# Patient Record
Sex: Female | Born: 1945 | Race: Black or African American | Hispanic: No | Marital: Married | Smoking: Former smoker
Health system: Southern US, Community
[De-identification: ages and names within clinical notes are randomized; demographics above are authoritative.]

## PROBLEM LIST (undated history)

## (undated) DIAGNOSIS — M5136 Other intervertebral disc degeneration, lumbar region: Secondary | ICD-10-CM

## (undated) DIAGNOSIS — M19042 Primary osteoarthritis, left hand: Secondary | ICD-10-CM

## (undated) DIAGNOSIS — T4145XA Adverse effect of unspecified anesthetic, initial encounter: Secondary | ICD-10-CM

## (undated) DIAGNOSIS — H05049 Tenonitis of unspecified orbit: Secondary | ICD-10-CM

## (undated) DIAGNOSIS — M7989 Other specified soft tissue disorders: Secondary | ICD-10-CM

## (undated) DIAGNOSIS — M19041 Primary osteoarthritis, right hand: Secondary | ICD-10-CM

## (undated) DIAGNOSIS — M797 Fibromyalgia: Secondary | ICD-10-CM

## (undated) DIAGNOSIS — R238 Other skin changes: Secondary | ICD-10-CM

## (undated) DIAGNOSIS — M7918 Myalgia, other site: Secondary | ICD-10-CM

## (undated) DIAGNOSIS — M1990 Unspecified osteoarthritis, unspecified site: Secondary | ICD-10-CM

## (undated) DIAGNOSIS — K219 Gastro-esophageal reflux disease without esophagitis: Secondary | ICD-10-CM

## (undated) DIAGNOSIS — F419 Anxiety disorder, unspecified: Secondary | ICD-10-CM

## (undated) DIAGNOSIS — J45909 Unspecified asthma, uncomplicated: Secondary | ICD-10-CM

## (undated) DIAGNOSIS — R7303 Prediabetes: Secondary | ICD-10-CM

## (undated) DIAGNOSIS — I10 Essential (primary) hypertension: Secondary | ICD-10-CM

## (undated) DIAGNOSIS — J302 Other seasonal allergic rhinitis: Secondary | ICD-10-CM

## (undated) DIAGNOSIS — M3500 Sicca syndrome, unspecified: Secondary | ICD-10-CM

## (undated) DIAGNOSIS — T8859XA Other complications of anesthesia, initial encounter: Secondary | ICD-10-CM

## (undated) DIAGNOSIS — R233 Spontaneous ecchymoses: Secondary | ICD-10-CM

## (undated) DIAGNOSIS — D649 Anemia, unspecified: Secondary | ICD-10-CM

## (undated) DIAGNOSIS — R0989 Other specified symptoms and signs involving the circulatory and respiratory systems: Secondary | ICD-10-CM

## (undated) DIAGNOSIS — R32 Unspecified urinary incontinence: Secondary | ICD-10-CM

## (undated) DIAGNOSIS — M170 Bilateral primary osteoarthritis of knee: Secondary | ICD-10-CM

## (undated) HISTORY — DX: Other skin changes: R23.8

## (undated) HISTORY — DX: Other seasonal allergic rhinitis: J30.2

## (undated) HISTORY — DX: Other specified soft tissue disorders: M79.89

## (undated) HISTORY — DX: Unspecified asthma, uncomplicated: J45.909

## (undated) HISTORY — DX: Other intervertebral disc degeneration, lumbar region: M51.36

## (undated) HISTORY — DX: Myalgia, other site: M79.18

## (undated) HISTORY — DX: Anxiety disorder, unspecified: F41.9

## (undated) HISTORY — DX: Primary osteoarthritis, right hand: M19.041

## (undated) HISTORY — DX: Tenonitis of unspecified orbit: H05.049

## (undated) HISTORY — PX: BACK SURGERY: SHX140

## (undated) HISTORY — PX: BREAST SURGERY: SHX581

## (undated) HISTORY — DX: Bilateral primary osteoarthritis of knee: M17.0

## (undated) HISTORY — DX: Other specified symptoms and signs involving the circulatory and respiratory systems: R09.89

## (undated) HISTORY — PX: SHOULDER SURGERY: SHX246

## (undated) HISTORY — DX: Spontaneous ecchymoses: R23.3

## (undated) HISTORY — DX: Primary osteoarthritis, left hand: M19.042

## (undated) HISTORY — DX: Essential (primary) hypertension: I10

## (undated) HISTORY — PX: BREAST CYST ASPIRATION: SHX578

## (undated) HISTORY — PX: ABDOMINAL HYSTERECTOMY: SHX81

---
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
## 1983-07-24 HISTORY — PX: BACK SURGERY: SHX140

## 1984-07-23 HISTORY — PX: ABDOMINAL HYSTERECTOMY: SHX81

## 1989-07-23 HISTORY — PX: KNEE SURGERY: SHX244

## 1999-07-24 HISTORY — PX: CARPAL TUNNEL RELEASE: SHX101

## 2000-09-19 ENCOUNTER — Encounter: Payer: Self-pay | Admitting: Family Medicine

## 2000-09-19 ENCOUNTER — Encounter: Admission: RE | Admit: 2000-09-19 | Discharge: 2000-09-19 | Payer: Self-pay | Admitting: Family Medicine

## 2001-11-19 ENCOUNTER — Encounter: Payer: Self-pay | Admitting: Obstetrics and Gynecology

## 2001-11-19 ENCOUNTER — Encounter: Admission: RE | Admit: 2001-11-19 | Discharge: 2001-11-19 | Payer: Self-pay | Admitting: Obstetrics and Gynecology

## 2002-01-06 ENCOUNTER — Other Ambulatory Visit: Admission: RE | Admit: 2002-01-06 | Discharge: 2002-01-06 | Payer: Self-pay | Admitting: Obstetrics and Gynecology

## 2002-01-29 ENCOUNTER — Encounter: Payer: Self-pay | Admitting: Family Medicine

## 2002-01-29 ENCOUNTER — Encounter: Admission: RE | Admit: 2002-01-29 | Discharge: 2002-01-29 | Payer: Self-pay | Admitting: Family Medicine

## 2003-01-20 ENCOUNTER — Encounter: Payer: Self-pay | Admitting: Family Medicine

## 2003-01-20 ENCOUNTER — Encounter: Admission: RE | Admit: 2003-01-20 | Discharge: 2003-01-20 | Payer: Self-pay | Admitting: Family Medicine

## 2003-02-09 ENCOUNTER — Other Ambulatory Visit: Admission: RE | Admit: 2003-02-09 | Discharge: 2003-02-09 | Payer: Self-pay | Admitting: Obstetrics and Gynecology

## 2004-02-08 ENCOUNTER — Encounter: Admission: RE | Admit: 2004-02-08 | Discharge: 2004-02-08 | Payer: Self-pay | Admitting: Family Medicine

## 2004-07-23 HISTORY — PX: TOTAL HIP ARTHROPLASTY: SHX124

## 2005-02-08 ENCOUNTER — Encounter: Admission: RE | Admit: 2005-02-08 | Discharge: 2005-02-08 | Payer: Self-pay | Admitting: Obstetrics and Gynecology

## 2005-06-08 ENCOUNTER — Ambulatory Visit (HOSPITAL_COMMUNITY): Admission: RE | Admit: 2005-06-08 | Discharge: 2005-06-08 | Payer: Self-pay | Admitting: Internal Medicine

## 2005-09-03 ENCOUNTER — Emergency Department (HOSPITAL_COMMUNITY): Admission: EM | Admit: 2005-09-03 | Discharge: 2005-09-04 | Payer: Self-pay | Admitting: Emergency Medicine

## 2005-09-03 ENCOUNTER — Encounter: Admission: RE | Admit: 2005-09-03 | Discharge: 2005-09-03 | Payer: Self-pay | Admitting: Orthopaedic Surgery

## 2006-01-30 ENCOUNTER — Ambulatory Visit (HOSPITAL_COMMUNITY): Admission: RE | Admit: 2006-01-30 | Discharge: 2006-01-30 | Payer: Self-pay | Admitting: Internal Medicine

## 2006-04-02 ENCOUNTER — Encounter: Admission: RE | Admit: 2006-04-02 | Discharge: 2006-04-02 | Payer: Self-pay | Admitting: Internal Medicine

## 2006-04-16 ENCOUNTER — Inpatient Hospital Stay (HOSPITAL_COMMUNITY): Admission: RE | Admit: 2006-04-16 | Discharge: 2006-04-19 | Payer: Self-pay | Admitting: Orthopaedic Surgery

## 2007-04-15 ENCOUNTER — Encounter: Admission: RE | Admit: 2007-04-15 | Discharge: 2007-04-15 | Payer: Self-pay | Admitting: Emergency Medicine

## 2007-05-01 ENCOUNTER — Encounter: Admission: RE | Admit: 2007-05-01 | Discharge: 2007-05-01 | Payer: Self-pay | Admitting: Emergency Medicine

## 2008-05-14 ENCOUNTER — Encounter: Admission: RE | Admit: 2008-05-14 | Discharge: 2008-05-14 | Payer: Self-pay | Admitting: Emergency Medicine

## 2009-05-16 ENCOUNTER — Encounter: Admission: RE | Admit: 2009-05-16 | Discharge: 2009-05-16 | Payer: Self-pay | Admitting: Internal Medicine

## 2009-06-20 ENCOUNTER — Other Ambulatory Visit: Admission: RE | Admit: 2009-06-20 | Discharge: 2009-06-20 | Payer: Self-pay | Admitting: Internal Medicine

## 2010-04-11 ENCOUNTER — Encounter: Admission: RE | Admit: 2010-04-11 | Discharge: 2010-04-11 | Payer: Self-pay | Admitting: Internal Medicine

## 2010-06-07 ENCOUNTER — Encounter: Admission: RE | Admit: 2010-06-07 | Discharge: 2010-06-07 | Payer: Self-pay | Admitting: Internal Medicine

## 2010-08-13 ENCOUNTER — Encounter: Payer: Self-pay | Admitting: Emergency Medicine

## 2010-08-14 ENCOUNTER — Encounter: Payer: Self-pay | Admitting: Emergency Medicine

## 2010-12-08 NOTE — Op Note (Signed)
Kathy Duran, Kathy Duran                ACCOUNT NO.:  [id number]   MEDICAL RECORD NO.:  [id number]          PATIENT TYPE:  INP   LOCATION:  5011                         FACILITY:  [facility]   PHYSICIAN:  Claude Manges. Whitfield, M.D.DATE OF BIRTH:  08/07/1945   DATE OF PROCEDURE:  04/16/2006  DATE OF DISCHARGE:                                 OPERATIVE REPORT   PREOPERATIVE DIAGNOSIS:  Osteoarthritis, right hip.   POSTOPERATIVE DIAGNOSIS:  Osteoarthritis, right hip.   PROCEDURE:  Right total hip replacement.   SURGEON:  Claude Manges. Cleophas Dunker, M.D.   ASSISTANT:  Arnoldo Morale, [facility]   ANESTHESIA:  General orotracheal.   COMPLICATIONS:  None.   COMPONENTS:  DePuy AML 52 mm outer diameter, 100 series, metallic acetabular  cup with Apex hole eliminator, the Marathon polyethylene liner, 32 mm outer  diameter hip ball with a +1 mm neck length and a small stature 12 mm femoral  component.  All were press fit.   PROCEDURE:  With the patient comfortable on the operating table and under  general orotracheal anesthesia, nursing staff inserted a Foley catheter.  The patient was then placed in the lateral decubitus position with the right  side up and secured to the operating table with the Innomed hip system.   The right hip was then prepped with Betadine scrub and DuraPrep from the  iliac crest to the midcalf.  Sterile draping was performed.   A routine Southern incision was utilized and via sharp dissection, carried  down subcutaneous tissue. There was abundant adipose tissue which made the  procedure technically difficult.  There was probably least 4 inches of  adipose before we encountered the iliotibial band.  Self-retaining  retractors were inserted.  Gross bleeders were Bovie coagulated.  Iliotibial  band was then incised along the length of the skin incision.  Retractors  were placed more deeply.   The short external rotators were identified.  They were carefully incised  from their posterior  attachment of the greater trochanter.  Tendinous  structures were tagged with 0 Ethibond suture.  They were retracted from the  capsule which was then easily visualized.  The capsule was incised along the  femoral neck and head with probably 4-5 mL of clear yellow joint effusion.   The head was then dislocated posteriorly.  There were large areas of  articular cartilage loss.  On x-ray there was some subchondral cysts.   Using the AML guide the femoral neck was then osteotomized with oscillating  saw.  The central hole was then made in the piriformis fossa.  Reaming was  performed to 11.5 mm to accept a 12 mm prosthesis.  Rasping was performed  using a 10.5 then a 12 mm rasp. A calcar reamer was used to obtain the  appropriate angle on the calcar.  The level of the osteotomy was  approximately a fingerbreadth above the lesser trochanter.  We had a very  nice fit with the broach fitting flush on the calcar.   Retractor was then placed about the acetabulum.  The labrum was sharply  excised.  There were  thin osteophytes circumferentially.  They were loose  and these were removed.  Reaming was performed sequentially to 51 mm to  accept a 52 mm prosthesis.  We had very nice bleeding circumferentially  within the acetabulum.  I trialed a 50 mm prosthesis.  I had excellent rim  fit with seat.   We accordingly, utilized a 52 mm outer diameter 100 series AML acetabular  component.  We impacted it flush into the acetabulum.  We then inserted the  trial polyethylene liner.  The femoral broach was then inserted followed by  the 32 mm hip ball with a +1 neck.  The entire construct was reduced with  excellent motion, there was no subluxation in flexion extension, internal,  external rotation.  I felt the leg lengths were symmetrical.   Trial components were removed.  The joint was then copiously irrigated with  saline solution.  The apex hole eliminator was inserted followed by the  final Marathon  polyethylene liner.  The femoral canal was then irrigated.  The 12 mm femoral component was then impacted onto the calcar.  The Morse  taper neck was cleaned and the +1 neck length 32-mm hip ball was then  applied.  The acetabulum was inspected, it was clear of any loose material.  The head was then reduced and again through a full range of motion there was  no instability and leg lengths appeared to be symmetrical.   Wound was again irrigated with saline solution.  The capsule was closed with  0 Ethibond.  The short external rotators closed with a similar material.  The iliotibial band was closed with running 0 Vicryl.  We closed the subcu  with numerous layers of 0 and 2-0 Vicryl, skin closed with skin clips.  Sterile bulky dressing was applied.  The patient was then placed supine on  the operating room stretcher, knee immobilizer was placed on the right lower  extremity.  There was excellent pulses.   The patient tolerated without complications.      Claude Manges. Cleophas Dunker, M.D.  Electronically Signed     PWW/MEDQ  D:  04/16/2006  T:  04/18/2006  Job:  [id number]

## 2010-12-08 NOTE — H&P (Signed)
Kathy Duran, Kathy Duran                ACCOUNT NO.:  [id number]   MEDICAL RECORD NO.:  [id number]          PATIENT TYPE:  INP   LOCATION:  NA                           FACILITY:  [facility]   PHYSICIAN:  Claude Manges. Whitfield, M.D.DATE OF BIRTH:  [birth year]   DATE OF ADMISSION:  DATE OF DISCHARGE:                                HISTORY & PHYSICAL   CHIEF COMPLAINT:  Right hip pain.   HISTORY OF THE PRESENT ILLNESS:  Kathy Duran is a 65 year old African-  American female with right hip pain for two years with no known injury.  The  pain is now described as intermittent, moderate-to-severe pain in the right  lateral aspect of the hip and in the groin.  There is no radiation up or  down the leg.  The patient has failed conservative treatment, which included  intra-articular injection to the right hip.  She uses pain medication on an  as needed basis.  Mechanical symptoms __________  for give away due pain  with prolonged walking.  X-rays of the right hip shows decreased joint space  in the femoral and acetabular aspects of the right hip.   ALLERGIES:  The patient has DRUG ALLERGY TO SULFA, WHICH CAUSES A RASH, E-  MYCIN INTOLERANCE CAUSES UPSET STOMACH, CIPRO CAUSES A RASH. MOBIC AND  ARTHROTEC BOTH CAUSE A RASH, AND VIOXX CAUSES GASTROINTESTINAL UPSET.  Otherwise there are no metal or food allergies noted.   MEDICATIONS:  1. Zocor 40 mg 1 daily.  2. Cozaar 50 mg 1 daily.  3. Aspirin 81 mg 1 daily; stopped on April 10, 2006.  4. Advair 100/50 twice a day.  5. Nasonex two puffs once daily.  6. Albuterol inhaler 90 mEq as needed.  7. Villadot 0.1 mg twice weekly.  8. Singulair 10 mg once daily.  9. Pepcid 40 mg 1 daily up to twice daily p.r.n.  10.Claritin 1 daily up to twice daily.  11.The patient was placed on Toprol, she is unsure of the dosage, in the      perioperative period.   PAST SURGICAL HISTORY:  1. Hysterectomy in 1986.  2. In 1987 hemilaminectomy by Dr. Cleophas Dunker of the  lumbar spine.  3. Removal of a cyst from the right ovary in 1994.  4. In 1996 right knee arthroscopy by Dr. Cleophas Dunker.  5. In 2001 the patient underwent a right shoulder arthroscopy with      subacromial decompression, distal clavicle resection and repair of      anterior glenoid labral tear.   The patient denies any complications with the above procedures and has had  blood transfusions.   SOCIAL HISTORY:  The patient has a remote history of smoking; she stopped  smoking 38 years ago.  No alcohol use.  She lives in a [place] resident  with two steps to the regular entrance. The patient lives with one other  person.   PRIMARY CARE PHYSICIAN:  Margaretmary Bayley, M.D.   PRIMARY CARDIOLOGIST:  Cristy Hilts. Jacinto Halim, M.D.   PULMONOLOGIST:  Rennis Chris. Maple Hudson, M.D., [facility], FACP   FAMILY HISTORY:  The patient's mother,  age 51, has a history of an  myocardial infarction, hypertension, diabetes, and history of a hip  replacement with postoperative myocardial infarction.  Father is age 54 and  has hypertension and diabetes.  She has one brother who is deceased at age  31 due to emphysema, lung disease,  and had a myocardial infarction and this  is felt to be the cause of his demise.  He was reportedly exposed to agent  orange and had multiple medical issues related to this exposure.  She has  one sister who is age 26 and has hypertension and diabetes.   REVIEW OF SYSTEMS:  The patient has hoarseness, which she relates to her  allergies.  Her shortness of breath is well controlled.  She has a history  of bronchitis, but this is well-controlled.  Her hypertension is well  controlled.  She suffers from gastric reflux, but this is controlled with  medications.  She has a history of spastic colon, which is remote.  The  patient has asthma that is well controlled.  She bruises easily and has skin  rashes periodically secondary to certain foods, and sun exposure, which  causes skin rashes secondary to her  medications.  Otherwise the review of  systems if negative or noncontributory.  The patient denies any recent flu  or fever.  No chest pain or shortness of breath.   PHYSICAL EXAMINATION:  GENERAL APPEARANCE: The patient is a well-developed,  well-nourished female in no acute distress.  She walks without any assistive  devices.  She has a slight antalgic gait and a limp on the right.  The  patient's mood and affect are appropriate.  She talks easily with the  examiner.  HEART:  Cardiac shows regular rate and rhythm.  No murmurs, rubs or gallops  noted.  CHEST AND LUNGS:  The chest is clear to auscultation bilaterally.  No  wheezing, rhonchi or rales  noted.  ABDOMEN:  The abdomen is soft and nontender with bowel sounds times four  quadrants.  No hepatomegaly and no splenomegaly.  HEENT:  The head is normocephalic. There is no frontal or maxillary  tenderness to palpation.  Conjunctivae are pink.  Sclerae is nonicteric.  Pupils equal, round and react to light and accommodation.  Extraocular  muscles are intact.  No visible external ear deformities.  Tympanic  membranes are pearly and gray bilaterally.  Nose; nasal septum is midline.  Nasal mucosa is pink and moist without polyps.  Buccal mucosa is pink and  moist.  The patient has good dentition.  Pharynx is without erythema or  exudate.  Tongue and uvula are midline.  NECK:  The neck has no lymphadenopathy.  Carotids are 2+ and without bruits.  Trachea is midline.  No tenderness with palpation along the cervical spine.  The patient has full range of motion of the cervical spine without pain or  radicular symptoms.  BACK:  The back has no tenderness to palpation over the thoracic or lumbar  spine.  BREASTS, GENITALIA AND RECTAL:  The breast, genital and rectal examinations  deferred at the present time.  NEUROLOGIC EXAMINATION:  Neurologically the patient is alert and oriented times three.  Cranial nerves II-XII are grossly intact.   Lower extremity  testing reveals 5/5 strength throughout.  MUSCULOSKELETAL:  The upper extremities are equal and symmetric in size and  shape.  The patient has from in her shoulders, elbows, wrists, and hands are  without pain.  Left radial pulses are 2+  and right radial pulses are 1+.  Now __________  capillary refill is less than two seconds bilaterally.  EXTREMITIES:  Lower extremities; the patient has full range of motion of the  left hip.  The right hip has limited range of motion with zero degrees of  internal rotation and external rotation is 20 degrees.  She has flexion of  both hips to 90 degrees, although it is somewhat painful for her to flex the  right hip to 90 degrees.  She has full range of motion of both knees.  No  effusion or edema is noted in either knee.  Calves are nontender  bilaterally.  Dorsalis pedis pulses are 2+ bilaterally and she has good  sensation to the toes bilaterally throughout.   ANCILLARY DATA:  X-rays of the right hip showed decreased joint space with  cystic formation in both the femoral and acetabular aspects of the hip.   IMPRESSION:  1. Right hip osteoarthritis.  2. Hypertension.  3. Hyperlipidemia.  4. Gastroesophageal reflux disease.  5. Minimal obstructive airway disease.  6. Seasonal allergies.   PLAN:  The patient is to undergo preoperative laboratories and testing prior  to surgery.  The patient did undergo a cardiac workup by Dr. Yates Decamp of  the [facility], and was cleared for surgery from  a cardiac standpoint.  The patient was also seen by Dr. Fannie Knee to  assess her pulmonary status prior to surgery and she was cleared for surgery  from a pulmonary standpoint.   The patient is to undergo a right total hip arthroplasty on April 15, 2006 by Dr. Norlene Campbell.      Richardean Canal, P.A.      Claude Manges. Cleophas Dunker, M.D.  Electronically Signed    GC/MEDQ  D:  04/10/2006  T:  04/12/2006   Job:  [id number]   cc:   Margaretmary Bayley, M.D.

## 2010-12-08 NOTE — Discharge Summary (Signed)
NAMEMARCY, Duran                ACCOUNT NO.:  [id number]   MEDICAL RECORD NO.:  [id number]          PATIENT TYPE:  INP   LOCATION:  5011                         FACILITY:  [facility]   PHYSICIAN:  Kathy Duran, P.A.-C.DATE OF BIRTH:  02-Jul-1946   DATE OF ADMISSION:  04/16/2006  DATE OF DISCHARGE:  04/19/2006                                 DISCHARGE SUMMARY   ADMISSION DIAGNOSIS:  Osteoarthritis of the right hip.   DISCHARGE DIAGNOSES:  1. Osteoarthritis of the right hip.  2. History of hypertension.  3. Hyperlipidemia.  4. Gastroesophageal reflux disease.  5. Minimal obstructive airway disease.  6. Allergic rhinitis.   PROCEDURE:  Right total hip arthroplasty.   HISTORY:  Kathy Duran is a 65 year old African American female with right hip  pain for 2 years without a history of injury or trauma.  The pain is now  described as an intermittent pain, which is moderate to severe in the right  lateral aspect of the hip and in the groin.  There is no radiation up or  down the leg.  The patient has failed conservative treatment, which included  intraarticular injections to the right hip and the use of pain medicine on a  p.r.n. basis.  She has mechanical symptoms with some giving way due to pain  with prolonged walking.  X-rays revealed decreased joint space in the  femoral acetabular aspect of the right hip.  She is indicated now for a  right total hip arthroplasty.   HOSPITAL COURSE:  A 65 year old female admitted on April 16, 2006 after  appropriate laboratory studies were obtained, as well as 1 gram Ancef IV on  call to the operating room.  She was taken to the operating room, where she  underwent a right total hip arthroplasty by Dr. Cleophas Dunker with Arnoldo Morale  P.A.-C assisting.  She tolerated the procedure well.  She was continued  postoperatively on Ancef 1 gram IV q.8 h. x3 doses.  A PCA pump was used  with Dilaudid in a full-dose protocol.  Heparin 3000 units subcu was  started  q.12 h. until the Coumadin became therapeutic.  A Foley was placed  intraoperatively.  Consultations with PT, OT, and care management were made.  She may be partial weightbearing 50% of body weight.  She was allowed out of  bed to a chair the following day.  She was transfused 1 unit of packed cells  during her initial postop course.  On April 17, 2006, she was weaned to  oral pain meds and the PCA was discontinued.  Incentive spirometry was began  at q.1 h.  Saline lock to the IV once the block was given on April 17, 2006.  On April 18, 2006, her Foley was discontinued.  Chest x-ray was  ordered because of temperature elevation.  Respiratory therapy for pulmonary  toilet.  Albuterol nebulizer q.6 h. x24.  Knee immobilizer is used only when  in bed.  She was allowed to use her own Advair.  A urine for UA was obtained  on April 18, 2006 also.  The remainder  of her hospital course was  uneventful.  Her INR became prophylactic and her heparin was discontinued on  April 19, 2006.  A chest x-ray was ordered on April 19, 2006 also.  She did well, became afebrile, and was discharged on April 19, 2006 to  return back to the office in 10-14 days for recheck evaluation.   LABORATORY DATA:  Radiographic studies of April 16, 2006 reveal a right  hip with total hip arthroplasty without complications.  Chest x-ray of  April 18, 2006 reveals no acute disease.  Chest x-ray of April 19, 2006, again, no active pulmonary disease.  Hemoglobin 13.5; hematocrit 39.7;  white count 5,200; platelets 276,000.  Discharge hemoglobin 9.3; hematocrit  26.5%; white count 7,000; platelets 479,000.  INR at the time of discharge  was 2.0.  Electrolytes revealed sodium 141, potassium 3.9, chloride 107, CO2  28, glucose 85, BUN 16, creatinine 0.9, calcium 9.3, total protein 6.4,  albumin 3.8, AST 22, ALT 23, ALP 57, total bilirubin 0.6.  This was  preoperatively.  Discharge  sodium 140, potassium 3.6, chloride 104, CO2 29,  glucose 100, BUN 3, creatinine 0.7, calcium 8.5.  Urinalysis on April 11, 2006 revealed benign and on April 18, 2006 was also benign.  Blood  type is 0 positive.  Antibody screen negative.  Given 1 unit of packed  cells.  Urine culture of April 11, 2006 showed insignificant growth.  Culture of April 18, 2006 revealed greater than 100,000 colonies of  proteus mirabilis and E. coli.   DISCHARGE INSTRUCTIONS:  There is no restriction in diet.  Activity as  tolerated.  No driving for 6 weeks or lifting for 6 weeks.  Ambulate with a  walker 50% weightbearing on her leg.  No shower.  May bath on Friday.  Keep  wound clean and dry.  Change dressing daily.  Percocet 5/325 mg 1-2 tablets  every 4 hours as needed for pain, Coumadin 5 mg instructed __________ the  patient to see pharmacist, Coreg 10 mg twice a day.  Follow up with Dr.  Cleophas Dunker 2 weeks postop.  Follow the instruction sheet and take her  temperature 4 times a day.  Call if greater than 100.5.  She was discharged  in improved condition.      Kathy Drone Duran, P.A.-C.     BDP/MEDQ  D:  05/08/2006  T:  05/09/2006  Job:  [id number]

## 2011-06-05 ENCOUNTER — Other Ambulatory Visit: Payer: Self-pay | Admitting: Internal Medicine

## 2011-06-05 DIAGNOSIS — Z1231 Encounter for screening mammogram for malignant neoplasm of breast: Secondary | ICD-10-CM

## 2011-06-13 ENCOUNTER — Ambulatory Visit
Admission: RE | Admit: 2011-06-13 | Discharge: 2011-06-13 | Disposition: A | Discharge status: Home / Self Care | Payer: BC Managed Care – PPO | Source: Ambulatory Visit | Attending: Internal Medicine | Admitting: Internal Medicine

## 2011-06-13 ENCOUNTER — Other Ambulatory Visit: Payer: Self-pay | Admitting: Internal Medicine

## 2011-06-13 DIAGNOSIS — R109 Unspecified abdominal pain: Secondary | ICD-10-CM

## 2011-07-03 ENCOUNTER — Ambulatory Visit: Payer: Self-pay

## 2011-07-03 ENCOUNTER — Ambulatory Visit
Admission: RE | Admit: 2011-07-03 | Discharge: 2011-07-03 | Disposition: A | Discharge status: Home / Self Care | Payer: BC Managed Care – PPO | Source: Ambulatory Visit | Attending: Internal Medicine | Admitting: Internal Medicine

## 2011-07-03 DIAGNOSIS — Z1231 Encounter for screening mammogram for malignant neoplasm of breast: Secondary | ICD-10-CM

## 2011-12-26 ENCOUNTER — Other Ambulatory Visit (HOSPITAL_COMMUNITY): Payer: Self-pay | Admitting: Internal Medicine

## 2011-12-26 DIAGNOSIS — J45909 Unspecified asthma, uncomplicated: Secondary | ICD-10-CM

## 2012-01-02 ENCOUNTER — Ambulatory Visit (HOSPITAL_COMMUNITY)
Admission: RE | Admit: 2012-01-02 | Discharge: 2012-01-02 | Disposition: A | Discharge status: Home / Self Care | Payer: BC Managed Care – PPO | Source: Ambulatory Visit | Attending: Internal Medicine | Admitting: Internal Medicine

## 2012-01-02 DIAGNOSIS — J45909 Unspecified asthma, uncomplicated: Secondary | ICD-10-CM | POA: Insufficient documentation

## 2012-01-02 MED ORDER — ALBUTEROL SULFATE (5 MG/ML) 0.5% IN NEBU
2.5000 mg | INHALATION_SOLUTION | Freq: Once | RESPIRATORY_TRACT | Status: AC
  Administered 2012-01-02: 2.5 mg via RESPIRATORY_TRACT

## 2012-06-10 ENCOUNTER — Other Ambulatory Visit: Payer: Self-pay | Admitting: Internal Medicine

## 2012-06-10 DIAGNOSIS — Z1231 Encounter for screening mammogram for malignant neoplasm of breast: Secondary | ICD-10-CM

## 2012-07-18 ENCOUNTER — Ambulatory Visit
Admission: RE | Admit: 2012-07-18 | Discharge: 2012-07-18 | Disposition: A | Discharge status: Home / Self Care | Payer: Self-pay | Source: Ambulatory Visit | Attending: Internal Medicine | Admitting: Internal Medicine

## 2012-07-18 ENCOUNTER — Ambulatory Visit: Payer: BC Managed Care – PPO

## 2012-07-18 DIAGNOSIS — Z1231 Encounter for screening mammogram for malignant neoplasm of breast: Secondary | ICD-10-CM

## 2012-07-28 ENCOUNTER — Other Ambulatory Visit: Payer: Self-pay | Admitting: Internal Medicine

## 2012-07-28 DIAGNOSIS — R928 Other abnormal and inconclusive findings on diagnostic imaging of breast: Secondary | ICD-10-CM

## 2012-08-01 ENCOUNTER — Ambulatory Visit
Admission: RE | Admit: 2012-08-01 | Discharge: 2012-08-01 | Disposition: A | Discharge status: Home / Self Care | Payer: BC Managed Care – PPO | Source: Ambulatory Visit | Attending: Internal Medicine | Admitting: Internal Medicine

## 2012-08-01 DIAGNOSIS — R928 Other abnormal and inconclusive findings on diagnostic imaging of breast: Secondary | ICD-10-CM

## 2013-05-06 ENCOUNTER — Ambulatory Visit (INDEPENDENT_AMBULATORY_CARE_PROVIDER_SITE_OTHER): Payer: Medicare Other

## 2013-05-06 ENCOUNTER — Encounter: Payer: Self-pay | Admitting: Podiatry

## 2013-05-06 ENCOUNTER — Ambulatory Visit (INDEPENDENT_AMBULATORY_CARE_PROVIDER_SITE_OTHER): Payer: Medicare Other | Admitting: Podiatry

## 2013-05-06 VITALS — BP 147/84 | HR 92 | Resp 17 | Wt 195.0 lb

## 2013-05-06 DIAGNOSIS — G8929 Other chronic pain: Secondary | ICD-10-CM

## 2013-05-06 DIAGNOSIS — M79609 Pain in unspecified limb: Secondary | ICD-10-CM

## 2013-05-06 DIAGNOSIS — M779 Enthesopathy, unspecified: Secondary | ICD-10-CM

## 2013-05-06 DIAGNOSIS — R238 Other skin changes: Secondary | ICD-10-CM | POA: Insufficient documentation

## 2013-05-06 DIAGNOSIS — M7989 Other specified soft tissue disorders: Secondary | ICD-10-CM | POA: Insufficient documentation

## 2013-05-06 DIAGNOSIS — M2040 Other hammer toe(s) (acquired), unspecified foot: Secondary | ICD-10-CM

## 2013-05-06 DIAGNOSIS — M2010 Hallux valgus (acquired), unspecified foot: Secondary | ICD-10-CM

## 2013-05-06 MED ORDER — TRIAMCINOLONE ACETONIDE 10 MG/ML IJ SUSP
5.0000 mg | Freq: Once | INTRAMUSCULAR | Status: AC
  Administered 2013-05-06: 5 mg via INTRA_ARTICULAR

## 2013-05-06 NOTE — Progress Notes (Signed)
  Subjective:    Patient ID: Kathy Duran, female    DOB: August 19, 1945, 67 y.o.   MRN: [id number]  HPIN L FOOT CRAMPING       L L 2ND MPJ AREAR       D 4 MONTHS       O GRADUAL       C SHARP INITIALLY, BUT EASES AS WALK       A  INITIAL WALKING       T PRESCRIPTION ORTHOTICS 2006, CHANGE SHOE STYLE, WATER MASSAGE    Review of Systems  Constitutional: Positive for activity change.  HENT: Negative.   Eyes: Negative.   Respiratory: Positive for chest tightness and wheezing.   Cardiovascular: Positive for leg swelling.  Gastrointestinal: Negative.   Endocrine: Negative.   Genitourinary: Negative.   Musculoskeletal: Negative.   Skin: Negative.   Allergic/Immunologic: Positive for environmental allergies.  Neurological: Negative.   Hematological: Negative.   Psychiatric/Behavioral: Negative.        Objective:   Physical Exam        Assessment & Plan:

## 2013-05-06 NOTE — Patient Instructions (Signed)
We will call when orthotics are ready 

## 2013-05-07 ENCOUNTER — Ambulatory Visit: Payer: Self-pay | Admitting: Podiatry

## 2013-05-07 NOTE — Progress Notes (Signed)
Subjective:     Patient ID: Kathy Duran, female   DOB: 08-Dec-1945, 67 y.o.   MRN: [id number]  Toe Pain    patient presents complaining of pain in her forefoot left of several months duration. States it's worse when she steps down on it or after walking for a period of time   Review of Systems  All other systems reviewed and are negative.       Objective:   Physical Exam  Nursing note and vitals reviewed. Constitutional: She appears well-developed and well-nourished.  Cardiovascular: Intact distal pulses.   Musculoskeletal: Normal range of motion.  Neurological: She is alert.  Skin: Skin is warm.   patient has normal muscle strength and is noted to have forefoot structural changes left with elevation of the second toe and structural bunion deformity noted. Acute inflammation and pain around the second MPJ left. 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 Explained structural malalignment and today focused on the second MPJ joint. Today proximal nerve block and then aspirated the joint getting out a small amount of fluid I then went ahead and injected with half cc of dexamethasone Kenalog combination and applied thick plantar padding. Scanned for custom orthotics to reduce forefoot stresses and provide better arch support

## 2013-06-03 ENCOUNTER — Ambulatory Visit (INDEPENDENT_AMBULATORY_CARE_PROVIDER_SITE_OTHER): Payer: Medicare Other | Admitting: Podiatry

## 2013-06-03 ENCOUNTER — Encounter: Payer: Self-pay | Admitting: Podiatry

## 2013-06-03 VITALS — BP 120/80 | HR 114 | Resp 16

## 2013-06-03 DIAGNOSIS — M779 Enthesopathy, unspecified: Secondary | ICD-10-CM

## 2013-06-03 DIAGNOSIS — M2040 Other hammer toe(s) (acquired), unspecified foot: Secondary | ICD-10-CM

## 2013-06-03 DIAGNOSIS — M2010 Hallux valgus (acquired), unspecified foot: Secondary | ICD-10-CM

## 2013-06-03 NOTE — Progress Notes (Signed)
Subjective:     Patient ID: Kathy Duran, female   DOB: 1946-02-27, 67 y.o.   MRN: [id number]  HPI patient presents stating my toe is feeling better I still have some pain. Presents with old orthotics and comes today to pick up a new orthotic devices.    Review of Systems     Objective:   Physical Exam  Nursing note and vitals reviewed. Constitutional: She is oriented to person, place, and time.  Cardiovascular: Intact distal pulses.   Musculoskeletal: Normal range of motion.  Neurological: She is oriented to person, place, and time.   patient does have structural imbalance left foot is continued prominence of the second metatarsal head and mild inflammation and discomfort when the second MPJ is palpated    Assessment:     Improving capsulitis left still present was structural HAV hammertoe deformity    Plan:     Orthotics dispensed with instructions and discussed possible re\re doing her second pair which she will bring with her in 4 weeks and we will make that decision

## 2013-06-03 NOTE — Patient Instructions (Signed)

## 2013-06-30 ENCOUNTER — Other Ambulatory Visit: Payer: Self-pay

## 2013-06-30 DIAGNOSIS — Z1231 Encounter for screening mammogram for malignant neoplasm of breast: Secondary | ICD-10-CM

## 2013-07-02 ENCOUNTER — Ambulatory Visit: Payer: Medicare Other | Admitting: Podiatry

## 2013-07-08 ENCOUNTER — Ambulatory Visit: Payer: Medicare Other | Admitting: Podiatry

## 2013-08-03 ENCOUNTER — Ambulatory Visit: Payer: BC Managed Care – PPO

## 2013-08-26 ENCOUNTER — Ambulatory Visit (INDEPENDENT_AMBULATORY_CARE_PROVIDER_SITE_OTHER): Payer: Medicare Other | Admitting: Podiatry

## 2013-08-26 ENCOUNTER — Encounter: Payer: Self-pay | Admitting: Podiatry

## 2013-08-26 VITALS — BP 131/76 | HR 99 | Resp 17 | Ht 65.0 in | Wt 198.0 lb

## 2013-08-26 DIAGNOSIS — M7750 Other enthesopathy of unspecified foot: Secondary | ICD-10-CM

## 2013-08-26 NOTE — Progress Notes (Signed)
Pt states it's like the orthotics are changing my gait and the weight is put on my left groin area.

## 2013-08-27 NOTE — Progress Notes (Signed)
Subjective:     Patient ID: Kathy Duran, female   DOB: 1945/12/25, 68 y.o.   MRN: [id number]  HPI patient states that the orthotics feel good but she feels like it may be putting some stress into her left groin area   Review of Systems     Objective:   Physical Exam Neurovascular status intact with no health history issues noted and well fitted orthotics that are actually less rigid than her previous pair    Assessment:     May be developing mild tendinitis symptoms or it may be coincidental is the pain in her left groin    Plan:     At this time we will gradually increase the orthotic usage and I explained how to do this. Patient will be seen back to recheck again in 4 weeks

## 2013-10-29 ENCOUNTER — Encounter: Payer: Self-pay | Admitting: Podiatry

## 2013-10-29 ENCOUNTER — Ambulatory Visit (INDEPENDENT_AMBULATORY_CARE_PROVIDER_SITE_OTHER): Payer: Medicare Other | Admitting: Podiatry

## 2013-10-29 VITALS — BP 123/71 | HR 92 | Resp 16

## 2013-10-29 DIAGNOSIS — M2010 Hallux valgus (acquired), unspecified foot: Secondary | ICD-10-CM

## 2013-10-29 DIAGNOSIS — M7750 Other enthesopathy of unspecified foot: Secondary | ICD-10-CM

## 2013-10-29 MED ORDER — TRIAMCINOLONE ACETONIDE 10 MG/ML IJ SUSP
10.0000 mg | Freq: Once | INTRAMUSCULAR | Status: AC
  Administered 2013-10-29: 10 mg

## 2013-10-30 NOTE — Progress Notes (Signed)
Subjective:     Patient ID: Kathy Duran, female   DOB: [birth year], 68 y.o.   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

## 2013-11-20 ENCOUNTER — Encounter: Payer: Self-pay | Admitting: Internal Medicine

## 2013-11-20 ENCOUNTER — Ambulatory Visit (INDEPENDENT_AMBULATORY_CARE_PROVIDER_SITE_OTHER): Payer: Medicare Other | Admitting: Internal Medicine

## 2013-11-20 VITALS — BP 106/74 | HR 89 | Ht 65.0 in | Wt 196.6 lb

## 2013-11-20 DIAGNOSIS — M7989 Other specified soft tissue disorders: Secondary | ICD-10-CM

## 2013-11-20 DIAGNOSIS — Z96649 Presence of unspecified artificial hip joint: Secondary | ICD-10-CM

## 2013-11-20 DIAGNOSIS — M5432 Sciatica, left side: Secondary | ICD-10-CM | POA: Insufficient documentation

## 2013-11-20 DIAGNOSIS — M545 Low back pain, unspecified: Secondary | ICD-10-CM

## 2013-11-20 DIAGNOSIS — I10 Essential (primary) hypertension: Secondary | ICD-10-CM | POA: Insufficient documentation

## 2013-11-20 DIAGNOSIS — R0602 Shortness of breath: Secondary | ICD-10-CM

## 2013-11-20 DIAGNOSIS — R768 Other specified abnormal immunological findings in serum: Secondary | ICD-10-CM | POA: Insufficient documentation

## 2013-11-20 DIAGNOSIS — Z79899 Other long term (current) drug therapy: Secondary | ICD-10-CM

## 2013-11-20 DIAGNOSIS — R609 Edema, unspecified: Secondary | ICD-10-CM

## 2013-11-20 DIAGNOSIS — R600 Localized edema: Secondary | ICD-10-CM

## 2013-11-20 DIAGNOSIS — N3289 Other specified disorders of bladder: Secondary | ICD-10-CM | POA: Insufficient documentation

## 2013-11-20 DIAGNOSIS — E785 Hyperlipidemia, unspecified: Secondary | ICD-10-CM

## 2013-11-20 DIAGNOSIS — Z96641 Presence of right artificial hip joint: Secondary | ICD-10-CM

## 2013-11-20 DIAGNOSIS — M069 Rheumatoid arthritis, unspecified: Secondary | ICD-10-CM

## 2013-11-20 DIAGNOSIS — K219 Gastro-esophageal reflux disease without esophagitis: Secondary | ICD-10-CM

## 2013-11-20 NOTE — Patient Instructions (Signed)
Your physician has requested that you have a lower extremity venous duplex. This test is an ultrasound of the veins in the legs. It looks at venous blood flow that carries blood from the heart to the legs. Allow one hour for a Lower Venous exam. There are no restrictions or special instructions. ** lower extremity venous complete - venous reflux  Your physician recommends that you return for lab work a few days to a week - you will need to be fasting.   Your physician recommends that you schedule a follow-up appointment in: 1 month with Dr. Rennis Golden

## 2013-11-20 NOTE — Progress Notes (Signed)
OFFICE NOTE  Chief Complaint:  Leg swelling  Primary Care Physician: Georgann Housekeeper, MD  HPI:  Kathy Duran is a pleasant 68 year old female with a history of premature arthritis, hypertension, dyslipidemia, gout bladder spasm, GERD and low back pain. She's also had right hip surgery.  She is a self-referral for evaluation of lower stringy swelling. She previously seen Dr. Newt Lukes in 2007 in May that a stress test at that time. She's managed to use some compression stockings for her swelling which worked. She reports the swelling is gotten worse during the day which is on her feet but improves after she elevates it at night. She's had a limited workup of her swelling before occluding venous Dopplers which showed no evidence of a DVT in 2011. She denies any varicose veins. She has been having some problems with sores on her feet. She is seeing Dr. Dellia Nims in the [facility] with center. She denies any shortness of breath or chest pain. She has no orthopnea or PND.  PMHx:  Past Medical History  Diagnosis Date  . Sinus complaint   . Bilateral swelling of feet   . Hypertension   . Asthma   . Bruises easily   . Anxiety     Past Surgical History  Procedure Laterality Date  . Total hip arthroplasty Right 2006  . Shoulder surgery Right   . Back surgery    . Abdominal hysterectomy      FAMHx:  Family History  Problem Relation Age of Onset  . Heart attack Father   . Diabetes Father   . Cancer Father   . Hypertension Father   . Arrhythmia Mother   . Stroke Mother   . Hyperlipidemia Mother   . Diabetes Mother   . Hypertension Mother   . Hyperlipidemia Maternal Grandmother   . Hypertension Maternal Grandmother   . Heart attack Paternal Grandfather   . Stroke Paternal Grandfather   . Asthma Brother   . Emphysema Brother   . Hypertension Brother   . Diabetes Sister   . Hypertension Sister   . Hyperlipidemia Sister     SOCHx:   reports that she quit smoking about 30 years ago.  She has never used smokeless tobacco. She reports that she does not drink alcohol. Her drug history is not on file.  ALLERGIES:  No Known Allergies  ROS: A comprehensive review of systems was negative except for: Cardiovascular: positive for lower extremity edema Musculoskeletal: positive for arthralgias  HOME MEDS: Current Outpatient Prescriptions  Medication Sig Dispense Refill  . albuterol (PROVENTIL) (2.5 MG/3ML) 0.083% nebulizer solution Take 2.5 mg by nebulization as needed for wheezing.      Marland Kitchen aspirin 81 MG tablet Take 81 mg by mouth daily.      Marland Kitchen atorvastatin (LIPITOR) 20 MG tablet Take 1 tablet by mouth daily.      . cetirizine (ZYRTEC) 10 MG chewable tablet Chew 10 mg by mouth daily.      . Cholecalciferol (VITAMIN D-3 PO) Take by mouth daily.      . Coenzyme Q10 (CO Q-10 PO) Take by mouth daily.      Marland Kitchen estradiol (VIVELLE-DOT) 0.1 MG/24HR patch Place 1 patch onto the skin 2 (two) times a week.      . Fluticasone-Salmeterol (ADVAIR) 100-50 MCG/DOSE AEPB Inhale 1 puff into the lungs once.      Marland Kitchen KRILL OIL PO Take by mouth daily.      Marland Kitchen lidocaine (LIDODERM) 5 % Place 1 patch onto  the skin as needed. Remove & Discard patch within 12 hours or as directed by MD      . losartan (COZAAR) 50 MG tablet Take 50 mg by mouth daily.      . methocarbamol (ROBAXIN) 500 MG tablet Take 1 tablet by mouth 2 (two) times daily as needed.      . mometasone (NASONEX) 50 MCG/ACT nasal spray Place 2 sprays into the nose daily.      . montelukast (SINGULAIR) 10 MG tablet Take 10 mg by mouth at bedtime.      . Multiple Vitamin (MULTIVITAMIN) capsule Take 1 capsule by mouth daily.      Marland Kitchen MYRBETRIQ 50 MG TB24 tablet Take 1 tablet by mouth daily.      . pantoprazole (PROTONIX) 40 MG tablet Take 40 mg by mouth daily as needed.       . traMADol (ULTRAM) 50 MG tablet Take 50 mg by mouth every 6 (six) hours as needed for pain.      Marland Kitchen zolpidem (AMBIEN) 10 MG tablet Take 1 tablet by mouth at bedtime as needed.        No current facility-administered medications for this visit.    LABS/IMAGING: No results found for this or any previous visit (from the past 48 hour(s)). No results found.  VITALS: BP 106/74  Pulse 89  Ht 5\' 5"  (1.651 m)  Wt 196 lb 9.6 oz (89.177 kg)  BMI 32.72 kg/m2  EXAM: General appearance: alert and no distress Neck: no carotid bruit and no JVD Lungs: clear to auscultation bilaterally Heart: regular rate and rhythm, S1, S2 normal, no murmur, click, rub or gallop Abdomen: soft, non-tender; bowel sounds normal; no masses,  no organomegaly Extremities: edema 1+ ankle edema bilaterally Pulses: 2+ and symmetric Skin: Skin color, texture, turgor normal. No rashes or lesions Neurologic: Grossly normal Psych: Mood, affect normal  EKG: Normal sinus rhythm at 89  ASSESSMENT: 1. Leg edema 2. Hypertension 3. Dyslipidemia 4. Rheumatoid arthritis  PLAN: 1.   Ms. Windholz has leg edema for which there could be multiple etiologies. One of which could be varicose veins. She has no significant varicosities have her could have some deep vein reflux. There are no venous stasis changes. I would recommend venous Doppler to rule out venous insufficiency. Her swelling could also be caused by lymphedema, or perhaps vascular leak secondary to neuropathy. She has low back pain and some element of peripheral neuropathy. I do not feel that her swelling is due to her rheumatoid arthritis. There did not seem to be a cardiac cause of this. She does however have an increased risk for cardiovascular events due to her rheumatoid arthritis. She will need at least annual followup and good risk factor modification. All clinical and check laboratory work including a lipid profile, BMP and BNP. We'll see her back in a few weeks to review the results of these studies and most likely we'll place her on low-dose diuretic. I recommended that she get knee-high compression stockings as she has difficulty with the  thigh-high stockings and continue to use this on a daily basis.  Thurmon Fair, MD, [facility] Attending Cardiologist [facility]  [facility] 11/20/2013, 1:21 PM

## 2013-11-26 ENCOUNTER — Ambulatory Visit (INDEPENDENT_AMBULATORY_CARE_PROVIDER_SITE_OTHER): Payer: Medicare Other | Admitting: Podiatry

## 2013-11-26 ENCOUNTER — Encounter: Payer: Self-pay | Admitting: Podiatry

## 2013-11-26 VITALS — BP 141/79 | HR 88 | Resp 12

## 2013-11-26 DIAGNOSIS — M7750 Other enthesopathy of unspecified foot: Secondary | ICD-10-CM

## 2013-11-26 DIAGNOSIS — M8448XA Pathological fracture, other site, initial encounter for fracture: Secondary | ICD-10-CM

## 2013-11-26 NOTE — Progress Notes (Signed)
Subjective:     Patient ID: Kathy Duran, female   DOB: 1945-12-21, 68 y.o.   MRN: [id number]  HPI patient states that she's getting a lot of pain on top of her left foot and that the injection helped for a little while but the pain has returned quite significantly   Review of Systems     Objective:   Physical Exam Neurovascular status intact with patient found to be well oriented and is found to have pain in the dorsum of the left foot around the second metatarsal proximal shaft with inflammation noted    Assessment:     Possible tendinitis versus possible stress fracture in the proximal portion of the metatarsal    Plan:     Discussed ice therapy and today applied air fracture walker to immobilize with 2 week wearing of the boot and one week trying to reduce and reappoint 3 weeks for further evaluation

## 2013-12-01 ENCOUNTER — Ambulatory Visit (HOSPITAL_COMMUNITY)
Admission: RE | Admit: 2013-12-01 | Discharge: 2013-12-01 | Disposition: A | Discharge status: Home / Self Care | Payer: Medicare Other | Source: Ambulatory Visit | Attending: Cardiovascular Disease | Admitting: Cardiovascular Disease

## 2013-12-01 DIAGNOSIS — R609 Edema, unspecified: Secondary | ICD-10-CM

## 2013-12-01 DIAGNOSIS — M7989 Other specified soft tissue disorders: Secondary | ICD-10-CM | POA: Insufficient documentation

## 2013-12-01 DIAGNOSIS — R600 Localized edema: Secondary | ICD-10-CM

## 2013-12-01 LAB — BASIC METABOLIC PANEL
BUN: 9 mg/dL (ref 6–23)
CO2: 24 mEq/L (ref 19–32)
Calcium: 9.6 mg/dL (ref 8.4–10.5)
Chloride: 104 mEq/L (ref 96–112)
Creat: 0.66 mg/dL (ref 0.50–1.10)
Glucose, Bld: 97 mg/dL (ref 70–99)
Potassium: 4.4 mEq/L (ref 3.5–5.3)
Sodium: 139 mEq/L (ref 135–145)

## 2013-12-01 NOTE — Progress Notes (Signed)
Lower Extremity Venous Duplex Completed. °Brianna L Mazza,RVT °

## 2013-12-02 LAB — NMR LIPOPROFILE WITH LIPIDS
Cholesterol, Total: 212 mg/dL — ABNORMAL HIGH (ref <200)
HDL Particle Number: 45.2 umol/L (ref >=30.5)
HDL Size: 9.7 nm (ref >=9.2)
HDL-C: 81 mg/dL (ref >=40)
LDL (calc): 114 mg/dL — ABNORMAL HIGH (ref <100)
LDL Particle Number: 1262 nmol/L — ABNORMAL HIGH (ref <1000)
LDL Size: 21.4 nm (ref >20.5)
LP-IR Score: <25 (ref <=45)
Large HDL-P: 12.6 umol/L (ref >=4.8)
Large VLDL-P: 1.2 nmol/L (ref <=2.7)
Small LDL Particle Number: 325 nmol/L (ref <=527)
Triglycerides: 83 mg/dL (ref <150)
VLDL Size: 42.1 nm (ref <=46.6)

## 2013-12-02 LAB — BRAIN NATRIURETIC PEPTIDE: Brain Natriuretic Peptide: 35.5 pg/mL (ref 0.0–100.0)

## 2013-12-03 ENCOUNTER — Telehealth: Payer: Self-pay | Admitting: *Deleted

## 2013-12-03 ENCOUNTER — Encounter: Payer: Self-pay | Admitting: *Deleted

## 2013-12-03 DIAGNOSIS — E785 Hyperlipidemia, unspecified: Secondary | ICD-10-CM

## 2013-12-03 MED ORDER — ATORVASTATIN CALCIUM 40 MG PO TABS: 40.0000 mg | ORAL_TABLET | Freq: Every day | ORAL | Status: DC

## 2013-12-03 NOTE — Telephone Encounter (Signed)
Message copied by Lindell Spar on Thu Dec 03, 2013  8:35 AM ------      Message from: Chrystie Nose      Created: Wed Dec 02, 2013  4:50 PM       Her cholesterol is still to high - I would recommend she increase her atorvastatin to 40 mg daily.            Dr. Rexene Edison ------

## 2013-12-03 NOTE — Telephone Encounter (Signed)
Patient notified of lab work and medication changes. Atorvastatin 40mg  daily sent to pharmacy and repeat labs in 3 months ordered (slips and copy of lab results mailed to patient). Reminded of OV 6/1

## 2013-12-17 ENCOUNTER — Ambulatory Visit (INDEPENDENT_AMBULATORY_CARE_PROVIDER_SITE_OTHER): Payer: Medicare Other | Admitting: Podiatry

## 2013-12-17 ENCOUNTER — Encounter: Payer: Self-pay | Admitting: Podiatry

## 2013-12-17 VITALS — BP 117/68 | HR 89 | Resp 16

## 2013-12-17 DIAGNOSIS — M7750 Other enthesopathy of unspecified foot: Secondary | ICD-10-CM

## 2013-12-17 DIAGNOSIS — M8448XA Pathological fracture, other site, initial encounter for fracture: Secondary | ICD-10-CM

## 2013-12-17 NOTE — Progress Notes (Signed)
Subjective:     Patient ID: Kathy Duran, female   DOB: 07/16/1946, 68 y.o.   MRN: [id number]  HPI patient states that my foot is feeling much better with swelling still present but I am not wearing the boot at this time   Review of Systems     Objective:   Physical Exam Neurovascular status intact with diminishment of discomfort dorsal left foot at the extensor complex and base of second metatarsal    Assessment:     Improve tendinitis or possible stress fracture left    Plan:     Advised patient on gradual reduction of boot and ice therapy and reappoint if symptoms indicate

## 2013-12-21 ENCOUNTER — Ambulatory Visit (INDEPENDENT_AMBULATORY_CARE_PROVIDER_SITE_OTHER): Payer: Medicare Other | Admitting: Internal Medicine

## 2013-12-21 ENCOUNTER — Encounter: Payer: Self-pay | Admitting: Internal Medicine

## 2013-12-21 VITALS — BP 113/70 | HR 91 | Ht 65.0 in | Wt 186.5 lb

## 2013-12-21 DIAGNOSIS — E785 Hyperlipidemia, unspecified: Secondary | ICD-10-CM

## 2013-12-21 DIAGNOSIS — I10 Essential (primary) hypertension: Secondary | ICD-10-CM

## 2013-12-21 DIAGNOSIS — M7989 Other specified soft tissue disorders: Secondary | ICD-10-CM

## 2013-12-21 NOTE — Patient Instructions (Signed)
Your physician recommends that you schedule a follow-up appointment as needed  

## 2013-12-21 NOTE — Progress Notes (Signed)
OFFICE NOTE  Chief Complaint:  Leg swelling  Primary Care Physician: Georgann Housekeeper, MD  HPI:  Kathy Duran is a pleasant 68 year old female with a history of premature arthritis, hypertension, dyslipidemia, gout bladder spasm, GERD and low back pain. She's also had right hip surgery.  She is a self-referral for evaluation of lower stringy swelling. She previously seen Dr. Newt Lukes in 2007 in May that a stress test at that time. She's managed to use some compression stockings for her swelling which worked. She reports the swelling is gotten worse during the day which is on her feet but improves after she elevates it at night. She's had a limited workup of her swelling before occluding venous Dopplers which showed no evidence of a DVT in 2011. She denies any varicose veins. She has been having some problems with sores on her feet. She is seeing Dr. Leary Roca in the [facility] with center. She denies any shortness of breath or chest pain. She has no orthopnea or PND.  Kathy Duran returns today for followup of her venous Dopplers. This did not show any evidence for venous insufficiency. There was no evidence for deep vein thrombosis. She continues to have some swelling mostly in her left foot. She is now back in a walking boot. Her renal function is normal and BNP was low indicating this is not likely heart failure. 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 In fact in her right hand it is so significant particularly toward the wrist that she is undergoing carpal tunnel surgery later this week.  PMHx:  Past Medical History  Diagnosis Date  . Sinus complaint   . Bilateral swelling of feet   . Hypertension   . Asthma   . Bruises easily   . Anxiety     Past Surgical History  Procedure Laterality Date  . Total hip arthroplasty Right 2006  . Shoulder surgery Right   . Back surgery    . Abdominal hysterectomy       FAMHx:  Family History  Problem Relation Age of Onset  . Heart attack Father   . Diabetes Father   . Cancer Father   . Hypertension Father   . Arrhythmia Mother   . Stroke Mother   . Hyperlipidemia Mother   . Diabetes Mother   . Hypertension Mother   . Hyperlipidemia Maternal Grandmother   . Hypertension Maternal Grandmother   . Heart attack Paternal Grandfather   . Stroke Paternal Grandfather   . Asthma Brother   . Emphysema Brother   . Hypertension Brother   . Diabetes Sister   . Hypertension Sister   . Hyperlipidemia Sister     SOCHx:   reports that she quit smoking about 30 years ago. She has never used smokeless tobacco. She reports that she does not drink alcohol. Her drug history is not on file.  ALLERGIES:  No Known Allergies  ROS: A comprehensive review of systems was negative except for: Cardiovascular: positive for lower extremity edema Musculoskeletal: positive for arthralgias  HOME MEDS: Current Outpatient Prescriptions  Medication Sig Dispense Refill  . albuterol (PROVENTIL) (2.5 MG/3ML) 0.083% nebulizer solution Take 2.5 mg by nebulization as needed for wheezing.      Marland Kitchen aspirin 81 MG tablet Take 81 mg by mouth daily.      Marland Kitchen atorvastatin (LIPITOR) 40 MG tablet Take 1 tablet (40 mg total) by mouth daily.  30 tablet  6  . cetirizine (ZYRTEC) 10 MG chewable tablet Chew 10 mg by mouth daily.      . Cholecalciferol (VITAMIN D-3 PO) Take by mouth daily.      . Coenzyme Q10 (CO Q-10 PO) Take by mouth daily.      Marland Kitchen estradiol (VIVELLE-DOT) 0.1 MG/24HR patch Place 1 patch onto the skin 2 (two) times a week.      . Fluticasone-Salmeterol (ADVAIR) 100-50 MCG/DOSE AEPB Inhale 1 puff into the lungs once.      Marland Kitchen KRILL OIL PO Take by mouth daily.      Marland Kitchen lidocaine (LIDODERM) 5 % Place 1 patch onto the skin as needed. Remove & Discard patch within 12 hours or as directed by MD      . losartan (COZAAR) 50 MG tablet Take 50 mg by mouth daily.      . methocarbamol  (ROBAXIN) 500 MG tablet Take 1 tablet by mouth 2 (two) times daily as needed.      . mometasone (NASONEX) 50 MCG/ACT nasal spray Place 2 sprays into the nose daily.      . montelukast (SINGULAIR) 10 MG tablet Take 10 mg by mouth at bedtime.      . Multiple Vitamin (MULTIVITAMIN) capsule Take 1 capsule by mouth daily.      Marland Kitchen MYRBETRIQ 50 MG TB24 tablet Take 1 tablet by mouth daily.      . pantoprazole (PROTONIX) 40 MG tablet Take 40 mg by mouth daily as needed.       . traMADol (ULTRAM) 50 MG tablet Take 50 mg by mouth every 6 (six) hours as needed for pain.      Marland Kitchen zolpidem (AMBIEN) 10 MG tablet Take 1 tablet by mouth at bedtime as needed.       No current facility-administered medications for this visit.    LABS/IMAGING: No results found for this or any previous visit (from the past 48 hour(s)). No results found.  VITALS: BP 113/70  Pulse 91  Ht 5\' 5"  (1.651 m)  Wt 186 lb 8 oz (84.596 kg)  BMI 31.04 kg/m2  EXAM: deferred  EKG: deferred  ASSESSMENT: 1. Leg edema 2. Hypertension 3. Dyslipidemia 4. Rheumatoid arthritis  PLAN: 1.   Ms. Gellerman has leg edema which I suspect is due to neuropathy. She seems to have both upper and lower extremity neuropathic pain and sensitivity especially during the ultrasound. There is no evidence for heart failure. Based on her swelling, she could benefit from a low-dose diuretic and option would be to combine HCTZ with losartan. She wishes to wait to have this started until after her carpal tunnel surgery this week. I think the addition of the diuretic could be deferred to her primary care provider or I would be happy to write it for her but she would need a followup metabolic profile in one week.  Thanks again for the kind referral. Followup with me can be as needed.  Thurmon Fair, MD, [facility] Attending Cardiologist [facility]  [facility] 12/21/2013, 12:27 PM

## 2013-12-30 ENCOUNTER — Other Ambulatory Visit: Payer: Self-pay | Admitting: Internal Medicine

## 2013-12-30 DIAGNOSIS — Z1231 Encounter for screening mammogram for malignant neoplasm of breast: Secondary | ICD-10-CM

## 2014-01-06 ENCOUNTER — Encounter (INDEPENDENT_AMBULATORY_CARE_PROVIDER_SITE_OTHER): Payer: Self-pay

## 2014-01-06 ENCOUNTER — Ambulatory Visit
Admission: RE | Admit: 2014-01-06 | Discharge: 2014-01-06 | Disposition: A | Discharge status: Home / Self Care | Payer: Medicare Other | Source: Ambulatory Visit | Attending: Internal Medicine | Admitting: Internal Medicine

## 2014-01-06 DIAGNOSIS — Z1231 Encounter for screening mammogram for malignant neoplasm of breast: Secondary | ICD-10-CM

## 2014-01-08 ENCOUNTER — Other Ambulatory Visit: Payer: Self-pay | Admitting: Internal Medicine

## 2014-01-08 DIAGNOSIS — R928 Other abnormal and inconclusive findings on diagnostic imaging of breast: Secondary | ICD-10-CM

## 2014-01-18 ENCOUNTER — Ambulatory Visit
Admission: RE | Admit: 2014-01-18 | Discharge: 2014-01-18 | Disposition: A | Discharge status: Home / Self Care | Payer: Medicare Other | Source: Ambulatory Visit | Attending: Internal Medicine | Admitting: Internal Medicine

## 2014-01-18 DIAGNOSIS — R928 Other abnormal and inconclusive findings on diagnostic imaging of breast: Secondary | ICD-10-CM

## 2014-08-14 ENCOUNTER — Other Ambulatory Visit: Payer: Self-pay | Admitting: Internal Medicine

## 2014-08-15 NOTE — Telephone Encounter (Signed)
Rx(s) sent to pharmacy electronically.  

## 2014-09-28 ENCOUNTER — Other Ambulatory Visit: Payer: Self-pay

## 2014-09-28 DIAGNOSIS — Z1231 Encounter for screening mammogram for malignant neoplasm of breast: Secondary | ICD-10-CM

## 2014-10-22 ENCOUNTER — Encounter (HOSPITAL_COMMUNITY): Payer: Self-pay | Admitting: Family Medicine

## 2014-10-22 ENCOUNTER — Emergency Department (HOSPITAL_COMMUNITY)
Admission: EM | Admit: 2014-10-22 | Discharge: 2014-10-22 | Disposition: A | Discharge status: Home / Self Care | Payer: Medicare PPO | Source: Home / Self Care | Attending: Family Medicine | Admitting: Family Medicine

## 2014-10-22 DIAGNOSIS — J0100 Acute maxillary sinusitis, unspecified: Secondary | ICD-10-CM

## 2014-10-22 DIAGNOSIS — R05 Cough: Secondary | ICD-10-CM

## 2014-10-22 DIAGNOSIS — R0982 Postnasal drip: Secondary | ICD-10-CM | POA: Diagnosis not present

## 2014-10-22 DIAGNOSIS — R059 Cough, unspecified: Secondary | ICD-10-CM

## 2014-10-22 LAB — POCT RAPID STREP A: Streptococcus, Group A Screen (Direct): NEGATIVE

## 2014-10-22 MED ORDER — BENZONATATE 100 MG PO CAPS: 100.0000 mg | ORAL_CAPSULE | Freq: Three times a day (TID) | ORAL | Status: DC | PRN

## 2014-10-22 MED ORDER — PREDNISONE 20 MG PO TABS
ORAL_TABLET | ORAL | Status: AC
  Filled 2014-10-22: qty 3

## 2014-10-22 MED ORDER — FLUCONAZOLE 150 MG PO TABS: 150.0000 mg | ORAL_TABLET | Freq: Every day | ORAL | Status: DC

## 2014-10-22 MED ORDER — IPRATROPIUM BROMIDE 0.06 % NA SOLN: 2.0000 | Freq: Four times a day (QID) | NASAL | Status: DC

## 2014-10-22 MED ORDER — AMOXICILLIN-POT CLAVULANATE 875-125 MG PO TABS: 1.0000 | ORAL_TABLET | Freq: Two times a day (BID) | ORAL | Status: DC

## 2014-10-22 MED ORDER — GUAIFENESIN-CODEINE 100-10 MG/5ML PO SOLN: 5.0000 mL | Freq: Four times a day (QID) | ORAL | Status: DC | PRN

## 2014-10-22 MED ORDER — PREDNISONE 20 MG PO TABS
60.0000 mg | ORAL_TABLET | Freq: Once | ORAL | Status: AC
  Administered 2014-10-22: 60 mg via ORAL

## 2014-10-22 NOTE — Discharge Instructions (Signed)
°  Suffering from a viral upper respiratory tract infection and allergies causing postnasal drip and throat irritation. You seem to also be starting the beginnings of a sinus infection. Please start the antibiotics in 24-48 hours if you're not better or if he gets significantly worse. Please start using the nasal Atrovent and continue using full dose daily Zyrtec, ibuprofen 400-600 mg every 6 hours, and nasal saline as tolerated. Please use the Diflucan if you develop a yeast infection. Please use the Tessalon Perles for daytime cough and Robitussin-AC for nighttime cough.

## 2014-10-22 NOTE — ED Notes (Signed)
C/o cough, runny nose, ST onset Sunday Denies fevers Alert, no signs of acute distress.

## 2014-10-22 NOTE — ED Provider Notes (Signed)
CSN: [id number]     Arrival date & time 10/22/14  1310 History   First MD Initiated Contact with Patient 10/22/14 1520     Chief Complaint  Patient presents with  . Sore Throat   (Consider location/radiation/quality/duration/timing/severity/associated sxs/prior Treatment) HPI 6 days ago developed coughing and sneezing. otc tylenol and theraflu and mucinex w/ minimal relief. Today developed sore throat and sinus fullness and tenderness. Sweats but no reported fevers. Flonase nightly until a day or 2 ago when it started causing severe nasal irritation. That he pot with minimal relief. Denies nausea, vomiting, diarrhea, dysuria, frequency, back pain, headache, chest pain, shortness of breath, palpitations. Developed rib soreness w/ coughing   Past Medical History  Diagnosis Date  . Sinus complaint   . Bilateral swelling of feet   . Hypertension   . Asthma   . Bruises easily   . Anxiety    Past Surgical History  Procedure Laterality Date  . Total hip arthroplasty Right 2006  . Shoulder surgery Right   . Back surgery    . Abdominal hysterectomy     Family History  Problem Relation Age of Onset  . Heart attack Father   . Diabetes Father   . Cancer Father   . Hypertension Father   . Arrhythmia Mother   . Stroke Mother   . Hyperlipidemia Mother   . Diabetes Mother   . Hypertension Mother   . Hyperlipidemia Maternal Grandmother   . Hypertension Maternal Grandmother   . Heart attack Paternal Grandfather   . Stroke Paternal Grandfather   . Asthma Brother   . Emphysema Brother   . Hypertension Brother   . Diabetes Sister   . Hypertension Sister   . Hyperlipidemia Sister    History  Substance Use Topics  . Smoking status: Former Smoker    Quit date: 11/21/1983  . Smokeless tobacco: Never Used  . Alcohol Use: No   OB History    No data available     Review of Systems Per HPI with all other pertinent systems negative.   Allergies  Review of patient's allergies  indicates no known allergies.  Home Medications   Prior to Admission medications   Medication Sig Start Date End Date Taking? Authorizing Provider  albuterol (PROVENTIL) (2.5 MG/3ML) 0.083% nebulizer solution Take 2.5 mg by nebulization as needed for wheezing.    Historical Provider, MD  amoxicillin-clavulanate (AUGMENTIN) 875-125 MG per tablet Take 1 tablet by mouth 2 (two) times daily. 10/22/14   Ozella Rocks, MD  aspirin 81 MG tablet Take 81 mg by mouth daily.    Historical Provider, MD  atorvastatin (LIPITOR) 40 MG tablet TAKE 1 TABLET (40 MG TOTAL) BY MOUTH DAILY. 08/15/14   Chrystie Nose, MD  benzonatate (TESSALON PERLES) 100 MG capsule Take 1-2 capsules (100-200 mg total) by mouth 3 (three) times daily as needed for cough. 10/22/14   Ozella Rocks, MD  cetirizine (ZYRTEC) 10 MG chewable tablet Chew 10 mg by mouth daily.    Historical Provider, MD  Cholecalciferol (VITAMIN D-3 PO) Take by mouth daily.    Historical Provider, MD  Coenzyme Q10 (CO Q-10 PO) Take by mouth daily.    Historical Provider, MD  estradiol (VIVELLE-DOT) 0.1 MG/24HR patch Place 1 patch onto the skin 2 (two) times a week.    Historical Provider, MD  fluconazole (DIFLUCAN) 150 MG tablet Take 1 tablet (150 mg total) by mouth daily. Repeat dose in 3 days 10/22/14   Ozella Rocks,  MD  Fluticasone-Salmeterol (ADVAIR) 100-50 MCG/DOSE AEPB Inhale 1 puff into the lungs once.    Historical Provider, MD  guaiFENesin-codeine 100-10 MG/5ML syrup Take 5-10 mLs by mouth every 6 (six) hours as needed for cough. 10/22/14   Ozella Rocks, MD  ipratropium (ATROVENT) 0.06 % nasal spray Place 2 sprays into both nostrils 4 (four) times daily. 10/22/14   Ozella Rocks, MD  KRILL OIL PO Take by mouth daily.    Historical Provider, MD  lidocaine (LIDODERM) 5 % Place 1 patch onto the skin as needed. Remove & Discard patch within 12 hours or as directed by MD    Historical Provider, MD  losartan (COZAAR) 50 MG tablet Take 50 mg by mouth  daily.    Historical Provider, MD  methocarbamol (ROBAXIN) 500 MG tablet Take 1 tablet by mouth 2 (two) times daily as needed. 10/30/13   Historical Provider, MD  mometasone (NASONEX) 50 MCG/ACT nasal spray Place 2 sprays into the nose daily.    Historical Provider, MD  montelukast (SINGULAIR) 10 MG tablet Take 10 mg by mouth at bedtime.    Historical Provider, MD  Multiple Vitamin (MULTIVITAMIN) capsule Take 1 capsule by mouth daily.    Historical Provider, MD  MYRBETRIQ 50 MG TB24 tablet Take 1 tablet by mouth daily. 11/11/13   Historical Provider, MD  pantoprazole (PROTONIX) 40 MG tablet Take 40 mg by mouth daily as needed.     Historical Provider, MD  traMADol (ULTRAM) 50 MG tablet Take 50 mg by mouth every 6 (six) hours as needed for pain.    Historical Provider, MD  zolpidem (AMBIEN) 10 MG tablet Take 1 tablet by mouth at bedtime as needed. 11/16/13   Historical Provider, MD   BP 91/56 mmHg  Pulse 96  Temp(Src) 97.9 F (36.6 C) (Oral)  Resp 16  SpO2 96% Physical Exam Physical Exam  Constitutional: oriented to person, place, and time. appears well-developed and well-nourished. No distress.  HENT:  Axillary sinuses tender to palpation, boggy nasal turbinates right greater than left, pharyngeal injection, Head: Normocephalic and atraumatic.  Eyes: EOMI. PERRL.  Neck: Normal range of motion.  Cardiovascular: RRR, no m/r/g, 2+ distal pulses,  Pulmonary/Chest: Effort normal and breath sounds normal. No respiratory distress.  Abdominal: Soft. Bowel sounds are normal. NonTTP, no distension.  Musculoskeletal: Normal range of motion. Non ttp, no effusion.  Neurological: alert and oriented to person, place, and time.  Skin: Skin is warm. No rash noted. non diaphoretic.  Psychiatric: normal mood and affect. behavior is normal. Judgment and thought content normal.   ED Course  Procedures (including critical care time) Labs Review Labs Reviewed  POCT RAPID STREP A ([facility] CARE ONLY)     Imaging Review No results found.   MDM   1. Acute maxillary sinusitis, recurrence not specified   2. Post-nasal drip   3. Cough    (Prednisone 60 mg by mouth 1 in the office. 4 sinus inflammation and laryngitis Continue Flonase as tolerated, start Zyrtec, nasal Atrovent, ibuprofen every 6 hours for symptomatically relief. Start Augmentin for maxillary sinusitis if continues to worsen after above therapies or develops fevers Diflucan if develops east infection  Precautions given and all questions answered  Shelly Flatten, MD Family Medicine 10/22/2014, 3:44 PM       Ozella Rocks, MD 10/22/14 [phone]

## 2014-10-25 LAB — CULTURE, GROUP A STREP: Strep A Culture: NEGATIVE

## 2014-12-27 DIAGNOSIS — B373 Candidiasis of vulva and vagina: Secondary | ICD-10-CM | POA: Diagnosis not present

## 2014-12-27 DIAGNOSIS — R350 Frequency of micturition: Secondary | ICD-10-CM | POA: Diagnosis not present

## 2015-01-05 DIAGNOSIS — M3500 Sicca syndrome, unspecified: Secondary | ICD-10-CM | POA: Diagnosis not present

## 2015-01-05 DIAGNOSIS — J45909 Unspecified asthma, uncomplicated: Secondary | ICD-10-CM | POA: Diagnosis not present

## 2015-01-05 DIAGNOSIS — E782 Mixed hyperlipidemia: Secondary | ICD-10-CM | POA: Diagnosis not present

## 2015-01-05 DIAGNOSIS — I10 Essential (primary) hypertension: Secondary | ICD-10-CM | POA: Diagnosis not present

## 2015-01-19 ENCOUNTER — Ambulatory Visit
Admission: RE | Admit: 2015-01-19 | Discharge: 2015-01-19 | Disposition: A | Discharge status: Home / Self Care | Payer: Medicare PPO | Source: Ambulatory Visit

## 2015-01-19 DIAGNOSIS — Z1231 Encounter for screening mammogram for malignant neoplasm of breast: Secondary | ICD-10-CM

## 2015-01-21 DIAGNOSIS — N390 Urinary tract infection, site not specified: Secondary | ICD-10-CM | POA: Diagnosis not present

## 2015-01-28 DIAGNOSIS — N959 Unspecified menopausal and perimenopausal disorder: Secondary | ICD-10-CM | POA: Diagnosis not present

## 2015-01-28 DIAGNOSIS — N3941 Urge incontinence: Secondary | ICD-10-CM | POA: Diagnosis not present

## 2015-01-28 DIAGNOSIS — N760 Acute vaginitis: Secondary | ICD-10-CM | POA: Diagnosis not present

## 2015-01-31 DIAGNOSIS — J45909 Unspecified asthma, uncomplicated: Secondary | ICD-10-CM | POA: Diagnosis not present

## 2015-02-15 DIAGNOSIS — G4700 Insomnia, unspecified: Secondary | ICD-10-CM | POA: Diagnosis not present

## 2015-02-15 DIAGNOSIS — M797 Fibromyalgia: Secondary | ICD-10-CM | POA: Diagnosis not present

## 2015-02-15 DIAGNOSIS — E785 Hyperlipidemia, unspecified: Secondary | ICD-10-CM | POA: Diagnosis not present

## 2015-02-15 DIAGNOSIS — N952 Postmenopausal atrophic vaginitis: Secondary | ICD-10-CM | POA: Diagnosis not present

## 2015-02-15 DIAGNOSIS — M170 Bilateral primary osteoarthritis of knee: Secondary | ICD-10-CM | POA: Diagnosis not present

## 2015-02-15 DIAGNOSIS — Z6831 Body mass index (BMI) 31.0-31.9, adult: Secondary | ICD-10-CM | POA: Diagnosis not present

## 2015-02-15 DIAGNOSIS — I10 Essential (primary) hypertension: Secondary | ICD-10-CM | POA: Diagnosis not present

## 2015-02-15 DIAGNOSIS — J449 Chronic obstructive pulmonary disease, unspecified: Secondary | ICD-10-CM | POA: Diagnosis not present

## 2015-02-15 DIAGNOSIS — K219 Gastro-esophageal reflux disease without esophagitis: Secondary | ICD-10-CM | POA: Diagnosis not present

## 2015-02-16 ENCOUNTER — Encounter: Payer: Self-pay | Admitting: Physical Therapy

## 2015-02-16 ENCOUNTER — Ambulatory Visit: Payer: Medicare PPO | Attending: Obstetrics & Gynecology | Admitting: Physical Therapy

## 2015-02-16 DIAGNOSIS — N8189 Other female genital prolapse: Secondary | ICD-10-CM | POA: Diagnosis not present

## 2015-02-16 NOTE — Patient Instructions (Signed)
Quick Contraction: Gravity Eliminated (Hook-Lying)   Lie with hips and knees bent. Quickly squeeze then fully relax pelvic floor. Perform __5_ sets of _1__. Rest for _1__ seconds between sets. Do _3__ times a day.   Copyright  VHI. All rights reserved.  Slow Contraction: Gravity Eliminated (Hook-Lying)   Lie with hips and knees bent. Slowly squeeze pelvic floor for 5___ seconds. Rest for _5__ seconds. Repeat _10__ times. Do 3times a day.   Copyright  VHI. All rights reserved.   Certain foods and liquids will decrease the pH making the urine more acidic.  Urinary urgency increases when the urine has a low pH.  Most common irritants: alcohol, carbonated beverages and caffinated beverages.  Foods to avoid: apple juice, apples, ascorbic acid, canteloupes, chili, citrus fruits, coffee, cranberries, grapes, guava, peaches, pepper, pineapple, plums, strawberries, tea, tomatoes, and vinegar.  Drinking plenty of water may help to increase the pH and dilute out any of the effects of specific irritants.  Foods that are NOT irritating to the bladder include: Pears, papayas, sun-brewed teas, watermelons, non-citrus herbal teas, apricots, kava and low-acid instant drinks (Postum)  [facility] [address], [address] [place], [place] [address] Phone # [phone] Fax [phone]

## 2015-02-16 NOTE — Therapy (Signed)
[facility] [address] [place], [place], [address] Phone: [phone]   Fax:  [phone]  Physical Therapy Evaluation  Patient Details  Name: Kathy Duran MRN: [id number] Date of Birth: 1946/04/14 Referring Provider:  Myna Hidalgo, DO  Encounter Date: 02/16/2015      PT End of Session - 02/16/15 1622    Visit Number 1   Date for PT Re-Evaluation 04/13/15   PT Start Time 1535   PT Stop Time 1622   PT Time Calculation (min) 47 min   Activity Tolerance Patient tolerated treatment well   Behavior During Therapy [facility] for tasks assessed/performed      Past Medical History  Diagnosis Date  . Sinus complaint   . Bilateral swelling of feet   . Hypertension   . Asthma   . Bruises easily   . Anxiety     Past Surgical History  Procedure Laterality Date  . Total hip arthroplasty Right 2006  . Shoulder surgery Right   . Back surgery    . Abdominal hysterectomy      There were no vitals filed for this visit.  Visit Diagnosis:  Pelvic floor weakness in female - Plan: PT plan of care cert/re-cert      Subjective Assessment - 02/16/15 1546    Subjective Patient has urinary leakage during the day and at night. Patient reports urgency with needing to go to the bathroom. Began 08/23/2014.   Patient Stated Goals reduce urinary incontinence   Currently in Pain? No/denies            [facility] PT Assessment - 02/16/15 0001    Assessment   Medical Diagnosis NN39.41 urinary Incontinence Urgency   Onset Date/Surgical Date 05/24/15   Prior Therapy None   Precautions   Precautions Posterior Hip   Precaution Comments Right hip replacement   Balance Screen   Has the patient fallen in the past 6 months No   Has the patient had a decrease in activity level because of a fear of falling?  No   Is the patient reluctant to leave their home because of a fear of falling?  No   Prior Function   Level of Independence  Independent   Observation/Other Assessments   Focus on Therapeutic Outcomes (FOTO)  50% limitation   ROM / Strength   AROM / PROM / Strength AROM;Strength   AROM   Overall AROM Comments Lumbar ROM is full   Strength   Overall Strength Comments bil. hip abduction 3/5                 Pelvic Floor Special Questions - 02/16/15 0001    Diagnostic Test/Procedure Performed Yes   Type Diagnostic/Procedure when have the urgency bladder is not fully full   Prior Pregnancies Yes   Number of Vaginal Deliveries 1   Any difficulty with labor and deliveries Yes  24 hours labor, infection afterwards   Episiotomy Performed Yes   Currently Sexually Active No   Urinary Leakage Yes   How often 2  when in the pool   Pad use 1   Urinary urgency Yes  goes every hour   Urinary frequency --  night time 3 times   Pelvic Floor Internal Exam Patient confirms identification and approve physical therapist to assess muscle strength and integrity   Exam Type Vaginal   Palpation tenderness located in left obturator internist   Strength fair squeeze, definite lift   Strength # of reps  5   Strength # of seconds 5   Tone increased tone on left                  PT Education - 02/16/15 1618    Education provided Yes   Education Details bladder irritants, pelvic floor exercises   Person(s) Educated Patient   Methods Explanation;Demonstration;Tactile cues;Verbal cues;Handout   Comprehension Verbalized understanding;Returned demonstration          PT Short Term Goals - 02/16/15 1632    PT SHORT TERM GOAL #1   Title understand what bladder irritants are and how they affect the bladder   Time 4   Period Weeks   Status New   PT SHORT TERM GOAL #2   Title understand how to contract pelvic floor muscles correctly without substitution and holding her breath   Time 4   Period Weeks   Status New   PT SHORT TERM GOAL #3   Title able to urinate every 1 1/2 hours due to using the urge to  void technique   Time 4   Period Weeks   Status New           PT Long Term Goals - 02/16/15 1634    PT LONG TERM GOAL #1   Title independent with HEP and understand how to advance herself   Time 8   Period Weeks   Status New   PT LONG TERM GOAL #2   Title urinate every 2-3 hours    Time 8   Period Weeks   Status New   PT LONG TERM GOAL #3   Title pelvic floor strength is 4/5 due to no urinary leakage for 3 weeks   Time 8   Period Weeks   Status New   PT LONG TERM GOAL #4   Title urgency decreased >/= 75%   Time 8   Period Weeks   Status New               Plan - 02/16/15 1622    Clinical Impression Statement Patient is a 69 year old female with diagnosis of Urinary urgency incontinence that began in 05/2015.  Patient reported urinary leakage and urgency began when her husband mother was in the hospital and they had to take care of her. Patient reports she is going to the bathroom every hour.  Patient reports she had tests done on her bladder and when she had the urge to urinate her bladder was not filled fully.  Palpable tenderness located in bil. left levator ani.Patient reports she leaks urine  when she is getting into the pool.  Bilateral hip strength is 3/5.  Pelvic floor strength is 3/5 holding for 5 seconds with verbal cues to not use the gluteals and no bearing down. FOTO score is 50% limitaiton. Patient would benefit from physical therapy to increase pelvic strength and reduce urgency to urinate.    Pt will benefit from skilled therapeutic intervention in order to improve on the following deficits Increased muscle spasms;Decreased endurance;Decreased activity tolerance;Pain;Decreased strength   Rehab Potential Excellent   Clinical Impairments Affecting Rehab Potential None   PT Frequency 1x / week   PT Duration 8 weeks   PT Treatment/Interventions Biofeedback;Therapeutic activities;Therapeutic exercise;Neuromuscular re-education;Manual techniques;Patient/family  education   PT Next Visit Plan progress pelvic floor exercises, urge to void, diaphragmatic breathing, abdominal exercises   PT Home Exercise Plan abdominal exercises, urge to void   Recommended Other Services None   Consulted and Agree with  Plan of Care Patient          G-Codes - 03-11-2015 1544    Functional Assessment Tool Used FOTO score for PFDI urinary is 50% limitation   Functional Limitation Other PT primary   Other PT Primary Current Status (E3154) At least 40 percent but less than 60 percent impaired, limited or restricted   Other PT Primary Goal Status (M0867) At least 20 percent but less than 40 percent impaired, limited or restricted       Problem List Patient Active Problem List   Diagnosis Date Noted  . Rheumatoid arthritis 11/20/2013  . Low back pain 11/20/2013  . HTN (hypertension) 11/20/2013  . GERD (gastroesophageal reflux disease) 11/20/2013  . Bladder spasm 11/20/2013  . Hyperlipidemia 11/20/2013  . Status post right hip replacement 11/20/2013  . Bilateral swelling of feet   . Bruises easily     GRAY,CHERYL,PT Mar 11, 2015, 4:38 PM  [facility] [address] [place], [place], [address] Phone: [phone]   Fax:  [phone]

## 2015-02-21 DIAGNOSIS — M170 Bilateral primary osteoarthritis of knee: Secondary | ICD-10-CM | POA: Diagnosis not present

## 2015-02-21 DIAGNOSIS — M3500 Sicca syndrome, unspecified: Secondary | ICD-10-CM | POA: Diagnosis not present

## 2015-02-21 DIAGNOSIS — M7020 Olecranon bursitis, unspecified elbow: Secondary | ICD-10-CM | POA: Diagnosis not present

## 2015-02-21 DIAGNOSIS — M5137 Other intervertebral disc degeneration, lumbosacral region: Secondary | ICD-10-CM | POA: Diagnosis not present

## 2015-02-22 DIAGNOSIS — Z036 Encounter for observation for suspected toxic effect from ingested substance ruled out: Secondary | ICD-10-CM | POA: Diagnosis not present

## 2015-02-22 DIAGNOSIS — Z79899 Other long term (current) drug therapy: Secondary | ICD-10-CM | POA: Diagnosis not present

## 2015-02-22 DIAGNOSIS — R300 Dysuria: Secondary | ICD-10-CM | POA: Diagnosis not present

## 2015-03-02 ENCOUNTER — Ambulatory Visit: Payer: Medicare PPO | Attending: Obstetrics & Gynecology | Admitting: Physical Therapy

## 2015-03-02 DIAGNOSIS — N8189 Other female genital prolapse: Secondary | ICD-10-CM | POA: Insufficient documentation

## 2015-03-02 NOTE — Patient Instructions (Addendum)
Relaxation Exercises with the Urge to Void   When you experience an urge to void:  FIRST  Stop and stand very still    Sit down if you can    Don't move    You need to stay very still to maintain control  SECOND Squeeze your pelvic floor muscles 5 times, like a quick flick, to keep from leaking  THIRD Relax  Take a deep breath and then let it out  Try to make the urge go away by using relaxation and visualization techniques  FINALLY When you feel the urge go away somewhat, walk normally to the bathroom.   If the urge gets suddenly stronger on the way, you may stop again and relax to regain control.  I want you to wait 1 1/2 hour for going to the bathroom.  Diaphragmatic Breathing - Supine   Hands on top of navel, lips closed, breathe in through nose filling stomach with air, navel moves out toward hands. Exhale through puckered lips, hands follow exhalation in. Repeat _5__ times. Rest _1__ seconds between repeats. Do _2__ times per day. And when you have the urge.   Copyright  VHI. All rights reserved.    Lower abdominal/core stability exercises  1. Practice your breathing technique: Inhale through your nose expanding your belly and rib cage. Try not to breathe into your chest. Exhale slowly and gradually out your mouth feeling a sense of softness to your body. Practice multiple times. This can be performed unlimited.  2. Finding the lower abdominals. Laying on your back with the knees bent, place your fingers just below your belly button. Using your breathing technique from above, on your exhale gently pull the belly button away from your fingertips without tensing any other muscles. Practice this 5x. Next, as you exhale, draw belly button inwards and hold onto it...then feel as if you are pulling that muscle across your pelvis like you are tightening a belt. This can be hard to do at first so be patient and practice. Do 5-10 reps 1-3 x day. Always recognize quality over  quantity; if your abdominal muscles become tired you will notice you may tighten/contract other muscles. This is the time to take a break.   Practice this first laying on your back, then in sitting, progressing to standing and finally adding it to all your daily movements.   3. Finding your pelvic floor. Using the breathing technique above, when your exhale, this time draw your pelvic floor muscles up as if you were attempting to stop the flow of urination. Be careful NOT to tense any other muscles. This can be hard, BE PATIENT. Try to hold up to 10 seconds repeating 10x. Try 2x a day. Once you feel you are doing this well, add this contraction to exercise #2. First contracting your pelvic floor followed by lower abdominals.   4. Adding leg movements. Add the following leg movements to challenge your ability to keep your core stable:  1. Single leg drop outs: Laying on your back with knees bent feet flat. Inhale,  dropping one knee outward KEEPING YOUR PELVIS STILL. Exhale as you bring the leg back, simultaneously performing your lower abdominal contraction. Do 5-10 on each leg.   2. Marching: While keeping your pelvis still, lift the right foot a few inches, put it down then lift left foot. This will mimic a march. Start slow to establish control. Once you have control you may speed it up. Do 10-20x. You MUST keep your  lower abdominlas contracted while you march. Breathe naturally    3. Single leg slides: Inhale while you slowly slide one leg out keeping your pelvis still. Only slide your leg as far as you can keep your pelvis still. Exhale as you bring the leg back to the start, contracting the lower abdominals as you do that. Keep your upper body relaxed. Do 5-10 on each side.    [facility] [address] [place], [place] [address] Phone # [phone] Fax [phone]

## 2015-03-02 NOTE — Therapy (Signed)
[facility] [address], [place], [place], [address] Phone: [phone]   Fax:  [phone]  Physical Therapy Treatment  Patient Details  Name: Kathy Duran MRN: [id number] Date of Birth: [birth year] Referring Provider:  Janyth Pupa, DO  Encounter Date: 03/02/2015      PT End of Session - 03/02/15 1539    Visit Number 2   Number of Visits 10  Medicare   Date for PT Re-Evaluation 04/13/15   Authorization Type HUMANA   Authorization Time Period 02/16/2015-04/02/2015   Authorization - Visit Number 2   Authorization - Number of Visits 6   PT Start Time 6599   PT Stop Time 3570   PT Time Calculation (min) 38 min   Activity Tolerance Patient tolerated treatment well   Behavior During Therapy [facility] for tasks assessed/performed      Past Medical History  Diagnosis Date  . Sinus complaint   . Bilateral swelling of feet   . Hypertension   . Asthma   . Bruises easily   . Anxiety     Past Surgical History  Procedure Laterality Date  . Total hip arthroplasty Right 2006  . Shoulder surgery Right   . Back surgery    . Abdominal hysterectomy      There were no vitals filed for this visit.  Visit Diagnosis:  Pelvic floor weakness in female      Subjective Assessment - 03/02/15 1551    Subjective I have been working on my exercises and focusing on contracting the pelvic floor. Coffee and Tea irritate my bladder.  I have had more accidents this week due to incrased stress.    Patient Stated Goals reduce urinary incontinence   Currently in Pain? No/denies                         [facility] Adult PT Treatment/Exercise - 03/02/15 0001    Exercises   Exercises Lumbar   Lumbar Exercises: Supine   Ab Set 10 reps;5 seconds   AB Set Limitations pelvic contraction   Clam 10 reps  right, left, pelvic floor contraction   Clam Limitations pelvic floor contraction   Heel Slides 10 reps  right, left,  pelvic floor contraction   Bent Knee Raise 10 reps  right, left, pelvic floor contraction                PT Education - 03/02/15 1606    Education provided Yes   Education Details urge to void, transverse abdominus series; diaphragmatic breathing   Person(s) Educated Patient   Methods Explanation;Demonstration;Tactile cues;Verbal cues;Handout   Comprehension Returned demonstration;Verbalized understanding          PT Short Term Goals - 03/02/15 1540    PT SHORT TERM GOAL #1   Title understand what bladder irritants are and how they affect the bladder   Time 4   Period Weeks   Status Achieved   PT SHORT TERM GOAL #2   Title understand how to contract pelvic floor muscles correctly without substitution and holding her breath   Time 4   Period Weeks   Status On-going  just started therapy   PT SHORT TERM GOAL #3   Title able to urinate every 1 1/2 hours due to using the urge to void technique   Time 4   Period Weeks   Status On-going  just starting therapy  PT Long Term Goals - 02/16/15 1634    PT LONG TERM GOAL #1   Title independent with HEP and understand how to advance herself   Time 8   Period Weeks   Status New   PT LONG TERM GOAL #2   Title urinate every 2-3 hours    Time 8   Period Weeks   Status New   PT LONG TERM GOAL #3   Title pelvic floor strength is 4/5 due to no urinary leakage for 3 weeks   Time 8   Period Weeks   Status New   PT LONG TERM GOAL #4   Title urgency decreased >/= 75%   Time 8   Period Weeks   Status New               Plan - 03/02/15 1607    Clinical Impression Statement Patient is a 69 year old female with diagnosis of urinary urgency incontinence.  Patient has met STG#1.  Patient reports bladder irritant including coffee and tea increase her urgency and leakage. Patient is able to contract her abdominals correctly  while contracting her pelvic floor.  Patient continues to have tenderness in levator  ani. Patient would benenfit from physical therapy to increase pelvic strenth and reduce urgency to urinate.    Pt will benefit from skilled therapeutic intervention in order to improve on the following deficits Increased muscle spasms;Decreased endurance;Decreased activity tolerance;Pain;Decreased strength   Rehab Potential Excellent   Clinical Impairments Affecting Rehab Potential None   PT Frequency 1x / week   PT Duration 8 weeks   PT Treatment/Interventions Biofeedback;Therapeutic activities;Therapeutic exercise;Neuromuscular re-education;Manual techniques;Patient/family education   PT Next Visit Plan soft tissue work   PT Home Exercise Plan progress as needed   Consulted and Agree with Plan of Care Patient        Problem List Patient Active Problem List   Diagnosis Date Noted  . Rheumatoid arthritis 11/20/2013  . Low back pain 11/20/2013  . HTN (hypertension) 11/20/2013  . GERD (gastroesophageal reflux disease) 11/20/2013  . Bladder spasm 11/20/2013  . Hyperlipidemia 11/20/2013  . Status post right hip replacement 11/20/2013  . Bilateral swelling of feet   . Bruises easily     Neyra Pettie,PT 03/02/2015, 4:13 PM  [facility] [address], [place], [place], [address] Phone: [phone]   Fax:  [phone]

## 2015-03-09 ENCOUNTER — Ambulatory Visit: Payer: Medicare PPO | Admitting: Physical Therapy

## 2015-03-09 ENCOUNTER — Encounter: Payer: Self-pay | Admitting: Physical Therapy

## 2015-03-09 DIAGNOSIS — N8189 Other female genital prolapse: Secondary | ICD-10-CM

## 2015-03-09 NOTE — Therapy (Signed)
[facility] [address] [place], [place], [address] Phone: [phone]   Fax:  [phone]  Physical Therapy Treatment  Patient Details  Name: Kathy Duran MRN: [id number] Date of Birth: 1946-07-04 Referring Provider:  Janyth Pupa, DO  Encounter Date: 03/09/2015      PT End of Session - 03/09/15 1536    Visit Number 3   Number of Visits 10  medicare   Date for PT Re-Evaluation 04/13/15   Authorization Type HUMANA   Authorization Time Period 02/16/2015-04/02/2015   Authorization - Visit Number 3   Authorization - Number of Visits 6   PT Start Time 6010   PT Stop Time 1610   PT Time Calculation (min) 40 min   Activity Tolerance Patient tolerated treatment well   Behavior During Therapy [facility] for tasks assessed/performed      Past Medical History  Diagnosis Date  . Sinus complaint   . Bilateral swelling of feet   . Hypertension   . Asthma   . Bruises easily   . Anxiety     Past Surgical History  Procedure Laterality Date  . Total hip arthroplasty Right 2006  . Shoulder surgery Right   . Back surgery    . Abdominal hysterectomy      There were no vitals filed for this visit.  Visit Diagnosis:  Pelvic floor weakness in female      Subjective Assessment - 03/09/15 1537    Subjective I had a spasm in my back and had to change exercise. I feel the exercise with hip rotation flared me up. This has been a good week and have been able to wait every 1 1/2 hours. Leaked urine after coffee.    Patient Stated Goals reduce urinary incontinence   Currently in Pain? Yes   Pain Score 2    Pain Location Back   Pain Orientation Left   Pain Descriptors / Indicators Sharp   Pain Type Acute pain   Pain Onset In the past 7 days   Pain Frequency Intermittent   Aggravating Factors  hip rotating outward   Pain Relieving Factors elevate feet, and stretches, husband massaging   Multiple Pain Sites No                       Pelvic Floor Special Questions - 03/09/15 0001    Pelvic Floor Internal Exam Patient confirms identification and approve physical therapist to assess muscle strength and integrity   Exam Type Vaginal   Palpation tenderness located in left obturator internist   Strength good squeeze, good lift, able to hold agaisnt strong resistance           [facility] Adult PT Treatment/Exercise - 03/09/15 0001    Self-Care   Self-Care Other Self-Care Comments   Other Self-Care Comments  bladder irritants, urge to void, delay voiding   Manual Therapy   Manual Therapy Soft tissue mobilization;Internal Pelvic Floor   Soft tissue mobilization left obturator internist with hip movements                PT Education - 03/09/15 1602    Education provided Yes   Education Details wait to urinate for 1 hour 45 minutes   Person(s) Educated Patient   Methods Explanation   Comprehension Verbalized understanding          PT Short Term Goals - 03/09/15 1539    PT SHORT TERM GOAL #1   Title understand  what bladder irritants are and how they affect the bladder   Time 4   Period Weeks   Status Achieved   PT SHORT TERM GOAL #2   Title understand how to contract pelvic floor muscles correctly without substitution and holding her breath   Time 4   Period Weeks   Status Achieved   PT SHORT TERM GOAL #3   Title able to urinate every 1 1/2 hours due to using the urge to void technique   Time 4   Period Weeks   Status Achieved           PT Long Term Goals - 02/16/15 1634    PT LONG TERM GOAL #1   Title independent with HEP and understand how to advance herself   Time 8   Period Weeks   Status New   PT LONG TERM GOAL #2   Title urinate every 2-3 hours    Time 8   Period Weeks   Status New   PT LONG TERM GOAL #3   Title pelvic floor strength is 4/5 due to no urinary leakage for 3 weeks   Time 8   Period Weeks   Status New   PT LONG TERM GOAL #4    Title urgency decreased >/= 75%   Time 8   Period Weeks   Status New               Plan - 03/09/15 1603    Clinical Impression Statement Patient is a 69 year old female with diagnosis of urinary urgency incontinence.  Patient has met all of her STG's.  Patient pelvic floor strength is 4/5 with circular contraction and lift. Palpable tenderness located in left obturator internist.  Patient is now able to wait 1 hour and 30 minutes before she needs to urinate.  Patient will benefit from physical therapy to imporve pelvic floor strength, reduce urge to urinate, and decrease muscle spasm in left obturator internist.    Pt will benefit from skilled therapeutic intervention in order to improve on the following deficits Increased muscle spasms;Decreased endurance;Decreased activity tolerance;Pain;Decreased strength   Rehab Potential Excellent   Clinical Impairments Affecting Rehab Potential None   PT Frequency 1x / week   PT Duration 8 weeks   PT Treatment/Interventions Biofeedback;Therapeutic activities;Therapeutic exercise;Neuromuscular re-education;Manual techniques;Patient/family education   PT Next Visit Plan soft tissue work; pelvic contraction in sitting   PT Home Exercise Plan progress as needed   Consulted and Agree with Plan of Care Patient        Problem List Patient Active Problem List   Diagnosis Date Noted  . Rheumatoid arthritis 11/20/2013  . Low back pain 11/20/2013  . HTN (hypertension) 11/20/2013  . GERD (gastroesophageal reflux disease) 11/20/2013  . Bladder spasm 11/20/2013  . Hyperlipidemia 11/20/2013  . Status post right hip replacement 11/20/2013  . Bilateral swelling of feet   . Bruises easily     GRAY,CHERYL,PT 03/09/2015, 4:07 PM  [facility] [address], [place], [place], [address] Phone: [phone]   Fax:  [phone]

## 2015-03-18 ENCOUNTER — Encounter: Payer: Medicare PPO | Admitting: Physical Therapy

## 2015-03-22 ENCOUNTER — Ambulatory Visit: Payer: Medicare PPO | Admitting: Physical Therapy

## 2015-03-22 ENCOUNTER — Encounter: Payer: Self-pay | Admitting: Physical Therapy

## 2015-03-22 DIAGNOSIS — N8189 Other female genital prolapse: Secondary | ICD-10-CM | POA: Diagnosis not present

## 2015-03-22 NOTE — Patient Instructions (Signed)
Quick Contraction: Gravity Resisted (Sitting)   Sitting, quickly squeeze then fully relax pelvic floor. Perform _1__ sets of _10__. Rest for _1__ seconds between sets. Do __3_ times a day.  Copyright  VHI. All rights reserved.  Slow Contraction: Gravity Resisted (Sitting)   Sitting, slowly squeeze pelvic floor for _19__ seconds. Rest for _5__ seconds. Repeat _10__ times. Do _3__ times a day.  Copyright  VHI. All rights reserved.  [facility] [address], [address] [place], [place] [address] Phone # [phone] Fax [phone]

## 2015-03-22 NOTE — Therapy (Signed)
[facility] [address] [place], [place], [address] Phone: [phone]   Fax:  [phone]  Physical Therapy Treatment  Patient Details  Name: Kathy Duran MRN: [id number] Date of Birth: July 20, 1946 Referring Provider:  Janyth Pupa, DO  Encounter Date: 03/22/2015      PT End of Session - 03/22/15 0938    Visit Number 4   Number of Visits 10  Medicare   Date for PT Re-Evaluation 04/13/15   Authorization Type HU[employer]   Authorization Time Period 02/16/2015-04/02/2015   Authorization - Visit Number 4   Authorization - Number of Visits 6   PT Start Time 0935   PT Stop Time 1006   PT Time Calculation (min) 31 min   Activity Tolerance Patient tolerated treatment well   Behavior During Therapy [facility] for tasks assessed/performed      Past Medical History  Diagnosis Date  . Sinus complaint   . Bilateral swelling of feet   . Hypertension   . Asthma   . Bruises easily   . Anxiety     Past Surgical History  Procedure Laterality Date  . Total hip arthroplasty Right 2006  . Shoulder surgery Right   . Back surgery    . Abdominal hysterectomy      There were no vitals filed for this visit.  Visit Diagnosis:  Pelvic floor weakness in female      Subjective Assessment - 03/22/15 0940    Subjective I am doing well. I have not had left hip pain. I was able to wait for 6 hours to not urinate. I feel like my left hip has been released.     Patient Stated Goals reduce urinary incontinence   Currently in Pain? No/denies            [facility] PT Assessment - 03/22/15 0001    Assessment   Medical Diagnosis NN39.41 urinary Incontinence Urgency   Onset Date/Surgical Date 05/24/15   Prior Therapy None   Strength   Overall Strength Comments bil. hip abduction 3/5                  Pelvic Floor Special Questions - 03/22/15 0001    Pelvic Floor Internal Exam Patient confirms identification and approve  physical therapist to assess muscle strength and integrity   Exam Type Vaginal   Palpation tenderness located in left levator ani and left obturator internist   Strength good squeeze, good lift, able to hold agaisnt strong resistance           [facility] Adult PT Treatment/Exercise - 03/22/15 0001    Manual Therapy   Manual Therapy Soft tissue mobilization;Internal Pelvic Floor   Soft tissue mobilization left obturator internist with hip movements; left iliococcygeus   Internal Pelvic Floor left levator ani and obturator internist                PT Education - 03/22/15 1008    Education provided Yes   Education Details pelvic floor contraction in sitting   Person(s) Educated Patient   Methods Explanation;Demonstration;Verbal cues;Handout   Comprehension Returned demonstration;Verbalized understanding          PT Short Term Goals - 03/09/15 1539    PT SHORT TERM GOAL #1   Title understand what bladder irritants are and how they affect the bladder   Time 4   Period Weeks   Status Achieved   PT SHORT TERM GOAL #2   Title understand how to contract  pelvic floor muscles correctly without substitution and holding her breath   Time 4   Period Weeks   Status Achieved   PT SHORT TERM GOAL #3   Title able to urinate every 1 1/2 hours due to using the urge to void technique   Time 4   Period Weeks   Status Achieved           PT Long Term Goals - 03/22/15 0944    PT LONG TERM GOAL #1   Title independent with HEP and understand how to advance herself   Time 8   Period Weeks   Status On-going  still learning exercises   PT LONG TERM GOAL #2   Title urinate every 2-3 hours    Time 8   Period Weeks   Status Achieved   PT LONG TERM GOAL #3   Title pelvic floor strength is 4/5 due to no urinary leakage for 3 weeks   Time 8   Period Weeks   Status On-going  strength is 4/5, 2x urinary leakage   PT LONG TERM GOAL #4   Title urgency decreased >/= 75%   Time 8    Period Weeks   Status On-going  1 time had trouble               Plan - 03/22/15 1008    Clinical Impression Statement Patient is a 69 year old female with diagnosis of urinary urgency incontinence.  Patient has met LTG # 2.  Patient reports no pain in left hip and after internal soft tissue work her left hip felt free.  Patient reports on 2 times of urinary leakage at night.  Patient has been able to hold her urine for 6 hours when she is distracted. Pelvic floor strength is 4/5 . Bil. hip abduction strength is 3/5. Patient  would benefit from physical therapy to improve pelvic floor strength and decrease trigger points in left pelvic floor muscles.    Pt will benefit from skilled therapeutic intervention in order to improve on the following deficits Increased muscle spasms;Decreased endurance;Decreased activity tolerance;Pain;Decreased strength   Rehab Potential Excellent   Clinical Impairments Affecting Rehab Potential None   PT Frequency 1x / week   PT Duration 8 weeks   PT Treatment/Interventions Biofeedback;Therapeutic activities;Therapeutic exercise;Neuromuscular re-education;Manual techniques;Patient/family education   PT Next Visit Plan hip strength for abduction, assess if patient is to continue or be discharged   PT Home Exercise Plan progress as needed   Consulted and Agree with Plan of Care Patient        Problem List Patient Active Problem List   Diagnosis Date Noted  . Rheumatoid arthritis 11/20/2013  . Low back pain 11/20/2013  . HTN (hypertension) 11/20/2013  . GERD (gastroesophageal reflux disease) 11/20/2013  . Bladder spasm 11/20/2013  . Hyperlipidemia 11/20/2013  . Status post right hip replacement 11/20/2013  . Bilateral swelling of feet   . Bruises easily     Shalia Bartko,PT 03/22/2015, 10:13 AM  [facility] [address], [place], [place], [address] Phone: [phone]   Fax:   [phone]

## 2015-03-29 ENCOUNTER — Ambulatory Visit: Payer: Medicare PPO | Attending: Obstetrics & Gynecology | Admitting: Physical Therapy

## 2015-03-29 ENCOUNTER — Encounter: Payer: Self-pay | Admitting: Physical Therapy

## 2015-03-29 DIAGNOSIS — N8189 Other female genital prolapse: Secondary | ICD-10-CM | POA: Diagnosis not present

## 2015-03-29 NOTE — Therapy (Signed)
[facility] [address] [place], [place], [address] Phone: [phone]   Fax:  [phone]  Physical Therapy Treatment  Patient Details  Name: Kathy Duran MRN: [id number] Date of Birth: 04-27-1946 Referring Provider:  Janyth Pupa, DO  Encounter Date: 03/29/2015      PT End of Session - 03/29/15 1237    Visit Number 5   Number of Visits 10   Date for PT Re-Evaluation 04/13/15   Authorization Type HUMANA   Authorization Time Period 02/16/2015-04/02/2015   Authorization - Visit Number 5   Authorization - Number of Visits 6   PT Start Time 1230   PT Stop Time 1258   PT Time Calculation (min) 28 min   Activity Tolerance Patient tolerated treatment well   Behavior During Therapy [facility] for tasks assessed/performed      Past Medical History  Diagnosis Date  . Sinus complaint   . Bilateral swelling of feet   . Hypertension   . Asthma   . Bruises easily   . Anxiety     Past Surgical History  Procedure Laterality Date  . Total hip arthroplasty Right 2006  . Shoulder surgery Right   . Back surgery    . Abdominal hysterectomy      There were no vitals filed for this visit.  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 I only get up 1 time per night instead of 3 times.  Tea is my trigger.  I only have urgency when I drink tea. No pads or urinary leakage.  No panic mode if I have to go to the bathroom.    Patient Stated Goals reduce urinary incontinence   Currently in Pain? No/denies            [facility] PT Assessment - 03/29/15 0001    Assessment   Medical Diagnosis NN39.41 urinary Incontinence Urgency   Onset Date/Surgical Date 05/24/15   Prior Therapy None   Precautions   Precautions Posterior Hip   Precaution Comments Right hip replacement   Balance Screen   Has the patient  fallen in the past 6 months No   Has the patient had a decrease in activity level because of a fear of falling?  No   Is the patient reluctant to leave their home because of a fear of falling?  No   Prior Function   Level of Independence Independent   Observation/Other Assessments   Focus on Therapeutic Outcomes (FOTO)  8% limitation   Strength   Overall Strength Comments bil. hip abduction is 5/5                     [facility] Adult PT Treatment/Exercise - 03/29/15 0001    Lumbar Exercises: Supine   Ab Set 10 reps  10 second pelvic floor contraction sitting   AB Set Limitations 10 quick flicks 2 times with pelvic floor contraction   Heel Slides 10 reps  right, left, pelvic floor contraction   Bent Knee Raise 10 reps  right, left, pelvic floor contraction                PT Education - 03/29/15 1259    Education provided Yes   Education Details reviewed past HEP and patient is independent   Forensic psychologist) Educated Patient   Methods  Explanation;Demonstration   Comprehension Verbalized understanding;Returned demonstration          PT Short Term Goals - 03/09/15 1539    PT SHORT TERM GOAL #1   Title understand what bladder irritants are and how they affect the bladder   Time 4   Period Weeks   Status Achieved   PT SHORT TERM GOAL #2   Title understand how to contract pelvic floor muscles correctly without substitution and holding her breath   Time 4   Period Weeks   Status Achieved   PT SHORT TERM GOAL #3   Title able to urinate every 1 1/2 hours due to using the urge to void technique   Time 4   Period Weeks   Status Achieved           PT Long Term Goals - 30-Mar-2015 1232    PT LONG TERM GOAL #1   Title independent with HEP and understand how to advance herself   Time 8   Period Weeks   Status Achieved   PT LONG TERM GOAL #2   Title urinate every 2-3 hours    Time 8   Period Weeks   Status Achieved   PT LONG TERM GOAL #3   Title pelvic floor  strength is 4/5 due to no urinary leakage for 3 weeks   Time 8   Period Weeks   Status Achieved   PT LONG TERM GOAL #4   Title urgency decreased >/= 75%   Time 8   Period Weeks   Status Achieved               Plan - 03-30-2015 1238    Clinical Impression Statement Patient is a 69 year old female with diagnosis of urinary urgency incontinence.  Patient reports she only has the urgency when she drinks tea. She does not have to think where the bathroom is when she goes somewhere.  Patient  wakes up 1 time per night compared to 3 times. FOTO score is 8% limitaiton.  Patient has met her goals and is ready for discharge.    Pt will benefit from skilled therapeutic intervention in order to improve on the following deficits Increased muscle spasms;Decreased endurance;Decreased activity tolerance;Pain;Decreased strength   Rehab Potential Excellent   Clinical Impairments Affecting Rehab Potential None   PT Treatment/Interventions Biofeedback;Therapeutic activities;Therapeutic exercise;Neuromuscular re-education;Manual techniques;Patient/family education   PT Next Visit Plan Discharge to HEP   PT Home Exercise Plan Current HEP   Consulted and Agree with Plan of Care Patient          G-Codes - 03/30/15 1244    Functional Assessment Tool Used FOTO score for PFDI urinary is 8% limitation   Functional Limitation Other PT primary   Other PT Primary Goal Status (O2774) At least 20 percent but less than 40 percent impaired, limited or restricted   Other PT Primary Discharge Status (J2878) At least 1 percent but less than 20 percent impaired, limited or restricted      Problem List Patient Active Problem List   Diagnosis Date Noted  . Rheumatoid arthritis 11/20/2013  . Low back pain 11/20/2013  . HTN (hypertension) 11/20/2013  . GERD (gastroesophageal reflux disease) 11/20/2013  . Bladder spasm 11/20/2013  . Hyperlipidemia 11/20/2013  . Status post right hip replacement 11/20/2013  .  Bilateral swelling of feet   . Bruises easily     Kodi Steil,PT 30-Mar-2015, 1:00 PM  [facility] [address]  [place], [place], [address] Phone: [phone]   Fax:  [phone]   PHYSICAL THERAPY DISCHARGE SUMMARY  Visits from Start of Care: 5  Current functional level related to goals / functional outcomes: See above   Remaining deficits: See above   Education / Equipment: HEP  Plan: Patient agrees to discharge.  Patient goals were met. Patient is being discharged due to meeting the stated rehab goals.  Thank you for the referral. Earlie Counts, PT 03/29/2015 12:42 PM  ?????

## 2015-03-30 DIAGNOSIS — M3500 Sicca syndrome, unspecified: Secondary | ICD-10-CM | POA: Diagnosis not present

## 2015-03-30 DIAGNOSIS — M542 Cervicalgia: Secondary | ICD-10-CM | POA: Diagnosis not present

## 2015-03-30 DIAGNOSIS — R200 Anesthesia of skin: Secondary | ICD-10-CM | POA: Diagnosis not present

## 2015-03-30 DIAGNOSIS — M25521 Pain in right elbow: Secondary | ICD-10-CM | POA: Diagnosis not present

## 2015-03-30 DIAGNOSIS — Z09 Encounter for follow-up examination after completed treatment for conditions other than malignant neoplasm: Secondary | ICD-10-CM | POA: Diagnosis not present

## 2015-04-05 ENCOUNTER — Other Ambulatory Visit (HOSPITAL_COMMUNITY): Payer: Self-pay | Admitting: Rheumatology

## 2015-04-05 DIAGNOSIS — M542 Cervicalgia: Secondary | ICD-10-CM

## 2015-04-06 ENCOUNTER — Other Ambulatory Visit: Payer: Self-pay | Admitting: Internal Medicine

## 2015-04-11 ENCOUNTER — Ambulatory Visit (HOSPITAL_COMMUNITY)
Admission: RE | Admit: 2015-04-11 | Discharge: 2015-04-11 | Disposition: A | Discharge status: Home / Self Care | Payer: Medicare PPO | Source: Ambulatory Visit | Attending: Rheumatology | Admitting: Rheumatology

## 2015-04-11 DIAGNOSIS — M4802 Spinal stenosis, cervical region: Secondary | ICD-10-CM | POA: Insufficient documentation

## 2015-04-11 DIAGNOSIS — M5412 Radiculopathy, cervical region: Secondary | ICD-10-CM | POA: Insufficient documentation

## 2015-04-11 DIAGNOSIS — M542 Cervicalgia: Secondary | ICD-10-CM | POA: Diagnosis not present

## 2015-04-19 ENCOUNTER — Telehealth: Payer: Self-pay | Admitting: Internal Medicine

## 2015-04-19 NOTE — Telephone Encounter (Signed)
Closed encounter °

## 2015-04-20 ENCOUNTER — Encounter: Payer: Self-pay | Admitting: Internal Medicine

## 2015-04-20 ENCOUNTER — Ambulatory Visit (INDEPENDENT_AMBULATORY_CARE_PROVIDER_SITE_OTHER): Payer: Medicare PPO | Admitting: Internal Medicine

## 2015-04-20 VITALS — BP 138/92 | HR 90 | Ht 65.0 in | Wt 197.1 lb

## 2015-04-20 DIAGNOSIS — E785 Hyperlipidemia, unspecified: Secondary | ICD-10-CM | POA: Diagnosis not present

## 2015-04-20 DIAGNOSIS — I8390 Asymptomatic varicose veins of unspecified lower extremity: Secondary | ICD-10-CM

## 2015-04-20 DIAGNOSIS — M069 Rheumatoid arthritis, unspecified: Secondary | ICD-10-CM

## 2015-04-20 DIAGNOSIS — I868 Varicose veins of other specified sites: Secondary | ICD-10-CM

## 2015-04-20 DIAGNOSIS — I8393 Asymptomatic varicose veins of bilateral lower extremities: Secondary | ICD-10-CM | POA: Diagnosis not present

## 2015-04-20 DIAGNOSIS — R0602 Shortness of breath: Secondary | ICD-10-CM | POA: Diagnosis not present

## 2015-04-20 DIAGNOSIS — I10 Essential (primary) hypertension: Secondary | ICD-10-CM

## 2015-04-20 DIAGNOSIS — M7989 Other specified soft tissue disorders: Secondary | ICD-10-CM

## 2015-04-20 DIAGNOSIS — R600 Localized edema: Secondary | ICD-10-CM | POA: Diagnosis not present

## 2015-04-20 NOTE — Progress Notes (Signed)
OFFICE NOTE  Chief Complaint:  Leg swelling  Primary Care Physician: Georgann Housekeeper, MD  HPI:  Kathy Duran is a pleasant 69 year old female with a history of premature arthritis, hypertension, dyslipidemia, gout bladder spasm, GERD and low back pain. She's also had right hip surgery.  She is a self-referral for evaluation of lower stringy swelling. She previously seen Dr. Newt Lukes in 2007 in May that a stress test at that time. She's managed to use some compression stockings for her swelling which worked. She reports the swelling is gotten worse during the day which is on her feet but improves after she elevates it at night. She's had a limited workup of her swelling before occluding venous Dopplers which showed no evidence of a DVT in 2011. She denies any varicose veins. She has been having some problems with sores on her feet. She is seeing Dr. Leary Roca in [facility]. She denies any shortness of breath or chest pain. She has no orthopnea or PND.  Kathy Duran returns today for followup of her venous Dopplers. This did not show any evidence for venous insufficiency. There was no evidence for deep vein thrombosis. She continues to have some swelling mostly in her left foot. She is now back in a walking boot. Her renal function is normal and BNP was low indicating this is not likely heart failure. 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 In fact in her right hand it is so significant particularly toward the wrist that she is undergoing carpal tunnel surgery later this week.  Kathy Duran he was seen back in the office today in follow-up. Overall she seems to be doing fairly well although continues to have concerns about leg swelling. At times she reports some swelling in her legs including today. She's had venous Dopplers last year which failed to show any venous insufficiency. She's concerned that there may  be a cardiac etiology of this. She denies any significant shortness of breath, orthopnea or PND.  PMHx:  Past Medical History  Diagnosis Date  . Sinus complaint   . Bilateral swelling of feet   . Hypertension   . Asthma   . Bruises easily   . Anxiety     Past Surgical History  Procedure Laterality Date  . Total hip arthroplasty Right 2006  . Shoulder surgery Right   . Back surgery    . Abdominal hysterectomy      FAMHx:  Family History  Problem Relation Age of Onset  . Heart attack Father   . Diabetes Father   . Cancer Father   . Hypertension Father   . Arrhythmia Mother   . Stroke Mother   . Hyperlipidemia Mother   . Diabetes Mother   . Hypertension Mother   . Hyperlipidemia Maternal Grandmother   . Hypertension Maternal Grandmother   . Heart attack Paternal Grandfather   . Stroke Paternal Grandfather   . Asthma Brother   . Emphysema Brother   . Hypertension Brother   . Diabetes Sister   . Hypertension Sister   . Hyperlipidemia Sister     SOCHx:   reports that she quit smoking about 31 years ago. She has never used smokeless tobacco. She reports that she does not drink alcohol. Her drug history is not on file.  ALLERGIES:  No Known Allergies  ROS: A comprehensive review of systems was negative except for: Cardiovascular: positive for lower extremity  edema Musculoskeletal: positive for arthralgias  HOME MEDS: Current Outpatient Prescriptions  Medication Sig Dispense Refill  . albuterol (PROVENTIL) (2.5 MG/3ML) 0.083% nebulizer solution Take 2.5 mg by nebulization as needed for wheezing.    Marland Kitchen aspirin 81 MG tablet Take 81 mg by mouth daily.    Marland Kitchen atorvastatin (LIPITOR) 40 MG tablet TAKE 1 TABLET (40 MG TOTAL) BY MOUTH DAILY. 30 tablet 0  . benzonatate (TESSALON PERLES) 100 MG capsule Take 1-2 capsules (100-200 mg total) by mouth 3 (three) times daily as needed for cough. 30 capsule 0  . cetirizine (ZYRTEC) 10 MG chewable tablet Chew 10 mg by mouth daily.      . Cholecalciferol (VITAMIN D-3 PO) Take by mouth daily.    . Coenzyme Q10 (CO Q-10 PO) Take by mouth daily.    Marland Kitchen estradiol (VIVELLE-DOT) 0.1 MG/24HR patch Place 1 patch onto the skin 2 (two) times a week.    . Fluticasone-Salmeterol (ADVAIR) 100-50 MCG/DOSE AEPB Inhale 1 puff into the lungs once.    Marland Kitchen guaiFENesin-codeine 100-10 MG/5ML syrup Take 5-10 mLs by mouth every 6 (six) hours as needed for cough. 120 mL 0  . ipratropium (ATROVENT) 0.06 % nasal spray Place 2 sprays into both nostrils 4 (four) times daily. 15 mL 12  . KRILL OIL PO Take by mouth daily.    Marland Kitchen lidocaine (LIDODERM) 5 % Place 1 patch onto the skin as needed. Remove & Discard patch within 12 hours or as directed by MD    . losartan (COZAAR) 50 MG tablet Take 50 mg by mouth daily.    . methocarbamol (ROBAXIN) 500 MG tablet Take 1 tablet by mouth 2 (two) times daily as needed.    . mometasone (NASONEX) 50 MCG/ACT nasal spray Place 2 sprays into the nose daily.    . montelukast (SINGULAIR) 10 MG tablet Take 10 mg by mouth at bedtime as needed.     . Multiple Vitamin (MULTIVITAMIN) capsule Take 1 capsule by mouth daily.    Marland Kitchen MYRBETRIQ 50 MG TB24 tablet Take 1 tablet by mouth daily.    . pantoprazole (PROTONIX) 40 MG tablet Take 40 mg by mouth daily as needed.     . traMADol (ULTRAM) 50 MG tablet Take 50 mg by mouth every 6 (six) hours as needed for pain.    Marland Kitchen zolpidem (AMBIEN) 10 MG tablet Take 1 tablet by mouth at bedtime as needed.    . furosemide (LASIX) 20 MG tablet Take 1 tablet by mouth once a week.    Marland Kitchen KLOR-CON M10 10 MEQ tablet Takes with Lasix.     No current facility-administered medications for this visit.    LABS/IMAGING: No results found for this or any previous visit (from the past 48 hour(s)). No results found.  VITALS: BP 138/92 mmHg  Pulse 90  Ht 5\' 5"  (1.651 m)  Wt 197 lb 1.6 oz (89.404 kg)  BMI 32.80 kg/m2  EXAM: General appearance: alert and no distress Neck: no carotid bruit and no JVD Lungs:  clear to auscultation bilaterally Heart: regular rate and rhythm, S1, S2 normal, no murmur, click, rub or gallop Abdomen: soft, non-tender; bowel sounds normal; no masses,  no organomegaly Extremities: extremities normal, atraumatic, no cyanosis or edema and There is lipidema Pulses: 2+ and symmetric Skin: Skin color, texture, turgor normal. No rashes or lesions Neurologic: Grossly normal Psych: Pleasant  EKG: Normal sinus rhythm at 90  ASSESSMENT: 1. Lipidemia - no pitting edema on exam, negative dopplers for venous reflux last  year 2. Hypertension 3. Dyslipidemia 4. Rheumatoid arthritis  PLAN: 1.   Ms. Eustaquio has very little leg edema which may be due to some neuropathy. She had venous Dopplers last year demonstrating no venous insufficiency. She also has some lipedema - which is fatty tissue deposition around the ankles and the dorsum of the foot. She may wish to consider compression stockings for the times that she is on her feet a lot or sitting for long periods of time. She says she really has some from elastic therapy in [place]. Blood pressure appears to be well-controlled. She is due for repeat lipid profile and will order a lipid NMR today.  Follow-up annually or sooner as necessary.  Chrystie Nose, MD, [facility] Attending Cardiologist [facility]  Lisette Abu Hilty 04/20/2015, 1:09 PM

## 2015-04-20 NOTE — Patient Instructions (Signed)
Your physician has requested that you have an echocardiogram. Echocardiography is a painless test that uses sound waves to create images of your heart. It provides your doctor with information about the size and shape of your heart and how well your heart's chambers and valves are working. This procedure takes approximately one hour. There are no restrictions for this procedure.  Your physician recommends that you return for lab work FASTING  Your physician has requested that you have a lower extremity venous duplex. This test is an ultrasound of the veins in the legs. It looks at venous blood flow that carries blood from the heart to the legs. Allow one hour for a Lower Venous exam. Allow thirty minutes for an Upper Venous exam. There are no restrictions or special instructions.  Your physician recommends that you schedule a follow-up appointment in: 2 months with Dr. Rennis Golden.

## 2015-04-21 ENCOUNTER — Encounter (HOSPITAL_COMMUNITY): Payer: Medicare PPO

## 2015-04-25 DIAGNOSIS — E785 Hyperlipidemia, unspecified: Secondary | ICD-10-CM | POA: Diagnosis not present

## 2015-04-28 LAB — CARDIO IQ(R) ADVANCED LIPID PANEL
Apolipoprotein B: 94 mg/dL (ref 49–103)
Cholesterol, Total: 245 mg/dL — ABNORMAL HIGH (ref 125–200)
Cholesterol/HDL Ratio: 2.3 calc (ref <=5.0)
HDL Cholesterol: 107 mg/dL (ref >=46)
LDL Large: 8928 nmol/L (ref 5038–17886)
LDL Medium: 229 nmol/L (ref 121–397)
LDL Particle Number: 1249 nmol/L (ref 1016–2185)
LDL Peak Size: 233.1 Angstrom (ref >=218.2)
LDL Small: 177 nmol/L (ref 115–386)
LDL, Calculated: 129 mg/dL
Lipoprotein (a): 333 nmol/L — ABNORMAL HIGH (ref <75)
Non-HDL Cholesterol: 138 mg/dL
Triglycerides: 47 mg/dL

## 2015-05-02 ENCOUNTER — Telehealth: Payer: Self-pay | Admitting: Internal Medicine

## 2015-05-02 NOTE — Telephone Encounter (Signed)
Pt is returning a nurses call about some blood test results. Please f/u with her  Thanks

## 2015-05-02 NOTE — Telephone Encounter (Signed)
Returned call to patient.Lab results given. 

## 2015-05-09 ENCOUNTER — Other Ambulatory Visit: Payer: Self-pay

## 2015-05-09 ENCOUNTER — Ambulatory Visit (HOSPITAL_COMMUNITY): Payer: Medicare PPO | Attending: Cardiovascular Disease

## 2015-05-09 DIAGNOSIS — I253 Aneurysm of heart: Secondary | ICD-10-CM | POA: Diagnosis not present

## 2015-05-09 DIAGNOSIS — I517 Cardiomegaly: Secondary | ICD-10-CM | POA: Diagnosis not present

## 2015-05-09 DIAGNOSIS — I351 Nonrheumatic aortic (valve) insufficiency: Secondary | ICD-10-CM | POA: Insufficient documentation

## 2015-05-09 DIAGNOSIS — R0600 Dyspnea, unspecified: Secondary | ICD-10-CM | POA: Diagnosis not present

## 2015-05-09 DIAGNOSIS — I071 Rheumatic tricuspid insufficiency: Secondary | ICD-10-CM | POA: Diagnosis not present

## 2015-05-09 DIAGNOSIS — R0602 Shortness of breath: Secondary | ICD-10-CM

## 2015-05-09 DIAGNOSIS — R600 Localized edema: Secondary | ICD-10-CM

## 2015-05-10 ENCOUNTER — Other Ambulatory Visit: Payer: Self-pay | Admitting: Internal Medicine

## 2015-05-12 DIAGNOSIS — Z6833 Body mass index (BMI) 33.0-33.9, adult: Secondary | ICD-10-CM | POA: Diagnosis not present

## 2015-05-12 DIAGNOSIS — M5023 Other cervical disc displacement, cervicothoracic region: Secondary | ICD-10-CM | POA: Diagnosis not present

## 2015-05-12 DIAGNOSIS — M542 Cervicalgia: Secondary | ICD-10-CM | POA: Diagnosis not present

## 2015-05-12 DIAGNOSIS — M5412 Radiculopathy, cervical region: Secondary | ICD-10-CM | POA: Diagnosis not present

## 2015-05-12 DIAGNOSIS — M5413 Radiculopathy, cervicothoracic region: Secondary | ICD-10-CM | POA: Diagnosis not present

## 2015-05-23 DIAGNOSIS — M2560 Stiffness of unspecified joint, not elsewhere classified: Secondary | ICD-10-CM | POA: Diagnosis not present

## 2015-05-23 DIAGNOSIS — R293 Abnormal posture: Secondary | ICD-10-CM | POA: Diagnosis not present

## 2015-05-23 DIAGNOSIS — M6281 Muscle weakness (generalized): Secondary | ICD-10-CM | POA: Diagnosis not present

## 2015-05-23 DIAGNOSIS — M542 Cervicalgia: Secondary | ICD-10-CM | POA: Diagnosis not present

## 2015-05-25 ENCOUNTER — Encounter: Payer: Self-pay | Admitting: Internal Medicine

## 2015-05-30 DIAGNOSIS — M542 Cervicalgia: Secondary | ICD-10-CM | POA: Diagnosis not present

## 2015-05-30 DIAGNOSIS — M2560 Stiffness of unspecified joint, not elsewhere classified: Secondary | ICD-10-CM | POA: Diagnosis not present

## 2015-05-30 DIAGNOSIS — R293 Abnormal posture: Secondary | ICD-10-CM | POA: Diagnosis not present

## 2015-05-30 DIAGNOSIS — M6281 Muscle weakness (generalized): Secondary | ICD-10-CM | POA: Diagnosis not present

## 2015-06-01 DIAGNOSIS — M6281 Muscle weakness (generalized): Secondary | ICD-10-CM | POA: Diagnosis not present

## 2015-06-01 DIAGNOSIS — M542 Cervicalgia: Secondary | ICD-10-CM | POA: Diagnosis not present

## 2015-06-01 DIAGNOSIS — R293 Abnormal posture: Secondary | ICD-10-CM | POA: Diagnosis not present

## 2015-06-01 DIAGNOSIS — M2560 Stiffness of unspecified joint, not elsewhere classified: Secondary | ICD-10-CM | POA: Diagnosis not present

## 2015-06-06 DIAGNOSIS — R293 Abnormal posture: Secondary | ICD-10-CM | POA: Diagnosis not present

## 2015-06-06 DIAGNOSIS — M2560 Stiffness of unspecified joint, not elsewhere classified: Secondary | ICD-10-CM | POA: Diagnosis not present

## 2015-06-06 DIAGNOSIS — M6281 Muscle weakness (generalized): Secondary | ICD-10-CM | POA: Diagnosis not present

## 2015-06-06 DIAGNOSIS — M542 Cervicalgia: Secondary | ICD-10-CM | POA: Diagnosis not present

## 2015-06-08 DIAGNOSIS — M542 Cervicalgia: Secondary | ICD-10-CM | POA: Diagnosis not present

## 2015-06-08 DIAGNOSIS — M2560 Stiffness of unspecified joint, not elsewhere classified: Secondary | ICD-10-CM | POA: Diagnosis not present

## 2015-06-08 DIAGNOSIS — R293 Abnormal posture: Secondary | ICD-10-CM | POA: Diagnosis not present

## 2015-06-08 DIAGNOSIS — M6281 Muscle weakness (generalized): Secondary | ICD-10-CM | POA: Diagnosis not present

## 2015-06-13 DIAGNOSIS — M2560 Stiffness of unspecified joint, not elsewhere classified: Secondary | ICD-10-CM | POA: Diagnosis not present

## 2015-06-13 DIAGNOSIS — M6281 Muscle weakness (generalized): Secondary | ICD-10-CM | POA: Diagnosis not present

## 2015-06-13 DIAGNOSIS — R293 Abnormal posture: Secondary | ICD-10-CM | POA: Diagnosis not present

## 2015-06-13 DIAGNOSIS — M542 Cervicalgia: Secondary | ICD-10-CM | POA: Diagnosis not present

## 2015-06-15 DIAGNOSIS — R293 Abnormal posture: Secondary | ICD-10-CM | POA: Diagnosis not present

## 2015-06-15 DIAGNOSIS — M6281 Muscle weakness (generalized): Secondary | ICD-10-CM | POA: Diagnosis not present

## 2015-06-15 DIAGNOSIS — M542 Cervicalgia: Secondary | ICD-10-CM | POA: Diagnosis not present

## 2015-06-15 DIAGNOSIS — M2560 Stiffness of unspecified joint, not elsewhere classified: Secondary | ICD-10-CM | POA: Diagnosis not present

## 2015-06-20 DIAGNOSIS — M6281 Muscle weakness (generalized): Secondary | ICD-10-CM | POA: Diagnosis not present

## 2015-06-20 DIAGNOSIS — M2560 Stiffness of unspecified joint, not elsewhere classified: Secondary | ICD-10-CM | POA: Diagnosis not present

## 2015-06-20 DIAGNOSIS — R293 Abnormal posture: Secondary | ICD-10-CM | POA: Diagnosis not present

## 2015-06-20 DIAGNOSIS — M542 Cervicalgia: Secondary | ICD-10-CM | POA: Diagnosis not present

## 2015-06-22 ENCOUNTER — Ambulatory Visit (INDEPENDENT_AMBULATORY_CARE_PROVIDER_SITE_OTHER): Payer: Medicare PPO | Admitting: Internal Medicine

## 2015-06-22 ENCOUNTER — Encounter: Payer: Self-pay | Admitting: Internal Medicine

## 2015-06-22 VITALS — BP 134/90 | HR 88 | Ht 65.0 in | Wt 204.7 lb

## 2015-06-22 DIAGNOSIS — I868 Varicose veins of other specified sites: Secondary | ICD-10-CM | POA: Diagnosis not present

## 2015-06-22 DIAGNOSIS — E785 Hyperlipidemia, unspecified: Secondary | ICD-10-CM

## 2015-06-22 DIAGNOSIS — M7989 Other specified soft tissue disorders: Secondary | ICD-10-CM | POA: Diagnosis not present

## 2015-06-22 DIAGNOSIS — I8390 Asymptomatic varicose veins of unspecified lower extremity: Secondary | ICD-10-CM

## 2015-06-22 DIAGNOSIS — Z6834 Body mass index (BMI) 34.0-34.9, adult: Secondary | ICD-10-CM | POA: Diagnosis not present

## 2015-06-22 DIAGNOSIS — M542 Cervicalgia: Secondary | ICD-10-CM | POA: Diagnosis not present

## 2015-06-22 DIAGNOSIS — M5413 Radiculopathy, cervicothoracic region: Secondary | ICD-10-CM | POA: Diagnosis not present

## 2015-06-22 DIAGNOSIS — M5412 Radiculopathy, cervical region: Secondary | ICD-10-CM | POA: Diagnosis not present

## 2015-06-22 DIAGNOSIS — I10 Essential (primary) hypertension: Secondary | ICD-10-CM | POA: Diagnosis not present

## 2015-06-22 NOTE — Patient Instructions (Signed)
Your physician wants you to follow-up in: 1 year or sooner if needed. You will receive a reminder letter in the mail two months in advance. If you don't receive a letter, please call our office to schedule the follow-up appointment.   If you need a refill on your cardiac medications before your next appointment, please call your pharmacy.   

## 2015-06-22 NOTE — Progress Notes (Signed)
OFFICE NOTE  Chief Complaint:  Follow-up echocardiogram  Primary Care Physician: Georgann Housekeeper, MD  HPI:  Kathy Duran is a pleasant 69 year old female with a history of premature arthritis, hypertension, dyslipidemia, gout bladder spasm, GERD and low back pain. She's also had right hip surgery.  She is a self-referral for evaluation of lower stringy swelling. She previously seen Dr. Newt Lukes in 2007 in May that a stress test at that time. She's managed to use some compression stockings for her swelling which worked. She reports the swelling is gotten worse during the day which is on her feet but improves after she elevates it at night. She's had a limited workup of her swelling before occluding venous Dopplers which showed no evidence of a DVT in 2011. She denies any varicose veins. She has been having some problems with sores on her feet. She is seeing Dr. Leary Roca in [facility]. She denies any shortness of breath or chest pain. She has no orthopnea or PND.  Kathy Duran returns today for followup of her venous Dopplers. This did not show any evidence for venous insufficiency. There was no evidence for deep vein thrombosis. She continues to have some swelling mostly in her left foot. She is now back in a walking boot. Her renal function is normal and BNP was low indicating this is not likely heart failure. 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 In fact in her right hand it is so significant particularly toward the wrist that she is undergoing carpal tunnel surgery later this week.  Kathy Duran he was seen back in the office today in follow-up. Overall she seems to be doing fairly well although continues to have concerns about leg swelling. At times she reports some swelling in her legs including today. She's had venous Dopplers last year which failed to show any venous insufficiency. She's concerned  that there may be a cardiac etiology of this. She denies any significant shortness of breath, orthopnea or PND.  I had the pleasure seeing Kathy Duran back in the office today for follow-up. She underwent an echocardiogram which showed normal systolic function mild diastolic dysfunction, but no clear etiology for her lower extremity swelling. She says she's been using compression stockings with a significant improvement in her symptoms.  PMHx:  Past Medical History  Diagnosis Date  . Sinus complaint   . Bilateral swelling of feet   . Hypertension   . Asthma   . Bruises easily   . Anxiety     Past Surgical History  Procedure Laterality Date  . Total hip arthroplasty Right 2006  . Shoulder surgery Right   . Back surgery    . Abdominal hysterectomy      FAMHx:  Family History  Problem Relation Age of Onset  . Heart attack Father   . Diabetes Father   . Cancer Father   . Hypertension Father   . Arrhythmia Mother   . Stroke Mother   . Hyperlipidemia Mother   . Diabetes Mother   . Hypertension Mother   . Hyperlipidemia Maternal Grandmother   . Hypertension Maternal Grandmother   . Heart attack Paternal Grandfather   . Stroke Paternal Grandfather   . Asthma Brother   . Emphysema Brother   . Hypertension Brother   . Diabetes Sister   . Hypertension Sister   . Hyperlipidemia Sister     SOCHx:   reports that  she quit smoking about 31 years ago. She has never used smokeless tobacco. She reports that she does not drink alcohol. Her drug history is not on file.  ALLERGIES:  No Known Allergies  ROS: A comprehensive review of systems was negative except for: Cardiovascular: positive for lower extremity edema Musculoskeletal: positive for arthralgias  HOME MEDS: Current Outpatient Prescriptions  Medication Sig Dispense Refill  . albuterol (PROVENTIL) (2.5 MG/3ML) 0.083% nebulizer solution Take 2.5 mg by nebulization as needed for wheezing.    Marland Kitchen aspirin 81 MG tablet Take  81 mg by mouth daily.    Marland Kitchen atorvastatin (LIPITOR) 40 MG tablet TAKE 1 TABLET BY MOUTH EVERY DAY 30 tablet 11  . benzonatate (TESSALON PERLES) 100 MG capsule Take 1-2 capsules (100-200 mg total) by mouth 3 (three) times daily as needed for cough. 30 capsule 0  . cetirizine (ZYRTEC) 10 MG chewable tablet Chew 10 mg by mouth daily.    . Cholecalciferol (VITAMIN D-3 PO) Take by mouth daily.    . Coenzyme Q10 (CO Q-10 PO) Take by mouth daily.    Marland Kitchen estradiol (VIVELLE-DOT) 0.1 MG/24HR patch Place 1 patch onto the skin 2 (two) times a week.    . Fluticasone-Salmeterol (ADVAIR) 100-50 MCG/DOSE AEPB Inhale 1 puff into the lungs once.    Marland Kitchen guaiFENesin-codeine 100-10 MG/5ML syrup Take 5-10 mLs by mouth every 6 (six) hours as needed for cough. 120 mL 0  . HYDROcodone-acetaminophen (NORCO/VICODIN) 5-325 MG tablet Take 1 tablet by mouth every 6 (six) hours as needed. (PAIN)    . ipratropium (ATROVENT) 0.06 % nasal spray Place 2 sprays into both nostrils 4 (four) times daily. 15 mL 12  . KRILL OIL PO Take by mouth daily.    Marland Kitchen lidocaine (LIDODERM) 5 % Place 1 patch onto the skin as needed. Remove & Discard patch within 12 hours or as directed by MD    . losartan (COZAAR) 50 MG tablet Take 50 mg by mouth daily.    . methocarbamol (ROBAXIN) 500 MG tablet Take 1 tablet by mouth 2 (two) times daily as needed.    . mometasone (NASONEX) 50 MCG/ACT nasal spray Place 2 sprays into the nose daily.    . montelukast (SINGULAIR) 10 MG tablet Take 10 mg by mouth at bedtime as needed.     . Multiple Vitamin (MULTIVITAMIN) capsule Take 1 capsule by mouth daily.    . pantoprazole (PROTONIX) 40 MG tablet Take 40 mg by mouth daily as needed.     . RESTASIS 0.05 % ophthalmic emulsion Place 1 drop into both eyes 2 (two) times daily.    . traMADol (ULTRAM) 50 MG tablet Take 50 mg by mouth every 6 (six) hours as needed for pain.    Marland Kitchen zolpidem (AMBIEN) 10 MG tablet Take 1 tablet by mouth at bedtime as needed.     No current  facility-administered medications for this visit.    LABS/IMAGING: No results found for this or any previous visit (from the past 48 hour(s)). No results found.  VITALS: BP 134/90 mmHg  Pulse 88  Ht 5\' 5"  (1.651 m)  Wt 204 lb 11.2 oz (92.851 kg)  BMI 34.06 kg/m2  EXAM: Deferred  EKG: Deferred  ASSESSMENT: 1. Lipidemia - no pitting edema on exam, negative dopplers for venous reflux last year 2. Hypertension 3. Dyslipidemia 4. Rheumatoid arthritis  PLAN: 1.   Kathy Duran has normal LV function and mild diastolic dysfunction on echo. No clear explanation for her lower extremity edema. She's  benefited from compression stockings. I recommend she continue to use those and watch salt in her diet. She should elevate her feet when she is not standing or sitting. Plan to see her back annually or sooner as necessary.  Chrystie Nose, MD, [facility] Attending Cardiologist [facility]  Lisette Abu Ceola Para 06/22/2015, 1:08 PM

## 2015-06-24 DIAGNOSIS — M2560 Stiffness of unspecified joint, not elsewhere classified: Secondary | ICD-10-CM | POA: Diagnosis not present

## 2015-06-24 DIAGNOSIS — M6281 Muscle weakness (generalized): Secondary | ICD-10-CM | POA: Diagnosis not present

## 2015-06-24 DIAGNOSIS — R293 Abnormal posture: Secondary | ICD-10-CM | POA: Diagnosis not present

## 2015-06-24 DIAGNOSIS — M542 Cervicalgia: Secondary | ICD-10-CM | POA: Diagnosis not present

## 2015-06-27 DIAGNOSIS — M542 Cervicalgia: Secondary | ICD-10-CM | POA: Diagnosis not present

## 2015-06-27 DIAGNOSIS — M6281 Muscle weakness (generalized): Secondary | ICD-10-CM | POA: Diagnosis not present

## 2015-06-27 DIAGNOSIS — R293 Abnormal posture: Secondary | ICD-10-CM | POA: Diagnosis not present

## 2015-06-27 DIAGNOSIS — M2560 Stiffness of unspecified joint, not elsewhere classified: Secondary | ICD-10-CM | POA: Diagnosis not present

## 2015-06-30 DIAGNOSIS — M2560 Stiffness of unspecified joint, not elsewhere classified: Secondary | ICD-10-CM | POA: Diagnosis not present

## 2015-06-30 DIAGNOSIS — M6281 Muscle weakness (generalized): Secondary | ICD-10-CM | POA: Diagnosis not present

## 2015-06-30 DIAGNOSIS — M542 Cervicalgia: Secondary | ICD-10-CM | POA: Diagnosis not present

## 2015-06-30 DIAGNOSIS — R293 Abnormal posture: Secondary | ICD-10-CM | POA: Diagnosis not present

## 2015-07-11 DIAGNOSIS — M2560 Stiffness of unspecified joint, not elsewhere classified: Secondary | ICD-10-CM | POA: Diagnosis not present

## 2015-07-11 DIAGNOSIS — M6281 Muscle weakness (generalized): Secondary | ICD-10-CM | POA: Diagnosis not present

## 2015-07-11 DIAGNOSIS — M542 Cervicalgia: Secondary | ICD-10-CM | POA: Diagnosis not present

## 2015-07-11 DIAGNOSIS — R293 Abnormal posture: Secondary | ICD-10-CM | POA: Diagnosis not present

## 2015-07-12 DIAGNOSIS — M6281 Muscle weakness (generalized): Secondary | ICD-10-CM | POA: Diagnosis not present

## 2015-07-12 DIAGNOSIS — M542 Cervicalgia: Secondary | ICD-10-CM | POA: Diagnosis not present

## 2015-07-12 DIAGNOSIS — R293 Abnormal posture: Secondary | ICD-10-CM | POA: Diagnosis not present

## 2015-07-12 DIAGNOSIS — M2560 Stiffness of unspecified joint, not elsewhere classified: Secondary | ICD-10-CM | POA: Diagnosis not present

## 2015-07-14 ENCOUNTER — Other Ambulatory Visit: Payer: Self-pay | Admitting: Internal Medicine

## 2015-07-14 MED ORDER — ATORVASTATIN CALCIUM 40 MG PO TABS: 40.0000 mg | ORAL_TABLET | Freq: Every day | ORAL | Status: DC

## 2015-07-19 DIAGNOSIS — R293 Abnormal posture: Secondary | ICD-10-CM | POA: Diagnosis not present

## 2015-07-19 DIAGNOSIS — M542 Cervicalgia: Secondary | ICD-10-CM | POA: Diagnosis not present

## 2015-07-19 DIAGNOSIS — M2560 Stiffness of unspecified joint, not elsewhere classified: Secondary | ICD-10-CM | POA: Diagnosis not present

## 2015-07-19 DIAGNOSIS — M6281 Muscle weakness (generalized): Secondary | ICD-10-CM | POA: Diagnosis not present

## 2015-08-09 DIAGNOSIS — K219 Gastro-esophageal reflux disease without esophagitis: Secondary | ICD-10-CM | POA: Diagnosis not present

## 2015-08-09 DIAGNOSIS — Z0000 Encounter for general adult medical examination without abnormal findings: Secondary | ICD-10-CM | POA: Diagnosis not present

## 2015-08-09 DIAGNOSIS — E782 Mixed hyperlipidemia: Secondary | ICD-10-CM | POA: Diagnosis not present

## 2015-08-09 DIAGNOSIS — J309 Allergic rhinitis, unspecified: Secondary | ICD-10-CM | POA: Diagnosis not present

## 2015-08-09 DIAGNOSIS — Z780 Asymptomatic menopausal state: Secondary | ICD-10-CM | POA: Diagnosis not present

## 2015-08-09 DIAGNOSIS — I10 Essential (primary) hypertension: Secondary | ICD-10-CM | POA: Diagnosis not present

## 2015-08-09 DIAGNOSIS — M3500 Sicca syndrome, unspecified: Secondary | ICD-10-CM | POA: Diagnosis not present

## 2015-08-09 DIAGNOSIS — J45909 Unspecified asthma, uncomplicated: Secondary | ICD-10-CM | POA: Diagnosis not present

## 2015-08-09 DIAGNOSIS — M5090 Cervical disc disorder, unspecified, unspecified cervical region: Secondary | ICD-10-CM | POA: Diagnosis not present

## 2015-08-09 DIAGNOSIS — Z23 Encounter for immunization: Secondary | ICD-10-CM | POA: Diagnosis not present

## 2015-09-15 DIAGNOSIS — Z780 Asymptomatic menopausal state: Secondary | ICD-10-CM | POA: Diagnosis not present

## 2015-10-10 DIAGNOSIS — K219 Gastro-esophageal reflux disease without esophagitis: Secondary | ICD-10-CM | POA: Diagnosis not present

## 2015-10-10 DIAGNOSIS — E785 Hyperlipidemia, unspecified: Secondary | ICD-10-CM | POA: Diagnosis not present

## 2015-10-10 DIAGNOSIS — J45909 Unspecified asthma, uncomplicated: Secondary | ICD-10-CM | POA: Diagnosis not present

## 2015-10-10 DIAGNOSIS — Z6834 Body mass index (BMI) 34.0-34.9, adult: Secondary | ICD-10-CM | POA: Diagnosis not present

## 2015-10-10 DIAGNOSIS — M47812 Spondylosis without myelopathy or radiculopathy, cervical region: Secondary | ICD-10-CM | POA: Diagnosis not present

## 2015-10-10 DIAGNOSIS — M158 Other polyosteoarthritis: Secondary | ICD-10-CM | POA: Diagnosis not present

## 2015-10-10 DIAGNOSIS — I10 Essential (primary) hypertension: Secondary | ICD-10-CM | POA: Diagnosis not present

## 2015-10-10 DIAGNOSIS — G4700 Insomnia, unspecified: Secondary | ICD-10-CM | POA: Diagnosis not present

## 2015-10-18 DIAGNOSIS — M1612 Unilateral primary osteoarthritis, left hip: Secondary | ICD-10-CM | POA: Diagnosis not present

## 2015-10-18 DIAGNOSIS — M25552 Pain in left hip: Secondary | ICD-10-CM | POA: Diagnosis not present

## 2015-10-25 DIAGNOSIS — M1612 Unilateral primary osteoarthritis, left hip: Secondary | ICD-10-CM | POA: Diagnosis not present

## 2015-11-07 DIAGNOSIS — J45909 Unspecified asthma, uncomplicated: Secondary | ICD-10-CM | POA: Diagnosis not present

## 2015-11-07 DIAGNOSIS — J309 Allergic rhinitis, unspecified: Secondary | ICD-10-CM | POA: Diagnosis not present

## 2015-11-18 DIAGNOSIS — M1610 Unilateral primary osteoarthritis, unspecified hip: Secondary | ICD-10-CM | POA: Diagnosis not present

## 2015-11-18 DIAGNOSIS — I10 Essential (primary) hypertension: Secondary | ICD-10-CM | POA: Diagnosis not present

## 2015-11-30 DIAGNOSIS — M1612 Unilateral primary osteoarthritis, left hip: Secondary | ICD-10-CM | POA: Diagnosis not present

## 2015-11-30 DIAGNOSIS — M25552 Pain in left hip: Secondary | ICD-10-CM | POA: Diagnosis not present

## 2015-12-07 DIAGNOSIS — Z6834 Body mass index (BMI) 34.0-34.9, adult: Secondary | ICD-10-CM | POA: Diagnosis not present

## 2015-12-07 DIAGNOSIS — E669 Obesity, unspecified: Secondary | ICD-10-CM | POA: Diagnosis not present

## 2015-12-07 DIAGNOSIS — I10 Essential (primary) hypertension: Secondary | ICD-10-CM | POA: Diagnosis not present

## 2015-12-27 DIAGNOSIS — M25552 Pain in left hip: Secondary | ICD-10-CM | POA: Diagnosis not present

## 2015-12-27 DIAGNOSIS — G4709 Other insomnia: Secondary | ICD-10-CM | POA: Diagnosis not present

## 2015-12-27 DIAGNOSIS — M797 Fibromyalgia: Secondary | ICD-10-CM | POA: Diagnosis not present

## 2015-12-27 DIAGNOSIS — M3509 Sicca syndrome with other organ involvement: Secondary | ICD-10-CM | POA: Diagnosis not present

## 2016-01-02 DIAGNOSIS — M25562 Pain in left knee: Secondary | ICD-10-CM | POA: Diagnosis not present

## 2016-01-11 ENCOUNTER — Other Ambulatory Visit: Payer: Self-pay | Admitting: Internal Medicine

## 2016-01-11 DIAGNOSIS — Z1231 Encounter for screening mammogram for malignant neoplasm of breast: Secondary | ICD-10-CM

## 2016-01-25 DIAGNOSIS — M7072 Other bursitis of hip, left hip: Secondary | ICD-10-CM | POA: Diagnosis not present

## 2016-01-31 ENCOUNTER — Ambulatory Visit: Payer: Medicare PPO

## 2016-02-07 DIAGNOSIS — I10 Essential (primary) hypertension: Secondary | ICD-10-CM | POA: Diagnosis not present

## 2016-02-07 DIAGNOSIS — E782 Mixed hyperlipidemia: Secondary | ICD-10-CM | POA: Diagnosis not present

## 2016-02-07 DIAGNOSIS — Z01818 Encounter for other preprocedural examination: Secondary | ICD-10-CM | POA: Diagnosis not present

## 2016-02-07 DIAGNOSIS — E559 Vitamin D deficiency, unspecified: Secondary | ICD-10-CM | POA: Diagnosis not present

## 2016-02-07 DIAGNOSIS — J45909 Unspecified asthma, uncomplicated: Secondary | ICD-10-CM | POA: Diagnosis not present

## 2016-02-07 DIAGNOSIS — M169 Osteoarthritis of hip, unspecified: Secondary | ICD-10-CM | POA: Diagnosis not present

## 2016-02-09 ENCOUNTER — Other Ambulatory Visit: Payer: Self-pay | Admitting: Physician Assistant

## 2016-02-15 ENCOUNTER — Ambulatory Visit
Admission: RE | Admit: 2016-02-15 | Discharge: 2016-02-15 | Disposition: A | Discharge status: Home / Self Care | Payer: Medicare PPO | Source: Ambulatory Visit | Attending: Internal Medicine | Admitting: Internal Medicine

## 2016-02-15 DIAGNOSIS — Z1231 Encounter for screening mammogram for malignant neoplasm of breast: Secondary | ICD-10-CM

## 2016-02-17 ENCOUNTER — Encounter (HOSPITAL_COMMUNITY)
Admission: RE | Admit: 2016-02-17 | Discharge: 2016-02-17 | Disposition: A | Discharge status: Home / Self Care | Payer: Medicare PPO | Source: Ambulatory Visit | Attending: Orthopaedic Surgery | Admitting: Orthopaedic Surgery

## 2016-02-17 ENCOUNTER — Encounter (HOSPITAL_COMMUNITY): Payer: Self-pay

## 2016-02-17 DIAGNOSIS — M1612 Unilateral primary osteoarthritis, left hip: Secondary | ICD-10-CM | POA: Diagnosis not present

## 2016-02-17 DIAGNOSIS — Z0183 Encounter for blood typing: Secondary | ICD-10-CM | POA: Diagnosis not present

## 2016-02-17 DIAGNOSIS — Z01812 Encounter for preprocedural laboratory examination: Secondary | ICD-10-CM | POA: Diagnosis not present

## 2016-02-17 HISTORY — DX: Sjogren syndrome, unspecified: M35.00

## 2016-02-17 HISTORY — DX: Unspecified osteoarthritis, unspecified site: M19.90

## 2016-02-17 HISTORY — DX: Unspecified urinary incontinence: R32

## 2016-02-17 LAB — BASIC METABOLIC PANEL
Anion gap: 8 (ref 5–15)
BUN: 12 mg/dL (ref 6–20)
CO2: 25 mmol/L (ref 22–32)
Calcium: 9.2 mg/dL (ref 8.9–10.3)
Chloride: 103 mmol/L (ref 101–111)
Creatinine, Ser: 0.68 mg/dL (ref 0.44–1.00)
GFR calc Af Amer: >60 mL/min (ref >60)
GFR calc non Af Amer: >60 mL/min (ref >60)
Glucose, Bld: 80 mg/dL (ref 65–99)
Potassium: 4.1 mmol/L (ref 3.5–5.1)
Sodium: 136 mmol/L (ref 135–145)

## 2016-02-17 LAB — CBC
HCT: 40.9 % (ref 36.0–46.0)
Hemoglobin: 13.6 g/dL (ref 12.0–15.0)
MCH: 29.5 pg (ref 26.0–34.0)
MCHC: 33.3 g/dL (ref 30.0–36.0)
MCV: 88.7 fL (ref 78.0–100.0)
Platelets: 303 10*3/uL (ref 150–400)
RBC: 4.61 MIL/uL (ref 3.87–5.11)
RDW: 13.6 % (ref 11.5–15.5)
WBC: 4.1 10*3/uL (ref 4.0–10.5)

## 2016-02-17 LAB — ABO/RH: ABO/RH(D): O POS

## 2016-02-17 LAB — SURGICAL PCR SCREEN
MRSA, PCR: NEGATIVE
Staphylococcus aureus: NEGATIVE

## 2016-02-17 NOTE — Patient Instructions (Addendum)
Kathy Duran  02/17/2016    Your procedure is scheduled on: 02-24-16   Report to [facility] Main  Entrance take [facility]  elevators to 3rd floor to  Short Stay Center at   0700 AM.  Call this number if you have problems the morning of surgery [phone]   Remember: ONLY 1 PERSON MAY GO WITH YOU TO SHORT STAY TO GET  READY MORNING OF YOUR SURGERY.  Do not eat food or drink liquids :After Midnight.     Take these medicines the morning of surgery with A SIP OF WATER: Tramadol/Cetrizine-if need. Use Inhalers-usual. Eye drops usual. No Nasal Spray use if using antibiotic nasal ointment. DO NOT TAKE ANY DIABETIC MEDICATIONS DAY OF YOUR SURGERY                               You may not have any metal on your body including hair pins and              piercings  Do not wear jewelry, make-up, lotions, powders or perfumes, deodorant             Do not wear nail polish.  Do not shave  48 hours prior to surgery.              Men may shave face and neck.   Do not bring valuables to the hospital. [facility] IS NOT             RESPONSIBLE   FOR VALUABLES.  Contacts, dentures or bridgework may not be worn into surgery.  Leave suitcase in the car. After surgery it may be brought to your room.     Patients discharged the day of surgery will not be allowed to drive home.  Name and phone number of your driver: Sheliah Plane [phone] cell  Special Instructions: N/A              Please read over the following fact sheets you were given: _____________________________________________________________________             [facility] - Preparing for Surgery Before surgery, you can play an important role.  Because skin is not sterile, your skin needs to be as free of germs as possible.  You can reduce the number of germs on your skin by washing with CHG (chlorahexidine gluconate) soap before surgery.  CHG is an antiseptic cleaner which kills germs and bonds with the skin to  continue killing germs even after washing. Please DO NOT use if you have an allergy to CHG or antibacterial soaps.  If your skin becomes reddened/irritated stop using the CHG and inform your nurse when you arrive at Short Stay. Do not shave (including legs and underarms) for at least 48 hours prior to the first CHG shower.  You may shave your face/neck. Please follow these instructions carefully:  1.  Shower with CHG Soap the night before surgery and the  morning of Surgery.  2.  If you choose to wash your hair, wash your hair first as usual with your  normal  shampoo.  3.  After you shampoo, rinse your hair and body thoroughly to remove the  shampoo.                           4.  Use  CHG as you would any other liquid soap.  You can apply chg directly  to the skin and wash                       Gently with a scrungie or clean washcloth.  5.  Apply the CHG Soap to your body ONLY FROM THE NECK DOWN.   Do not use on face/ open                           Wound or open sores. Avoid contact with eyes, ears mouth and genitals (private parts).                       Wash face,  Genitals (private parts) with your normal soap.             6.  Wash thoroughly, paying special attention to the area where your surgery  will be performed.  7.  Thoroughly rinse your body with warm water from the neck down.  8.  DO NOT shower/wash with your normal soap after using and rinsing off  the CHG Soap.                9.  Pat yourself dry with a clean towel.            10.  Wear clean pajamas.            11.  Place clean sheets on your bed the night of your first shower and do not  sleep with pets. Day of Surgery : Do not apply any lotions/deodorants the morning of surgery.  Please wear clean clothes to the hospital/surgery center.  FAILURE TO FOLLOW THESE INSTRUCTIONS MAY RESULT IN THE CANCELLATION OF YOUR SURGERY PATIENT SIGNATURE_________________________________  NURSE  SIGNATURE__________________________________  ________________________________________________________________________   Kathy Duran  An incentive spirometer is a tool that can help keep your lungs clear and active. This tool measures how well you are filling your lungs with each breath. Taking long deep breaths may help reverse or decrease the chance of developing breathing (pulmonary) problems (especially infection) following:  A long period of time when you are unable to move or be active. BEFORE THE PROCEDURE   If the spirometer includes an indicator to show your best effort, your nurse or respiratory therapist will set it to a desired goal.  If possible, sit up straight or lean slightly forward. Try not to slouch.  Hold the incentive spirometer in an upright position. INSTRUCTIONS FOR USE  1. Sit on the edge of your bed if possible, or sit up as far as you can in bed or on a chair. 2. Hold the incentive spirometer in an upright position. 3. Breathe out normally. 4. Place the mouthpiece in your mouth and seal your lips tightly around it. 5. Breathe in slowly and as deeply as possible, raising the piston or the ball toward the top of the column. 6. Hold your breath for 3-5 seconds or for as long as possible. Allow the piston or ball to fall to the bottom of the column. 7. Remove the mouthpiece from your mouth and breathe out normally. 8. Rest for a few seconds and repeat Steps 1 through 7 at least 10 times every 1-2 hours when you are awake. Take your time and take a few normal breaths between deep breaths. 9. The spirometer may include an indicator to show your best effort.  Use the indicator as a goal to work toward during each repetition. 10. After each set of 10 deep breaths, practice coughing to be sure your lungs are clear. If you have an incision (the cut made at the time of surgery), support your incision when coughing by placing a pillow or rolled up towels firmly  against it. Once you are able to get out of bed, walk around indoors and cough well. You may stop using the incentive spirometer when instructed by your caregiver.  RISKS AND COMPLICATIONS  Take your time so you do not get dizzy or light-headed.  If you are in pain, you may need to take or ask for pain medication before doing incentive spirometry. It is harder to take a deep breath if you are having pain. AFTER USE  Rest and breathe slowly and easily.  It can be helpful to keep track of a log of your progress. Your caregiver can provide you with a simple table to help with this. If you are using the spirometer at home, follow these instructions: [place] IF:   You are having difficultly using the spirometer.  You have trouble using the spirometer as often as instructed.  Your pain medication is not giving enough relief while using the spirometer.  You develop fever of 100.5 F (38.1 C) or higher. SEEK IMMEDIATE MEDICAL CARE IF:   You cough up bloody sputum that had not been present before.  You develop fever of 102 F (38.9 C) or greater.  You develop worsening pain at or near the incision site. MAKE SURE YOU:   Understand these instructions.  Will watch your condition.  Will get help right away if you are not doing well or get worse. Document Released: 11/19/2006 Document Revised: 10/01/2011 Document Reviewed: 01/20/2007 [facility] Patient Information 2014 [place], [place].   ________________________________________________________________________

## 2016-02-17 NOTE — Pre-Procedure Instructions (Signed)
EKG 11'16, Echo 10'16 Epic.

## 2016-02-24 ENCOUNTER — Inpatient Hospital Stay (HOSPITAL_COMMUNITY): Payer: Medicare PPO

## 2016-02-24 ENCOUNTER — Inpatient Hospital Stay (HOSPITAL_COMMUNITY)
Admission: RE | Admit: 2016-02-24 | Discharge: 2016-02-27 | DRG: 470 | Disposition: A | Discharge status: Home Health | Payer: Medicare PPO | Source: Ambulatory Visit | Attending: Orthopaedic Surgery | Admitting: Orthopaedic Surgery

## 2016-02-24 ENCOUNTER — Inpatient Hospital Stay (HOSPITAL_COMMUNITY): Payer: Medicare PPO | Admitting: Certified Registered Nurse Anesthetist

## 2016-02-24 ENCOUNTER — Encounter (HOSPITAL_COMMUNITY): Payer: Self-pay

## 2016-02-24 ENCOUNTER — Encounter (HOSPITAL_COMMUNITY)
Admission: RE | Disposition: A | Discharge status: Home Health | Payer: Self-pay | Source: Ambulatory Visit | Attending: Orthopaedic Surgery

## 2016-02-24 DIAGNOSIS — Z419 Encounter for procedure for purposes other than remedying health state, unspecified: Secondary | ICD-10-CM

## 2016-02-24 DIAGNOSIS — F419 Anxiety disorder, unspecified: Secondary | ICD-10-CM | POA: Diagnosis present

## 2016-02-24 DIAGNOSIS — Z7951 Long term (current) use of inhaled steroids: Secondary | ICD-10-CM

## 2016-02-24 DIAGNOSIS — M25552 Pain in left hip: Secondary | ICD-10-CM | POA: Diagnosis present

## 2016-02-24 DIAGNOSIS — M3500 Sicca syndrome, unspecified: Secondary | ICD-10-CM | POA: Diagnosis not present

## 2016-02-24 DIAGNOSIS — K219 Gastro-esophageal reflux disease without esophagitis: Secondary | ICD-10-CM | POA: Diagnosis present

## 2016-02-24 DIAGNOSIS — Z7982 Long term (current) use of aspirin: Secondary | ICD-10-CM | POA: Diagnosis not present

## 2016-02-24 DIAGNOSIS — Z96641 Presence of right artificial hip joint: Secondary | ICD-10-CM | POA: Diagnosis present

## 2016-02-24 DIAGNOSIS — Z471 Aftercare following joint replacement surgery: Secondary | ICD-10-CM | POA: Diagnosis not present

## 2016-02-24 DIAGNOSIS — E785 Hyperlipidemia, unspecified: Secondary | ICD-10-CM | POA: Diagnosis not present

## 2016-02-24 DIAGNOSIS — J45909 Unspecified asthma, uncomplicated: Secondary | ICD-10-CM | POA: Diagnosis present

## 2016-02-24 DIAGNOSIS — I10 Essential (primary) hypertension: Secondary | ICD-10-CM | POA: Diagnosis not present

## 2016-02-24 DIAGNOSIS — M1612 Unilateral primary osteoarthritis, left hip: Secondary | ICD-10-CM

## 2016-02-24 DIAGNOSIS — R269 Unspecified abnormalities of gait and mobility: Secondary | ICD-10-CM | POA: Diagnosis not present

## 2016-02-24 DIAGNOSIS — Z96642 Presence of left artificial hip joint: Secondary | ICD-10-CM

## 2016-02-24 DIAGNOSIS — M169 Osteoarthritis of hip, unspecified: Secondary | ICD-10-CM | POA: Diagnosis not present

## 2016-02-24 DIAGNOSIS — J4520 Mild intermittent asthma, uncomplicated: Secondary | ICD-10-CM | POA: Diagnosis not present

## 2016-02-24 DIAGNOSIS — Z87891 Personal history of nicotine dependence: Secondary | ICD-10-CM | POA: Diagnosis not present

## 2016-02-24 DIAGNOSIS — Z96643 Presence of artificial hip joint, bilateral: Secondary | ICD-10-CM

## 2016-02-24 HISTORY — PX: TOTAL HIP ARTHROPLASTY: SHX124

## 2016-02-24 LAB — TYPE AND SCREEN
ABO/RH(D): O POS
Antibody Screen: NEGATIVE

## 2016-02-24 SURGERY — ARTHROPLASTY, HIP, TOTAL, ANTERIOR APPROACH
Anesthesia: General | Site: Hip | Laterality: Left

## 2016-02-24 MED ORDER — SODIUM CHLORIDE 0.9 % IR SOLN
Status: DC | PRN
  Administered 2016-02-24: 1000 mL

## 2016-02-24 MED ORDER — CYCLOSPORINE 0.05 % OP EMUL
1.0000 [drp] | Freq: Two times a day (BID) | OPHTHALMIC | Status: DC
  Administered 2016-02-24 – 2016-02-27 (×6): 1 [drp] via OPHTHALMIC
  Filled 2016-02-24 (×8): qty 1

## 2016-02-24 MED ORDER — DEXTROSE 5 % IV SOLN
500.0000 mg | Freq: Four times a day (QID) | INTRAVENOUS | Status: DC | PRN
  Administered 2016-02-24: 500 mg via INTRAVENOUS
  Filled 2016-02-24: qty 550
  Filled 2016-02-24: qty 5

## 2016-02-24 MED ORDER — HYDROMORPHONE HCL 1 MG/ML IJ SOLN
1.0000 mg | INTRAMUSCULAR | Status: DC | PRN
Start: 2016-02-24 — End: 2016-02-27
  Administered 2016-02-24 (×3): 1 mg via INTRAVENOUS
  Filled 2016-02-24 (×4): qty 1

## 2016-02-24 MED ORDER — MIDAZOLAM HCL 2 MG/2ML IJ SOLN
INTRAMUSCULAR | Status: AC
  Filled 2016-02-24: qty 2

## 2016-02-24 MED ORDER — ACETAMINOPHEN 650 MG RE SUPP: 650.0000 mg | Freq: Four times a day (QID) | RECTAL | Status: DC | PRN

## 2016-02-24 MED ORDER — TRANEXAMIC ACID 1000 MG/10ML IV SOLN
1000.0000 mg | INTRAVENOUS | Status: AC
  Administered 2016-02-24: 1000 mg via INTRAVENOUS
  Filled 2016-02-24: qty 10

## 2016-02-24 MED ORDER — SUGAMMADEX SODIUM 200 MG/2ML IV SOLN
INTRAVENOUS | Status: AC
  Filled 2016-02-24: qty 2

## 2016-02-24 MED ORDER — CHLORHEXIDINE GLUCONATE 4 % EX LIQD: 60.0000 mL | Freq: Once | CUTANEOUS | Status: DC

## 2016-02-24 MED ORDER — MIDAZOLAM HCL 5 MG/5ML IJ SOLN
INTRAMUSCULAR | Status: DC | PRN
  Administered 2016-02-24 (×2): 1 mg via INTRAVENOUS

## 2016-02-24 MED ORDER — DEXAMETHASONE SODIUM PHOSPHATE 10 MG/ML IJ SOLN
INTRAMUSCULAR | Status: DC | PRN
  Administered 2016-02-24: 10 mg via INTRAVENOUS

## 2016-02-24 MED ORDER — ALBUTEROL SULFATE (2.5 MG/3ML) 0.083% IN NEBU: 2.5000 mg | INHALATION_SOLUTION | RESPIRATORY_TRACT | Status: DC | PRN

## 2016-02-24 MED ORDER — ONDANSETRON HCL 4 MG/2ML IJ SOLN
INTRAMUSCULAR | Status: AC
  Filled 2016-02-24: qty 2

## 2016-02-24 MED ORDER — MULTIVITAMINS PO CAPS: 1.0000 | ORAL_CAPSULE | Freq: Every day | ORAL | Status: DC

## 2016-02-24 MED ORDER — PROPOFOL 10 MG/ML IV BOLUS
INTRAVENOUS | Status: DC | PRN
  Administered 2016-02-24: 150 mg via INTRAVENOUS

## 2016-02-24 MED ORDER — FENTANYL CITRATE (PF) 100 MCG/2ML IJ SOLN
INTRAMUSCULAR | Status: DC | PRN
  Administered 2016-02-24: 100 ug via INTRAVENOUS
  Administered 2016-02-24 (×3): 50 ug via INTRAVENOUS

## 2016-02-24 MED ORDER — LACTATED RINGERS IV SOLN
INTRAVENOUS | Status: DC | PRN
  Administered 2016-02-24 (×2): via INTRAVENOUS

## 2016-02-24 MED ORDER — PROPOFOL 10 MG/ML IV BOLUS
INTRAVENOUS | Status: AC
  Filled 2016-02-24: qty 40

## 2016-02-24 MED ORDER — PANTOPRAZOLE SODIUM 40 MG PO TBEC: 40.0000 mg | DELAYED_RELEASE_TABLET | Freq: Every day | ORAL | Status: DC | PRN

## 2016-02-24 MED ORDER — MOMETASONE FURO-FORMOTEROL FUM 100-5 MCG/ACT IN AERO
2.0000 | INHALATION_SPRAY | Freq: Two times a day (BID) | RESPIRATORY_TRACT | Status: DC
  Administered 2016-02-24 – 2016-02-27 (×6): 2 via RESPIRATORY_TRACT
  Filled 2016-02-24: qty 8.8

## 2016-02-24 MED ORDER — CEFAZOLIN IN D5W 1 GM/50ML IV SOLN
1.0000 g | Freq: Four times a day (QID) | INTRAVENOUS | Status: AC
  Administered 2016-02-24 (×2): 1 g via INTRAVENOUS
  Filled 2016-02-24 (×2): qty 50

## 2016-02-24 MED ORDER — ADULT MULTIVITAMIN W/MINERALS CH
1.0000 | ORAL_TABLET | Freq: Every day | ORAL | Status: DC
  Administered 2016-02-25 – 2016-02-27 (×3): 1 via ORAL
  Filled 2016-02-24 (×3): qty 1

## 2016-02-24 MED ORDER — METOCLOPRAMIDE HCL 5 MG/ML IJ SOLN: 10.0000 mg | Freq: Once | INTRAMUSCULAR | Status: DC | PRN

## 2016-02-24 MED ORDER — ONDANSETRON HCL 4 MG/2ML IJ SOLN
INTRAMUSCULAR | Status: DC | PRN
  Administered 2016-02-24: 4 mg via INTRAVENOUS

## 2016-02-24 MED ORDER — MEPERIDINE HCL 50 MG/ML IJ SOLN: 6.2500 mg | INTRAMUSCULAR | Status: DC | PRN

## 2016-02-24 MED ORDER — SUGAMMADEX SODIUM 200 MG/2ML IV SOLN
INTRAVENOUS | Status: DC | PRN
  Administered 2016-02-24: 200 mg via INTRAVENOUS

## 2016-02-24 MED ORDER — FENTANYL CITRATE (PF) 100 MCG/2ML IJ SOLN
50.0000 ug | INTRAMUSCULAR | Status: AC | PRN
  Administered 2016-02-24 (×2): 50 ug via INTRAVENOUS

## 2016-02-24 MED ORDER — MONTELUKAST SODIUM 10 MG PO TABS: 10.0000 mg | ORAL_TABLET | Freq: Every evening | ORAL | Status: DC | PRN

## 2016-02-24 MED ORDER — EPHEDRINE SULFATE 50 MG/ML IJ SOLN
INTRAMUSCULAR | Status: DC | PRN
Start: 2016-02-24 — End: 2016-02-24
  Administered 2016-02-24 (×5): 10 mg via INTRAVENOUS

## 2016-02-24 MED ORDER — HYDROMORPHONE HCL 1 MG/ML IJ SOLN
INTRAMUSCULAR | Status: DC | PRN
  Administered 2016-02-24: 0.5 mg via INTRAVENOUS
  Administered 2016-02-24: 1 mg via INTRAVENOUS
  Administered 2016-02-24: 0.5 mg via INTRAVENOUS

## 2016-02-24 MED ORDER — EPHEDRINE SULFATE 50 MG/ML IJ SOLN
INTRAMUSCULAR | Status: AC
  Filled 2016-02-24: qty 1

## 2016-02-24 MED ORDER — LOSARTAN POTASSIUM 50 MG PO TABS
50.0000 mg | ORAL_TABLET | Freq: Every day | ORAL | Status: DC
  Administered 2016-02-25 – 2016-02-27 (×3): 50 mg via ORAL
  Filled 2016-02-24 (×3): qty 1

## 2016-02-24 MED ORDER — ROCURONIUM BROMIDE 100 MG/10ML IV SOLN
INTRAVENOUS | Status: AC
  Filled 2016-02-24: qty 1

## 2016-02-24 MED ORDER — FENTANYL CITRATE (PF) 100 MCG/2ML IJ SOLN
INTRAMUSCULAR | Status: AC
  Administered 2016-02-24: 50 ug via INTRAVENOUS
  Filled 2016-02-24: qty 2

## 2016-02-24 MED ORDER — METOCLOPRAMIDE HCL 5 MG PO TABS: 5.0000 mg | ORAL_TABLET | Freq: Three times a day (TID) | ORAL | Status: DC | PRN

## 2016-02-24 MED ORDER — FENTANYL CITRATE (PF) 100 MCG/2ML IJ SOLN
25.0000 ug | INTRAMUSCULAR | Status: DC | PRN
  Administered 2016-02-24 (×2): 50 ug via INTRAVENOUS

## 2016-02-24 MED ORDER — SODIUM CHLORIDE 0.9 % IV SOLN
INTRAVENOUS | Status: DC
  Administered 2016-02-24 – 2016-02-25 (×2): via INTRAVENOUS

## 2016-02-24 MED ORDER — DIPHENHYDRAMINE HCL 12.5 MG/5ML PO ELIX: 12.5000 mg | ORAL_SOLUTION | ORAL | Status: DC | PRN

## 2016-02-24 MED ORDER — LIDOCAINE HCL (CARDIAC) 20 MG/ML IV SOLN
INTRAVENOUS | Status: DC | PRN
  Administered 2016-02-24: 100 mg via INTRAVENOUS

## 2016-02-24 MED ORDER — DOCUSATE SODIUM 100 MG PO CAPS
100.0000 mg | ORAL_CAPSULE | Freq: Two times a day (BID) | ORAL | Status: DC
  Administered 2016-02-24 – 2016-02-27 (×6): 100 mg via ORAL
  Filled 2016-02-24 (×6): qty 1

## 2016-02-24 MED ORDER — MENTHOL 3 MG MT LOZG: 1.0000 | LOZENGE | OROMUCOSAL | Status: DC | PRN

## 2016-02-24 MED ORDER — STERILE WATER FOR IRRIGATION IR SOLN
Status: DC | PRN
  Administered 2016-02-24: 3000 mL

## 2016-02-24 MED ORDER — ONDANSETRON HCL 4 MG PO TABS: 4.0000 mg | ORAL_TABLET | Freq: Four times a day (QID) | ORAL | Status: DC | PRN

## 2016-02-24 MED ORDER — FENTANYL CITRATE (PF) 250 MCG/5ML IJ SOLN
INTRAMUSCULAR | Status: AC
  Filled 2016-02-24: qty 5

## 2016-02-24 MED ORDER — ZOLPIDEM TARTRATE 5 MG PO TABS
5.0000 mg | ORAL_TABLET | Freq: Every evening | ORAL | Status: DC | PRN
  Administered 2016-02-24 – 2016-02-25 (×2): 5 mg via ORAL
  Filled 2016-02-24 (×2): qty 1

## 2016-02-24 MED ORDER — ALUM & MAG HYDROXIDE-SIMETH 200-200-20 MG/5ML PO SUSP
30.0000 mL | ORAL | Status: DC | PRN
Start: 2016-02-24 — End: 2016-02-27
  Administered 2016-02-25: 30 mL via ORAL
  Filled 2016-02-24: qty 30

## 2016-02-24 MED ORDER — METOCLOPRAMIDE HCL 5 MG/ML IJ SOLN: 5.0000 mg | Freq: Three times a day (TID) | INTRAMUSCULAR | Status: DC | PRN

## 2016-02-24 MED ORDER — SUCCINYLCHOLINE CHLORIDE 20 MG/ML IJ SOLN
INTRAMUSCULAR | Status: DC | PRN
  Administered 2016-02-24: 100 mg via INTRAVENOUS

## 2016-02-24 MED ORDER — SODIUM CHLORIDE 0.9 % IJ SOLN
INTRAMUSCULAR | Status: AC
  Filled 2016-02-24: qty 10

## 2016-02-24 MED ORDER — CEFAZOLIN SODIUM-DEXTROSE 2-4 GM/100ML-% IV SOLN
2.0000 g | INTRAVENOUS | Status: AC
  Administered 2016-02-24: 2 g via INTRAVENOUS

## 2016-02-24 MED ORDER — DEXAMETHASONE SODIUM PHOSPHATE 10 MG/ML IJ SOLN
INTRAMUSCULAR | Status: AC
  Filled 2016-02-24: qty 1

## 2016-02-24 MED ORDER — ACETAMINOPHEN 325 MG PO TABS
650.0000 mg | ORAL_TABLET | Freq: Four times a day (QID) | ORAL | Status: DC | PRN
  Administered 2016-02-25 – 2016-02-26 (×3): 650 mg via ORAL
  Filled 2016-02-24 (×3): qty 2

## 2016-02-24 MED ORDER — HYDROMORPHONE HCL 2 MG/ML IJ SOLN
INTRAMUSCULAR | Status: AC
  Filled 2016-02-24: qty 1

## 2016-02-24 MED ORDER — PHENOL 1.4 % MT LIQD
1.0000 | OROMUCOSAL | Status: DC | PRN
Start: 2016-02-24 — End: 2016-02-27

## 2016-02-24 MED ORDER — CEFAZOLIN SODIUM-DEXTROSE 2-4 GM/100ML-% IV SOLN
INTRAVENOUS | Status: AC
  Filled 2016-02-24: qty 100

## 2016-02-24 MED ORDER — FENTANYL CITRATE (PF) 100 MCG/2ML IJ SOLN
INTRAMUSCULAR | Status: AC
  Filled 2016-02-24: qty 2

## 2016-02-24 MED ORDER — LACTATED RINGERS IV SOLN
INTRAVENOUS | Status: DC
  Administered 2016-02-24: 11:00:00 via INTRAVENOUS

## 2016-02-24 MED ORDER — IPRATROPIUM BROMIDE 0.06 % NA SOLN: 2.0000 | Freq: Four times a day (QID) | NASAL | Status: DC | PRN

## 2016-02-24 MED ORDER — METHOCARBAMOL 500 MG PO TABS
500.0000 mg | ORAL_TABLET | Freq: Four times a day (QID) | ORAL | Status: DC | PRN
  Administered 2016-02-24 – 2016-02-27 (×5): 500 mg via ORAL
  Filled 2016-02-24 (×5): qty 1

## 2016-02-24 MED ORDER — ROCURONIUM BROMIDE 100 MG/10ML IV SOLN
INTRAVENOUS | Status: DC | PRN
  Administered 2016-02-24: 40 mg via INTRAVENOUS

## 2016-02-24 MED ORDER — OXYCODONE HCL 5 MG PO TABS
5.0000 mg | ORAL_TABLET | ORAL | Status: DC | PRN
  Administered 2016-02-24 – 2016-02-27 (×14): 10 mg via ORAL
  Filled 2016-02-24 (×15): qty 2

## 2016-02-24 MED ORDER — 0.9 % SODIUM CHLORIDE (POUR BTL) OPTIME
TOPICAL | Status: DC | PRN
  Administered 2016-02-24: 1000 mL

## 2016-02-24 MED ORDER — ASPIRIN EC 325 MG PO TBEC
325.0000 mg | DELAYED_RELEASE_TABLET | Freq: Two times a day (BID) | ORAL | Status: DC
  Administered 2016-02-25 – 2016-02-27 (×5): 325 mg via ORAL
  Filled 2016-02-24 (×5): qty 1

## 2016-02-24 MED ORDER — ONDANSETRON HCL 4 MG/2ML IJ SOLN: 4.0000 mg | Freq: Four times a day (QID) | INTRAMUSCULAR | Status: DC | PRN

## 2016-02-24 SURGICAL SUPPLY — 42 items
APL SKNCLS STERI-STRIP NONHPOA (GAUZE/BANDAGES/DRESSINGS)
BAG SPEC THK2 15X12 ZIP CLS (MISCELLANEOUS)
BAG ZIPLOCK 12X15 (MISCELLANEOUS) IMPLANT
BENZOIN TINCTURE PRP APPL 2/3 (GAUZE/BANDAGES/DRESSINGS) IMPLANT
BLADE SAW SGTL 18X1.27X75 (BLADE) ×2 IMPLANT
BLADE SAW SGTL 18X1.27X75MM (BLADE) ×1
CAPT HIP TOTAL 2 ×3 IMPLANT
CELLS DAT CNTRL 66122 CELL SVR (MISCELLANEOUS) ×1 IMPLANT
CLOSURE WOUND 1/2 X4 (GAUZE/BANDAGES/DRESSINGS)
CLOTH BEACON ORANGE TIMEOUT ST (SAFETY) ×3 IMPLANT
DRAPE STERI IOBAN 125X83 (DRAPES) ×3 IMPLANT
DRAPE U-SHAPE 47X51 STRL (DRAPES) ×6 IMPLANT
DRSG AQUACEL AG ADV 3.5X10 (GAUZE/BANDAGES/DRESSINGS) ×3 IMPLANT
DURAPREP 26ML APPLICATOR (WOUND CARE) ×3 IMPLANT
ELECT REM PT RETURN 9FT ADLT (ELECTROSURGICAL) ×3
ELECTRODE REM PT RTRN 9FT ADLT (ELECTROSURGICAL) ×1 IMPLANT
GAUZE XEROFORM 1X8 LF (GAUZE/BANDAGES/DRESSINGS) ×3 IMPLANT
GLOVE BIO SURGEON STRL SZ7.5 (GLOVE) IMPLANT
GLOVE BIOGEL PI IND STRL 7.0 (GLOVE) ×8 IMPLANT
GLOVE BIOGEL PI IND STRL 8 (GLOVE) ×2 IMPLANT
GLOVE BIOGEL PI INDICATOR 7.0 (GLOVE) ×16
GLOVE BIOGEL PI INDICATOR 8 (GLOVE) ×4
GLOVE ECLIPSE 8.0 STRL XLNG CF (GLOVE) IMPLANT
GOWN BRE IMP PREV XXLGXLNG (GOWN DISPOSABLE) ×3 IMPLANT
GOWN STRL REUS W/TWL XL LVL3 (GOWN DISPOSABLE) ×9 IMPLANT
HANDPIECE INTERPULSE COAX TIP (DISPOSABLE) ×3
HOLDER FOLEY CATH W/STRAP (MISCELLANEOUS) ×3 IMPLANT
PACK ANTERIOR HIP CUSTOM (KITS) ×3 IMPLANT
RTRCTR WOUND ALEXIS 18CM MED (MISCELLANEOUS) ×3
SET HNDPC FAN SPRY TIP SCT (DISPOSABLE) ×1 IMPLANT
STAPLER VISISTAT 35W (STAPLE) ×3 IMPLANT
STRIP CLOSURE SKIN 1/2X4 (GAUZE/BANDAGES/DRESSINGS) IMPLANT
SUT ETHIBOND NAB CT1 #1 30IN (SUTURE) ×3 IMPLANT
SUT MNCRL AB 4-0 PS2 18 (SUTURE) IMPLANT
SUT VIC AB 0 CT1 36 (SUTURE) ×3 IMPLANT
SUT VIC AB 1 CT1 36 (SUTURE) ×3 IMPLANT
SUT VIC AB 2-0 CT1 27 (SUTURE) ×6
SUT VIC AB 2-0 CT1 TAPERPNT 27 (SUTURE) ×2 IMPLANT
TRAY FOLEY BAG SILVER LF 16FR (CATHETERS) ×3 IMPLANT
TRAY FOLEY W/METER SILVER 14FR (SET/KITS/TRAYS/PACK) IMPLANT
TRAY FOLEY W/METER SILVER 16FR (SET/KITS/TRAYS/PACK) IMPLANT
YANKAUER SUCT BULB TIP NO VENT (SUCTIONS) ×3 IMPLANT

## 2016-02-24 NOTE — Transfer of Care (Signed)
Immediate Anesthesia Transfer of Care Note  Patient: Kathy Duran  Procedure(s) Performed: Procedure(s): LEFT TOTAL HIP ARTHROPLASTY ANTERIOR APPROACH (Left)  Patient Location: PACU  Anesthesia Type:General  Level of Consciousness:  sedated, patient cooperative and responds to stimulation  Airway & Oxygen Therapy:Patient Spontanous Breathing and Patient connected to face mask oxgen  Post-op Assessment:  Report given to PACU RN and Post -op Vital signs reviewed and stable  Post vital signs:  Reviewed and stable  Last Vitals:  Vitals:   02/24/16 0659  BP: 138/75  Pulse: 93  Resp: 18  Temp: 36.5 C    Complications: No apparent anesthesia complications

## 2016-02-24 NOTE — Anesthesia Preprocedure Evaluation (Signed)
Anesthesia Evaluation  Patient identified by MRN, date of birth, ID band Patient awake    Reviewed: Allergy & Precautions, NPO status , Patient's Chart, lab work & pertinent test results  Airway Mallampati: II  TM Distance: >3 FB Neck ROM: Full    Dental no notable dental hx. (+) Caps   Pulmonary asthma , former smoker,    Pulmonary exam normal breath sounds clear to auscultation       Cardiovascular hypertension, Pt. on medications Normal cardiovascular exam Rhythm:Regular Rate:Normal     Neuro/Psych negative neurological ROS  negative psych ROS   GI/Hepatic negative GI ROS, Neg liver ROS,   Endo/Other  negative endocrine ROS  Renal/GU negative Renal ROS  negative genitourinary   Musculoskeletal negative musculoskeletal ROS (+)   Abdominal   Peds negative pediatric ROS (+)  Hematology negative hematology ROS (+)   Anesthesia Other Findings   Reproductive/Obstetrics negative OB ROS                             Anesthesia Physical Anesthesia Plan  ASA: II  Anesthesia Plan: General   Post-op Pain Management:    Induction: Intravenous  Airway Management Planned: Oral ETT  Additional Equipment:   Intra-op Plan:   Post-operative Plan: Extubation in OR  Informed Consent: I have reviewed the patients History and Physical, chart, labs and discussed the procedure including the risks, benefits and alternatives for the proposed anesthesia with the patient or authorized representative who has indicated his/her understanding and acceptance.   Dental advisory given  Plan Discussed with: CRNA  Anesthesia Plan Comments:         Anesthesia Quick Evaluation

## 2016-02-24 NOTE — H&P (Signed)
TOTAL HIP ADMISSION H&P  Patient is admitted for left total hip arthroplasty.  Subjective:  Chief Complaint: left hip pain  HPI: Kathy Duran, 70 y.o. female, has a history of pain and functional disability in the left hip(s) due to arthritis and patient has failed non-surgical conservative treatments for greater than 12 weeks to include NSAID's and/or analgesics, corticosteriod injections, flexibility and strengthening excercises, supervised PT with diminished ADL's post treatment, use of assistive devices, weight reduction as appropriate and activity modification.  Onset of symptoms was gradual starting 4 years ago with slowly worsening course since that time.The patient noted no past surgery on the left hip(s).  Patient currently rates pain in the left hip at 10 out of 10 with activity. Patient has night pain, worsening of pain with activity and weight bearing, trendelenberg gait, pain that interfers with activities of daily living and pain with passive range of motion. Patient has evidence of subchondral sclerosis, periarticular osteophytes and joint space narrowing by imaging studies. This condition presents safety issues increasing the risk of falls.  There is no current active infection.  Patient Active Problem List   Diagnosis Date Noted  . Osteoarthritis of left hip 02/24/2016  . Varicose veins 04/20/2015  . Lipidemia 04/20/2015  . Rheumatoid arthritis (HCC) 11/20/2013  . Low back pain 11/20/2013  . HTN (hypertension) 11/20/2013  . GERD (gastroesophageal reflux disease) 11/20/2013  . Bladder spasm 11/20/2013  . Hyperlipidemia 11/20/2013  . Status post right hip replacement 11/20/2013  . Bilateral swelling of feet   . Bruises easily    Past Medical History:  Diagnosis Date  . Anxiety   . Arthritis    left hip  . Asthma    Inhalers for tx  . Bilateral swelling of feet    bilateral ankle and feet edema  . Bruises easily   . Hypertension   . Sinus complaint   . Sjogren's  syndrome (HCC)    dry eyes  . Urinary incontinence    occ. an issue wears pads    Past Surgical History:  Procedure Laterality Date  . ABDOMINAL HYSTERECTOMY    . BACK SURGERY    . BREAST SURGERY Right    cyst drainage  . SHOULDER SURGERY Right    right shoulder tendon and rotator cuff repair  . TOTAL HIP ARTHROPLASTY Right 2006    Prescriptions Prior to Admission  Medication Sig Dispense Refill Last Dose  . albuterol (PROVENTIL) (2.5 MG/3ML) 0.083% nebulizer solution Take 2.5 mg by nebulization as needed for wheezing.   Taking  . aspirin 81 MG tablet Take 81 mg by mouth daily.   Taking  . atorvastatin (LIPITOR) 40 MG tablet Take 1 tablet (40 mg total) by mouth daily. 90 tablet 2   . cetirizine (ZYRTEC) 10 MG chewable tablet Chew 10 mg by mouth daily.   Taking  . Cholecalciferol (VITAMIN D-3 PO) Take 1 tablet by mouth daily.    Taking  . estradiol (VIVELLE-DOT) 0.1 MG/24HR patch Place 1 patch onto the skin 2 (two) times a week.   Taking  . Fluticasone-Salmeterol (ADVAIR) 100-50 MCG/DOSE AEPB Inhale 1 puff into the lungs daily.    Taking  . Homeopathic Products (ARNICARE EX) Apply 1 application topically daily as needed (pain).   02/16/2016 at Unknown time  . HYDROcodone-acetaminophen (NORCO/VICODIN) 5-325 MG tablet Take 1 tablet by mouth every 6 (six) hours as needed for moderate pain.    Taking  . ipratropium (ATROVENT) 0.06 % nasal spray Place 2 sprays into  both nostrils 4 (four) times daily. 15 mL 12 Taking  . KRILL OIL PO Take 1 tablet by mouth daily.    Taking  . lidocaine (LIDODERM) 5 % Place 1 patch onto the skin as needed. Remove & Discard patch within 12 hours or as directed by MD   Taking  . losartan (COZAAR) 50 MG tablet Take 50 mg by mouth daily.   Taking  . methocarbamol (ROBAXIN) 500 MG tablet Take 1 tablet by mouth 2 (two) times daily as needed for muscle spasms.    Taking  . mometasone (NASONEX) 50 MCG/ACT nasal spray Place 2 sprays into the nose daily.   Taking  .  montelukast (SINGULAIR) 10 MG tablet Take 10 mg by mouth at bedtime as needed (allergies).    Taking  . Multiple Vitamin (MULTIVITAMIN) capsule Take 1 capsule by mouth daily.   Taking  . pantoprazole (PROTONIX) 40 MG tablet Take 40 mg by mouth daily as needed (reflux).    Taking  . RESTASIS 0.05 % ophthalmic emulsion Place 1 drop into both eyes 2 (two) times daily.   Taking  . traMADol (ULTRAM) 50 MG tablet Take 50 mg by mouth every 6 (six) hours as needed for pain.   Taking  . zolpidem (AMBIEN) 10 MG tablet Take 1 tablet by mouth at bedtime as needed for sleep.    Taking   No Known Allergies  Social History  Substance Use Topics  . Smoking status: Former Smoker    Quit date: 11/21/1983  . Smokeless tobacco: Never Used  . Alcohol use No    Family History  Problem Relation Age of Onset  . Arrhythmia Mother   . Stroke Mother   . Hyperlipidemia Mother   . Diabetes Mother   . Hypertension Mother   . Heart attack Father   . Diabetes Father   . Cancer Father   . Hypertension Father   . Hyperlipidemia Maternal Grandmother   . Hypertension Maternal Grandmother   . Heart attack Paternal Grandfather   . Stroke Paternal Grandfather   . Asthma Brother   . Emphysema Brother   . Hypertension Brother   . Diabetes Sister   . Hypertension Sister   . Hyperlipidemia Sister      Review of Systems  Musculoskeletal: Positive for joint pain.  All other systems reviewed and are negative.   Objective:  Physical Exam  Constitutional: She is oriented to person, place, and time. She appears well-developed and well-nourished.  HENT:  Head: Normocephalic and atraumatic.  Eyes: EOM are normal. Pupils are equal, round, and reactive to light.  Neck: Normal range of motion. Neck supple.  Cardiovascular: Normal rate and regular rhythm.   Respiratory: Effort normal and breath sounds normal.  GI: Soft. Bowel sounds are normal.  Musculoskeletal:       Left hip: She exhibits decreased range of motion,  decreased strength, tenderness and bony tenderness.  Neurological: She is alert and oriented to person, place, and time.  Skin: Skin is warm and dry.  Psychiatric: She has a normal mood and affect.    Vital signs in last 24 hours: Temp:  [97.7 F (36.5 C)] 97.7 F (36.5 C) (08/04 0659) Pulse Rate:  [93] 93 (08/04 0659) Resp:  [18] 18 (08/04 0659) BP: (138)/(75) 138/75 (08/04 0659) SpO2:  [98 %] 98 % (08/04 0659) Weight:  [87.7 kg (193 lb 6 oz)] 87.7 kg (193 lb 6 oz) (08/04 0705)  Labs:   Estimated body mass index is 32.18 kg/m  as calculated from the following:   Height as of this encounter: 5\' 5"  (1.651 m).   Weight as of this encounter: 87.7 kg (193 lb 6 oz).   Imaging Review Plain radiographs demonstrate severe degenerative joint disease of the left hip(s). The bone quality appears to be good for age and reported activity level.  Assessment/Plan:  End stage arthritis, left hip(s)  The patient history, physical examination, clinical judgement of the provider and imaging studies are consistent with end stage degenerative joint disease of the left hip(s) and total hip arthroplasty is deemed medically necessary. The treatment options including medical management, injection therapy, arthroscopy and arthroplasty were discussed at length. The risks and benefits of total hip arthroplasty were presented and reviewed. The risks due to aseptic loosening, infection, stiffness, dislocation/subluxation,  thromboembolic complications and other imponderables were discussed.  The patient acknowledged the explanation, agreed to proceed with the plan and consent was signed. Patient is being admitted for inpatient treatment for surgery, pain control, PT, OT, prophylactic antibiotics, VTE prophylaxis, progressive ambulation and ADL's and discharge planning.The patient is planning to be discharged to skilled nursing facility

## 2016-02-24 NOTE — Anesthesia Procedure Notes (Signed)
Procedure Name: Intubation Date/Time: 02/24/2016 9:00 AM Performed by: Orest Dikes Pre-anesthesia Checklist: Patient identified, Emergency Drugs available, Suction available and Patient being monitored Patient Re-evaluated:Patient Re-evaluated prior to inductionOxygen Delivery Method: Circle system utilized Preoxygenation: Pre-oxygenation with 100% oxygen Intubation Type: IV induction Ventilation: Mask ventilation without difficulty Laryngoscope Size: Mac and 4 Grade View: Grade II Tube type: Oral Tube size: 7.5 mm Number of attempts: 1 Airway Equipment and Method: Stylet Placement Confirmation: ETT inserted through vocal cords under direct vision,  positive ETCO2 and breath sounds checked- equal and bilateral Secured at: 21 cm Tube secured with: Tape Dental Injury: Teeth and Oropharynx as per pre-operative assessment

## 2016-02-24 NOTE — Progress Notes (Signed)
Surgery this admission; weakness

## 2016-02-24 NOTE — Op Note (Signed)
NAMELINNA, Kathy Duran                ACCOUNT NO.:  [id number]  MEDICAL RECORD NO.:  [id number]  LOCATION:  [facility]                         FACILITY:  [facility]  PHYSICIAN:  Vanita Panda. Magnus Ivan, M.D.DATE OF BIRTH:  1945/10/15  DATE OF PROCEDURE:  02/24/2016 DATE OF DISCHARGE:                              OPERATIVE REPORT   PREOPERATIVE DIAGNOSIS:  Severe osteoarthritis and degenerative joint disease of left hip.  POSTOPERATIVE DIAGNOSIS:  Severe osteoarthritis and degenerative joint disease of left hip.  PROCEDURE:  Left total hip arthroplasty through direct anterior approach.  IMPLANTS:  DePuy Sector Gription acetabular component size 50, size 32+ 0 neutral polyethylene liner, size 9 Corail femoral component with standard offset, size 32+ 1 ceramic hip ball.  SURGEON:  Vanita Panda. Magnus Ivan, M.D.  ASSISTANT:  Richardean Canal, PA-C.  ANESTHESIA:  general.  ANTIBIOTICS:  2 g of IV Ancef.  BLOOD LOSS:  250 mL.  COMPLICATIONS:  None.  INDICATIONS:  Kathy Duran is a 70 year old female, well known to our practice.  She has debilitating left hip pain and known osteoarthritis of that left hip.  She actually had a right total hip arthroplasty done back in 2006.  She is actually a little longer on the right side than left.  Her pain in the left side though has become daily, it is detrimentally affected her activities of daily living, her quality of life and her mobility.  At this point, she does wish to proceed with the total hip arthroplasty.  She understands the risk of acute blood loss anemia, nerve and vessel injury, fracture, infection, dislocation, DVT. She understands our goals are decreased pain, improved mobility and overall improved quality of life.  PROCEDURE DESCRIPTION:  After informed consent was obtained, appropriate left hip was marked.  She was brought to the operating room and general anesthesia was obtained while she was on her stretcher.  A Foley catheter was  placed and then traction boots were placed on both of her feet.  Next, she was placed supine on the Hana fracture table with the perineal post in place and both legs in inline skeletal traction devices, no traction applied.  Of note, her left leg is shorter than the right.  Her left operative hip was then prepped and draped with DuraPrep and sterile drapes.  A time-out was called and she was identified as correct patient and correct left hip.  I then made an incision inferior and posterior to the anterior superior iliac spine and carried this obliquely down the leg.  We dissected down the tensor fascia lata muscle.  The tensor fascia was then divided longitudinally, so we could proceed with direct anterior approach to the hip.  We identified and cauterized the circumflex vessels and then identified the hip capsule. I opened up the hip capsule in L-type format, finding a very large joint effusion.  We then placed our Cobra retractors within the hip capsule and made our femoral neck cut with an oscillating saw proximal to the lesser trochanter and completed this with an osteotome.  We placed a corkscrew guide in the femoral head and removed the femoral head in its entirety and found it to be completely devoid of  cartilage.  We then cleaned the acetabulum and remnants of the acetabular labrum.  I placed a bent Hohmann over the medial acetabular rim.  We cleaned the remnants of the acetabular labrum and then began reaming under direct visualization from a size 42 reamer up to a size 50.  With the size 52, there was a tight fit.  We placed this reamer under direct visualization as well as direct fluoroscopy, so we could obtain our depth of reaming, our inclination and anteversion.  Once we were pleased with this, we placed the real DePuy Sector Gription acetabular component, size 50 and a 32+ 0 neutral polyethylene liner for that size acetabular component. Attention was then turned to the  femur.  With the leg externally rotated to 120 degrees, extended and adducted, we were able to place a Mueller retractor medially and a Hohmann retractor behind the greater trochanter.  We released the lateral joint capsule and used a box cutting osteotome to enter the femoral canal and a rongeur to lateralize.  We then began broaching from a size 8 broach using the Corail broaching system up to a size only 9.  She had really tight canal.  With the 9 in place, we trialed a standard offset femoral neck and the 32+ 1 hip ball.  We brought the leg back over and up with traction and internal rotation reducing the pelvis.  We were pleased with leg length, offset and stability.  We then dislocated the hip and removed the trial components.  We were able to place the real size 9 Corail femoral component with standard offset.  We placed the real 32+ 1 ceramic hip ball, we reduced this in the acetabulum.  Once again, we felt we were pleased with stability, leg length and offset.  We then irrigated the hip with normal saline solution using pulsatile lavage. We closed the joint capsule with interrupted #1 Ethibond suture followed by running #1 Vicryl in the tensor fascia, 0 Vicryl in the deep tissue, 2-0 Vicryl in the subcutaneous tissue, interrupted staples on the skin, and Xeroform and Aquacel dressing were applied.  She was taken off the Hana table, awakened, extubated and taken to the recovery room in stable condition.  All final counts were correct.  There were no complications noted.  Of note, Richardean Canal, PA-C, assisted in the entire case.  His assistance was crucial for facilitating all aspects of this case.     Vanita Panda. Magnus Ivan, M.D.     CYB/MEDQ  D:  02/24/2016  T:  02/24/2016  Job:  [id number]

## 2016-02-24 NOTE — Anesthesia Postprocedure Evaluation (Signed)
Anesthesia Post Note  Patient: Kathy Duran  Procedure(s) Performed: Procedure(s) (LRB): LEFT TOTAL HIP ARTHROPLASTY ANTERIOR APPROACH (Left)  Patient location during evaluation: PACU Anesthesia Type: General Level of consciousness: awake and alert Pain management: pain level controlled Vital Signs Assessment: post-procedure vital signs reviewed and stable Respiratory status: spontaneous breathing, nonlabored ventilation, respiratory function stable and patient connected to nasal cannula oxygen Cardiovascular status: blood pressure returned to baseline and stable Postop Assessment: no signs of nausea or vomiting Anesthetic complications: no    Last Vitals:  Vitals:   02/24/16 1040 02/24/16 1100  BP: (!) 144/81 138/78  Pulse: (!) 103 96  Resp: 15 16  Temp: 36.4 C     Last Pain:  Vitals:   02/24/16 1100  TempSrc:   PainSc: 8     LLE Motor Response: Purposeful movement (02/24/16 1100) LLE Sensation: Full sensation (02/24/16 1100)          Phillips Grout

## 2016-02-24 NOTE — Care Management Note (Signed)
Case Management Note  Patient Details  Name: Kathy Duran MRN: [id number] Date of Birth: 1946/04/01  Subjective/Objective:   70 y/o f admitted w/OA hip. S/p THA. From home. Has cane. [facility] dme rep has already brought rw to rm.[facility] rep Jorja Loa already following PTA-patient agrees to [place]. Await PT cons.                 Action/Plan:d/c plan home w/HHC/DME.   Expected Discharge Date:                  Expected Discharge Plan:  Home w Home Health Services  In-House Referral:     Discharge planning Services  CM Consult  Post Acute Care Choice:  Home Health [facility] already following PTA.) Choice offered to:  Patient  DME Arranged:  Walker rolling DME Agency:  [facility]  HH Arranged:    HH Agency:  [facility] (now [facility])  Status of Service:  In process, will continue to follow  If discussed at Long Length of Stay Meetings, dates discussed:    Additional Comments:  Lanier Clam, RN 02/24/2016, 3:14 PM

## 2016-02-24 NOTE — Brief Op Note (Signed)
02/24/2016  10:24 AM  PATIENT:  Kathy Duran  70 y.o. female  PRE-OPERATIVE DIAGNOSIS:  primary osteoarthritis left hip  POST-OPERATIVE DIAGNOSIS:  same  PROCEDURE:  Procedure(s): LEFT TOTAL HIP ARTHROPLASTY ANTERIOR APPROACH (Left)  SURGEON:  Surgeon(s) and Role:    * Kathryne Hitch, MD - Primary  PHYSICIAN ASSISTANT: Rexene Edison, PA-C  ANESTHESIA:   general  EBL:  Total I/O In: 1000 [I.V.:1000] Out: 650 [Urine:400; Blood:250]  COUNTS:  YES   PLAN OF CARE: Admit to inpatient   PATIENT DISPOSITION:  PACU - hemodynamically stable.   Delay start of Pharmacological VTE agent (>24hrs) due to surgical blood loss or risk of bleeding: no

## 2016-02-24 NOTE — Evaluation (Signed)
Physical Therapy Evaluation Patient Details Name: Kathy Duran MRN: [id number] DOB: 06-27-1946 Today's Date: 02/24/2016   History of Present Illness  Pt s/p L THR and with hx of R THR (posterior 2016), back surgery and R RCR  Clinical Impression  Pt s/p L THR presents with decreased L LE strength/ROM and post op pain limiting functional mobility.  Pt should progress to dc home with family assist and HHPT follow up.    Follow Up Recommendations Home health PT    Equipment Recommendations  Rolling walker with 5" wheels    Recommendations for Other Services OT consult     Precautions / Restrictions Precautions Precautions: Fall Restrictions Weight Bearing Restrictions: No Other Position/Activity Restrictions: WBAT      Mobility  Bed Mobility Overal bed mobility: Needs Assistance Bed Mobility: Supine to Sit     Supine to sit: Mod assist     General bed mobility comments: Increased time, cues for sequence and use of L LE to self assist.  Physical assist to manage R LE and    Transfers Overall transfer level: Needs assistance Equipment used: Rolling walker (2 wheeled) Transfers: Sit to/from Stand Sit to Stand: Min assist;Mod assist;From elevated surface         General transfer comment: cues for LE management and use of UEs to self assist  Ambulation/Gait Ambulation/Gait assistance: Min assist Ambulation Distance (Feet): 19 Feet Assistive device: Rolling walker (2 wheeled) Gait Pattern/deviations: Step-to pattern;Decreased step length - right;Decreased step length - left;Shuffle;Trunk flexed Gait velocity: decr Gait velocity interpretation: Below normal speed for age/gender General Gait Details: cues for sequence, posture and position from [employer]            Wheelchair Mobility    Modified Rankin (Stroke Patients Only)       Balance                                             Pertinent Vitals/Pain Pain Assessment:  0-10 Pain Score: 6  Pain Location: R hip Pain Descriptors / Indicators: Aching;Sore Pain Intervention(s): Limited activity within patient's tolerance;Monitored during session;Premedicated before session;Ice applied    Home Living Family/patient expects to be discharged to:: Private residence Living Arrangements: Spouse/significant other Available Help at Discharge: Family Type of Home: House Home Access: Stairs to enter Entrance Stairs-Rails: Right;Left;Can reach both Secretary/administrator of Steps: 2 Home Layout: One level Home Equipment: Cane - single point      Prior Function Level of Independence: Independent               Hand Dominance        Extremity/Trunk Assessment   Upper Extremity Assessment: Overall WFL for tasks assessed           Lower Extremity Assessment: LLE deficits/detail      Cervical / Trunk Assessment: Normal  Communication   Communication: No difficulties  Cognition Arousal/Alertness: Awake/alert Behavior During Therapy: WFL for tasks assessed/performed Overall Cognitive Status: Within Functional Limits for tasks assessed                      General Comments      Exercises Total Joint Exercises Ankle Circles/Pumps: AROM;Both;15 reps;Supine      Assessment/Plan    PT Assessment Patient needs continued PT services  PT Diagnosis Difficulty walking   PT Problem List Decreased strength;Decreased range  of motion;Decreased activity tolerance;Decreased mobility;Decreased knowledge of use of DME;Pain  PT Treatment Interventions DME instruction;Gait training;Stair training;Functional mobility training;Therapeutic activities;Therapeutic exercise;Patient/family education   PT Goals (Current goals can be found in the Care Plan section) Acute Rehab PT Goals Patient Stated Goal: Regain IND and walk without pain PT Goal Formulation: With patient Time For Goal Achievement: 03/06/16 Potential to Achieve Goals: Good     Frequency 7X/week   Barriers to discharge        Co-evaluation               End of Session Equipment Utilized During Treatment: Gait belt Activity Tolerance: Patient limited by fatigue;Patient tolerated treatment well Patient left: in chair;with call bell/phone within reach;with family/visitor present Nurse Communication: Mobility status         Time: 2633-3545 PT Time Calculation (min) (ACUTE ONLY): 29 min   Charges:   PT Evaluation $PT Eval Low Complexity: 1 Procedure PT Treatments $Gait Training: 8-22 mins   PT G Codes:        Jereline Ticer 25-Mar-2016, 5:45 PM

## 2016-02-25 LAB — BASIC METABOLIC PANEL
Anion gap: 7 (ref 5–15)
BUN: 7 mg/dL (ref 6–20)
CO2: 25 mmol/L (ref 22–32)
Calcium: 8.7 mg/dL — ABNORMAL LOW (ref 8.9–10.3)
Chloride: 105 mmol/L (ref 101–111)
Creatinine, Ser: 0.59 mg/dL (ref 0.44–1.00)
GFR calc Af Amer: >60 mL/min (ref >60)
GFR calc non Af Amer: >60 mL/min (ref >60)
Glucose, Bld: 120 mg/dL — ABNORMAL HIGH (ref 65–99)
Potassium: 3.7 mmol/L (ref 3.5–5.1)
Sodium: 137 mmol/L (ref 135–145)

## 2016-02-25 LAB — CBC
HCT: 31.8 % — ABNORMAL LOW (ref 36.0–46.0)
Hemoglobin: 10.7 g/dL — ABNORMAL LOW (ref 12.0–15.0)
MCH: 29.9 pg (ref 26.0–34.0)
MCHC: 33.6 g/dL (ref 30.0–36.0)
MCV: 88.8 fL (ref 78.0–100.0)
Platelets: 257 10*3/uL (ref 150–400)
RBC: 3.58 MIL/uL — ABNORMAL LOW (ref 3.87–5.11)
RDW: 13.8 % (ref 11.5–15.5)
WBC: 6.9 10*3/uL (ref 4.0–10.5)

## 2016-02-25 NOTE — Evaluation (Signed)
Occupational Therapy Evaluation Patient Details Name: Kathy Duran MRN: [id number] DOB: Nov 27, 1945 Today's Date: 02/25/2016    History of Present Illness Pt s/p L THR and with hx of R THR (posterior 2006, per pt), back surgery and R RCR   Clinical Impression   This 70 year old female was admitted for the above sx.  She will benefit from one more session of OT in acute setting to increase safety and independence with toilet transfers.  She will have 24/7 for a week, then intermittent assistance.    Follow Up Recommendations  Supervision/Assistance - 24 hour    Equipment Recommendations  3 in 1 bedside comode    Recommendations for Other Services       Precautions / Restrictions Precautions Precautions: Fall Restrictions Weight Bearing Restrictions: No      Mobility Bed Mobility               General bed mobility comments: oob  Transfers Overall transfer level: Needs assistance Equipment used: Rolling walker (2 wheeled)   Sit to Stand: Min assist         General transfer comment: assist to rise and steady. Cues for UE/LE placement    Balance                                            ADL Overall ADL's : Needs assistance/impaired     Grooming: Set up;Sitting   Upper Body Bathing: Min guard;Sitting   Lower Body Bathing: Minimal assistance;Sit to/from stand   Upper Body Dressing : Set up;Sitting   Lower Body Dressing: Moderate assistance;Sit to/from stand                 General ADL Comments: performed ADL from recliner, sit to stand. Educated on AE and DME.  Pt had tub bench last sx; told that this is an out of pocket expense, and she plans to sponge bathe.  Pt verbalizes understanding of AE and working within pain tolerance     Vision     Perception     Praxis      Pertinent Vitals/Pain Pain Assessment: 0-10 Pain Score: 4  Pain Location: L hip Pain Descriptors / Indicators: Aching Pain Intervention(s): Limited  activity within patient's tolerance;Monitored during session;Premedicated before session;Repositioned;Ice applied     Hand Dominance     Extremity/Trunk Assessment Upper Extremity Assessment Upper Extremity Assessment: Overall WFL for tasks assessed           Communication Communication Communication: No difficulties   Cognition Arousal/Alertness: Awake/alert Behavior During Therapy: WFL for tasks assessed/performed Overall Cognitive Status: Within Functional Limits for tasks assessed                     General Comments       Exercises       Shoulder Instructions      Home Living Family/patient expects to be discharged to:: Private residence Living Arrangements: Spouse/significant other Available Help at Discharge: Family               Bathroom Shower/Tub: Tub/shower unit Shower/tub characteristics: Curtain Bathroom Toilet: Standard     Home Equipment: [employer] Mor - single point   Additional Comments: last hip sx was '06. Does not still have DME/AE      Prior Functioning/Environment Level of Independence: Independent  OT Diagnosis: Acute pain   OT Problem List: Decreased strength;Pain;Decreased knowledge of use of DME or AE   OT Treatment/Interventions: Self-care/ADL training;DME and/or AE instruction;Patient/family education    OT Goals(Current goals can be found in the care plan section) Acute Rehab OT Goals Patient Stated Goal: Regain IND and walk without pain OT Goal Formulation: With patient Time For Goal Achievement: 03/03/16 Potential to Achieve Goals: Good ADL Goals Pt Will Perform Grooming: with supervision;standing Pt Will Transfer to Toilet: with min guard assist;bedside commode;ambulating Pt Will Perform Toileting - Clothing Manipulation and hygiene: with min guard assist;sit to/from stand  OT Frequency: Min 2X/week   Barriers to D/C:            Co-evaluation              End of Session    Activity  Tolerance: Patient tolerated treatment well Patient left: in chair;with call bell/phone within reach;with chair alarm set   Time: 8341-9622 OT Time Calculation (min): 26 min Charges:  OT General Charges $OT Visit: 1 Procedure OT Evaluation $OT Eval Low Complexity: 1 Procedure OT Treatments $Self Care/Home Management : 8-22 mins G-Codes:    Bao Coreas 2016/03/23, 11:18 AM  Marica Otter, OTR/L [phone] 23-Mar-2016

## 2016-02-25 NOTE — Progress Notes (Signed)
OT Cancellation Note  Patient Details Name: Kathy Duran MRN: [id number] DOB: August 24, 1945   Cancelled Treatment:    Reason Eval/Treat Not Completed: Other (comment).  Pt got up and used bathroom with NT's assistance. She feels comfortable with this. Will sign off.   Gaven Eugene 02/25/2016, 2:23 PM  Marica Otter, OTR/L [phone] 02/25/2016

## 2016-02-25 NOTE — Progress Notes (Signed)
Physical Therapy Treatment Patient Details Name: Kathy Duran MRN: [id number] DOB: 11-23-1945 Today's Date: 03-04-2016    History of Present Illness Pt s/p L THR and with hx of R THR (posterior 2016), back surgery and R RCR    PT Comments    Steady progress with mobility but pt struggling with in/out of bed.  Follow Up Recommendations  Home health PT     Equipment Recommendations  Rolling walker with 5" wheels    Recommendations for Other Services OT consult     Precautions / Restrictions Precautions Precautions: Fall Restrictions Weight Bearing Restrictions: No Other Position/Activity Restrictions: WBAT    Mobility  Bed Mobility Overal bed mobility: Needs Assistance Bed Mobility: Sit to Supine       Sit to supine: Mod assist   General bed mobility comments: cues for sequence.  Physical assist for bil LEs  Transfers Overall transfer level: Needs assistance Equipment used: Rolling walker (2 wheeled) Transfers: Sit to/from Stand Sit to Stand: Min assist         General transfer comment: assist to rise and steady. Cues for UE/LE placement  Ambulation/Gait Ambulation/Gait assistance: Min assist;Min guard Ambulation Distance (Feet): 75 Feet (twice) Assistive device: Rolling walker (2 wheeled) Gait Pattern/deviations: Step-to pattern;Decreased step length - right;Decreased step length - left;Shuffle;Trunk flexed Gait velocity: decr Gait velocity interpretation: Below normal speed for age/gender General Gait Details: cues for sequence, posture and position from [employer]            Wheelchair Mobility    Modified Rankin (Stroke Patients Only)       Balance                                    Cognition Arousal/Alertness: Awake/alert Behavior During Therapy: WFL for tasks assessed/performed Overall Cognitive Status: Within Functional Limits for tasks assessed                      Exercises      General Comments         Pertinent Vitals/Pain Pain Assessment: 0-10 Pain Score: 6  Pain Location: L hip Pain Descriptors / Indicators: Aching;Sore Pain Intervention(s): Limited activity within patient's tolerance;Monitored during session;Premedicated before session;Ice applied    Home Living                      Prior Function            PT Goals (current goals can now be found in the care plan section) Acute Rehab PT Goals Patient Stated Goal: Regain IND and walk without pain PT Goal Formulation: With patient Time For Goal Achievement: 03/06/16 Potential to Achieve Goals: Good Progress towards PT goals: Progressing toward goals    Frequency  7X/week    PT Plan Current plan remains appropriate    Co-evaluation             End of Session Equipment Utilized During Treatment: Gait belt Activity Tolerance: Patient tolerated treatment well Patient left: in bed;with call bell/phone within reach     Time: 7026-3785 PT Time Calculation (min) (ACUTE ONLY): 32 min  Charges:  $Gait Training: 23-37 mins                    G Codes:      Kathy Duran March 04, 2016, 4:07 PM

## 2016-02-25 NOTE — Progress Notes (Signed)
Contacted [facility] for 3n1 bedside commode for home. Atara Paterson RN CCM Case Mgmt phone [phone] 

## 2016-02-25 NOTE — Progress Notes (Signed)
Physical Therapy Treatment Patient Details Name: Kathy Duran MRN: [id number] DOB: 1946-02-10 Today's Date: 02/25/2016    History of Present Illness Pt s/p L THR and with hx of R THR (posterior 2016), back surgery and R RCR    PT Comments    Pt moving slowly but progressing steadily with mobility.  Follow Up Recommendations  Home health PT     Equipment Recommendations  Rolling walker with 5" wheels    Recommendations for Other Services OT consult     Precautions / Restrictions Precautions Precautions: Fall Restrictions Weight Bearing Restrictions: No    Mobility  Bed Mobility Overal bed mobility: Needs Assistance Bed Mobility: Supine to Sit     Supine to sit: Mod assist     General bed mobility comments: cues for sequence and use of R LE to self assist.  Physical assist to manage L LE and to bring trunk to upright  Transfers Overall transfer level: Needs assistance Equipment used: Rolling walker (2 wheeled) Transfers: Sit to/from Stand Sit to Stand: Min assist         General transfer comment: assist to rise and steady. Cues for UE/LE placement  Ambulation/Gait Ambulation/Gait assistance: Min assist Ambulation Distance (Feet): 39 Feet Assistive device: Rolling walker (2 wheeled) Gait Pattern/deviations: Step-to pattern;Decreased step length - right;Decreased step length - left;Shuffle;Trunk flexed Gait velocity: decr Gait velocity interpretation: Below normal speed for age/gender General Gait Details: cues for sequence, posture and position from [employer]            Wheelchair Mobility    Modified Rankin (Stroke Patients Only)       Balance                                    Cognition Arousal/Alertness: Awake/alert Behavior During Therapy: WFL for tasks assessed/performed Overall Cognitive Status: Within Functional Limits for tasks assessed                      Exercises Total Joint Exercises Ankle  Circles/Pumps: AROM;Both;15 reps;Supine Quad Sets: AROM;Both;10 reps;Supine Heel Slides: AAROM;Left;20 reps;Supine Hip ABduction/ADduction: AAROM;Left;15 reps;Supine    General Comments        Pertinent Vitals/Pain Pain Assessment: 0-10 Pain Score: 6  Pain Location: L hip Pain Descriptors / Indicators: Aching Pain Intervention(s): Limited activity within patient's tolerance;Monitored during session;Premedicated before session;Ice applied    Home Living Family/patient expects to be discharged to:: Private residence Living Arrangements: Spouse/significant other Available Help at Discharge: Family         Home Equipment: [employer] - single point Additional Comments: last hip sx was '06. Does not still have DME/AE    Prior Function Level of Independence: Independent          PT Goals (current goals can now be found in the care plan section) Acute Rehab PT Goals Patient Stated Goal: Regain IND and walk without pain PT Goal Formulation: With patient Time For Goal Achievement: 03/06/16 Potential to Achieve Goals: Good Progress towards PT goals: Progressing toward goals    Frequency  7X/week    PT Plan Current plan remains appropriate    Co-evaluation             End of Session Equipment Utilized During Treatment: Gait belt Activity Tolerance: Patient tolerated treatment well Patient left: in chair;with call bell/phone within reach     Time: 0821-0900 PT Time Calculation (min) (ACUTE  ONLY): 39 min  Charges:  $Gait Training: 23-37 mins $Therapeutic Exercise: 8-22 mins                    G Codes:      Katerin Negrete February 26, 2016, 12:03 PM

## 2016-02-25 NOTE — Discharge Instructions (Signed)

## 2016-02-25 NOTE — Progress Notes (Signed)
Subjective: 1 Day Post-Op Procedure(s) (LRB): LEFT TOTAL HIP ARTHROPLASTY ANTERIOR APPROACH (Left) Patient reports pain as moderate.  Has been up with therapy.  Asymptomatic acute blood loss anemia.  Objective: Vital signs in last 24 hours: Temp:  [97.6 F (36.4 C)-99.7 F (37.6 C)] 98 F (36.7 C) (08/05 0927) Pulse Rate:  [77-102] 83 (08/05 0927) Resp:  [13-18] 16 (08/05 0927) BP: (102-135)/(61-85) 114/61 (08/05 0927) SpO2:  [97 %-100 %] 98 % (08/05 0945)  Intake/Output from previous day: 08/04 0701 - 08/05 0700 In: 4563.8 [P.O.:240; I.V.:3713.8; IV Piggyback:610] Out: 4250 [Urine:4000; Blood:250] Intake/Output this shift: Total I/O In: 240 [P.O.:240] Out: 350 [Urine:350]   Recent Labs  02/25/16 0436  HGB 10.7*    Recent Labs  02/25/16 0436  WBC 6.9  RBC 3.58*  HCT 31.8*  PLT 257    Recent Labs  02/25/16 0436  NA 137  K 3.7  CL 105  CO2 25  BUN 7  CREATININE 0.59  GLUCOSE 120*  CALCIUM 8.7*   No results for input(s): LABPT, INR in the last 72 hours.  Intact pulses distally Dorsiflexion/Plantar flexion intact Incision: dressing C/D/I  Assessment/Plan: 1 Day Post-Op Procedure(s) (LRB): LEFT TOTAL HIP ARTHROPLASTY ANTERIOR APPROACH (Left) Up with therapy Discharge home with home health Monday.  Adelle Zachar Y 02/25/2016, 11:00 AM

## 2016-02-26 MED ORDER — HYDROCODONE-ACETAMINOPHEN 5-325 MG PO TABS: 1.0000 | ORAL_TABLET | ORAL | 0 refills | Status: DC | PRN

## 2016-02-26 MED ORDER — METHOCARBAMOL 500 MG PO TABS: 500.0000 mg | ORAL_TABLET | Freq: Four times a day (QID) | ORAL | 0 refills | Status: DC | PRN

## 2016-02-26 MED ORDER — ASPIRIN 325 MG PO TBEC: 325.0000 mg | DELAYED_RELEASE_TABLET | Freq: Two times a day (BID) | ORAL | 0 refills | Status: DC

## 2016-02-26 NOTE — Progress Notes (Addendum)
Subjective: 2 Days Post-Op Procedure(s) (LRB): LEFT TOTAL HIP ARTHROPLASTY ANTERIOR APPROACH (Left) Patient reports pain as mild and moderate.    Objective: Vital signs in last 24 hours: Temp:  [98 F (36.7 C)-100.9 F (38.3 C)] 99.1 F (37.3 C) (08/06 0806) Pulse Rate:  [83-103] 103 (08/06 0550) Resp:  [16-18] 16 (08/06 0550) BP: (114-139)/(60-68) 124/60 (08/06 0550) SpO2:  [97 %-100 %] 98 % (08/06 0550)  Intake/Output from previous day: 08/05 0701 - 08/06 0700 In: 1332.5 [P.O.:660; I.V.:672.5] Out: 2175 [Urine:2175] Intake/Output this shift: Total I/O In: -  Out: 300 [Urine:300]   Recent Labs  02/25/16 0436  HGB 10.7*    Recent Labs  02/25/16 0436  WBC 6.9  RBC 3.58*  HCT 31.8*  PLT 257    Recent Labs  02/25/16 0436  NA 137  K 3.7  CL 105  CO2 25  BUN 7  CREATININE 0.59  GLUCOSE 120*  CALCIUM 8.7*   No results for input(s): LABPT, INR in the last 72 hours.  Neurologically intact  Assessment/Plan: 2 Days Post-Op Procedure(s) (LRB): LEFT TOTAL HIP ARTHROPLASTY ANTERIOR APPROACH (Left) Up with therapy likely home Monday  YATES,MARK C 02/26/2016, 8:18 AM

## 2016-02-26 NOTE — Progress Notes (Signed)
Referred to CSW  for ?SNF. Chart reviewed and noted plans for dc home with DME and home health  CSW to sign off- please contact us if SW needs arise. Reece Levy, MSW, LCSWA [phone] Weekend Coverage

## 2016-02-26 NOTE — Progress Notes (Signed)
Physical Therapy Treatment Patient Details Name: Kathy Duran MRN: [id number] DOB: Aug 15, 1945 Today's Date: 02/26/2016    History of Present Illness Pt s/p L THR and with hx of R THR (posterior 2016), back surgery and R RCR    PT Comments    Pt progressing well with mobility but continues to struggle with bed mobility.  Follow Up Recommendations  Home health PT     Equipment Recommendations  Rolling walker with 5" wheels    Recommendations for Other Services OT consult     Precautions / Restrictions Precautions Precautions: Fall Restrictions Weight Bearing Restrictions: No Other Position/Activity Restrictions: WBAT    Mobility  Bed Mobility Overal bed mobility: Needs Assistance Bed Mobility: Supine to Sit     Supine to sit: Mod assist     General bed mobility comments: cues for sequence.  Physical assist to manage L LE and to bring trunk to upright  Transfers Overall transfer level: Needs assistance Equipment used: Rolling walker (2 wheeled) Transfers: Sit to/from Stand Sit to Stand: Min assist;Min guard         General transfer comment: assist to rise and steady. Cues for UE/LE placement  Ambulation/Gait Ambulation/Gait assistance: Min guard Ambulation Distance (Feet): 140 Feet Assistive device: Rolling walker (2 wheeled) Gait Pattern/deviations: Step-to pattern;Step-through pattern;Decreased step length - right;Decreased step length - left;Shuffle;Trunk flexed Gait velocity: decr   General Gait Details: cues for sequence, posture and position from [employer]            Wheelchair Mobility    Modified Rankin (Stroke Patients Only)       Balance                                    Cognition Arousal/Alertness: Awake/alert Behavior During Therapy: WFL for tasks assessed/performed Overall Cognitive Status: Within Functional Limits for tasks assessed                      Exercises Total Joint Exercises Ankle  Circles/Pumps: AROM;Both;15 reps;Supine Quad Sets: AROM;Both;10 reps;Supine Heel Slides: AAROM;Left;20 reps;Supine Hip ABduction/ADduction: AAROM;Left;15 reps;Supine    General Comments        Pertinent Vitals/Pain Pain Assessment: 0-10 Pain Score: 4  Pain Location: L hip Pain Descriptors / Indicators: Aching;Sore Pain Intervention(s): Limited activity within patient's tolerance;Monitored during session;Premedicated before session;Ice applied    Home Living                      Prior Function            PT Goals (current goals can now be found in the care plan section) Acute Rehab PT Goals Patient Stated Goal: Regain IND and walk without pain PT Goal Formulation: With patient Time For Goal Achievement: 03/06/16 Potential to Achieve Goals: Good Progress towards PT goals: Progressing toward goals    Frequency  7X/week    PT Plan Current plan remains appropriate    Co-evaluation             End of Session Equipment Utilized During Treatment: Gait belt Activity Tolerance: Patient tolerated treatment well Patient left: with call bell/phone within reach;in chair;with family/visitor present     Time: 3014-9969 PT Time Calculation (min) (ACUTE ONLY): 33 min  Charges:  $Gait Training: 8-22 mins $Therapeutic Exercise: 8-22 mins  G Codes:      Gwendy Boeder 04-Mar-2016, 1:06 PM

## 2016-02-26 NOTE — Progress Notes (Signed)
Physical Therapy Treatment Patient Details Name: Kathy Duran MRN: [id number] DOB: [birth year] Today's Date: 02/26/2016    History of Present Illness Pt s/p L THR and with hx of R THR (posterior 2016), back surgery and R RCR    PT Comments    Pt progressing well with mobility and hopeful for dc home tomorrow.  Reviewed stairs with pt this pm.  Follow Up Recommendations  Home health PT     Equipment Recommendations  Rolling walker with 5" wheels    Recommendations for Other Services OT consult     Precautions / Restrictions Precautions Precautions: Fall Restrictions Weight Bearing Restrictions: No Other Position/Activity Restrictions: WBAT    Mobility  Bed Mobility Overal bed mobility: Needs Assistance Bed Mobility: Sit to Supine       Sit to supine: Min assist   General bed mobility comments: cues for sequence.  Physical assist to manage L LE   Transfers Overall transfer level: Needs assistance Equipment used: Rolling walker (2 wheeled) Transfers: Sit to/from Stand Sit to Stand: Min guard         General transfer comment: cues for LE management and use of UEs to self assist  Ambulation/Gait Ambulation/Gait assistance: Min guard;Supervision Ambulation Distance (Feet): 200 Feet Assistive device: Rolling walker (2 wheeled) Gait Pattern/deviations: Step-to pattern;Decreased step length - right;Decreased step length - left;Shuffle;Trunk flexed Gait velocity: decr Gait velocity interpretation: Below normal speed for age/gender General Gait Details: min cues for posture, position from RW, and initial sequence   Stairs            Wheelchair Mobility    Modified Rankin (Stroke Patients Only)       Balance                                    Cognition Arousal/Alertness: Awake/alert Behavior During Therapy: WFL for tasks assessed/performed Overall Cognitive Status: Within Functional Limits for tasks assessed                      Exercises      General Comments        Pertinent Vitals/Pain Pain Assessment: 0-10 Pain Score: 4  Pain Location: L hip Pain Descriptors / Indicators: Aching;Sore Pain Intervention(s): Limited activity within patient's tolerance;Monitored during session;Premedicated before session;Ice applied    Home Living                      Prior Function            PT Goals (current goals can now be found in the care plan section) Acute Rehab PT Goals Patient Stated Goal: Regain IND and walk without pain PT Goal Formulation: With patient Time For Goal Achievement: 03/06/16 Potential to Achieve Goals: Good Progress towards PT goals: Progressing toward goals    Frequency  7X/week    PT Plan Current plan remains appropriate    Co-evaluation             End of Session Equipment Utilized During Treatment: Gait belt Activity Tolerance: Patient tolerated treatment well Patient left: with call bell/phone within reach;in chair;with family/visitor present     Time: 1445-1510 PT Time Calculation (min) (ACUTE ONLY): 25 min  Charges:  $Gait Training: 8-22 mins $Therapeutic Activity: 8-22 mins                    G Codes:  Judy Goodenow 02/26/2016, 4:36 PM

## 2016-02-27 NOTE — Discharge Summary (Signed)
Patient ID: Kathy Duran MRN: [id number] DOB/AGE: [birth year] 70 y.o.  Admit date: 02/24/2016 Discharge date: 02/27/2016  Admission Diagnoses:  Principal Problem:   Osteoarthritis of left hip Active Problems:   Status post left hip replacement   Discharge Diagnoses:  Same  Past Medical History:  Diagnosis Date  . Anxiety   . Arthritis    left hip  . Asthma    Inhalers for tx  . Bilateral swelling of feet    bilateral ankle and feet edema  . Bruises easily   . Hypertension   . Sinus complaint   . Sjogren's syndrome (HCC)    dry eyes  . Urinary incontinence    occ. an issue wears pads    Surgeries: Procedure(s): LEFT TOTAL HIP ARTHROPLASTY ANTERIOR APPROACH on 02/24/2016   Consultants:   Discharged Condition: Improved  Hospital Course: Kathy Duran is an 70 y.o. female who was admitted 02/24/2016 for operative treatment ofOsteoarthritis of left hip. Patient has severe unremitting pain that affects sleep, daily activities, and work/hobbies. After pre-op clearance the patient was taken to the operating room on 02/24/2016 and underwent  Procedure(s): LEFT TOTAL HIP ARTHROPLASTY ANTERIOR APPROACH.    Patient was given perioperative antibiotics: Anti-infectives    Start     Dose/Rate Route Frequency Ordered Stop   02/24/16 1600  ceFAZolin (ANCEF) IVPB 1 g/50 mL premix     1 g 100 mL/hr over 30 Minutes Intravenous Every 6 hours 02/24/16 1247 02/24/16 2232   02/24/16 0652  ceFAZolin (ANCEF) IVPB 2g/100 mL premix     2 g 200 mL/hr over 30 Minutes Intravenous On call to O.R. 02/24/16 3790 02/24/16 0902       Patient was given sequential compression devices, early ambulation, and chemoprophylaxis to prevent DVT.  Patient benefited maximally from hospital stay and there were no complications.    Recent vital signs: Patient Vitals for the past 24 hrs:  BP Temp Temp src Pulse Resp SpO2  02/26/16 2137 (!) 129/55 100.2 F (37.9 C) Oral 94 15 99 %  02/26/16 2111 - - - - - 97 %   02/26/16 1424 (!) 130/57 98.6 F (37 C) Oral (!) 103 15 98 %  02/26/16 0936 - - - - - 92 %  02/26/16 0806 - 99.1 F (37.3 C) Oral - - -     Recent laboratory studies:  Recent Labs  02/25/16 0436  WBC 6.9  HGB 10.7*  HCT 31.8*  PLT 257  NA 137  K 3.7  CL 105  CO2 25  BUN 7  CREATININE 0.59  GLUCOSE 120*  CALCIUM 8.7*     Discharge Medications:     Medication List    STOP taking these medications   aspirin 81 MG tablet Replaced by:  aspirin 325 MG EC tablet     TAKE these medications   albuterol (2.5 MG/3ML) 0.083% nebulizer solution Commonly known as:  PROVENTIL Take 2.5 mg by nebulization as needed for wheezing.   ARNICARE EX Apply 1 application topically daily as needed (pain).   aspirin 325 MG EC tablet Take 1 tablet (325 mg total) by mouth 2 (two) times daily after a meal. Replaces:  aspirin 81 MG tablet   atorvastatin 40 MG tablet Commonly known as:  LIPITOR Take 1 tablet (40 mg total) by mouth daily.   cetirizine 10 MG chewable tablet Commonly known as:  ZYRTEC Chew 10 mg by mouth daily.   estradiol 0.1 MG/24HR patch Commonly known as:  VIVELLE-DOT Place  1 patch onto the skin 2 (two) times a week.   Fluticasone-Salmeterol 100-50 MCG/DOSE Aepb Commonly known as:  ADVAIR Inhale 1 puff into the lungs daily.   HYDROcodone-acetaminophen 5-325 MG tablet Commonly known as:  NORCO/VICODIN Take 1-2 tablets by mouth every 4 (four) hours as needed for moderate pain. What changed:  how much to take  when to take this   ipratropium 0.06 % nasal spray Commonly known as:  ATROVENT Place 2 sprays into both nostrils 4 (four) times daily.   KRILL OIL PO Take 1 tablet by mouth daily.   lidocaine 5 % Commonly known as:  LIDODERM Place 1 patch onto the skin as needed. Remove & Discard patch within 12 hours or as directed by MD   losartan 50 MG tablet Commonly known as:  COZAAR Take 50 mg by mouth daily.   methocarbamol 500 MG tablet Commonly  known as:  ROBAXIN Take 1 tablet (500 mg total) by mouth every 6 (six) hours as needed for muscle spasms. What changed:  when to take this   mometasone 50 MCG/ACT nasal spray Commonly known as:  NASONEX Place 2 sprays into the nose daily.   montelukast 10 MG tablet Commonly known as:  SINGULAIR Take 10 mg by mouth at bedtime as needed (allergies).   multivitamin capsule Take 1 capsule by mouth daily.   pantoprazole 40 MG tablet Commonly known as:  PROTONIX Take 40 mg by mouth daily as needed (reflux).   RESTASIS 0.05 % ophthalmic emulsion Generic drug:  cycloSPORINE Place 1 drop into both eyes 2 (two) times daily.   traMADol 50 MG tablet Commonly known as:  ULTRAM Take 50 mg by mouth every 6 (six) hours as needed for pain.   VITAMIN D-3 PO Take 1 tablet by mouth daily.   zolpidem 10 MG tablet Commonly known as:  AMBIEN Take 1 tablet by mouth at bedtime as needed for sleep.       Diagnostic Studies: Dg C-arm 1-60 Min-no Report  Result Date: 02/24/2016 CLINICAL DATA: SURGERY C-ARM 1-60 MINUTES Fluoroscopy was utilized by the requesting physician.  No radiographic interpretation.   Mm Screening Breast Tomo Bilateral  Result Date: 02/16/2016 CLINICAL DATA:  Screening. EXAM: 2D DIGITAL SCREENING BILATERAL MAMMOGRAM WITH CAD AND ADJUNCT TOMO COMPARISON:  Previous exam(s). ACR Breast Density Category c: The breast tissue is heterogeneously dense, which may obscure small masses. FINDINGS: There are no findings suspicious for malignancy. Images were processed with CAD. IMPRESSION: No mammographic evidence of malignancy. A result letter of this screening mammogram will be mailed directly to the patient. RECOMMENDATION: Screening mammogram in one year. (Code:SM-B-01Y) BI-RADS CATEGORY  1: Negative. Electronically Signed   By: Ted Mcalpine M.D.   On: 02/16/2016 10:39  Dg Hip Unilat With Pelvis 1v Left  Result Date: 02/24/2016 CLINICAL DATA:  Left total hip replacement. EXAM:  DG HIP (WITH OR WITHOUT PELVIS) 1V*L* ; DG C-ARM 1-60 MIN-NO REPORT COMPARISON:  None. FINDINGS: Changes of left hip replacement. Normal AP alignment. No hardware or bony complicating feature. IMPRESSION: Left hip replacement.  No complicating feature. Electronically Signed   By: Charlett Nose M.D.   On: 02/24/2016 10:33   Dg Hip Port Unilat With Pelvis 1v Left  Result Date: 02/24/2016 CLINICAL DATA:  Status post left hip replacement. EXAM: DG HIP (WITH OR WITHOUT PELVIS) 1V PORT LEFT COMPARISON:  Radiograph of October 18, 2015. FINDINGS: Status post left total hip arthroplasty. The femoral and acetabular components appear to be well situated. No fracture  or dislocation is noted. Expected postoperative findings are seen in the surrounding soft tissues. IMPRESSION: Status post left total hip arthroplasty. Electronically Signed   By: Lupita Raider, M.D.   On: 02/24/2016 11:15    Disposition: 01-Home or Self Care  Discharge Instructions    Call MD / Call 911    Complete by:  As directed   If you experience chest pain or shortness of breath, CALL 911 and be transported to the hospital emergency room.  If you develope a fever above 101 F, pus (white drainage) or increased drainage or redness at the wound, or calf pain, call your surgeon's office.   Constipation Prevention    Complete by:  As directed   Drink plenty of fluids.  Prune juice may be helpful.  You may use a stool softener, such as Colace (over the counter) 100 mg twice a day.  Use MiraLax (over the counter) for constipation as needed.   Diet - low sodium heart healthy    Complete by:  As directed   Discharge patient    Complete by:  As directed   Increase activity slowly as tolerated    Complete by:  As directed      Follow-up Information    [facility] .   Why:  HH physical therapy Contact information: [address] [place] [address] [phone]        Georgann Housekeeper, MD .   Specialty:  Internal  Medicine Contact information: [address] [place] [address] [phone]        Inc[facility] - [facility] .   Why:  rolling walker, 3n1 bedside commode Contact information: [address] [place] [address] [phone]        Kathryne Hitch, MD Follow up in 2 week(s).   Specialty:  Orthopedic Surgery Contact information: [address] [place] [address] [phone]            Signed: Kathryne Hitch 02/27/2016, 7:27 AM

## 2016-02-27 NOTE — Progress Notes (Signed)
Patient ID: Kathy Duran, female   DOB: 08-May-1946, 70 y.o.   MRN: [id number] Doing very well.  Can be discharged to home today.

## 2016-02-27 NOTE — Progress Notes (Signed)
Physical Therapy Treatment Patient Details Name: Kathy Duran MRN: [id number] DOB: [birth year] Today's Date: 01-Mar-2016    History of Present Illness Pt s/p L THR and with hx of R THR (posterior 2016), back surgery and R RCR    PT Comments    Pt ambulated in hallway and did not feel she needed to practice steps again today however able to verbally recall sequence correctly.  Pt feels ready for d/c home today.  Follow Up Recommendations  Home health PT     Equipment Recommendations  Rolling walker with 5" wheels    Recommendations for Other Services       Precautions / Restrictions Precautions Precautions: Fall Restrictions Other Position/Activity Restrictions: WBAT    Mobility  Bed Mobility Overal bed mobility: Needs Assistance Bed Mobility: Supine to Sit     Supine to sit: Min assist     General bed mobility comments: assist for L LE  Transfers Overall transfer level: Needs assistance Equipment used: Rolling walker (2 wheeled) Transfers: Sit to/from Stand Sit to Stand: Min guard         General transfer comment: verbal cues for UE positioning  Ambulation/Gait Ambulation/Gait assistance: Supervision Ambulation Distance (Feet): 100 Feet Assistive device: Rolling walker (2 wheeled) Gait Pattern/deviations: Step-to pattern;Decreased stance time - left;Antalgic Gait velocity: decr   General Gait Details: verbal cues for posture and hip/knee flexion (pt was keeping L LE stiff)   Stairs            Wheelchair Mobility    Modified Rankin (Stroke Patients Only)       Balance                                    Cognition Arousal/Alertness: Awake/alert Behavior During Therapy: WFL for tasks assessed/performed Overall Cognitive Status: Within Functional Limits for tasks assessed                      Exercises      General Comments        Pertinent Vitals/Pain Pain Assessment: 0-10 Pain Score: 3  Pain Location: L  hip Pain Descriptors / Indicators: Aching;Sore Pain Intervention(s): Repositioned;Premedicated before session;Monitored during session;Limited activity within patient's tolerance;Ice applied    Home Living                      Prior Function            PT Goals (current goals can now be found in the care plan section) Progress towards PT goals: Progressing toward goals    Frequency  7X/week    PT Plan Current plan remains appropriate    Co-evaluation             End of Session   Activity Tolerance: Patient tolerated treatment well Patient left: with call bell/phone within reach;in chair;with family/visitor present     Time: 3338-3291 PT Time Calculation (min) (ACUTE ONLY): 16 min  Charges:  $Gait Training: 8-22 mins                    G Codes:      Dearl Rudden,KATHrine E 01-Mar-2016, 12:44 PM Zenovia Jarred, PT, DPT 03/01/16 Pager: [phone]

## 2016-02-28 DIAGNOSIS — I10 Essential (primary) hypertension: Secondary | ICD-10-CM | POA: Diagnosis not present

## 2016-02-28 DIAGNOSIS — F419 Anxiety disorder, unspecified: Secondary | ICD-10-CM | POA: Diagnosis not present

## 2016-02-28 DIAGNOSIS — J45909 Unspecified asthma, uncomplicated: Secondary | ICD-10-CM | POA: Diagnosis not present

## 2016-02-28 DIAGNOSIS — Z96643 Presence of artificial hip joint, bilateral: Secondary | ICD-10-CM | POA: Diagnosis not present

## 2016-02-28 DIAGNOSIS — Z471 Aftercare following joint replacement surgery: Secondary | ICD-10-CM | POA: Diagnosis not present

## 2016-02-28 DIAGNOSIS — M069 Rheumatoid arthritis, unspecified: Secondary | ICD-10-CM | POA: Diagnosis not present

## 2016-02-29 DIAGNOSIS — Z471 Aftercare following joint replacement surgery: Secondary | ICD-10-CM | POA: Diagnosis not present

## 2016-02-29 DIAGNOSIS — M069 Rheumatoid arthritis, unspecified: Secondary | ICD-10-CM | POA: Diagnosis not present

## 2016-02-29 DIAGNOSIS — I10 Essential (primary) hypertension: Secondary | ICD-10-CM | POA: Diagnosis not present

## 2016-02-29 DIAGNOSIS — Z96643 Presence of artificial hip joint, bilateral: Secondary | ICD-10-CM | POA: Diagnosis not present

## 2016-02-29 DIAGNOSIS — F419 Anxiety disorder, unspecified: Secondary | ICD-10-CM | POA: Diagnosis not present

## 2016-02-29 DIAGNOSIS — J45909 Unspecified asthma, uncomplicated: Secondary | ICD-10-CM | POA: Diagnosis not present

## 2016-03-02 DIAGNOSIS — J45909 Unspecified asthma, uncomplicated: Secondary | ICD-10-CM | POA: Diagnosis not present

## 2016-03-02 DIAGNOSIS — Z96643 Presence of artificial hip joint, bilateral: Secondary | ICD-10-CM | POA: Diagnosis not present

## 2016-03-02 DIAGNOSIS — F419 Anxiety disorder, unspecified: Secondary | ICD-10-CM | POA: Diagnosis not present

## 2016-03-02 DIAGNOSIS — I10 Essential (primary) hypertension: Secondary | ICD-10-CM | POA: Diagnosis not present

## 2016-03-02 DIAGNOSIS — M069 Rheumatoid arthritis, unspecified: Secondary | ICD-10-CM | POA: Diagnosis not present

## 2016-03-02 DIAGNOSIS — Z471 Aftercare following joint replacement surgery: Secondary | ICD-10-CM | POA: Diagnosis not present

## 2016-03-05 DIAGNOSIS — Z471 Aftercare following joint replacement surgery: Secondary | ICD-10-CM | POA: Diagnosis not present

## 2016-03-05 DIAGNOSIS — I10 Essential (primary) hypertension: Secondary | ICD-10-CM | POA: Diagnosis not present

## 2016-03-05 DIAGNOSIS — J45909 Unspecified asthma, uncomplicated: Secondary | ICD-10-CM | POA: Diagnosis not present

## 2016-03-05 DIAGNOSIS — F419 Anxiety disorder, unspecified: Secondary | ICD-10-CM | POA: Diagnosis not present

## 2016-03-05 DIAGNOSIS — Z96643 Presence of artificial hip joint, bilateral: Secondary | ICD-10-CM | POA: Diagnosis not present

## 2016-03-05 DIAGNOSIS — M069 Rheumatoid arthritis, unspecified: Secondary | ICD-10-CM | POA: Diagnosis not present

## 2016-03-07 DIAGNOSIS — Z471 Aftercare following joint replacement surgery: Secondary | ICD-10-CM | POA: Diagnosis not present

## 2016-03-07 DIAGNOSIS — F419 Anxiety disorder, unspecified: Secondary | ICD-10-CM | POA: Diagnosis not present

## 2016-03-07 DIAGNOSIS — Z96643 Presence of artificial hip joint, bilateral: Secondary | ICD-10-CM | POA: Diagnosis not present

## 2016-03-07 DIAGNOSIS — M069 Rheumatoid arthritis, unspecified: Secondary | ICD-10-CM | POA: Diagnosis not present

## 2016-03-07 DIAGNOSIS — J45909 Unspecified asthma, uncomplicated: Secondary | ICD-10-CM | POA: Diagnosis not present

## 2016-03-07 DIAGNOSIS — I10 Essential (primary) hypertension: Secondary | ICD-10-CM | POA: Diagnosis not present

## 2016-03-08 DIAGNOSIS — J45909 Unspecified asthma, uncomplicated: Secondary | ICD-10-CM | POA: Diagnosis not present

## 2016-03-08 DIAGNOSIS — I10 Essential (primary) hypertension: Secondary | ICD-10-CM | POA: Diagnosis not present

## 2016-03-08 DIAGNOSIS — F419 Anxiety disorder, unspecified: Secondary | ICD-10-CM | POA: Diagnosis not present

## 2016-03-08 DIAGNOSIS — M069 Rheumatoid arthritis, unspecified: Secondary | ICD-10-CM | POA: Diagnosis not present

## 2016-03-08 DIAGNOSIS — Z471 Aftercare following joint replacement surgery: Secondary | ICD-10-CM | POA: Diagnosis not present

## 2016-03-08 DIAGNOSIS — Z96643 Presence of artificial hip joint, bilateral: Secondary | ICD-10-CM | POA: Diagnosis not present

## 2016-03-12 DIAGNOSIS — M1612 Unilateral primary osteoarthritis, left hip: Secondary | ICD-10-CM | POA: Diagnosis not present

## 2016-04-23 ENCOUNTER — Ambulatory Visit (INDEPENDENT_AMBULATORY_CARE_PROVIDER_SITE_OTHER): Payer: Medicare PPO | Admitting: Physician Assistant

## 2016-04-23 DIAGNOSIS — M76821 Posterior tibial tendinitis, right leg: Secondary | ICD-10-CM | POA: Diagnosis not present

## 2016-04-24 ENCOUNTER — Ambulatory Visit: Payer: Medicare PPO | Attending: Orthopaedic Surgery | Admitting: Physical Therapy

## 2016-04-24 DIAGNOSIS — N8189 Other female genital prolapse: Secondary | ICD-10-CM | POA: Insufficient documentation

## 2016-04-24 DIAGNOSIS — R600 Localized edema: Secondary | ICD-10-CM | POA: Insufficient documentation

## 2016-04-24 DIAGNOSIS — M25571 Pain in right ankle and joints of right foot: Secondary | ICD-10-CM | POA: Diagnosis not present

## 2016-04-24 DIAGNOSIS — M25671 Stiffness of right ankle, not elsewhere classified: Secondary | ICD-10-CM | POA: Insufficient documentation

## 2016-04-24 DIAGNOSIS — R262 Difficulty in walking, not elsewhere classified: Secondary | ICD-10-CM | POA: Insufficient documentation

## 2016-04-24 NOTE — Therapy (Signed)
[facility] [address] [place], [place], [address] Phone: [phone]   Fax:  [phone]  Physical Therapy Evaluation  Patient Details  Name: Kathy Duran MRN: [id number] Date of Birth: [birth year] Referring Provider: Dr. Doneen Poisson   Encounter Date: 04/24/2016      PT End of Session - 04/24/16 1627    Visit Number 1   Number of Visits 16   Date for PT Re-Evaluation 06/19/16   PT Start Time 1546   PT Stop Time 1635   PT Time Calculation (min) 49 min   Activity Tolerance Patient tolerated treatment well   Behavior During Therapy [facility] for tasks assessed/performed      Past Medical History:  Diagnosis Date  . Anxiety   . Arthritis    left hip  . Asthma    Inhalers for tx  . Bilateral swelling of feet    bilateral ankle and feet edema  . Bruises easily   . Hypertension   . Sinus complaint   . Sjogren's syndrome (HCC)    dry eyes  . Urinary incontinence    occ. an issue wears pads    Past Surgical History:  Procedure Laterality Date  . ABDOMINAL HYSTERECTOMY    . BACK SURGERY    . BREAST SURGERY Right    cyst drainage  . SHOULDER SURGERY Right    right shoulder tendon and rotator cuff repair  . TOTAL HIP ARTHROPLASTY Right 2006  . TOTAL HIP ARTHROPLASTY Left 02/24/2016   Procedure: LEFT TOTAL HIP ARTHROPLASTY ANTERIOR APPROACH;  Surgeon: Kathryne Hitch, MD;  Location: [facility];  Service: Orthopedics;  Laterality: Left;    There were no vitals filed for this visit.       Subjective Assessment - 04/24/16 1554    Subjective Pt presents following recovery for L THR (anterior approach) done 02/2016.  She noticed increased pain in Rt. foot and difficulty walking approx. 10 day ago.  She has notable pain, swelling and difficulty walking.  She has likely overorked her Rt. LE as she recovred from hip surgery.  She had HHPT for L hip. Recently started wearing a boot yesterday and also now walks with a  cane.     Patient is accompained by: Family member   Pertinent History Rt THR, LE edema, RA, HTN   Limitations Walking;Standing;Lifting;House hold activities   How long can you sit comfortably? when foot propped, not limited   How long can you stand comfortably? 30 min maximum, not comfortable    How long can you walk comfortably? 30 min    Diagnostic tests XR in MD office, report not available for PT review    Patient Stated Goals to be able to walk , has a trip to [place] in 2 weeks. She began working with a Psychologist, educational and hopes to get back to that one day soon.    Currently in Pain? Yes   Pain Score 4    Pain Location Ankle   Pain Orientation Right;Medial   Pain Type Acute pain   Pain Onset 1 to 4 weeks ago   Pain Frequency Constant   Aggravating Factors  weightbearing   Pain Relieving Factors ice, just got a CAM boot yesterday   Multiple Pain Sites Yes   Pain Score 3   Pain Location Hip   Pain Orientation Left   Pain Descriptors / Indicators Discomfort   Pain Type Chronic pain   Pain Onset More than a month ago  Pain Frequency Intermittent   Aggravating Factors  putting weight on LLE , walking too much, standing    Pain Relieving Factors rest    Effect of Pain on Daily Activities walking is difficult            [facility] PT Assessment - 04/24/16 1600      Assessment   Medical Diagnosis Rt. post tibilais tendonitis   Referring Provider Dr. Doneen Poisson    Onset Date/Surgical Date --  10 days    Prior Therapy HHPT for L hip     Precautions   Precautions None     Restrictions   Weight Bearing Restrictions No     Balance Screen   Has the patient fallen in the past 6 months No     Home Environment   Living Environment Private residence   Living Arrangements Spouse/significant other   Type of Home House   Home Access Stairs to enter   Entrance Stairs-Number of Steps 2   Entrance Stairs-Rails Right   Additional Comments using the cane now because of the boot  , had not used after hip surgery     Prior Function   Level of Independence Independent   Vocation Retired   Leisure travel     IT consultant   Overall Cognitive Status Within Functional Limits for tasks assessed     Observation/Other Assessments   Focus on Therapeutic Outcomes (FOTO)  70% (knee)     Observation/Other Assessments-Edema    Edema --  chronic      Figure 8 Edema   Figure 8 - Right  21 inch    Figure 8 - Left  21. 50 inch   pt with edema in ankles which has been going on 75yrs     Sensation   Light Touch Appears Intact     Coordination   Gross Motor Movements are Fluid and Coordinated Not tested     Posture/Postural Control   Posture/Postural Control Postural limitations   Postural Limitations Forward head;Increased thoracic kyphosis;Right pelvic obliquity;Flexed trunk;Weight shift left     AROM   Right Ankle Dorsiflexion -5   Right Ankle Plantar Flexion 48   Right Ankle Inversion 24   Right Ankle Eversion 5   Left Ankle Dorsiflexion 0   Left Ankle Plantar Flexion 50   Left Ankle Inversion 35   Left Ankle Eversion 10     Strength   Right Ankle Dorsiflexion 4/5   Right Ankle Plantar Flexion 4/5   Right Ankle Inversion 3/5   Right Ankle Eversion 3+/5     Palpation   Palpation comment medial ankle puffy, painful distal to medial malleolus     Ambulation/Gait   Ambulation Distance (Feet) 150 Feet   Assistive device Straight cane   Gait Pattern Antalgic;Trunk flexed;Abducted- right               PT Education - 04/24/16 2155    Education provided Yes   Education Details PT/POC, HEP for AROM, ice, IFC for pain control    Person(s) Educated Patient   Methods Explanation;Demonstration;Verbal cues;Handout   Comprehension Verbalized understanding;Need further instruction          PT Short Term Goals - 04/24/16 2201      PT SHORT TERM GOAL #1   Title Pt will be I with ankle AROM and strength    Time 4   Status New     PT SHORT TERM GOAL  #2   Title Pt will be I with  ice and self care for pain relief.    Time 4   Period Weeks   Status New     PT SHORT TERM GOAL #3   Title Pt will be able to stand/walk with LRAD and min to no limp, min pain increase in ankle, L hip.    Time 4   Period Weeks   Status New           PT Long Term Goals - 05/13/2016 Nov 25, 2201      PT LONG TERM GOAL #1   Title Pt will be able to walk without boot (as allowed by MD) and cane, no limp for short distances in the community.    Time 8   Status New     PT LONG TERM GOAL #2   Title Pt will demo 4+/5 strength in ankle all planes for maximal stability and control with gait.    Time 8   Period Weeks   Status New     PT LONG TERM GOAL #3   Title Pt will be able to stand on Rt. LE and perform 10 heel raises with UE support no increased pain.    Time 8   Period Weeks   Status New     PT LONG TERM GOAL #4   Title Pt will negotiate >/= 6 steps in the community reciprocally without increasing ankle pain and at least 1 UE support.    Time 8   Period Weeks   Status New     PT LONG TERM GOAL #5   Title Pt will be I with HEP for standing LE strength, ROM    Time 8   Period Weeks   Status New               Plan - 05/13/2016 11/26/2154    Clinical Impression Statement This patient presents with mod complexity eval of apparent Rt post tibialis tendinitis.  She has decreased ankle ROM, swelling and weakness which has significantly impacted her mobility and ability to maintain her independence.     Rehab Potential Good   PT Frequency 2x / week   PT Duration 8 weeks   PT Treatment/Interventions ADLs/Self Care Home Management;Ultrasound;Therapeutic exercise;Therapeutic activities;Balance training;Neuromuscular re-education;Gait training;Cryotherapy;Electrical Stimulation;Stair training;Functional mobility training;Passive range of motion;Patient/family education;Taping;Vasopneumatic Device;Manual techniques   PT Next Visit Plan check HEP, begin gentle  theraband ex for ankle.  PT please test hip and knee MMT.  Vaso?    PT Home Exercise Plan ankle AROM    Consulted and Agree with Plan of Care Patient      Patient will benefit from skilled therapeutic intervention in order to improve the following deficits and impairments:  Abnormal gait, Decreased range of motion, Difficulty walking, Increased fascial restricitons, Obesity, Decreased activity tolerance, Pain, Decreased balance, Decreased mobility, Decreased strength, Increased edema, Postural dysfunction, Impaired flexibility  Visit Diagnosis: Localized edema  Stiffness of right ankle, not elsewhere classified  Pain in right ankle and joints of right foot  Difficulty in walking, not elsewhere classified      G-Codes - 05-13-16 [id number]    Functional Assessment Tool Used FOTO   Functional Limitation Mobility: Walking and moving around   Mobility: Walking and Moving Around Current Status [phone]) At least 60 percent but less than 80 percent impaired, limited or restricted   Mobility: Walking and Moving Around Goal Status [phone]) At least 40 percent but less than 60 percent impaired, limited or restricted       Problem List Patient  Active Problem List   Diagnosis Date Noted  . Osteoarthritis of left hip 02/24/2016  . Status post left hip replacement 02/24/2016  . Varicose veins 04/20/2015  . Lipidemia 04/20/2015  . Rheumatoid arthritis (HCC) 11/20/2013  . Low back pain 11/20/2013  . HTN (hypertension) 11/20/2013  . GERD (gastroesophageal reflux disease) 11/20/2013  . Bladder spasm 11/20/2013  . Hyperlipidemia 11/20/2013  . Status post right hip replacement 11/20/2013  . Bilateral swelling of feet   . Bruises easily     Kathy Duran 04/24/2016, 10:14 PM  [facility] [address] [place], [place], [address] Phone: [phone]   Fax:  [phone]  Name: Kathy Duran MRN: [id number] Date of Birth: 1946-01-06   Karie Mainland, PT 04/24/16 10:15 PM Phone: [phone] Fax: [phone]

## 2016-04-24 NOTE — Patient Instructions (Signed)
Gastroc / Heel Cord Stretch - Seated With Towel   Sit on floor, towel around ball of foot. Gently pull foot in toward body, stretching heel cord and calf. Hold for __30_ seconds. Repeat on involved leg. Repeat __3_ times. Do _2-3__ times per day.  Copyright  VHI. All rights reserved.  Sitting or lying with feet on floor, keep knee still and rock foot onto outer edge. Return to resting position. Now rock foot onto inner edge. Repeat with other foot, then with both feet. Repeat _10___ times. Do __2__ sessions per day.  http://[email or web address].exer.us/405   Copyright  VHI. All rights reserved.  ROM: Inversion / Eversion   With left leg relaxed, gently turn ankle and foot in and out. Move through full range of motion. Avoid pain. Repeat __10-20_ times per set. Do __1_ sets per session. Do _2___ sessions per day.  http://orth.exer.us/36   Copyright  VHI. All rights reserved.  ROM: Plantar / Dorsiflexion   With left leg relaxed, gently flex and extend ankle. Move through full range of motion. Avoid pain. Repeat ___10-20_ times per set. Do __1__ sets per session. Do ___2_ sessions per day.  http://orth.exer.us/34   Copyright  VHI. All rights reserved.  Ankle Alphabet   Using left ankle and foot only, trace the letters of the alphabet. Perform A to Z. Repeat _1-2___ times per set. Do _1__ sets per session. Do __2__ sessions per day.  http://orth.exer.us/16   Copyright  VHI. All rights reserved.  Ankle Circles   Slowly rotate right foot and ankle clockwise then counterclockwise. Gradually increase range of motion. Avoid pain. Circle __10__ times each direction per set. Do ___1_ sets per session. Do __2__ sessions per day.  http://orth.exer.us/30   Copyright  VHI. All rights reserved.

## 2016-04-25 ENCOUNTER — Ambulatory Visit: Payer: Medicare PPO | Admitting: Physical Therapy

## 2016-04-25 DIAGNOSIS — M25671 Stiffness of right ankle, not elsewhere classified: Secondary | ICD-10-CM

## 2016-04-25 DIAGNOSIS — N8189 Other female genital prolapse: Secondary | ICD-10-CM

## 2016-04-25 DIAGNOSIS — R262 Difficulty in walking, not elsewhere classified: Secondary | ICD-10-CM

## 2016-04-25 DIAGNOSIS — R600 Localized edema: Secondary | ICD-10-CM | POA: Diagnosis not present

## 2016-04-25 DIAGNOSIS — M25571 Pain in right ankle and joints of right foot: Secondary | ICD-10-CM | POA: Diagnosis not present

## 2016-04-26 NOTE — Therapy (Signed)
[facility] Outpatient Rehabilitation [facility] [address] [place], [place], [address] Phone: [phone]   Fax:  [phone]  Physical Therapy Treatment  Patient Details  Name: Kathy Duran MRN: [id number] Date of Birth: 23-Jun-1946 Referring Provider: Dr. Doneen Poisson   Encounter Date: 04/25/2016      PT End of Session - 04/25/16 1019    Visit Number 2   Number of Visits 16   Date for PT Re-Evaluation 06/19/16   PT Start Time 1017   PT Stop Time 1115   PT Time Calculation (min) 58 min      Past Medical History:  Diagnosis Date  . Anxiety   . Arthritis    left hip  . Asthma    Inhalers for tx  . Bilateral swelling of feet    bilateral ankle and feet edema  . Bruises easily   . Hypertension   . Sinus complaint   . Sjogren's syndrome (HCC)    dry eyes  . Urinary incontinence    occ. an issue wears pads    Past Surgical History:  Procedure Laterality Date  . ABDOMINAL HYSTERECTOMY    . BACK SURGERY    . BREAST SURGERY Right    cyst drainage  . SHOULDER SURGERY Right    right shoulder tendon and rotator cuff repair  . TOTAL HIP ARTHROPLASTY Right 2006  . TOTAL HIP ARTHROPLASTY Left 02/24/2016   Procedure: LEFT TOTAL HIP ARTHROPLASTY ANTERIOR APPROACH;  Surgeon: Kathryne Hitch, MD;  Location: [facility];  Service: Orthopedics;  Laterality: Left;    There were no vitals filed for this visit.      Subjective Assessment - 04/25/16 1018    Subjective I just got the exercises yesterday. I have not really started them yet.    Currently in Pain? Yes   Pain Score 2    Aggravating Factors  weightbearing                          [facility] Adult PT Treatment/Exercise - 04/26/16 0001      Modalities   Modalities Ultrasound;Vasopneumatic     Ultrasound   Ultrasound Location Medial right ankle    Ultrasound Parameters 1 MHZ, pulsed, .5 w/cm2 x 8 minutes   Ultrasound Goals Pain     Vasopneumatic   Number Minutes  Vasopneumatic  15 minutes   Vasopnuematic Location  Ankle   Vasopneumatic Pressure Medium   Vasopneumatic Temperature  32     Manual Therapy   Manual Therapy Joint mobilization   Joint Mobilization gentle grade 2 AP mobs and calcaneal mobs to decrease pain follwed by gentle PROM all planes, good Passive DF      Ankle Exercises: Supine   T-Band yellow band x 10 each   Other Supine Ankle Exercises supine ankle AROM x10 each      Ankle Exercises: Stretches   Gastroc Stretch Limitations seated with towel     Ankle Exercises: Seated   Towel Inversion/Eversion 5 reps   Towel Inversion/Eversion Weights (lbs) painful                  PT Short Term Goals - 04/24/16 2201      PT SHORT TERM GOAL #1   Title Pt will be I with ankle AROM and strength    Time 4   Status New     PT SHORT TERM GOAL #2   Title Pt will be I with ice and  self care for pain relief.    Time 4   Period Weeks   Status New     PT SHORT TERM GOAL #3   Title Pt will be able to stand/walk with LRAD and min to no limp, min pain increase in ankle, L hip.    Time 4   Period Weeks   Status New           PT Long Term Goals - 04/24/16 2203      PT LONG TERM GOAL #1   Title Pt will be able to walk without boot (as allowed by MD) and cane, no limp for short distances in the community.    Time 8   Status New     PT LONG TERM GOAL #2   Title Pt will demo 4+/5 strength in ankle all planes for maximal stability and control with gait.    Time 8   Period Weeks   Status New     PT LONG TERM GOAL #3   Title Pt will be able to stand on Rt. LE and perform 10 heel raises with UE support no increased pain.    Time 8   Period Weeks   Status New     PT LONG TERM GOAL #4   Title Pt will negotiate >/= 6 steps in the community reciprocally without increasing ankle pain and at least 1 UE support.    Time 8   Period Weeks   Status New     PT LONG TERM GOAL #5   Title Pt will be I with HEP for standing LE  strength, ROM    Time 8   Period Weeks   Status New               Plan - 04/26/16 0917    Clinical Impression Statement Pt enters without CAM boot and c/o pain with ambulation medial ankle. We reviewed her HEP. Attempted gentle yellow band exercsies however did not add to HEP due to pain. Asked her to remain in CAM boot and bring her sneaker to PT. Discussed the need for painfree ambulation and therex to allow area to heal. Pt verbalized understanding. Trial of Ultrasound to medial ankle with pt reporting decreased tenderness after. Vaso for edema. Will assess response next visit.    PT Next Visit Plan  PT please test hip and knee MMT.  Assess benefit of [place] and Vaso; open chain, pain free ankle strength. isometrics?   PT Home Exercise Plan ankle AROM       Patient will benefit from skilled therapeutic intervention in order to improve the following deficits and impairments:  Abnormal gait, Decreased range of motion, Difficulty walking, Increased fascial restricitons, Obesity, Decreased activity tolerance, Pain, Decreased balance, Decreased mobility, Decreased strength, Increased edema, Postural dysfunction, Impaired flexibility  Visit Diagnosis: Localized edema  Stiffness of right ankle, not elsewhere classified  Pain in right ankle and joints of right foot  Difficulty in walking, not elsewhere classified  Pelvic floor weakness in female     Problem List Patient Active Problem List   Diagnosis Date Noted  . Osteoarthritis of left hip 02/24/2016  . Status post left hip replacement 02/24/2016  . Varicose veins 04/20/2015  . Lipidemia 04/20/2015  . Rheumatoid arthritis (HCC) 11/20/2013  . Low back pain 11/20/2013  . HTN (hypertension) 11/20/2013  . GERD (gastroesophageal reflux disease) 11/20/2013  . Bladder spasm 11/20/2013  . Hyperlipidemia 11/20/2013  . Status post right hip replacement 11/20/2013  .  Bilateral swelling of feet   . Bruises easily     Sherrie Mustache, PTA 04/26/2016, 9:31 AM  [facility] [address] [place], [place], [address] Phone: [phone]   Fax:  [phone]  Name: Kathy Duran MRN: [id number] Date of Birth: 1945-08-31

## 2016-04-30 DIAGNOSIS — M79671 Pain in right foot: Secondary | ICD-10-CM | POA: Diagnosis not present

## 2016-04-30 DIAGNOSIS — F341 Dysthymic disorder: Secondary | ICD-10-CM | POA: Diagnosis not present

## 2016-04-30 DIAGNOSIS — G4700 Insomnia, unspecified: Secondary | ICD-10-CM | POA: Diagnosis not present

## 2016-05-01 ENCOUNTER — Ambulatory Visit: Payer: Medicare PPO | Admitting: Physical Therapy

## 2016-05-01 DIAGNOSIS — M25571 Pain in right ankle and joints of right foot: Secondary | ICD-10-CM

## 2016-05-01 DIAGNOSIS — R262 Difficulty in walking, not elsewhere classified: Secondary | ICD-10-CM | POA: Diagnosis not present

## 2016-05-01 DIAGNOSIS — R600 Localized edema: Secondary | ICD-10-CM

## 2016-05-01 DIAGNOSIS — M25671 Stiffness of right ankle, not elsewhere classified: Secondary | ICD-10-CM | POA: Diagnosis not present

## 2016-05-01 DIAGNOSIS — N8189 Other female genital prolapse: Secondary | ICD-10-CM | POA: Diagnosis not present

## 2016-05-01 NOTE — Therapy (Signed)
[facility] Outpatient Rehabilitation [facility] [address] [place], [place], [address] Phone: [phone]   Fax:  [phone]  Physical Therapy Treatment  Patient Details  Name: Kathy Duran MRN: [id number] Date of Birth: [birth year] Referring Provider: Dr. Doneen Poisson   Encounter Date: 05/01/2016      PT End of Session - 05/01/16 1548    Visit Number 3   Number of Visits 16   Date for PT Re-Evaluation 06/19/16   PT Start Time 0300   PT Stop Time 0400   PT Time Calculation (min) 60 min      Past Medical History:  Diagnosis Date  . Anxiety   . Arthritis    left hip  . Asthma    Inhalers for tx  . Bilateral swelling of feet    bilateral ankle and feet edema  . Bruises easily   . Hypertension   . Sinus complaint   . Sjogren's syndrome (HCC)    dry eyes  . Urinary incontinence    occ. an issue wears pads    Past Surgical History:  Procedure Laterality Date  . ABDOMINAL HYSTERECTOMY    . BACK SURGERY    . BREAST SURGERY Right    cyst drainage  . SHOULDER SURGERY Right    right shoulder tendon and rotator cuff repair  . TOTAL HIP ARTHROPLASTY Right 2006  . TOTAL HIP ARTHROPLASTY Left 02/24/2016   Procedure: LEFT TOTAL HIP ARTHROPLASTY ANTERIOR APPROACH;  Surgeon: Kathryne Hitch, MD;  Location: [facility];  Service: Orthopedics;  Laterality: Left;    There were no vitals filed for this visit.      Subjective Assessment - 05/01/16 1505    Subjective I have been icing it twice a day and doing my exercises. My MD gave me a low dose an arthritis medication to help with medication, maybe Meloxicam?   Currently in Pain? Yes   Pain Score 4    Pain Location Ankle   Pain Orientation Right;Medial   Pain Score 0   Pain Location Hip                         [facility] Adult PT Treatment/Exercise - 05/01/16 0001      Ultrasound   Ultrasound Location medial right ankle   Ultrasound Parameters 1 MHZ pulsed .5w/cm/2 x 8  minutes   Ultrasound Goals Pain     Vasopneumatic   Number Minutes Vasopneumatic  15 minutes   Vasopnuematic Location  Ankle   Vasopneumatic Pressure Medium   Vasopneumatic Temperature  32     Ankle Exercises: Stretches   Gastroc Stretch Limitations seated with towel     Ankle Exercises: Seated   Towel Crunch 5 reps   Towel Inversion/Eversion 5 reps   Heel Raises 10 reps   Toe Raise 10 reps   BAPS Level 2     Ankle Exercises: Supine   T-Band yellow band x 10 each, performed seated with instructions for HEP                PT Education - 05/01/16 1549    Education provided Yes   Education Details Ankle 4 way, inversion isometrics   Person(s) Educated Patient   Methods Explanation;Handout   Comprehension Verbalized understanding          PT Short Term Goals - 04/24/16 2201      PT SHORT TERM GOAL #1   Title Pt will be I with ankle AROM  and strength    Time 4   Status New     PT SHORT TERM GOAL #2   Title Pt will be I with ice and self care for pain relief.    Time 4   Period Weeks   Status New     PT SHORT TERM GOAL #3   Title Pt will be able to stand/walk with LRAD and min to no limp, min pain increase in ankle, L hip.    Time 4   Period Weeks   Status New           PT Long Term Goals - 04/24/16 2203      PT LONG TERM GOAL #1   Title Pt will be able to walk without boot (as allowed by MD) and cane, no limp for short distances in the community.    Time 8   Status New     PT LONG TERM GOAL #2   Title Pt will demo 4+/5 strength in ankle all planes for maximal stability and control with gait.    Time 8   Period Weeks   Status New     PT LONG TERM GOAL #3   Title Pt will be able to stand on Rt. LE and perform 10 heel raises with UE support no increased pain.    Time 8   Period Weeks   Status New     PT LONG TERM GOAL #4   Title Pt will negotiate >/= 6 steps in the community reciprocally without increasing ankle pain and at least 1 UE  support.    Time 8   Period Weeks   Status New     PT LONG TERM GOAL #5   Title Pt will be I with HEP for standing LE strength, ROM    Time 8   Period Weeks   Status New               Plan - 05/01/16 1606    Clinical Impression Statement Pt reports ultrasound and vaso very helpful last visit. Her swelling the next morning was completely gone. She reports decreased intensity of pain at night during bathroom trips without donning her cam boot. She reports the cam boot reduces her pain intensity however she still rates her pain as 4/10. Inversion continues to be most painful ankle AROM however improved. No pain with inversion isometrics and mild pain with yellow band today.    PT Next Visit Plan  PT please test hip and knee MMT.  Continue [place] and Vaso if helpful; review 4 way ankle and inversion isometrics: ask for ionto order?       Patient will benefit from skilled therapeutic intervention in order to improve the following deficits and impairments:  Abnormal gait, Decreased range of motion, Difficulty walking, Increased fascial restricitons, Obesity, Decreased activity tolerance, Pain, Decreased balance, Decreased mobility, Decreased strength, Increased edema, Postural dysfunction, Impaired flexibility  Visit Diagnosis: Localized edema  Stiffness of right ankle, not elsewhere classified  Pain in right ankle and joints of right foot  Difficulty in walking, not elsewhere classified     Problem List Patient Active Problem List   Diagnosis Date Noted  . Osteoarthritis of left hip 02/24/2016  . Status post left hip replacement 02/24/2016  . Varicose veins 04/20/2015  . Lipidemia 04/20/2015  . Rheumatoid arthritis (HCC) 11/20/2013  . Low back pain 11/20/2013  . HTN (hypertension) 11/20/2013  . GERD (gastroesophageal reflux disease) 11/20/2013  . Bladder spasm 11/20/2013  .  Hyperlipidemia 11/20/2013  . Status post right hip replacement 11/20/2013  . Bilateral swelling of  feet   . Bruises easily     Sherrie Mustache, PTA 05/01/2016, 4:13 PM  [facility] [address] [place], [place], [address] Phone: [phone]   Fax:  [phone]  Name: Kathy Duran MRN: [id number] Date of Birth: July 10, 1946

## 2016-05-03 ENCOUNTER — Ambulatory Visit (INDEPENDENT_AMBULATORY_CARE_PROVIDER_SITE_OTHER): Payer: Medicare PPO | Admitting: Orthopaedic Surgery

## 2016-05-03 DIAGNOSIS — M7072 Other bursitis of hip, left hip: Secondary | ICD-10-CM

## 2016-05-03 DIAGNOSIS — M1612 Unilateral primary osteoarthritis, left hip: Secondary | ICD-10-CM

## 2016-05-04 ENCOUNTER — Ambulatory Visit: Payer: Medicare PPO | Admitting: Physical Therapy

## 2016-05-04 DIAGNOSIS — N8189 Other female genital prolapse: Secondary | ICD-10-CM | POA: Diagnosis not present

## 2016-05-04 DIAGNOSIS — R262 Difficulty in walking, not elsewhere classified: Secondary | ICD-10-CM | POA: Diagnosis not present

## 2016-05-04 DIAGNOSIS — M25671 Stiffness of right ankle, not elsewhere classified: Secondary | ICD-10-CM | POA: Diagnosis not present

## 2016-05-04 DIAGNOSIS — M25571 Pain in right ankle and joints of right foot: Secondary | ICD-10-CM

## 2016-05-04 DIAGNOSIS — R600 Localized edema: Secondary | ICD-10-CM

## 2016-05-04 NOTE — Therapy (Signed)
[place], [place], [address] Phone: [phone]   Fax:  [phone]  Physical Therapy Treatment  Patient Details  Name: Kathy Duran MRN: [id number] Date of Birth: April 21, 1946 Referring Provider: Dr. Jean Rosenthal   Encounter Date: 05/04/2016      PT End of Session - 05/04/16 1158    Visit Number 4   Number of Visits 16   Date for PT Re-Evaluation 06/19/16   PT Start Time 1102   PT Stop Time 1200   PT Time Calculation (min) 58 min      Past Medical History:  Diagnosis Date  . Anxiety   . Arthritis    left hip  . Asthma    Inhalers for tx  . Bilateral swelling of feet    bilateral ankle and feet edema  . Bruises easily   . Hypertension   . Sinus complaint   . Sjogren's syndrome ([place])    dry eyes  . Urinary incontinence    occ. an issue wears pads    Past Surgical History:  Procedure Laterality Date  . ABDOMINAL HYSTERECTOMY    . BACK SURGERY    . BREAST SURGERY Right    cyst drainage  . SHOULDER SURGERY Right    right shoulder tendon and rotator cuff repair  . TOTAL HIP ARTHROPLASTY Right 2006  . TOTAL HIP ARTHROPLASTY Left 02/24/2016   Procedure: LEFT TOTAL HIP ARTHROPLASTY ANTERIOR APPROACH;  Surgeon: Mcarthur Rossetti, MD;  Location: [facility];  Service: Orthopedics;  Laterality: Left;    There were no vitals filed for this visit.      Subjective Assessment - 05/04/16 1104    Subjective I saw Dr Ninfa Linden yesterday. He said not to get excited about coming out of the boot yet.    Currently in Pain? Yes   Pain Score 2    Pain Location Ankle   Pain Orientation Right;Medial   Pain Descriptors / Indicators Aching;Dull   Aggravating Factors  weightbearing,    Pain Relieving Factors ice, cam boot   Pain Score 2   Pain Location Hip   Pain Orientation Left   Pain Descriptors / Indicators Discomfort   Aggravating Factors  weight, too much walking    Pain Relieving  Factors rest, hip exercises            [facility] PT Assessment - 05/04/16 0001      AROM   Right Ankle Dorsiflexion 5   Right Ankle Plantar Flexion 48   Right Ankle Inversion 15  measured supine   Right Ankle Eversion 30  measured supine                     [facility] Adult PT Treatment/Exercise - 05/04/16 0001      Ultrasound   Ultrasound Location medial right ankle   Ultrasound Parameters 1Mhz 20% .5w/cm2    Ultrasound Goals Pain     Vasopneumatic   Number Minutes Vasopneumatic  15 minutes   Vasopnuematic Location  Ankle   Vasopneumatic Pressure Medium   Vasopneumatic Temperature  32     Ankle Exercises: Supine   T-Band yellow band 2 x 10 each, performed seated with instructions for HEP     Ankle Exercises: Seated   Towel Inversion/Eversion --  x 20   Heel Raises 20 reps   Toe Raise 20 reps   BAPS Level 2  Df/Pf, Inv/ev, circles     Ankle Exercises: Stretches  Gastroc Stretch Limitations seated with towel  3 x 30 sec                   PT Short Term Goals - 05/04/16 1158      PT SHORT TERM GOAL #1   Title Pt will be I with ankle AROM and strength    Time 4   Period Weeks   Status Achieved     PT SHORT TERM GOAL #2   Title Pt will be I with ice and self care for pain relief.    Time 4   Period Weeks   Status Achieved           PT Long Term Goals - 04/24/16 2203      PT LONG TERM GOAL #1   Title Pt will be able to walk without boot (as allowed by MD) and cane, no limp for short distances in the community.    Time 8   Status New     PT LONG TERM GOAL #2   Title Pt will demo 4+/5 strength in ankle all planes for maximal stability and control with gait.    Time 8   Period Weeks   Status New     PT LONG TERM GOAL #3   Title Pt will be able to stand on Rt. LE and perform 10 heel raises with UE support no increased pain.    Time 8   Period Weeks   Status New     PT LONG TERM GOAL #4   Title Pt will negotiate >/= 6 steps in  the community reciprocally without increasing ankle pain and at least 1 UE support.    Time 8   Period Weeks   Status New     PT LONG TERM GOAL #5   Title Pt will be I with HEP for standing LE strength, ROM    Time 8   Period Weeks   Status New               Plan - 05/04/16 1156    Clinical Impression Statement Painful DF and Ivnversion motions. Pain much better overall. Taking Cam boot on trip to [place] soon. AROM improved. STG# 1, #2 Met.    PT Next Visit Plan  PT please test hip and knee MMT.  Continue [place] and Vaso if helpful; review 4 way ankle and inversion isometrics: ask for ionto order?       Patient will benefit from skilled therapeutic intervention in order to improve the following deficits and impairments:  Abnormal gait, Decreased range of motion, Difficulty walking, Increased fascial restricitons, Obesity, Decreased activity tolerance, Pain, Decreased balance, Decreased mobility, Decreased strength, Increased edema, Postural dysfunction, Impaired flexibility  Visit Diagnosis: Localized edema  Stiffness of right ankle, not elsewhere classified  Pain in right ankle and joints of right foot  Difficulty in walking, not elsewhere classified     Problem List Patient Active Problem List   Diagnosis Date Noted  . Osteoarthritis of left hip 02/24/2016  . Status post left hip replacement 02/24/2016  . Varicose veins 04/20/2015  . Lipidemia 04/20/2015  . Rheumatoid arthritis ([place]) 11/20/2013  . Low back pain 11/20/2013  . HTN (hypertension) 11/20/2013  . GERD (gastroesophageal reflux disease) 11/20/2013  . Bladder spasm 11/20/2013  . Hyperlipidemia 11/20/2013  . Status post right hip replacement 11/20/2013  . Bilateral swelling of feet   . Bruises easily     Dorene Ar, PTA 05/04/2016, 11:59 AM  [place], [place], [address] Phone: [phone]   Fax:   [phone]  Name: Kathy Duran MRN: [id number] Date of Birth: [birth year]

## 2016-05-15 ENCOUNTER — Ambulatory Visit: Payer: Medicare PPO | Admitting: Physical Therapy

## 2016-05-15 DIAGNOSIS — R262 Difficulty in walking, not elsewhere classified: Secondary | ICD-10-CM | POA: Diagnosis not present

## 2016-05-15 DIAGNOSIS — M25671 Stiffness of right ankle, not elsewhere classified: Secondary | ICD-10-CM

## 2016-05-15 DIAGNOSIS — N8189 Other female genital prolapse: Secondary | ICD-10-CM | POA: Diagnosis not present

## 2016-05-15 DIAGNOSIS — R600 Localized edema: Secondary | ICD-10-CM

## 2016-05-15 DIAGNOSIS — M25571 Pain in right ankle and joints of right foot: Secondary | ICD-10-CM

## 2016-05-15 NOTE — Therapy (Signed)
[facility] Outpatient Rehabilitation [facility] [address] [place], [place], [address] Phone: [phone]   Fax:  [phone]  Physical Therapy Treatment  Patient Details  Name: Kathy Duran MRN: [id number] Date of Birth: Nov 09, 1945 Referring Provider: Dr. Doneen Poisson   Encounter Date: 05/15/2016      PT End of Session - 05/15/16 1635    Visit Number 5   Number of Visits 16   Date for PT Re-Evaluation 06/19/16   PT Start Time 1545   PT Stop Time 1640   PT Time Calculation (min) 55 min   Activity Tolerance Patient tolerated treatment well   Behavior During Therapy [facility] for tasks assessed/performed      Past Medical History:  Diagnosis Date  . Anxiety   . Arthritis    left hip  . Asthma    Inhalers for tx  . Bilateral swelling of feet    bilateral ankle and feet edema  . Bruises easily   . Hypertension   . Sinus complaint   . Sjogren's syndrome (HCC)    dry eyes  . Urinary incontinence    occ. an issue wears pads    Past Surgical History:  Procedure Laterality Date  . ABDOMINAL HYSTERECTOMY    . BACK SURGERY    . BREAST SURGERY Right    cyst drainage  . SHOULDER SURGERY Right    right shoulder tendon and rotator cuff repair  . TOTAL HIP ARTHROPLASTY Right 2006  . TOTAL HIP ARTHROPLASTY Left 02/24/2016   Procedure: LEFT TOTAL HIP ARTHROPLASTY ANTERIOR APPROACH;  Surgeon: Kathryne Hitch, MD;  Location: [facility];  Service: Orthopedics;  Laterality: Left;    There were no vitals filed for this visit.      Subjective Assessment - 05/15/16 1554    Subjective Did well in [place], was able to do my exercises on non travel days. My daughter made me do my exercises.  Used a wheelchair to shop but walked in each store in the mall    Currently in Pain? Yes   Pain Score 2    Pain Location Ankle   Pain Orientation Right   Pain Descriptors / Indicators Aching   Pain Type Acute pain   Pain Onset More than a month ago   Pain Frequency  Intermittent   Aggravating Factors  weightbearing, although not as painful as it was    Pain Relieving Factors ice , BOOT , first thing in the AM>    Multiple Pain Sites No                         [facility] Adult PT Treatment/Exercise - 05/15/16 1605      Modalities   Modalities Iontophoresis     Cryotherapy   Number Minutes Cryotherapy 6 Minutes   Cryotherapy Location Ankle   Type of Cryotherapy Ice pack     Ultrasound   Ultrasound Location medial Rt ankle    Ultrasound Parameters 1 MHz, 50% and 1.2 W/cm2   Ultrasound Goals Pain     Iontophoresis   Type of Iontophoresis Dexamethasone   Location post tib tendon R   Dose 1cc    Time 6 hr      Manual Therapy   Manual Therapy Edema management;Taping   Manual therapy comments 2 fans for LE edema    Edema Management retro massage with elevation      Ankle Exercises: Seated   Towel Crunch 5 reps   Towel  Inversion/Eversion 5 reps   Heel Raises 20 reps   Toe Raise 20 reps     Ankle Exercises: Stretches   Gastroc Stretch Limitations seated with towel  3 x 30 sec      Ankle Exercises: Aerobic   Stationary Bike NuStep L 4 , 7 min UE and LE                 PT Education - 05/15/16 2144    Education provided Yes   Education Details tape, ionto, swelling   Person(s) Educated Patient   Methods Explanation;Demonstration   Comprehension Verbalized understanding;Returned demonstration          PT Short Term Goals - 05/15/16 2146      PT SHORT TERM GOAL #1   Title Pt will be I with ankle AROM and strength    Status Achieved     PT SHORT TERM GOAL #2   Title Pt will be I with ice and self care for pain relief.    Status Achieved     PT SHORT TERM GOAL #3   Title Pt will be able to stand/walk with LRAD and min to no limp, min pain increase in ankle, L hip.    Status On-going           PT Long Term Goals - 05/15/16 2146      PT LONG TERM GOAL #1   Title Pt will be able to walk without boot  (as allowed by MD) and cane, no limp for short distances in the community.    Status Unable to assess     PT LONG TERM GOAL #2   Title Pt will demo 4+/5 strength in ankle all planes for maximal stability and control with gait.    Status On-going     PT LONG TERM GOAL #3   Title Pt will be able to stand on Rt. LE and perform 10 heel raises with UE support no increased pain.    Status On-going     PT LONG TERM GOAL #4   Title Pt will negotiate >/= 6 steps in the community reciprocally without increasing ankle pain and at least 1 UE support.    Status On-going     PT LONG TERM GOAL #5   Title Pt will be I with HEP for standing LE strength, ROM    Status On-going               Plan - 05/15/16 2144    Clinical Impression Statement Worked on swelling today, trial of ionto and tape for edema.  Cont to have pain as the day goes on but she is consistently using boot and HEP.    PT Next Visit Plan  PT please test hip and knee MMT.  Continue [place] and Vaso if helpful; review 4 way ankle and inversion isometrics: assess tape and ionto    PT Home Exercise Plan ankle AROM , red T band 4 way   Consulted and Agree with Plan of Care Patient      Patient will benefit from skilled therapeutic intervention in order to improve the following deficits and impairments:  Abnormal gait, Decreased range of motion, Difficulty walking, Increased fascial restricitons, Obesity, Decreased activity tolerance, Pain, Decreased balance, Decreased mobility, Decreased strength, Increased edema, Postural dysfunction, Impaired flexibility  Visit Diagnosis: Localized edema  Stiffness of right ankle, not elsewhere classified  Pain in right ankle and joints of right foot  Difficulty in walking, not elsewhere classified  Problem List Patient Active Problem List   Diagnosis Date Noted  . Osteoarthritis of left hip 02/24/2016  . Status post left hip replacement 02/24/2016  . Varicose veins 04/20/2015  .  Lipidemia 04/20/2015  . Rheumatoid arthritis (HCC) 11/20/2013  . Low back pain 11/20/2013  . HTN (hypertension) 11/20/2013  . GERD (gastroesophageal reflux disease) 11/20/2013  . Bladder spasm 11/20/2013  . Hyperlipidemia 11/20/2013  . Status post right hip replacement 11/20/2013  . Bilateral swelling of feet   . Bruises easily     Kathy Duran 05/15/2016, 9:50 PM  [facility] [address] [place], [place], [address] Phone: [phone]   Fax:  [phone]  Name: Kathy Duran MRN: [id number] Date of Birth: Feb 02, 1946  Karie Mainland, PT 05/15/16 9:51 PM Phone: [phone] Fax: [phone]

## 2016-05-17 ENCOUNTER — Ambulatory Visit: Payer: Medicare PPO | Admitting: Physical Therapy

## 2016-05-17 DIAGNOSIS — R262 Difficulty in walking, not elsewhere classified: Secondary | ICD-10-CM

## 2016-05-17 DIAGNOSIS — R600 Localized edema: Secondary | ICD-10-CM | POA: Diagnosis not present

## 2016-05-17 DIAGNOSIS — M25571 Pain in right ankle and joints of right foot: Secondary | ICD-10-CM | POA: Diagnosis not present

## 2016-05-17 DIAGNOSIS — M25671 Stiffness of right ankle, not elsewhere classified: Secondary | ICD-10-CM | POA: Diagnosis not present

## 2016-05-17 DIAGNOSIS — N8189 Other female genital prolapse: Secondary | ICD-10-CM | POA: Diagnosis not present

## 2016-05-17 NOTE — Therapy (Signed)
[facility] Outpatient Rehabilitation [facility] [address] [place], [place], [address] Phone: [phone]   Fax:  [phone]  Physical Therapy Treatment  Patient Details  Name: Kathy Duran MRN: [id number] Date of Birth: July 16, 1946 Referring Provider: Dr. Doneen Poisson   Encounter Date: 05/17/2016      PT End of Session - 05/17/16 1549    Visit Number 6   Number of Visits 16   Date for PT Re-Evaluation 06/19/16   PT Start Time 0345   PT Stop Time 0428   PT Time Calculation (min) 43 min      Past Medical History:  Diagnosis Date  . Anxiety   . Arthritis    left hip  . Asthma    Inhalers for tx  . Bilateral swelling of feet    bilateral ankle and feet edema  . Bruises easily   . Hypertension   . Sinus complaint   . Sjogren's syndrome (HCC)    dry eyes  . Urinary incontinence    occ. an issue wears pads    Past Surgical History:  Procedure Laterality Date  . ABDOMINAL HYSTERECTOMY    . BACK SURGERY    . BREAST SURGERY Right    cyst drainage  . SHOULDER SURGERY Right    right shoulder tendon and rotator cuff repair  . TOTAL HIP ARTHROPLASTY Right 2006  . TOTAL HIP ARTHROPLASTY Left 02/24/2016   Procedure: LEFT TOTAL HIP ARTHROPLASTY ANTERIOR APPROACH;  Surgeon: Kathryne Hitch, MD;  Location: [facility];  Service: Orthopedics;  Laterality: Left;    There were no vitals filed for this visit.      Subjective Assessment - 05/17/16 1547    Subjective Increased pain over last couple of days. Maybe the weather change.    Currently in Pain? No/denies   Aggravating Factors  cold weather                         [facility] Adult PT Treatment/Exercise - 05/17/16 0001      Ultrasound   Ultrasound Location medial rt ankle pulsed, 1 mhz .8 w/cm2   Ultrasound Goals Pain     Manual Therapy   Manual therapy comments IASTM calf, medial lower leg, medial ankle- very tender      Ankle Exercises: Seated   Other Seated Ankle  Exercises AROM all planes     Ankle Exercises: Stretches   Slant Board Stretch 2 reps;20 seconds                  PT Short Term Goals - 05/15/16 2146      PT SHORT TERM GOAL #1   Title Pt will be I with ankle AROM and strength    Status Achieved     PT SHORT TERM GOAL #2   Title Pt will be I with ice and self care for pain relief.    Status Achieved     PT SHORT TERM GOAL #3   Title Pt will be able to stand/walk with LRAD and min to no limp, min pain increase in ankle, L hip.    Status On-going           PT Long Term Goals - 05/15/16 2146      PT LONG TERM GOAL #1   Title Pt will be able to walk without boot (as allowed by MD) and cane, no limp for short distances in the community.    Status Unable to assess  PT LONG TERM GOAL #2   Title Pt will demo 4+/5 strength in ankle all planes for maximal stability and control with gait.    Status On-going     PT LONG TERM GOAL #3   Title Pt will be able to stand on Rt. LE and perform 10 heel raises with UE support no increased pain.    Status On-going     PT LONG TERM GOAL #4   Title Pt will negotiate >/= 6 steps in the community reciprocally without increasing ankle pain and at least 1 UE support.    Status On-going     PT LONG TERM GOAL #5   Title Pt will be I with HEP for standing LE strength, ROM    Status On-going               Plan - 05/17/16 1722    Clinical Impression Statement Pt reports she is beginning to feel pain again while wearing boot. She is tender medial ankle and lower medial leg, calf. Began standing calf stretch. She may try without the boot at home for ahwile. Trial of KT tape I strips from arch to medial knee.    PT Next Visit Plan  PT please test hip and knee MMT.  Continue [place] and Vaso if helpful; review 4 way ankle and inversion isometrics: assess tape and  repeat ionto if tape not helpful    PT Home Exercise Plan ankle AROM , red T band 4 way      Patient will benefit from  skilled therapeutic intervention in order to improve the following deficits and impairments:  Abnormal gait, Decreased range of motion, Difficulty walking, Increased fascial restricitons, Obesity, Decreased activity tolerance, Pain, Decreased balance, Decreased mobility, Decreased strength, Increased edema, Postural dysfunction, Impaired flexibility  Visit Diagnosis: Localized edema  Stiffness of right ankle, not elsewhere classified  Pain in right ankle and joints of right foot  Difficulty in walking, not elsewhere classified     Problem List Patient Active Problem List   Diagnosis Date Noted  . Osteoarthritis of left hip 02/24/2016  . Status post left hip replacement 02/24/2016  . Varicose veins 04/20/2015  . Lipidemia 04/20/2015  . Rheumatoid arthritis (HCC) 11/20/2013  . Low back pain 11/20/2013  . HTN (hypertension) 11/20/2013  . GERD (gastroesophageal reflux disease) 11/20/2013  . Bladder spasm 11/20/2013  . Hyperlipidemia 11/20/2013  . Status post right hip replacement 11/20/2013  . Bilateral swelling of feet   . Bruises easily     Sherrie Mustache, [place] 05/17/2016, 5:28 PM  [facility] [address] [place], [place], [address] Phone: [phone]   Fax:  [phone]  Name: LAKEVIA PERRIS MRN: [id number] Date of Birth: 12/03/1945

## 2016-05-22 ENCOUNTER — Ambulatory Visit: Payer: Medicare PPO | Admitting: Physical Therapy

## 2016-05-22 DIAGNOSIS — M25571 Pain in right ankle and joints of right foot: Secondary | ICD-10-CM

## 2016-05-22 DIAGNOSIS — M25671 Stiffness of right ankle, not elsewhere classified: Secondary | ICD-10-CM

## 2016-05-22 DIAGNOSIS — R262 Difficulty in walking, not elsewhere classified: Secondary | ICD-10-CM

## 2016-05-22 DIAGNOSIS — N8189 Other female genital prolapse: Secondary | ICD-10-CM | POA: Diagnosis not present

## 2016-05-22 DIAGNOSIS — R600 Localized edema: Secondary | ICD-10-CM

## 2016-05-22 NOTE — Therapy (Signed)
[facility] Outpatient Rehabilitation [facility] [address] [place], [place], [address] Phone: [phone]   Fax:  [phone]  Physical Therapy Treatment  Patient Details  Name: Kathy Duran MRN: [id number] Date of Birth: May 16, 1946 Referring Provider: Dr. Doneen Poisson   Encounter Date: 05/22/2016      PT End of Session - 05/22/16 1626    Visit Number 7   Number of Visits 16   Date for PT Re-Evaluation 06/19/16   PT Start Time 0345   PT Stop Time 0430   PT Time Calculation (min) 45 min      Past Medical History:  Diagnosis Date  . Anxiety   . Arthritis    left hip  . Asthma    Inhalers for tx  . Bilateral swelling of feet    bilateral ankle and feet edema  . Bruises easily   . Hypertension   . Sinus complaint   . Sjogren's syndrome (HCC)    dry eyes  . Urinary incontinence    occ. an issue wears pads    Past Surgical History:  Procedure Laterality Date  . ABDOMINAL HYSTERECTOMY    . BACK SURGERY    . BREAST SURGERY Right    cyst drainage  . SHOULDER SURGERY Right    right shoulder tendon and rotator cuff repair  . TOTAL HIP ARTHROPLASTY Right 2006  . TOTAL HIP ARTHROPLASTY Left 02/24/2016   Procedure: LEFT TOTAL HIP ARTHROPLASTY ANTERIOR APPROACH;  Surgeon: Kathryne Hitch, MD;  Location: [facility];  Service: Orthopedics;  Laterality: Left;    There were no vitals filed for this visit.      Subjective Assessment - 05/22/16 1550    Subjective Tape helpful. Only hurt after about 5 hours in the boot.             [facility] PT Assessment - 05/22/16 0001      Strength   Right Ankle Dorsiflexion 4/5   Right Ankle Inversion 4/5   Right Ankle Eversion 4/5                     [facility] Adult PT Treatment/Exercise - 05/22/16 0001      Ultrasound   Ultrasound Location medial rt ankle    Ultrasound Parameters 1 MHZ 50% 1 MHZ x 8 minutes   Ultrasound Goals Pain     Manual Therapy   Manual Therapy Taping    Kinesiotex Inhibit Muscle     Kinesiotix   Inhibit Muscle  Peroneal Tendon     Ankle Exercises: Stretches   Slant Board Stretch 2 reps;30 seconds     Ankle Exercises: Aerobic   Stationary Bike L2 x 5 minutes     Ankle Exercises: Supine   T-Band red band x 10 each     Ankle Exercises: Seated   Heel Raises 20 reps  3#   Toe Raise 20 reps  3#                  PT Short Term Goals - 05/15/16 2146      PT SHORT TERM GOAL #1   Title Pt will be I with ankle AROM and strength    Status Achieved     PT SHORT TERM GOAL #2   Title Pt will be I with ice and self care for pain relief.    Status Achieved     PT SHORT TERM GOAL #3   Title Pt will be able to stand/walk with LRAD  and min to no limp, min pain increase in ankle, L hip.    Status On-going           PT Long Term Goals - 05/15/16 2146      PT LONG TERM GOAL #1   Title Pt will be able to walk without boot (as allowed by MD) and cane, no limp for short distances in the community.    Status Unable to assess     PT LONG TERM GOAL #2   Title Pt will demo 4+/5 strength in ankle all planes for maximal stability and control with gait.    Status On-going     PT LONG TERM GOAL #3   Title Pt will be able to stand on Rt. LE and perform 10 heel raises with UE support no increased pain.    Status On-going     PT LONG TERM GOAL #4   Title Pt will negotiate >/= 6 steps in the community reciprocally without increasing ankle pain and at least 1 UE support.    Status On-going     PT LONG TERM GOAL #5   Title Pt will be I with HEP for standing LE strength, ROM    Status On-going               Plan - 05/22/16 1637    Clinical Impression Statement Pt reports decreased pain while walking in cam boot. 5-6 hours of activity on feet prior to pain. Her strength for inversion and eversion has improved. She reports tape helpful for medial ankle pain however it came off in the shower. Reapplied tape and asked her not to  use moisturizer so the tape would not come off. Review of closed chain dorsiflexion stretch, ot has ben perorming at home. She has one more visit this week and sees MD for follow up next week.    PT Next Visit Plan  PT please test hip and knee MMT. CHECK GOALS;  Continue [place] and Vaso if helpful; review 4 way ankle and inversion isometrics: assess tape and  repeat ionto if tape not helpful       Patient will benefit from skilled therapeutic intervention in order to improve the following deficits and impairments:  Abnormal gait, Decreased range of motion, Difficulty walking, Increased fascial restricitons, Obesity, Decreased activity tolerance, Pain, Decreased balance, Decreased mobility, Decreased strength, Increased edema, Postural dysfunction, Impaired flexibility  Visit Diagnosis: Localized edema  Stiffness of right ankle, not elsewhere classified  Pain in right ankle and joints of right foot  Difficulty in walking, not elsewhere classified     Problem List Patient Active Problem List   Diagnosis Date Noted  . Osteoarthritis of left hip 02/24/2016  . Status post left hip replacement 02/24/2016  . Varicose veins 04/20/2015  . Lipidemia 04/20/2015  . Rheumatoid arthritis (HCC) 11/20/2013  . Low back pain 11/20/2013  . HTN (hypertension) 11/20/2013  . GERD (gastroesophageal reflux disease) 11/20/2013  . Bladder spasm 11/20/2013  . Hyperlipidemia 11/20/2013  . Status post right hip replacement 11/20/2013  . Bilateral swelling of feet   . Bruises easily     Sherrie Mustache, [place] 05/22/2016, 4:43 PM  [facility] [address] [place], [place], [address] Phone: [phone]   Fax:  [phone]  Name: Kathy Duran MRN: [id number] Date of Birth: 04-20-1946

## 2016-05-24 ENCOUNTER — Ambulatory Visit: Payer: Medicare PPO | Admitting: Physical Therapy

## 2016-05-30 ENCOUNTER — Ambulatory Visit: Payer: Medicare PPO | Attending: Orthopaedic Surgery | Admitting: Physical Therapy

## 2016-05-30 DIAGNOSIS — R262 Difficulty in walking, not elsewhere classified: Secondary | ICD-10-CM | POA: Insufficient documentation

## 2016-05-30 DIAGNOSIS — M25671 Stiffness of right ankle, not elsewhere classified: Secondary | ICD-10-CM | POA: Diagnosis not present

## 2016-05-30 DIAGNOSIS — M25571 Pain in right ankle and joints of right foot: Secondary | ICD-10-CM

## 2016-05-30 DIAGNOSIS — R600 Localized edema: Secondary | ICD-10-CM | POA: Diagnosis not present

## 2016-05-30 NOTE — Therapy (Signed)
[place], [place], [address] Phone: [phone]   Fax:  [phone]  Physical Therapy Treatment  Patient Details  Name: Kathy Duran MRN: [id number] Date of Birth: October 08, 1945 Referring Provider: Dr. Jean Rosenthal   Encounter Date: 05/30/2016      PT End of Session - 05/30/16 1401    Visit Number 8   Number of Visits 16   Date for PT Re-Evaluation 06/19/16   PT Start Time 0130   PT Stop Time 0215   PT Time Calculation (min) 45 min      Past Medical History:  Diagnosis Date  . Anxiety   . Arthritis    left hip  . Asthma    Inhalers for tx  . Bilateral swelling of feet    bilateral ankle and feet edema  . Bruises easily   . Hypertension   . Sinus complaint   . Sjogren's syndrome ([place])    dry eyes  . Urinary incontinence    occ. an issue wears pads    Past Surgical History:  Procedure Laterality Date  . ABDOMINAL HYSTERECTOMY    . BACK SURGERY    . BREAST SURGERY Right    cyst drainage  . SHOULDER SURGERY Right    right shoulder tendon and rotator cuff repair  . TOTAL HIP ARTHROPLASTY Right 2006  . TOTAL HIP ARTHROPLASTY Left 02/24/2016   Procedure: LEFT TOTAL HIP ARTHROPLASTY ANTERIOR APPROACH;  Surgeon: Mcarthur Rossetti, MD;  Location: [facility];  Service: Orthopedics;  Laterality: Left;    There were no vitals filed for this visit.      Subjective Assessment - 05/30/16 1429    Subjective Knee was so painful I had to take the boot off. Going to MD tomorrow   Pain Score 0-No pain   Pain Location Ankle            [facility] PT Assessment - 05/30/16 0001      Strength   Right Ankle Dorsiflexion 4+/5   Right Ankle Plantar Flexion --  NT in standing   Right Ankle Inversion 4/5   Right Ankle Eversion 4+/5                     [facility] Adult PT Treatment/Exercise - 05/30/16 0001      Ultrasound   Ultrasound Location medial Rt ankle    Ultrasound  Parameters 1 Mhz 50% 1.0 w/cm2   Ultrasound Goals Pain     Manual Therapy   Kinesiotex Inhibit Muscle     Kinesiotix   Inhibit Muscle  Peroneal Tendon     Ankle Exercises: Stretches   Slant Board Stretch 2 reps;30 seconds     Ankle Exercises: Aerobic   Stationary Bike Nustep L3 LE x 5 min  knee to painful for bike     Ankle Exercises: Standing   Heel Raises 10 reps  2 sets   Toe Raise 10 reps  2sets      Ankle Exercises: Supine   T-Band green band x 10x2  each                PT Education - 05/30/16 1428    Education provided Yes   Education Details HEP   Person(s) Educated Patient   Methods Explanation;Handout   Comprehension Verbalized understanding          PT Short Term Goals - 05/30/16 1438      PT SHORT TERM GOAL #1  Title Pt will be I with ankle AROM and strength    Time 4   Period Weeks   Status Achieved     PT SHORT TERM GOAL #2   Title Pt will be I with ice and self care for pain relief.    Time 4   Period Weeks   Status Achieved     PT SHORT TERM GOAL #3   Title Pt will be able to stand/walk with LRAD and min to no limp, min pain increase in ankle, L hip.    Baseline no ankle/hip pain, minimal limp   Time 4   Period Weeks   Status Achieved           PT Long Term Goals - 05/30/16 1439      PT LONG TERM GOAL #1   Title Pt will be able to walk without boot (as allowed by MD) and cane, no limp for short distances in the community.    Time 8   Period Weeks   Status Partially Met     PT LONG TERM GOAL #2   Title Pt will demo 4+/5 strength in ankle all planes for maximal stability and control with gait.    Time 8   Period Weeks   Status Partially Met     PT LONG TERM GOAL #3   Title Pt will be able to stand on Rt. LE and perform 10 heel raises with UE support no increased pain.    Time 8   Period Weeks   Status Unable to assess     PT LONG TERM GOAL #4   Title Pt will negotiate >/= 6 steps in the community reciprocally  without increasing ankle pain and at least 1 UE support.    Time 8   Period Weeks   Status Unable to assess     PT LONG TERM GOAL #5   Title Pt will be I with HEP for standing LE strength, ROM    Baseline began closed chain today   Time 8   Period Weeks   Status On-going               Plan - 05/30/16 1430    Clinical Impression Statement Pt reports no ankle or hip pain today, She took the boot off 3 days ago due to increased knee pain. She has a palpable area of edema at right lateral knee which is tender to the touch. She has 2 more covered visits after today so she would like to come 1 x per week. Instructed pt in standing heel and toe raises as well as tandem balance without increased pain. Updated HEP and encouraged her to keep stretching her calves. She still has tenderness medial ankle so repeated the ultrasound and tape per pt  request.    PT Next Visit Plan FOTO; see what MD said; closed chain and gait as tolerated; 2 more covered visits; repeat US /tape as needed   PT Home Exercise Plan ankle AROM , green T band 4 way, heel raise, toe raise, Tandem stance   Consulted and Agree with Plan of Care Patient      Patient will benefit from skilled therapeutic intervention in order to improve the following deficits and impairments:  Abnormal gait, Decreased range of motion, Difficulty walking, Increased fascial restricitons, Obesity, Decreased activity tolerance, Pain, Decreased balance, Decreased mobility, Decreased strength, Increased edema, Postural dysfunction, Impaired flexibility  Visit Diagnosis: Localized edema  Stiffness of right ankle, not elsewhere  classified  Pain in right ankle and joints of right foot  Difficulty in walking, not elsewhere classified     Problem List Patient Active Problem List   Diagnosis Date Noted  . Osteoarthritis of left hip 02/24/2016  . Status post left hip replacement 02/24/2016  . Varicose veins 04/20/2015  . Lipidemia  04/20/2015  . Rheumatoid arthritis ([place]) 11/20/2013  . Low back pain 11/20/2013  . HTN (hypertension) 11/20/2013  . GERD (gastroesophageal reflux disease) 11/20/2013  . Bladder spasm 11/20/2013  . Hyperlipidemia 11/20/2013  . Status post right hip replacement 11/20/2013  . Bilateral swelling of feet   . Bruises easily     Dorene Ar , [place] 05/30/2016, 2:49 PM  [place], [place], [address] Phone: [phone]   Fax:  [phone]  Name: LIGAYA CORMIER MRN: [id number] Date of Birth: [birth year]

## 2016-05-30 NOTE — Patient Instructions (Signed)
Tandem Stance    Left foot in front of right, heel touching toe both feet "straight ahead".Balance in this position _60__ seconds. Do with left foot in front of right.   Heel Raise: Bilateral (Standing)   Rise on balls of feet. Repeat _10-20___ times per set. Do ___1_ sets per session. Do __2__ sessions per day.     DORSIFLEXION STRENGTHENING:  Toe Raise (Standing)   Rock back on heels. Repeat _10-20___ times per set. Do __1-2__ sets per session. Do _2___ sessions per day.

## 2016-05-31 ENCOUNTER — Encounter (INDEPENDENT_AMBULATORY_CARE_PROVIDER_SITE_OTHER): Payer: Self-pay | Admitting: Orthopaedic Surgery

## 2016-05-31 ENCOUNTER — Ambulatory Visit (INDEPENDENT_AMBULATORY_CARE_PROVIDER_SITE_OTHER): Payer: Medicare PPO | Admitting: Orthopaedic Surgery

## 2016-05-31 DIAGNOSIS — M25561 Pain in right knee: Secondary | ICD-10-CM | POA: Diagnosis not present

## 2016-05-31 DIAGNOSIS — M76821 Posterior tibial tendinitis, right leg: Secondary | ICD-10-CM | POA: Diagnosis not present

## 2016-05-31 DIAGNOSIS — Z96641 Presence of right artificial hip joint: Secondary | ICD-10-CM | POA: Diagnosis not present

## 2016-05-31 MED ORDER — METHYLPREDNISOLONE ACETATE 40 MG/ML IJ SUSP
40.0000 mg | INTRAMUSCULAR | Status: AC | PRN
  Administered 2016-05-31: 40 mg via INTRA_ARTICULAR

## 2016-05-31 MED ORDER — LIDOCAINE HCL 1 % IJ SOLN
3.0000 mL | INTRAMUSCULAR | Status: AC | PRN
  Administered 2016-05-31: 3 mL

## 2016-05-31 MED ORDER — TRAMADOL HCL 50 MG PO TABS: 50.0000 mg | ORAL_TABLET | Freq: Four times a day (QID) | ORAL | 0 refills | Status: DC | PRN

## 2016-05-31 NOTE — Progress Notes (Signed)
Office Visit Note   Patient: Kathy Duran           Date of Birth: [birth year]           MRN: [id number] Visit Date: 05/31/2016              Requested by: Georgann Housekeeper, MD [address] [place], [place] [address] PCP: Georgann Housekeeper, MD   Assessment & Plan: Visit Diagnoses: No diagnosis found.  Plan: She tolerated a right knee steroid injection well. She is already out of her cam walking boot for her posterior tibial tendon dysfunction. At this point I do not need to see her back for 6 months. I can always see her sooner if there is any issues. At her 6 month visit I would like a low AP pelvis and lateral of her right operative hip. We can always x-ray her knee if needed.  Follow-Up Instructions: No Follow-up on file.   Orders:  Orders Placed This Encounter  Procedures  . Large Joint Injection/Arthrocentesis   Meds ordered this encounter  Medications  . traMADol (ULTRAM) 50 MG tablet    Sig: Take 1-2 tablets (50-100 mg total) by mouth every 6 (six) hours as needed.    Dispense:  60 tablet    Refill:  0      Procedures: Large Joint Inj Date/Time: 05/31/2016 2:20 PM Performed by: Kathryne Hitch Authorized by: Kathryne Hitch   Indications:  Pain Location:  Knee Site:  R knee Needle Size:  22 G Ultrasound Guidance: No   Fluoroscopic Guidance: No   Arthrogram: No Medications:  3 mL lidocaine 1 %; 40 mg methylPREDNISolone acetate 40 MG/ML     Clinical Data: No additional findings.   Subjective: Chief Complaint  Patient presents with  . Left Hip - Routine Post Op, Follow-up    02/24/16 L THA 14 weeks PO  . Right Ankle - Pain   We will follow her for issues with her right posterior tibial tendon. She stopped her cam walking boot this past Sunday. She's been going to physical therapy as well. She does report right knee pain assess her left hip replacement is doing well. HPI  Review of Systems  Denies any headache chest pain  fever chills nausea or vomiting Objective: Vital Signs: There were no vitals taken for this visit.  Physical Exam  Ortho Exam Damage from her left hip shows full range of motion with no difficulties or pain whatsoever. Her incisions well-healed. Examination of her right knee shows medial lateral joint line tenderness with some slight patellofemoral crepitation. There is possible this mild effusion. She has pain over the posterior tibial tendon on the right. Specialty Comments:  No specialty comments available.  Imaging: No results found.   PMFS History: Patient Active Problem List   Diagnosis Date Noted  . Osteoarthritis of left hip 02/24/2016  . Status post left hip replacement 02/24/2016  . Varicose veins 04/20/2015  . Lipidemia 04/20/2015  . Rheumatoid arthritis (HCC) 11/20/2013  . Low back pain 11/20/2013  . HTN (hypertension) 11/20/2013  . GERD (gastroesophageal reflux disease) 11/20/2013  . Bladder spasm 11/20/2013  . Hyperlipidemia 11/20/2013  . Status post right hip replacement 11/20/2013  . Bilateral swelling of feet   . Bruises easily    Past Medical History:  Diagnosis Date  . Anxiety   . Arthritis    left hip  . Asthma    Inhalers for tx  . Bilateral swelling of feet  bilateral ankle and feet edema  . Bruises easily   . Hypertension   . Sinus complaint   . Sjogren's syndrome (HCC)    dry eyes  . Urinary incontinence    occ. an issue wears pads    Family History  Problem Relation Age of Onset  . Arrhythmia Mother   . Stroke Mother   . Hyperlipidemia Mother   . Diabetes Mother   . Hypertension Mother   . Heart attack Father   . Diabetes Father   . Cancer Father   . Hypertension Father   . Hyperlipidemia Maternal Grandmother   . Hypertension Maternal Grandmother   . Heart attack Paternal Grandfather   . Stroke Paternal Grandfather   . Asthma Brother   . Emphysema Brother   . Hypertension Brother   . Diabetes Sister   . Hypertension Sister    . Hyperlipidemia Sister     Past Surgical History:  Procedure Laterality Date  . ABDOMINAL HYSTERECTOMY    . BACK SURGERY    . BREAST SURGERY Right    cyst drainage  . SHOULDER SURGERY Right    right shoulder tendon and rotator cuff repair  . TOTAL HIP ARTHROPLASTY Right 2006  . TOTAL HIP ARTHROPLASTY Left 02/24/2016   Procedure: LEFT TOTAL HIP ARTHROPLASTY ANTERIOR APPROACH;  Surgeon: Kathryne Hitch, MD;  Location: [facility];  Service: Orthopedics;  Laterality: Left;   Social History   Occupational History  . Not on file.   Social History Main Topics  . Smoking status: Former Smoker    Quit date: 11/21/1983  . Smokeless tobacco: Never Used  . Alcohol use No  . Drug use: No  . Sexual activity: Yes

## 2016-06-07 ENCOUNTER — Ambulatory Visit: Payer: Medicare PPO | Admitting: Physical Therapy

## 2016-06-07 DIAGNOSIS — R262 Difficulty in walking, not elsewhere classified: Secondary | ICD-10-CM | POA: Diagnosis not present

## 2016-06-07 DIAGNOSIS — M25571 Pain in right ankle and joints of right foot: Secondary | ICD-10-CM | POA: Diagnosis not present

## 2016-06-07 DIAGNOSIS — M25671 Stiffness of right ankle, not elsewhere classified: Secondary | ICD-10-CM

## 2016-06-07 DIAGNOSIS — R600 Localized edema: Secondary | ICD-10-CM | POA: Diagnosis not present

## 2016-06-07 NOTE — Therapy (Signed)
[place], [place], [address] Phone: [phone]   Fax:  [phone]  Physical Therapy Treatment  Patient Details  Name: Kathy Duran MRN: [id number] Date of Birth: 05-28-1946 Referring Provider: Dr. Jean Rosenthal   Encounter Date: 06/07/2016      PT End of Session - 06/07/16 1230    Visit Number 9   Authorization Type [employer]   Authorization Time Period 11/1 to 12/16   Authorization - Visit Number 2   Authorization - Number of Visits 3   PT Start Time 7253   PT Stop Time 1150   PT Time Calculation (min) 52 min   Activity Tolerance Patient tolerated treatment well   Behavior During Therapy [facility] for tasks assessed/performed      Past Medical History:  Diagnosis Date  . Anxiety   . Arthritis    left hip  . Asthma    Inhalers for tx  . Bilateral swelling of feet    bilateral ankle and feet edema  . Bruises easily   . Hypertension   . Sinus complaint   . Sjogren's syndrome ([place])    dry eyes  . Urinary incontinence    occ. an issue wears pads    Past Surgical History:  Procedure Laterality Date  . ABDOMINAL HYSTERECTOMY    . BACK SURGERY    . BREAST SURGERY Right    cyst drainage  . SHOULDER SURGERY Right    right shoulder tendon and rotator cuff repair  . TOTAL HIP ARTHROPLASTY Right 2006  . TOTAL HIP ARTHROPLASTY Left 02/24/2016   Procedure: LEFT TOTAL HIP ARTHROPLASTY ANTERIOR APPROACH;  Surgeon: Mcarthur Rossetti, MD;  Location: [facility];  Service: Orthopedics;  Laterality: Left;    There were no vitals filed for this visit.      Subjective Assessment - 06/07/16 1103    Subjective I can control the foot pain by doing my ex and icing and propping. Went out to shop for 1 hour and felt really bad pain that night.  Knee pops and cracks.    Currently in Pain? No/denies           [facility] Adult PT Treatment/Exercise - 06/07/16 1105      Cryotherapy   Number Minutes  Cryotherapy 10 Minutes   Cryotherapy Location Ankle   Type of Cryotherapy Ice pack     Ultrasound   Ultrasound Location med Rt. ankle    Ultrasound Parameters 1 MHz, 50% 1.0 W/cm2   Ultrasound Goals Pain     Manual Therapy   Kinesiotex Inhibit Muscle     Kinesiotix   Inhibit Muscle  Peroneal Tendon  2 strips medial to lateral under arch      Ankle Exercises: Aerobic   Stationary Bike NuStep LE only L6 for strength      Ankle Exercises: Standing   SLS on foam   Heel Raises 20 reps   Toe Raise 5 reps  longer hold    Other Standing Ankle Exercises front lunge on foam board3 x 30 sec    Other Standing Ankle Exercises squat on foam with bilat UE assist  standing hip ext x 10 reps      Ankle Exercises: Stretches   Slant Board Stretch 2 reps;30 seconds                  PT Short Term Goals - 05/30/16 1438      PT SHORT TERM GOAL #1   Title  Pt will be I with ankle AROM and strength    Time 4   Period Weeks   Status Achieved     PT SHORT TERM GOAL #2   Title Pt will be I with ice and self care for pain relief.    Time 4   Period Weeks   Status Achieved     PT SHORT TERM GOAL #3   Title Pt will be able to stand/walk with LRAD and min to no limp, min pain increase in ankle, L hip.    Baseline no ankle/hip pain, minimal limp   Time 4   Period Weeks   Status Achieved           PT Long Term Goals - 06/07/16 1235      PT LONG TERM GOAL #1   Title Pt will be able to walk without boot (as allowed by MD) and cane, no limp for short distances in the community.    Baseline very min limp    Status Partially Met     PT LONG TERM GOAL #2   Title Pt will demo 4+/5 strength in ankle all planes for maximal stability and control with gait.    Status Achieved     PT LONG TERM GOAL #3   Title Pt will be able to stand on Rt. LE and perform 10 heel raises with UE support no increased pain.    Baseline with tape and shoe   Status Achieved     PT LONG TERM GOAL #4    Title Pt will negotiate >/= 6 steps in the community reciprocally without increasing ankle pain and at least 1 UE support.    Status Achieved     PT LONG TERM GOAL #5   Title Pt will be I with HEP for standing LE strength, ROM    Status On-going               Plan - 06/07/16 1232    Clinical Impression Statement Pt has been out of the boot for 1 week.  She has seen a 90% improvement.  Cont to have Rt. knee pain, but not ready ot have surgery yet. Works diligently on [employer].  She has 1 more visit remaining in time period allowed by [facility].  Tolerated standing ther ex in parallel bars with only knee pain, no ankle pain. She may not need anymore ankle/foot visits but has other orthopedic limitations that may benefit from PT    PT Next Visit Plan FOTO; closed chain and gait as tolerated; 1 more covered visits; repeat US /tape as needed   PT Home Exercise Plan ankle AROM , green T band 4 way, heel raise, toe raise, Tandem stance   Consulted and Agree with Plan of Care Patient      Patient will benefit from skilled therapeutic intervention in order to improve the following deficits and impairments:  Abnormal gait, Decreased range of motion, Difficulty walking, Increased fascial restricitons, Obesity, Decreased activity tolerance, Pain, Decreased balance, Decreased mobility, Decreased strength, Increased edema, Postural dysfunction, Impaired flexibility  Visit Diagnosis: Localized edema  Stiffness of right ankle, not elsewhere classified  Pain in right ankle and joints of right foot  Difficulty in walking, not elsewhere classified     Problem List Patient Active Problem List   Diagnosis Date Noted  . Osteoarthritis of left hip 02/24/2016  . Status post left hip replacement 02/24/2016  . Varicose veins 04/20/2015  . Lipidemia 04/20/2015  . Rheumatoid arthritis (  [place]) 11/20/2013  . Low back pain 11/20/2013  . HTN (hypertension) 11/20/2013  . GERD (gastroesophageal reflux  disease) 11/20/2013  . Bladder spasm 11/20/2013  . Hyperlipidemia 11/20/2013  . Status post right hip replacement 11/20/2013  . Bilateral swelling of feet   . Bruises easily     Kathy Duran 06/07/2016, 12:37 PM  [place], [place], [address] Phone: [phone]   Fax:  [phone]  Name: Kathy Duran MRN: [id number] Date of Birth: [birth year]  Raeford Razor, PT 06/07/16 12:38 PM Phone: [phone] Fax: [phone]

## 2016-06-11 ENCOUNTER — Ambulatory Visit: Payer: Medicare PPO | Admitting: Physical Therapy

## 2016-06-11 DIAGNOSIS — M25671 Stiffness of right ankle, not elsewhere classified: Secondary | ICD-10-CM

## 2016-06-11 DIAGNOSIS — R262 Difficulty in walking, not elsewhere classified: Secondary | ICD-10-CM | POA: Diagnosis not present

## 2016-06-11 DIAGNOSIS — M25571 Pain in right ankle and joints of right foot: Secondary | ICD-10-CM | POA: Diagnosis not present

## 2016-06-11 DIAGNOSIS — R600 Localized edema: Secondary | ICD-10-CM

## 2016-06-11 NOTE — Therapy (Signed)
[place], [place], [address] Phone: [phone]   Fax:  [phone]  Physical Therapy Treatment/Discharge Patient Details  Name: Kathy Duran MRN: [id number] Date of Birth: [birth year] Referring Provider: Dr. Jean Rosenthal   Encounter Date: 06/11/2016      PT End of Session - 06/11/16 1141    Visit Number 10   Number of Visits 16   Date for PT Re-Evaluation 06/19/16   Authorization Type [employer]   Authorization Time Period 11/1 to 12/16   Authorization - Visit Number 3   Authorization - Number of Visits 3   PT Start Time 2409   PT Stop Time 1145   PT Time Calculation (min) 42 min   Activity Tolerance Patient tolerated treatment well   Behavior During Therapy [facility] for tasks assessed/performed      Past Medical History:  Diagnosis Date  . Anxiety   . Arthritis    left hip  . Asthma    Inhalers for tx  . Bilateral swelling of feet    bilateral ankle and feet edema  . Bruises easily   . Hypertension   . Sinus complaint   . Sjogren's syndrome ([place])    dry eyes  . Urinary incontinence    occ. an issue wears pads    Past Surgical History:  Procedure Laterality Date  . ABDOMINAL HYSTERECTOMY    . BACK SURGERY    . BREAST SURGERY Right    cyst drainage  . SHOULDER SURGERY Right    right shoulder tendon and rotator cuff repair  . TOTAL HIP ARTHROPLASTY Right 2006  . TOTAL HIP ARTHROPLASTY Left 02/24/2016   Procedure: LEFT TOTAL HIP ARTHROPLASTY ANTERIOR APPROACH;  Surgeon: Mcarthur Rossetti, MD;  Location: [facility];  Service: Orthopedics;  Laterality: Left;    There were no vitals filed for this visit.      Subjective Assessment - 06/11/16 1106    Subjective No pain in the foot.  Knee is 2/10 with weight bearing and when I bend it.  Seeing Dr. [place] Alar 12/8.     Currently in Pain? No/denies            [facility] Adult PT Treatment/Exercise - 06/11/16 1114      High Level  Balance   High Level Balance Comments parallel bars tandem x 3, slant board M/L and A/P shifting and controlled balance , towel sweeps in semicircle for SLB work      Ultrasound   Ultrasound Location med ankle   Ultrasound Parameters 1 MHz, 50% , 1.0 W/cm2, 8 min    Ultrasound Goals Pain     Ankle Exercises: Standing   Rocker Board 5 minutes  see above for details    Heel Raises 20 reps     Ankle Exercises: Aerobic   Stationary Bike 6 min , NuStep LE only L6 for strength                 PT Education - 06/11/16 1322    Education provided Yes   Education Details balance, final HEP    Person(s) Educated Patient   Methods Explanation   Comprehension Verbalized understanding          PT Short Term Goals - 05/30/16 1438      PT SHORT TERM GOAL #1   Title Pt will be I with ankle AROM and strength    Time 4   Period Weeks   Status Achieved  PT SHORT TERM GOAL #2   Title Pt will be I with ice and self care for pain relief.    Time 4   Period Weeks   Status Achieved     PT SHORT TERM GOAL #3   Title Pt will be able to stand/walk with LRAD and min to no limp, min pain increase in ankle, L hip.    Baseline no ankle/hip pain, minimal limp   Time 4   Period Weeks   Status Achieved           PT Long Term Goals - 06-18-16 1326      PT LONG TERM GOAL #1   Title Pt will be able to walk without boot (as allowed by MD) and cane, no limp for short distances in the community.    Baseline very min limp    Status Partially Met     PT LONG TERM GOAL #2   Title Pt will demo 4+/5 strength in ankle all planes for maximal stability and control with gait.    Status Achieved     PT LONG TERM GOAL #3   Title Pt will be able to stand on Rt. LE and perform 10 heel raises with UE support no increased pain.    Status Achieved     PT LONG TERM GOAL #4   Title Pt will negotiate >/= 6 steps in the community reciprocally without increasing ankle pain and at least 1 UE  support.    Status Achieved     PT LONG TERM GOAL #5   Title Pt will be I with HEP for standing LE strength, ROM    Status Achieved               Plan - 06/18/2016 1520    Clinical Impression Statement Pt out of authorized visits.  She has been able to meet all LTG and STG except that she does walk with a slight limp.  She is motivated to stay active, plans to see specialist for knee, avoid surgery.  She is ready for DC.    PT Next Visit Plan NA, DC    PT Home Exercise Plan ankle AROM , green T band 4 way, heel raise, toe raise, Tandem stance   Consulted and Agree with Plan of Care Patient      Patient will benefit from skilled therapeutic intervention in order to improve the following deficits and impairments:  Abnormal gait, Decreased range of motion, Difficulty walking, Increased fascial restricitons, Obesity, Decreased activity tolerance, Pain, Decreased balance, Decreased mobility, Decreased strength, Increased edema, Postural dysfunction, Impaired flexibility  Visit Diagnosis: Localized edema  Stiffness of right ankle, not elsewhere classified  Pain in right ankle and joints of right foot  Difficulty in walking, not elsewhere classified       G-Codes - 06-18-16 1516    Functional Assessment Tool Used FOTO   Functional Limitation Mobility: Walking and moving around   Mobility: Walking and Moving Around Current Status [phone]) At least 40 percent but less than 60 percent impaired, limited or restricted   Mobility: Walking and Moving Around Goal Status [phone]) At least 40 percent but less than 60 percent impaired, limited or restricted   Mobility: Walking and Moving Around Discharge Status [phone]) At least 40 percent but less than 60 percent impaired, limited or restricted      Problem List Patient Active Problem List   Diagnosis Date Noted  . Osteoarthritis of left hip 02/24/2016  . Status post  left hip replacement 02/24/2016  . Varicose veins 04/20/2015  .  Lipidemia 04/20/2015  . Rheumatoid arthritis ([place]) 11/20/2013  . Low back pain 11/20/2013  . HTN (hypertension) 11/20/2013  . GERD (gastroesophageal reflux disease) 11/20/2013  . Bladder spasm 11/20/2013  . Hyperlipidemia 11/20/2013  . Status post right hip replacement 11/20/2013  . Bilateral swelling of feet   . Bruises easily     Kam Rahimi 06/11/2016, 3:21 PM  [place], [place], [address] Phone: [phone]   Fax:  [phone]  Name: VAUDINE DUTAN MRN: [id number] Date of Birth: [birth year]  PHYSICAL THERAPY DISCHARGE SUMMARY  Visits from Start of Care: 10  Current functional level related to goals / functional outcomes: See above for goals   Remaining deficits: LE swelling, knee and hip pain.  Balance.  Not limited by ankle pain.    Education / Equipment: HEP, gait, RICE  Plan: Patient agrees to discharge.  Patient goals were met. Patient is being discharged due to being pleased with the current functional level.  ?????    And financial reasons.   Raeford Razor, PT 06/11/16 3:23 PM Phone: [phone] Fax: [phone]

## 2016-06-18 ENCOUNTER — Ambulatory Visit: Payer: Medicare PPO | Admitting: Physical Therapy

## 2016-06-26 ENCOUNTER — Ambulatory Visit (INDEPENDENT_AMBULATORY_CARE_PROVIDER_SITE_OTHER): Payer: Medicare PPO | Admitting: Sports Medicine

## 2016-06-26 ENCOUNTER — Ambulatory Visit
Admission: RE | Admit: 2016-06-26 | Discharge: 2016-06-26 | Disposition: A | Discharge status: Home / Self Care | Payer: Medicare PPO | Source: Ambulatory Visit | Attending: Sports Medicine | Admitting: Sports Medicine

## 2016-06-26 ENCOUNTER — Encounter: Payer: Self-pay | Admitting: Sports Medicine

## 2016-06-26 VITALS — BP 154/79 | HR 89 | Ht 65.0 in | Wt 195.0 lb

## 2016-06-26 DIAGNOSIS — G8929 Other chronic pain: Secondary | ICD-10-CM

## 2016-06-26 DIAGNOSIS — M25571 Pain in right ankle and joints of right foot: Secondary | ICD-10-CM

## 2016-06-26 DIAGNOSIS — R269 Unspecified abnormalities of gait and mobility: Secondary | ICD-10-CM

## 2016-06-26 DIAGNOSIS — M7989 Other specified soft tissue disorders: Secondary | ICD-10-CM | POA: Diagnosis not present

## 2016-06-26 MED ORDER — HYDROCODONE-ACETAMINOPHEN 5-325 MG PO TABS: ORAL_TABLET | ORAL | 0 refills | Status: DC

## 2016-06-26 NOTE — Assessment & Plan Note (Signed)
I added a medial wedge lift to the right shoe Small heel pad to the left Was replaced on her older rigid orthotics  With this in place she was able to walk without a Trendelenburg gait There is slightly improved her genu valgus  She is going to Test this over the next month If this works well we should consider making her a more cushioned orthotic with these corrections

## 2016-06-26 NOTE — Assessment & Plan Note (Addendum)
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

## 2016-06-26 NOTE — Progress Notes (Signed)
Chief complaint right ankle pain  Patient has a history right ankle pain that started in high school She had several ankle sprains in high school when she playedl basketball In 1995 she required a cast on her right ankle for a bad sprain and possible fracture In 1998 after being put in orthotics she developed a lot of ankle pain She had to use a Cam Walker for several weeks to resolve this  In August 2017 she'll left hip or placement While she was recovering the right ankle began hurting a lot She was again put in a Cam Walker which helped her relieve the pain  She has been orthotics for a long time but These do not relieve the ankle pain  Past history Significant arthritis of the left hip Total hip replacement Dr. Magnus Ivan August 2017 History of bilateral knee arthritis History of lymphedema both lower legs Other past history noted in chart  Review of systems Right ankle hurts primarily along the medial aspect Ankle feels somewhat unsteady She feels that her legs are unequal length and she leans to the right side She has chronic swelling but not always worse on the right  Physical examination Pleasant female in no acute distress BP (!) 154/79   Pulse 89   Ht 5\' 5"  (1.651 m)   Wt 195 lb (88.5 kg)   BMI 32.45 kg/m   Bilateral generalized swelling from the knee down both lower extremities Bilateral ankle swelling Tenderness to palpation at the right sinus tarsi Tenderness just below the RT medial malleolus Stable ligamentous testing No pain on palpation of the Achilles tendon  Feet reveal pes planus bilaterally  She has normal hip range of motion on the right and is only slightly less on the left  Walking gait reveals a Trendelenburg to the right Leg length measurement suggested about 1 cm difference But landmarks are difficult  Walking gait shows bilateral genu valgus

## 2016-07-02 ENCOUNTER — Ambulatory Visit: Payer: Medicare PPO | Admitting: Rheumatology

## 2016-07-06 ENCOUNTER — Encounter: Payer: Self-pay | Admitting: *Deleted

## 2016-07-06 DIAGNOSIS — M51369 Other intervertebral disc degeneration, lumbar region without mention of lumbar back pain or lower extremity pain: Secondary | ICD-10-CM

## 2016-07-06 DIAGNOSIS — M19042 Primary osteoarthritis, left hand: Secondary | ICD-10-CM

## 2016-07-06 DIAGNOSIS — J45909 Unspecified asthma, uncomplicated: Secondary | ICD-10-CM | POA: Insufficient documentation

## 2016-07-06 DIAGNOSIS — M170 Bilateral primary osteoarthritis of knee: Secondary | ICD-10-CM

## 2016-07-06 DIAGNOSIS — M7918 Myalgia, other site: Secondary | ICD-10-CM | POA: Insufficient documentation

## 2016-07-06 DIAGNOSIS — M19041 Primary osteoarthritis, right hand: Secondary | ICD-10-CM

## 2016-07-06 DIAGNOSIS — M3500 Sicca syndrome, unspecified: Secondary | ICD-10-CM | POA: Insufficient documentation

## 2016-07-06 DIAGNOSIS — M5136 Other intervertebral disc degeneration, lumbar region: Secondary | ICD-10-CM

## 2016-07-06 DIAGNOSIS — M47816 Spondylosis without myelopathy or radiculopathy, lumbar region: Secondary | ICD-10-CM | POA: Insufficient documentation

## 2016-07-06 DIAGNOSIS — J302 Other seasonal allergic rhinitis: Secondary | ICD-10-CM

## 2016-07-06 HISTORY — DX: Primary osteoarthritis, right hand: M19.041

## 2016-07-06 HISTORY — DX: Other intervertebral disc degeneration, lumbar region: M51.36

## 2016-07-06 HISTORY — DX: Myalgia, other site: M79.18

## 2016-07-06 HISTORY — DX: Other seasonal allergic rhinitis: J30.2

## 2016-07-06 HISTORY — DX: Bilateral primary osteoarthritis of knee: M17.0

## 2016-07-06 HISTORY — DX: Other intervertebral disc degeneration, lumbar region without mention of lumbar back pain or lower extremity pain: M51.369

## 2016-07-06 NOTE — Progress Notes (Signed)
Office Visit Note  Patient: Kathy Duran             Date of Birth: [birth year]           MRN: [id number]             PCP: Georgann Housekeeper, MD Referring: Georgann Housekeeper, MD Visit Date: 07/09/2016 Occupation: @GUAROCC @    Subjective:  No chief complaint on file.   History of Present Illness: Kathy Duran is a 70 y.o. female with history of Sjogren's fibromyalgia and osteoarthritis. She states she had her left total hip replacement on 02/24/2016 by Dr. Rayburn Ma. She had some leg length discrepancy after the surgery. She's having difficulty walking due to discomfort in her right foot as that certainly and weightbearing foot. She is waiting to get new orthotics which will have adjustment. She's having some swelling and discomfort in her right knee joint. She has some stiffness in her hands but tolerable. The lower back pain is tolerable. She continues to have problems with sicca symptoms. She's been using over-the-counter products and Restasis eyedrops.  Activities of Daily Living:  Patient reports morning stiffness for 1 hour.   Patient Reports nocturnal pain.  Difficulty dressing/grooming: Denies Difficulty climbing stairs: Reports Difficulty getting out of chair: Reports Difficulty using hands for taps, buttons, cutlery, and/or writing: Denies   Review of Systems  Constitutional: Positive for fatigue. Negative for night sweats, weight gain, weight loss and weakness.  HENT: Positive for mouth dryness. Negative for mouth sores, trouble swallowing, trouble swallowing and nose dryness.   Eyes: Positive for redness. Negative for pain, visual disturbance and dryness.  Respiratory: Negative for cough, shortness of breath and difficulty breathing.   Cardiovascular: Negative for chest pain, palpitations, hypertension, irregular heartbeat and swelling in legs/feet.  Gastrointestinal: Positive for constipation. Negative for blood in stool and diarrhea.  Endocrine: Negative for increased  urination.  Genitourinary: Negative for vaginal dryness.  Musculoskeletal: Positive for arthralgias, joint pain, joint swelling, myalgias, morning stiffness and myalgias. Negative for muscle weakness and muscle tenderness.  Skin: Negative for color change, rash, hair loss, skin tightness, ulcers and sensitivity to sunlight.  Allergic/Immunologic: Negative for susceptible to infections.  Neurological: Negative for dizziness, memory loss and night sweats.  Hematological: Negative for swollen glands.  Psychiatric/Behavioral: Positive for sleep disturbance. Negative for depressed mood. The patient is not nervous/anxious.     PMFS History:  Patient Active Problem List   Diagnosis Date Noted  . Seasonal allergies 07/06/2016  . Asthma 07/06/2016  . Sjogren's syndrome (HCC) 07/06/2016  . DDD (degenerative disc disease), lumbar 07/06/2016  . Bilateral primary osteoarthritis of knee 07/06/2016  . Osteoarthritis of both hands 07/06/2016  . Myofacial muscle pain 07/06/2016  . DDD lumbar spine with spinal stenosis 07/06/2016  . Abnormality of gait 06/26/2016  . Chronic pain of right ankle 06/26/2016  . Osteoarthritis of left hip 02/24/2016  . Status post left hip replacement 02/24/2016  . Varicose veins 04/20/2015  . Lipidemia 04/20/2015  . Rheumatoid arthritis (HCC) 11/20/2013  . Low back pain 11/20/2013  . HTN (hypertension) 11/20/2013  . GERD (gastroesophageal reflux disease) 11/20/2013  . Bladder spasm 11/20/2013  . Hyperlipidemia 11/20/2013  . Status post right hip replacement 11/20/2013  . Bilateral swelling of feet   . Bruises easily     Past Medical History:  Diagnosis Date  . Anxiety   . Arthritis    left hip  . Asthma    Inhalers for tx  . Bilateral primary osteoarthritis of  knee 07/06/2016   Moderate  . Bilateral swelling of feet    bilateral ankle and feet edema  . Bruises easily   . DDD (degenerative disc disease), lumbar 07/06/2016  . Hypertension   . Myofacial  muscle pain 07/06/2016  . Osteoarthritis of both hands 07/06/2016  . Seasonal allergies 07/06/2016  . Sinus complaint   . Sjogren's syndrome (HCC)    dry eyes  . Tenonitis   . Urinary incontinence    occ. an issue wears pads    Family History  Problem Relation Age of Onset  . Arrhythmia Mother   . Stroke Mother   . Hyperlipidemia Mother   . Diabetes Mother   . Hypertension Mother   . Heart attack Father   . Diabetes Father   . Cancer Father   . Hypertension Father   . Hyperlipidemia Maternal Grandmother   . Hypertension Maternal Grandmother   . Heart attack Paternal Grandfather   . Stroke Paternal Grandfather   . Asthma Brother   . Emphysema Brother   . Hypertension Brother   . Diabetes Sister   . Hypertension Sister   . Hyperlipidemia Sister    Past Surgical History:  Procedure Laterality Date  . ABDOMINAL HYSTERECTOMY    . BACK SURGERY    . BREAST SURGERY Right    cyst drainage  . SHOULDER SURGERY Right    right shoulder tendon and rotator cuff repair  . TOTAL HIP ARTHROPLASTY Right 2006  . TOTAL HIP ARTHROPLASTY Left 02/24/2016   Procedure: LEFT TOTAL HIP ARTHROPLASTY ANTERIOR APPROACH;  Surgeon: Kathryne Hitch, MD;  Location: [facility];  Service: Orthopedics;  Laterality: Left;   Social History   Social History Narrative  . No narrative on file     Objective: Vital Signs: BP 138/87 (BP Location: Left Arm, Patient Position: Sitting, Cuff Size: Large)   Pulse 96   Ht 5\' 5"  (1.651 m)   Wt 206 lb (93.4 kg)   BMI 34.28 kg/m    Physical Exam  Constitutional: She is oriented to person, place, and time. She appears well-developed and well-nourished.  HENT:  Head: Normocephalic and atraumatic.  Eyes: Conjunctivae and EOM are normal.  Neck: Normal range of motion.  Cardiovascular: Normal rate, regular rhythm, normal heart sounds and intact distal pulses.   Pulmonary/Chest: Effort normal and breath sounds normal.  Abdominal: Soft. Bowel sounds are  normal.  Lymphadenopathy:    She has no cervical adenopathy.  Neurological: She is alert and oriented to person, place, and time.  Skin: Skin is warm and dry. Capillary refill takes less than 2 seconds.  Psychiatric: She has a normal mood and affect. Her behavior is normal.  Nursing note and vitals reviewed.    Musculoskeletal Exam: C-spine and thoracic lumbar spine good range of motion. She has good range of motion of her shoulders elbows wrist joints she has thickening of DIP joints consistent with osteoarthritis with no synovitis she is bilateral total hip replacement with some limitation with range of motion. She has limited extension of her right knee joint with some swelling. Left knee joint is doing well. Ankle joints MTPs PIPs are good range of motion with no swelling.  CDAI Exam: No CDAI exam completed.    Investigation: Findings:  02/22/2015 CBC normal, CMP normal, UA negative, urine drug screen negative    Imaging: Dg Ankle Complete Right  Result Date: 06/26/2016 CLINICAL DATA:  Ankle pain. EXAM: RIGHT ANKLE - COMPLETE 3+ VIEW COMPARISON:  04/23/2016. FINDINGS: Diffuse soft  tissue swelling. Stable calcaneal spurring and adjacent plantar fascia calcification. No acute abnormality identified . IMPRESSION: Diffuse soft tissue swelling.  No acute abnormality identified. Electronically Signed   By: Maisie Fus  Register   On: 06/26/2016 16:06    Speciality Comments: No specialty comments available.    Procedures:  No procedures performed Allergies: Arthrotec [diclofenac-misoprostol]; Augmentin [amoxicillin-pot clavulanate]; Latex; and Naproxen   Assessment / Plan:     Visit Diagnoses: Sjogren's syndrome  - Positive ANA, positive SSB, positive rheumatoid factor: She continues to have sicca symptoms. Over-the-counter products and Restasis has been helpful.  Myofacial muscle pain: She continues to have some generalized myalgias.  Status post bilateral hip replacements: She is  recovering well from her recent left total knee replacement.  Chronic pain of right knee: She continues to have some pain and swelling and discomfort in her right knee joint. I've given her a prescription for right knee joint brace.  DDD lumbar spine with spinal stenosis: This causes chronic discomfort and pain area  Primary osteoarthritis of both hands: She continues to have some stiffness joint protection and muscle strengthening was discussed.  Chronic pain: She is been on Ultram and muscle relaxers. Currently she is getting medications through her orthopedic surgeon. We will resume her medications when she is off the medications for knee replacement.  Her other medical problems are as follows for which she seen by other physicians:  Essential hypertension  Mixed hyperlipidemia  Asthma    Orders: No orders of the defined types were placed in this encounter.  No orders of the defined types were placed in this encounter.   Face-to-face time spent with patient was 30 minutes. 50% of time was spent in counseling and coordination of care.  Follow-Up Instructions: Return in about 6 months (around 01/07/2017).   Pollyann Savoy, MD

## 2016-07-09 ENCOUNTER — Ambulatory Visit (INDEPENDENT_AMBULATORY_CARE_PROVIDER_SITE_OTHER): Payer: Medicare PPO | Admitting: Rheumatology

## 2016-07-09 ENCOUNTER — Encounter: Payer: Self-pay | Admitting: Rheumatology

## 2016-07-09 VITALS — BP 138/87 | HR 96 | Ht 65.0 in | Wt 206.0 lb

## 2016-07-09 DIAGNOSIS — J45909 Unspecified asthma, uncomplicated: Secondary | ICD-10-CM

## 2016-07-09 DIAGNOSIS — M47816 Spondylosis without myelopathy or radiculopathy, lumbar region: Secondary | ICD-10-CM

## 2016-07-09 DIAGNOSIS — M3501 Sicca syndrome with keratoconjunctivitis: Secondary | ICD-10-CM | POA: Diagnosis not present

## 2016-07-09 DIAGNOSIS — M791 Myalgia: Secondary | ICD-10-CM

## 2016-07-09 DIAGNOSIS — M1612 Unilateral primary osteoarthritis, left hip: Secondary | ICD-10-CM | POA: Diagnosis not present

## 2016-07-09 DIAGNOSIS — M25561 Pain in right knee: Secondary | ICD-10-CM

## 2016-07-09 DIAGNOSIS — I10 Essential (primary) hypertension: Secondary | ICD-10-CM | POA: Diagnosis not present

## 2016-07-09 DIAGNOSIS — G8929 Other chronic pain: Secondary | ICD-10-CM

## 2016-07-09 DIAGNOSIS — M19041 Primary osteoarthritis, right hand: Secondary | ICD-10-CM

## 2016-07-09 DIAGNOSIS — Z96643 Presence of artificial hip joint, bilateral: Secondary | ICD-10-CM | POA: Diagnosis not present

## 2016-07-09 DIAGNOSIS — E782 Mixed hyperlipidemia: Secondary | ICD-10-CM

## 2016-07-09 DIAGNOSIS — M19042 Primary osteoarthritis, left hand: Secondary | ICD-10-CM

## 2016-07-09 DIAGNOSIS — M7918 Myalgia, other site: Secondary | ICD-10-CM

## 2016-07-09 NOTE — Patient Instructions (Signed)
Labs to in February 2018

## 2016-07-10 ENCOUNTER — Ambulatory Visit: Payer: Medicare PPO | Admitting: Rheumatology

## 2016-07-10 DIAGNOSIS — M1711 Unilateral primary osteoarthritis, right knee: Secondary | ICD-10-CM | POA: Diagnosis not present

## 2016-07-12 DIAGNOSIS — N390 Urinary tract infection, site not specified: Secondary | ICD-10-CM | POA: Diagnosis not present

## 2016-07-12 DIAGNOSIS — R350 Frequency of micturition: Secondary | ICD-10-CM | POA: Diagnosis not present

## 2016-07-19 ENCOUNTER — Other Ambulatory Visit: Payer: Self-pay | Admitting: Internal Medicine

## 2016-07-19 NOTE — Telephone Encounter (Signed)
REFILL 

## 2016-08-01 ENCOUNTER — Encounter: Payer: Medicare PPO | Admitting: Sports Medicine

## 2016-08-13 DIAGNOSIS — M3500 Sicca syndrome, unspecified: Secondary | ICD-10-CM | POA: Diagnosis not present

## 2016-08-13 DIAGNOSIS — J45909 Unspecified asthma, uncomplicated: Secondary | ICD-10-CM | POA: Diagnosis not present

## 2016-08-13 DIAGNOSIS — Z0000 Encounter for general adult medical examination without abnormal findings: Secondary | ICD-10-CM | POA: Diagnosis not present

## 2016-08-13 DIAGNOSIS — R7309 Other abnormal glucose: Secondary | ICD-10-CM | POA: Diagnosis not present

## 2016-08-13 DIAGNOSIS — M5090 Cervical disc disorder, unspecified, unspecified cervical region: Secondary | ICD-10-CM | POA: Diagnosis not present

## 2016-08-13 DIAGNOSIS — M179 Osteoarthritis of knee, unspecified: Secondary | ICD-10-CM | POA: Diagnosis not present

## 2016-08-13 DIAGNOSIS — Z1159 Encounter for screening for other viral diseases: Secondary | ICD-10-CM | POA: Diagnosis not present

## 2016-08-13 DIAGNOSIS — I10 Essential (primary) hypertension: Secondary | ICD-10-CM | POA: Diagnosis not present

## 2016-08-13 DIAGNOSIS — E782 Mixed hyperlipidemia: Secondary | ICD-10-CM | POA: Diagnosis not present

## 2016-08-13 DIAGNOSIS — J309 Allergic rhinitis, unspecified: Secondary | ICD-10-CM | POA: Diagnosis not present

## 2016-08-15 ENCOUNTER — Other Ambulatory Visit: Payer: Self-pay | Admitting: *Deleted

## 2016-08-15 MED ORDER — HYDROCODONE-ACETAMINOPHEN 5-325 MG PO TABS: ORAL_TABLET | ORAL | 0 refills | Status: DC

## 2016-08-21 ENCOUNTER — Ambulatory Visit (INDEPENDENT_AMBULATORY_CARE_PROVIDER_SITE_OTHER): Payer: Medicare PPO | Admitting: Sports Medicine

## 2016-08-21 ENCOUNTER — Encounter: Payer: Self-pay | Admitting: Sports Medicine

## 2016-08-21 DIAGNOSIS — G8929 Other chronic pain: Secondary | ICD-10-CM

## 2016-08-21 DIAGNOSIS — M21611 Bunion of right foot: Secondary | ICD-10-CM | POA: Diagnosis not present

## 2016-08-21 DIAGNOSIS — R269 Unspecified abnormalities of gait and mobility: Secondary | ICD-10-CM | POA: Diagnosis not present

## 2016-08-21 DIAGNOSIS — M21612 Bunion of left foot: Secondary | ICD-10-CM

## 2016-08-21 DIAGNOSIS — M25571 Pain in right ankle and joints of right foot: Secondary | ICD-10-CM

## 2016-08-21 NOTE — Assessment & Plan Note (Signed)
Orthotic preparation today  This seems to improve her gait  Use these over the next month and steadily increase use as tolerated  See if we can gradually increase walking  Goal in 3 mos is 5000 steps  RTC 3 mos

## 2016-08-21 NOTE — Assessment & Plan Note (Signed)
When we stabilized her gait with orthotics her ankle pain was much less  We will continue to work to see if this will lessen stress on ankle

## 2016-08-21 NOTE — Assessment & Plan Note (Signed)
With orthotics blocking some of pronation see if we can slow bunion change

## 2016-08-21 NOTE — Progress Notes (Signed)
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 Non smoker  ROS Feels more steady with changes Swelling is slightly left but chronic in both lower legs No hip pain  PE Pleasant F in NAD BP 121/71   Ht 5\' 5"  (1.651 m)   Wt 195 lb (88.5 kg)   BMI 32.45 kg/m   Walking gait without correction shows significant trendelenburg more shift to RT Pes planus bilat Moderate valgus and bunion change bilaterally Sinus tarsi swelling bilat Lymphedema over lower legs with diffuse swelling  Ankle less TTP  Genu valgus RT > Lt  Patient was fitted for a : standard, cushioned, semi-rigid orthotic. The orthotic was heated and afterward the patient stood on the orthotic blank positioned on the orthotic stand. The patient was positioned in subtalar neutral position and 10 degrees of ankle dorsiflexion in a weight bearing stance. After completion of molding, a stable base was applied to the orthotic blank. The blank was ground to a stable position for weight bearing. Size:8 Red EVA Base:blue med. EVA Posting:none Additional orthotic padding: heel wedge RT  Post orthotics she reduced trendelenburg and had significantly improved gait

## 2016-08-30 ENCOUNTER — Encounter: Payer: Medicare PPO | Admitting: Sports Medicine

## 2016-08-31 DIAGNOSIS — N3281 Overactive bladder: Secondary | ICD-10-CM | POA: Diagnosis not present

## 2016-08-31 DIAGNOSIS — Z01411 Encounter for gynecological examination (general) (routine) with abnormal findings: Secondary | ICD-10-CM | POA: Diagnosis not present

## 2016-08-31 DIAGNOSIS — R102 Pelvic and perineal pain: Secondary | ICD-10-CM | POA: Diagnosis not present

## 2016-09-05 ENCOUNTER — Encounter: Payer: Self-pay | Admitting: Physical Therapy

## 2016-09-05 ENCOUNTER — Ambulatory Visit: Payer: Medicare PPO | Attending: Obstetrics & Gynecology | Admitting: Physical Therapy

## 2016-09-05 DIAGNOSIS — R279 Unspecified lack of coordination: Secondary | ICD-10-CM | POA: Diagnosis not present

## 2016-09-05 DIAGNOSIS — M6281 Muscle weakness (generalized): Secondary | ICD-10-CM | POA: Diagnosis not present

## 2016-09-05 DIAGNOSIS — R2689 Other abnormalities of gait and mobility: Secondary | ICD-10-CM

## 2016-09-05 DIAGNOSIS — M25652 Stiffness of left hip, not elsewhere classified: Secondary | ICD-10-CM

## 2016-09-05 NOTE — Therapy (Signed)
[facility] [address] [place], [place], [address] Phone: [phone]   Fax:  [phone]  Physical Therapy Evaluation  Patient Details  Name: Kathy Duran MRN: [id number] Date of Birth: 1945-08-23 Referring Provider: Dr. Myna Hidalgo  Encounter Date: 09/05/2016      PT End of Session - 09/05/16 1545    Visit Number 1   Number of Visits 10   Date for PT Re-Evaluation 10/31/16   PT Start Time 1535   PT Stop Time 1615   PT Time Calculation (min) 40 min   Activity Tolerance Patient tolerated treatment well   Behavior During Therapy [facility] for tasks assessed/performed      Past Medical History:  Diagnosis Date  . Anxiety   . Arthritis    left hip  . Asthma    Inhalers for tx  . Bilateral primary osteoarthritis of knee 07/06/2016   Moderate  . Bilateral swelling of feet    bilateral ankle and feet edema  . Bruises easily   . DDD (degenerative disc disease), lumbar 07/06/2016  . Hypertension   . Myofacial muscle pain 07/06/2016  . Osteoarthritis of both hands 07/06/2016  . Seasonal allergies 07/06/2016  . Sinus complaint   . Sjogren's syndrome (HCC)    dry eyes  . Tenonitis   . Urinary incontinence    occ. an issue wears pads    Past Surgical History:  Procedure Laterality Date  . ABDOMINAL HYSTERECTOMY    . BACK SURGERY    . BREAST SURGERY Right    cyst drainage  . SHOULDER SURGERY Right    right shoulder tendon and rotator cuff repair  . TOTAL HIP ARTHROPLASTY Right 2006  . TOTAL HIP ARTHROPLASTY Left 02/24/2016   Procedure: LEFT TOTAL HIP ARTHROPLASTY ANTERIOR APPROACH;  Surgeon: Kathryne Hitch, MD;  Location: [facility];  Service: Orthopedics;  Laterality: Left;    There were no vitals filed for this visit.       Subjective Assessment - 09/05/16 1539    Subjective Patient reports since she had her left hip done in 02/2016.  Patient reports my gait is uneven and using orthotics to  help. Patient will have a full stream when she gets up in the night to go to the bathroom. Patient wears a large pad at night .  Light stream going from sit to stand and heavy stream from supine to sit to stand.    Patient Stated Goals increase strength to decrease urinary leakage   Currently in Pain? Yes   Pain Score 10-Worst pain ever   Pain Location --  pubic   Pain Orientation Mid   Pain Descriptors / Indicators Sharp   Pain Type Acute pain   Pain Onset More than a month ago   Pain Frequency Intermittent   Aggravating Factors  wakes patient up at night; laying down   Pain Relieving Factors movement   Multiple Pain Sites No            [facility] PT Assessment - 09/05/16 0001      Assessment   Medical Diagnosis R10.2 pelvic pain   Referring Provider Dr. Myna Hidalgo   Onset Date/Surgical Date 02/24/16   Prior Therapy Yes     Precautions   Precautions None     Restrictions   Weight Bearing Restrictions No     Balance Screen   Has the patient fallen in the past 6 months No   Has the patient had a  decrease in activity level because of a fear of falling?  No   Is the patient reluctant to leave their home because of a fear of falling?  No     Home Environment   Living Environment Private residence   Available Help at Discharge Family   Type of Home House   Home Layout One level     Prior Function   Level of Independence Independent   Vocation Retired     IT consultant   Overall Cognitive Status Within Functional Limits for tasks assessed     Observation/Other Assessments   Focus on Therapeutic Outcomes (FOTO)  48% limitation for urinary problem  goal is 48% limitation for urinary problem     Posture/Postural Control   Posture/Postural Control Postural limitations   Postural Limitations Forward head;Rounded Shoulders  hips adducted, decreased bil. hip extension     ROM / Strength   AROM / PROM / Strength AROM;Strength     AROM   Left Hip Extension -5   Lumbar  Extension decreased by 50%     Strength   Right Hip External Rotation  3+/5   Left Hip Extension 3-/5   Left Hip External Rotation 3+/5   Right Knee Extension 4/5   Left Knee Extension 4/5     Palpation   SI assessment  left ilium rotated posteriorly   Palpation comment palpable tenderness located suprapubically, left lower abdomen; decreased mobility of hysterectomy and hip replacement scar decreased in mobility and tender;      Special Tests    Special Tests Hip Special Tests   Hip Special Tests  Trendelenberg Test     Trendelenburg Test   Findings Positive   Side Left   Comments drop right hip     Transfers   Transfers Sit to Stand   Sit to Stand 6: Modified independent (Device/Increase time);With upper extremity assist  goes slowly     Ambulation/Gait   Ambulation/Gait Yes   Gait Pattern Decreased stride length;Decreased hip/knee flexion - left;Decreased dorsiflexion - left;Decreased weight shift to left  decreased left hip extension, left hip stays adducted                 Pelvic Floor Special Questions - 09/05/16 0001    Urinary Leakage Yes   Pad use 1 large pad at night; 1 pad while going out; change during the day 1-3   Activities that cause leaking Walking  sit to stand; supine to sit; steps   Scar Other   Scar other decreased mobility of hysterectomy scar, anterior hip replacement scar on left   External Palpation tenderness locate in ishcibulbocavernosus; mons pubis   Pelvic Floor Internal Exam Patient confirms identification and approves PT to assess pelvic muscle integrity   Exam Type Vaginal   Palpation tenderness located on left obturator internist, left pubococcygeus, left pubococcygeus, left ishiococcygeus, right obturator internist   Strength weak squeeze, no lift          [facility] Adult PT Treatment/Exercise - 09/05/16 0001      Manual Therapy   Manual Therapy Soft tissue mobilization   Soft tissue mobilization left hamstring, left  quadricep, left abdominal wall, left gluteal, left obturator intenist, left quadratus                PT Education - 09/05/16 1647    Education provided Yes   Education Details stretches   Person(s) Educated Patient   Methods Explanation;Demonstration;Verbal cues;Handout   Comprehension Returned demonstration;Verbalized understanding  PT Short Term Goals - 09/05/16 1659      PT SHORT TERM GOAL #1   Title independent with Initial HEP   Time 4   Period Weeks   Status New     PT SHORT TERM GOAL #2   Title pelvic pain with sleeping decreased >/= 25% due to increased tissue mobility   Time 4   Period Weeks   Status New     PT SHORT TERM GOAL #3   Title urinary leakage decreased >/= 25% with transitional movements due to ability to not bear down with pelvic floor contraction   Time 4   Status New     PT SHORT TERM GOAL #4   Title ambulate with increased hip extension and abduction due to increased tissue mobility   Time 4   Period Weeks   Status New           PT Long Term Goals - 09/05/16 1701      PT LONG TERM GOAL #1   Title independent with HEP   Time 8   Period Weeks   Status New     PT LONG TERM GOAL #2   Title urinary leakage decreased >/= 75% due to pelvic floor strength >/= 4/5 due to decreased tightness in pelvic floor muscles   Time 8   Status New     PT LONG TERM GOAL #3   Title not wake up in the middle of the night due to tissue mobility in abdominal and left hip   Time 8   Period Weeks   Status New     PT LONG TERM GOAL #4   Title FOTO score is </= 48% limitation   Time 8   Period Weeks   Status New     PT LONG TERM GOAL #5   Title ---               Plan - 09/05/16 1647    Clinical Impression Statement Patient is a 71 year old female with pelvic pain and urinary leakage after her left THR 02/24/3016.  Patient  reports pelvic pain is 10/10 pain in the purbic bone area when she is laying down at night.  Patient  reports urinary leakage when she goes from supine to sit and sit to stand when going to the bathroom at night. Patient will occasionally leak urine during the day with walking. Patient wears a large pad at night and 1-3 during the day depending if she is going out.  Palpable tenderness located in abdominal muscles, left hip musculature, bilateral obturator internist, left levator ani,gluteal, hamstring and quads.  Patient ambulates with decreased left hip and knee extension and adducts bilateral hips due to muscle tightness.  Pelvic floor strength is 2/5 and will bear down with transitional movements. Suprapubically is tendern and tight where her hysterectomy scar is.  Patient is moderately complex evaluation due to an evolving condition and comorbidities such as recent total hip replacement, urinary leakage in the past, hysterectomy, sjorens syndrome and myofascial pain  that could impact care provided.  Patient will benefit from skilled therapy to improve mobilty and strength to decrease urinary leakage and improve gait.     Rehab Potential Excellent   Clinical Impairments Affecting Rehab Potential right posterior THR 2006; Left anterior hip replacement 02/24/2016; back surgery   PT Frequency 2x / week   PT Duration 8 weeks   PT Treatment/Interventions Dry needling;Taping;Biofeedback;Electrical Stimulation;Cryotherapy;Ultrasound;Moist Heat;Therapeutic activities;Therapeutic exercise;Neuromuscular re-education;Patient/family education;Passive range of motion;Scar  mobilization;Manual techniques   PT Next Visit Plan nustep; soft tissue work to left gluteal, hamstring, iliotibial band, left quads; Left hip strength for abduction, extension and ER; bil. knee extension strength   PT Home Exercise Plan stretches   Recommended Other Services None   Consulted and Agree with Plan of Care Patient      Patient will benefit from skilled therapeutic intervention in order to improve the following deficits and  impairments:  Abnormal gait, Difficulty walking, Pain, Decreased strength, Decreased mobility, Increased muscle spasms, Increased fascial restricitons, Decreased coordination  Visit Diagnosis: Muscle weakness (generalized) - Plan: PT plan of care cert/re-cert  Unspecified lack of coordination - Plan: PT plan of care cert/re-cert  Stiffness of left hip, not elsewhere classified - Plan: PT plan of care cert/re-cert  Other abnormalities of gait and mobility - Plan: PT plan of care cert/re-cert      G-Codes - 2016-10-02 1642    Functional Assessment Tool Used FOTO score is 58% limitation for urinary problem  goal is 485 limitation   Functional Limitation Other PT primary   Other PT Primary Current Status (Q9476) At least 40 percent but less than 60 percent impaired, limited or restricted   Other PT Primary Goal Status (L4650) At least 40 percent but less than 60 percent impaired, limited or restricted       Problem List Patient Active Problem List   Diagnosis Date Noted  . Bilateral bunions 08/21/2016  . Seasonal allergies 07/06/2016  . Asthma 07/06/2016  . Sjogren's syndrome (HCC) 07/06/2016  . DDD (degenerative disc disease), lumbar 07/06/2016  . Bilateral primary osteoarthritis of knee 07/06/2016  . Osteoarthritis of both hands 07/06/2016  . Myofacial muscle pain 07/06/2016  . DDD lumbar spine with spinal stenosis 07/06/2016  . Abnormality of gait 06/26/2016  . Chronic pain of right ankle 06/26/2016  . Osteoarthritis of left hip 02/24/2016  . Status post left hip replacement 02/24/2016  . Varicose veins 04/20/2015  . Lipidemia 04/20/2015  . Rheumatoid arthritis (HCC) 11/20/2013  . Low back pain 11/20/2013  . HTN (hypertension) 11/20/2013  . GERD (gastroesophageal reflux disease) 11/20/2013  . Bladder spasm 11/20/2013  . Hyperlipidemia 11/20/2013  . Status post right hip replacement 11/20/2013  . Bilateral swelling of feet   . Bruises easily     Eulis Foster,  PT Oct 02, 2016 5:07 PM   [facility] [address] [place], [place], [address] Phone: [phone]   Fax:  [phone]  Name: SEONA CLEMENSON MRN: [id number] Date of Birth: 1945/11/22

## 2016-09-05 NOTE — Patient Instructions (Signed)
Chair Sitting    Sit at edge of seat, spine straight, one leg extended. Put a hand on each thigh and bend forward from the hip, keeping spine straight. Allow hand on extended leg to reach toward toes. Support upper body with other arm. Hold _30__ seconds. Repeat _2__ times per session. Do __2_ sessions per day.  Copyright  VHI. All rights reserved.   sitting with knees apart rolling your back forward and back 10 times 2 times per day.  [facility] [address], [address] [place], [place] [address] Phone # [phone] Fax [phone]

## 2016-09-06 ENCOUNTER — Ambulatory Visit: Payer: Medicare PPO | Admitting: Physical Therapy

## 2016-09-06 DIAGNOSIS — R2689 Other abnormalities of gait and mobility: Secondary | ICD-10-CM | POA: Diagnosis not present

## 2016-09-06 DIAGNOSIS — M25652 Stiffness of left hip, not elsewhere classified: Secondary | ICD-10-CM | POA: Diagnosis not present

## 2016-09-06 DIAGNOSIS — R279 Unspecified lack of coordination: Secondary | ICD-10-CM | POA: Diagnosis not present

## 2016-09-06 DIAGNOSIS — M6281 Muscle weakness (generalized): Secondary | ICD-10-CM

## 2016-09-06 NOTE — Therapy (Signed)
[facility] [address] [place], [place], [address] Phone: [phone]   Fax:  [phone]  Physical Therapy Treatment  Patient Details  Name: Kathy Duran MRN: [id number] Date of Birth: 28-May-1946 Referring Provider: Dr. Myna Hidalgo  Encounter Date: 09/06/2016      PT End of Session - 09/06/16 1541    Visit Number 2   Number of Visits 10   Date for PT Re-Evaluation 10/31/16   PT Start Time 1530   PT Stop Time 1610   PT Time Calculation (min) 40 min   Activity Tolerance Patient tolerated treatment well      Past Medical History:  Diagnosis Date  . Anxiety   . Arthritis    left hip  . Asthma    Inhalers for tx  . Bilateral primary osteoarthritis of knee 07/06/2016   Moderate  . Bilateral swelling of feet    bilateral ankle and feet edema  . Bruises easily   . DDD (degenerative disc disease), lumbar 07/06/2016  . Hypertension   . Myofacial muscle pain 07/06/2016  . Osteoarthritis of both hands 07/06/2016  . Seasonal allergies 07/06/2016  . Sinus complaint   . Sjogren's syndrome (HCC)    dry eyes  . Tenonitis   . Urinary incontinence    occ. an issue wears pads    Past Surgical History:  Procedure Laterality Date  . ABDOMINAL HYSTERECTOMY    . BACK SURGERY    . BREAST SURGERY Right    cyst drainage  . SHOULDER SURGERY Right    right shoulder tendon and rotator cuff repair  . TOTAL HIP ARTHROPLASTY Right 2006  . TOTAL HIP ARTHROPLASTY Left 02/24/2016   Procedure: LEFT TOTAL HIP ARTHROPLASTY ANTERIOR APPROACH;  Surgeon: Kathryne Hitch, MD;  Location: [facility];  Service: Orthopedics;  Laterality: Left;    There were no vitals filed for this visit.      Subjective Assessment - 09/06/16 1528    Subjective Patient demonstrates improved "hip opening"/feeling looser.     Currently in Pain? Yes   Pain Score 5    Pain Location Back   Pain Radiating Towards right hip   Pain Onset More  than a month ago   Pain Frequency Intermittent                         [facility] Adult PT Treatment/Exercise - 09/06/16 0001      Therapeutic Activites    Therapeutic Activities ADL's;Other Therapeutic Activities   ADL's walking, standing and transitional movements     Knee/Hip Exercises: Aerobic   Nustep 8 min seat 9 arms/legs     Knee/Hip Exercises: Seated   Long Arc Quad Strengthening;Right;Left;10 reps   Long Arc Quad Limitations red band     Knee/Hip Exercises: Supine   Bridges Strengthening;1 set;10 reps   Heel Prop for Knee Extension Limitations hip extension isometric 10x    Other Supine Knee/Hip Exercises ball squeeze 10x   Other Supine Knee/Hip Exercises red band clams 10x     Knee/Hip Exercises: Sidelying   Clams 10x right/left     Manual Therapy   Soft tissue mobilization foam roll bilateral gluteals, ITB, HS                PT Education - 09/05/16 1647    Education provided Yes   Education Details stretches   Person(s) Educated Patient   Methods Explanation;Demonstration;Verbal cues;Handout   Comprehension Returned demonstration;Verbalized  understanding          PT Short Term Goals - 09/06/16 1625      PT SHORT TERM GOAL #1   Title independent with Initial HEP   Time 4   Period Weeks   Status On-going     PT SHORT TERM GOAL #2   Title pelvic pain with sleeping decreased >/= 25% due to increased tissue mobility   Time 4   Period Weeks   Status On-going     PT SHORT TERM GOAL #3   Title urinary leakage decreased >/= 25% with transitional movements due to ability to not bear down with pelvic floor contraction   Time 4   Period Weeks   Status On-going     PT SHORT TERM GOAL #4   Title ambulate with increased hip extension and abduction due to increased tissue mobility   Time 4   Period Weeks   Status On-going           PT Long Term Goals - 09/06/16 1625      PT LONG TERM GOAL #1   Title independent with HEP    Time 8   Period Weeks   Status On-going     PT LONG TERM GOAL #2   Title urinary leakage decreased >/= 75% due to pelvic floor strength >/= 4/5 due to decreased tightness in pelvic floor muscles   Time 8   Period Weeks   Status On-going     PT LONG TERM GOAL #3   Title not wake up in the middle of the night due to tissue mobility in abdominal and left hip   Time 8   Period Weeks     PT LONG TERM GOAL #4   Title FOTO score is </= 48% limitation   Time 8   Period Weeks   Status On-going               Plan - 09/06/16 1553    Clinical Impression Statement Patient reports improved mobility since last visit.  She has significant weakness in bilateral hip extensors and abductors.    She has tender points in gluteals, ITB and HS.  Improved muscle length following treatment session.  Patient expresses some concern about post exercise soreness and discussed use of moist heat and continued mobility this evening.  Therapist closely monitoring response with all interventions.     PT Next Visit Plan assess response to today's treatment and continue nustep; soft tissue work to left gluteal, hamstring, iliotibial band, left quads; Left hip strength for abduction, extension and ER; bil. knee extension strength      Patient will benefit from skilled therapeutic intervention in order to improve the following deficits and impairments:     Visit Diagnosis: Muscle weakness (generalized)  Unspecified lack of coordination  Stiffness of left hip, not elsewhere classified       G-Codes - September 08, 2016 1642    Functional Assessment Tool Used FOTO score is 58% limitation for urinary problem  goal is 485 limitation   Functional Limitation Other PT primary   Other PT Primary Current Status (N2355) At least 40 percent but less than 60 percent impaired, limited or restricted   Other PT Primary Goal Status (D3220) At least 40 percent but less than 60 percent impaired, limited or restricted       Problem List Patient Active Problem List   Diagnosis Date Noted  . Bilateral bunions 08/21/2016  . Seasonal allergies 07/06/2016  . Asthma 07/06/2016  .  Sjogren's syndrome (HCC) 07/06/2016  . DDD (degenerative disc disease), lumbar 07/06/2016  . Bilateral primary osteoarthritis of knee 07/06/2016  . Osteoarthritis of both hands 07/06/2016  . Myofacial muscle pain 07/06/2016  . DDD lumbar spine with spinal stenosis 07/06/2016  . Abnormality of gait 06/26/2016  . Chronic pain of right ankle 06/26/2016  . Osteoarthritis of left hip 02/24/2016  . Status post left hip replacement 02/24/2016  . Varicose veins 04/20/2015  . Lipidemia 04/20/2015  . Rheumatoid arthritis (HCC) 11/20/2013  . Low back pain 11/20/2013  . HTN (hypertension) 11/20/2013  . GERD (gastroesophageal reflux disease) 11/20/2013  . Bladder spasm 11/20/2013  . Hyperlipidemia 11/20/2013  . Status post right hip replacement 11/20/2013  . Bilateral swelling of feet   . Bruises easily   Lavinia Sharps, PT 09/06/16 4:27 PM Phone: [phone] Fax: [phone]  Vivien Presto 09/06/2016, 4:26 PM  [facility] [address] [place], [place], [address] Phone: [phone]   Fax:  [phone]  Name: BRIANNON BOGGIO MRN: [id number] Date of Birth: 29-Jun-1946

## 2016-09-11 ENCOUNTER — Ambulatory Visit: Payer: Medicare PPO | Admitting: Physical Therapy

## 2016-09-11 DIAGNOSIS — M25652 Stiffness of left hip, not elsewhere classified: Secondary | ICD-10-CM

## 2016-09-11 DIAGNOSIS — M6281 Muscle weakness (generalized): Secondary | ICD-10-CM

## 2016-09-11 DIAGNOSIS — R279 Unspecified lack of coordination: Secondary | ICD-10-CM

## 2016-09-11 DIAGNOSIS — R2689 Other abnormalities of gait and mobility: Secondary | ICD-10-CM | POA: Diagnosis not present

## 2016-09-11 NOTE — Therapy (Signed)
[facility] [address] [place], [place], [address] Phone: [phone]   Fax:  [phone]  Physical Therapy Treatment  Patient Details  Name: Kathy Duran MRN: [id number] Date of Birth: [birth year] Referring Provider: Dr. Myna Hidalgo  Encounter Date: 09/11/2016      PT End of Session - 09/11/16 1207    Visit Number 3   Number of Visits 10   Date for PT Re-Evaluation 10/31/16   PT Start Time 1145   PT Stop Time 1225   PT Time Calculation (min) 40 min   Activity Tolerance Patient tolerated treatment well      Past Medical History:  Diagnosis Date  . Anxiety   . Arthritis    left hip  . Asthma    Inhalers for tx  . Bilateral primary osteoarthritis of knee 07/06/2016   Moderate  . Bilateral swelling of feet    bilateral ankle and feet edema  . Bruises easily   . DDD (degenerative disc disease), lumbar 07/06/2016  . Hypertension   . Myofacial muscle pain 07/06/2016  . Osteoarthritis of both hands 07/06/2016  . Seasonal allergies 07/06/2016  . Sinus complaint   . Sjogren's syndrome (HCC)    dry eyes  . Tenonitis   . Urinary incontinence    occ. an issue wears pads    Past Surgical History:  Procedure Laterality Date  . ABDOMINAL HYSTERECTOMY    . BACK SURGERY    . BREAST SURGERY Right    cyst drainage  . SHOULDER SURGERY Right    right shoulder tendon and rotator cuff repair  . TOTAL HIP ARTHROPLASTY Right 2006  . TOTAL HIP ARTHROPLASTY Left 02/24/2016   Procedure: LEFT TOTAL HIP ARTHROPLASTY ANTERIOR APPROACH;  Surgeon: Kathryne Hitch, MD;  Location: [facility];  Service: Orthopedics;  Laterality: Left;    There were no vitals filed for this visit.      Subjective Assessment - 09/11/16 1147    Subjective Did fine after last treatment, no major soreness;  I don't feel locked anymore.  Some general joint stiffness.   Travelled to [facility] yesterday, rode 3 hours.      Currently in  Pain? No/denies   Pain Score 0-No pain  just stiff                         [facility] Adult PT Treatment/Exercise - 09/11/16 0001      Therapeutic Activites    Therapeutic Activities ADL's;Other Therapeutic Activities   ADL's walking, standing and transitional movements   Other Therapeutic Activities sit to stand     Lumbar Exercises: Supine   Ab Set 10 reps   Isometric Hip Flexion 10 reps     Knee/Hip Exercises: Aerobic   Nustep 11 min seat 9 arms/legs     Knee/Hip Exercises: Seated   Long Arc Quad Strengthening;Right;Left;10 reps   Long Arc Quad Limitations red band   Knee/Hip Flexion red band 10x right/left   Sit to [employer] 10 reps  from 2 pillow     Knee/Hip Exercises: Supine   Bridges Strengthening;1 set;10 reps  on wedge   Other Supine Knee/Hip Exercises ball squeeze 10x  with wedge behind back   Other Supine Knee/Hip Exercises red band clams 10x  from wedge     Knee/Hip Exercises: Sidelying   Clams 12x right/left  PT Short Term Goals - 09/11/16 1245      PT SHORT TERM GOAL #1   Title independent with Initial HEP   Time 4   Period Weeks   Status On-going     PT SHORT TERM GOAL #2   Title pelvic pain with sleeping decreased >/= 25% due to increased tissue mobility   Time 4   Period Weeks   Status On-going     PT SHORT TERM GOAL #3   Title urinary leakage decreased >/= 25% with transitional movements due to ability to not bear down with pelvic floor contraction   Time 4   Period Weeks   Status On-going     PT SHORT TERM GOAL #4   Title ambulate with increased hip extension and abduction due to increased tissue mobility   Time 4   Period Weeks   Status On-going           PT Long Term Goals - 09/11/16 1246      PT LONG TERM GOAL #1   Title independent with HEP   Time 8   Period Weeks   Status On-going     PT LONG TERM GOAL #2   Title urinary leakage decreased >/= 75% due to pelvic floor strength >/=  4/5 due to decreased tightness in pelvic floor muscles   Time 8   Period Weeks   Status On-going     PT LONG TERM GOAL #3   Title not wake up in the middle of the night due to tissue mobility in abdominal and left hip   Time 8   Period Weeks   Status On-going     PT LONG TERM GOAL #4   Title FOTO score is </= 48% limitation   Time 8   Period Weeks   Status On-going               Plan - 09/11/16 1220    Clinical Impression Statement The patient reports no pain just stiffness from riding in the car yesterday a long time.  She reports improved hip mobility and decreased sensation of "locking."  Improved gluteus medius activation and transverse abdominal.  No complaints of pain produced with exercise and able to participate in a progression of intensity.  On target to meet STGs.  Therapist closely monitoring response with all.     PT Next Visit Plan pelvic floor; continue nustep; soft tissue work to left gluteal, hamstring, iliotibial band, left quads; Left hip strength for abduction, extension and ER; bil. knee extension strength      Patient will benefit from skilled therapeutic intervention in order to improve the following deficits and impairments:     Visit Diagnosis: Muscle weakness (generalized)  Unspecified lack of coordination  Stiffness of left hip, not elsewhere classified  Other abnormalities of gait and mobility     Problem List Patient Active Problem List   Diagnosis Date Noted  . Bilateral bunions 08/21/2016  . Seasonal allergies 07/06/2016  . Asthma 07/06/2016  . Sjogren's syndrome (HCC) 07/06/2016  . DDD (degenerative disc disease), lumbar 07/06/2016  . Bilateral primary osteoarthritis of knee 07/06/2016  . Osteoarthritis of both hands 07/06/2016  . Myofacial muscle pain 07/06/2016  . DDD lumbar spine with spinal stenosis 07/06/2016  . Abnormality of gait 06/26/2016  . Chronic pain of right ankle 06/26/2016  . Osteoarthritis of left hip  02/24/2016  . Status post left hip replacement 02/24/2016  . Varicose veins 04/20/2015  . Lipidemia 04/20/2015  .  Rheumatoid arthritis (HCC) 11/20/2013  . Low back pain 11/20/2013  . HTN (hypertension) 11/20/2013  . GERD (gastroesophageal reflux disease) 11/20/2013  . Bladder spasm 11/20/2013  . Hyperlipidemia 11/20/2013  . Status post right hip replacement 11/20/2013  . Bilateral swelling of feet   . Bruises easily    Lavinia Sharps, PT 09/11/16 12:48 PM Phone: [phone] Fax: [phone]  Vivien Presto 09/11/2016, 12:48 PM  [facility] [address] [place], [place], [address] Phone: [phone]   Fax:  [phone]  Name: Kathy Duran MRN: [id number] Date of Birth: Dec 21, 1945

## 2016-09-13 ENCOUNTER — Ambulatory Visit: Payer: Medicare PPO | Admitting: Physical Therapy

## 2016-09-13 ENCOUNTER — Encounter: Payer: Self-pay | Admitting: Physical Therapy

## 2016-09-13 DIAGNOSIS — R2689 Other abnormalities of gait and mobility: Secondary | ICD-10-CM

## 2016-09-13 DIAGNOSIS — M6281 Muscle weakness (generalized): Secondary | ICD-10-CM

## 2016-09-13 DIAGNOSIS — R279 Unspecified lack of coordination: Secondary | ICD-10-CM

## 2016-09-13 DIAGNOSIS — M25652 Stiffness of left hip, not elsewhere classified: Secondary | ICD-10-CM | POA: Diagnosis not present

## 2016-09-13 NOTE — Therapy (Signed)
[facility] [address] [place], [place], [address] Phone: [phone]   Fax:  [phone]  Physical Therapy Treatment  Patient Details  Name: Kathy Duran MRN: [id number] Date of Birth: 1945-11-26 Referring Provider: Dr. Myna Hidalgo  Encounter Date: 09/13/2016      PT End of Session - 09/13/16 0940    Visit Number 4   Number of Visits 10   Date for PT Re-Evaluation 10/31/16   PT Start Time 0930   PT Stop Time 1010   PT Time Calculation (min) 40 min   Activity Tolerance Patient tolerated treatment well   Behavior During Therapy [facility] for tasks assessed/performed      Past Medical History:  Diagnosis Date  . Anxiety   . Arthritis    left hip  . Asthma    Inhalers for tx  . Bilateral primary osteoarthritis of knee 07/06/2016   Moderate  . Bilateral swelling of feet    bilateral ankle and feet edema  . Bruises easily   . DDD (degenerative disc disease), lumbar 07/06/2016  . Hypertension   . Myofacial muscle pain 07/06/2016  . Osteoarthritis of both hands 07/06/2016  . Seasonal allergies 07/06/2016  . Sinus complaint   . Sjogren's syndrome (HCC)    dry eyes  . Tenonitis   . Urinary incontinence    occ. an issue wears pads    Past Surgical History:  Procedure Laterality Date  . ABDOMINAL HYSTERECTOMY    . BACK SURGERY    . BREAST SURGERY Right    cyst drainage  . SHOULDER SURGERY Right    right shoulder tendon and rotator cuff repair  . TOTAL HIP ARTHROPLASTY Right 2006  . TOTAL HIP ARTHROPLASTY Left 02/24/2016   Procedure: LEFT TOTAL HIP ARTHROPLASTY ANTERIOR APPROACH;  Surgeon: Kathryne Hitch, MD;  Location: [facility];  Service: Orthopedics;  Laterality: Left;    There were no vitals filed for this visit.      Subjective Assessment - 09/13/16 0937    Subjective I feel okay. I feel stronger. I get up and down from the chair then walking with increased confidence. I have not lst a  full bladder before I get to the bathroom. Patient wakes up every 2 hours due to urgency to go.    Patient Stated Goals increase strength to decrease urinary leakage   Currently in Pain? No/denies                      Pelvic Floor Special Questions - 09/13/16 0001    Pelvic Floor Internal Exam Patient confirms identification and approves PT to assess pelvic muscle integrity   Exam Type Vaginal           [facility] Adult PT Treatment/Exercise - 09/13/16 0001      Lumbar Exercises: Supine   Bridge 15 reps   Bridge Limitations red band around knees with clam   Other Supine Lumbar Exercises alternate palm to knee 10x     Knee/Hip Exercises: Aerobic   Nustep 11 min seat 9 arms/legs     Knee/Hip Exercises: Prone   Hip Extension Strengthening;Right;Left;3 sets;10 reps;Limitations   Hip Extension Limitations 1 pillow under hips; tactile cues to prevent raising pelvis     Manual Therapy   Manual Therapy Internal Pelvic Floor;Soft tissue mobilization;Manual Lymphatic Drainage (MLD)   Soft tissue mobilization left quads; left iliotibial band; left hip   Manual Lymphatic Drainage (MLD) left thigh lymphatic system to  reduce swelling   Internal Pelvic Floor left obturator internist; left puborectalis; left ischipuborectalis                PT Education - 09/13/16 1013    Education provided Yes   Education Details pelvic floor contraction    Person(s) Educated Patient   Methods Explanation;Demonstration;Handout   Comprehension Returned demonstration;Verbalized understanding          PT Short Term Goals - 09/11/16 1245      PT SHORT TERM GOAL #1   Title independent with Initial HEP   Time 4   Period Weeks   Status On-going     PT SHORT TERM GOAL #2   Title pelvic pain with sleeping decreased >/= 25% due to increased tissue mobility   Time 4   Period Weeks   Status On-going     PT SHORT TERM GOAL #3   Title urinary leakage decreased >/= 25% with  transitional movements due to ability to not bear down with pelvic floor contraction   Time 4   Period Weeks   Status On-going     PT SHORT TERM GOAL #4   Title ambulate with increased hip extension and abduction due to increased tissue mobility   Time 4   Period Weeks   Status On-going           PT Long Term Goals - 09/11/16 1246      PT LONG TERM GOAL #1   Title independent with HEP   Time 8   Period Weeks   Status On-going     PT LONG TERM GOAL #2   Title urinary leakage decreased >/= 75% due to pelvic floor strength >/= 4/5 due to decreased tightness in pelvic floor muscles   Time 8   Period Weeks   Status On-going     PT LONG TERM GOAL #3   Title not wake up in the middle of the night due to tissue mobility in abdominal and left hip   Time 8   Period Weeks   Status On-going     PT LONG TERM GOAL #4   Title FOTO score is </= 48% limitation   Time 8   Period Weeks   Status On-going               Plan - 09/13/16 1013    Clinical Impression Statement Patient is able to contract the pelvic floor with abominal contraction with tactile cues but needs cues to not contract the gluteals.  Patient is not having the gush with urinary leakage.  Patient had increased left hip pain due to going up and down stairs without railing. Patient had less swelling in left hip.  Patient is able to pivot to the left with less steps.  Patient will benefit from skilled therapy to reduce pain and improve strength.    Rehab Potential Excellent   Clinical Impairments Affecting Rehab Potential right posterior THR 2006; Left anterior hip replacement 02/24/2016; back surgery   PT Frequency 2x / week   PT Duration 8 weeks   PT Treatment/Interventions Dry needling;Taping;Biofeedback;Electrical Stimulation;Cryotherapy;Ultrasound;Moist Heat;Therapeutic activities;Therapeutic exercise;Neuromuscular re-education;Patient/family education;Passive range of motion;Scar mobilization;Manual techniques    PT Next Visit Plan pelvic floor; continue nustep; soft tissue work to left gluteal, hamstring, iliotibial band, left quads; Left hip strength for abduction, extension and ER; bil. knee extension strength   PT Home Exercise Plan progress as needed   Consulted and Agree with Plan of Care Patient  Patient will benefit from skilled therapeutic intervention in order to improve the following deficits and impairments:  Abnormal gait, Difficulty walking, Pain, Decreased strength, Decreased mobility, Increased muscle spasms, Increased fascial restricitons, Decreased coordination  Visit Diagnosis: Muscle weakness (generalized)  Unspecified lack of coordination  Stiffness of left hip, not elsewhere classified  Other abnormalities of gait and mobility     Problem List Patient Active Problem List   Diagnosis Date Noted  . Bilateral bunions 08/21/2016  . Seasonal allergies 07/06/2016  . Asthma 07/06/2016  . Sjogren's syndrome ([place]) 07/06/2016  . DDD (degenerative disc disease), lumbar 07/06/2016  . Bilateral primary osteoarthritis of knee 07/06/2016  . Osteoarthritis of both hands 07/06/2016  . Myofacial muscle pain 07/06/2016  . DDD lumbar spine with spinal stenosis 07/06/2016  . Abnormality of gait 06/26/2016  . Chronic pain of right ankle 06/26/2016  . Osteoarthritis of left hip 02/24/2016  . Status post left hip replacement 02/24/2016  . Varicose veins 04/20/2015  . Lipidemia 04/20/2015  . Rheumatoid arthritis ([place]) 11/20/2013  . Low back pain 11/20/2013  . HTN (hypertension) 11/20/2013  . GERD (gastroesophageal reflux disease) 11/20/2013  . Bladder spasm 11/20/2013  . Hyperlipidemia 11/20/2013  . Status post right hip replacement 11/20/2013  . Bilateral swelling of feet   . Bruises easily     Earlie Counts, PT 09/13/16 10:19 AM   [facility] [address], [place], [place], [address] Phone: [phone]    Fax:  [phone]  Name: Kathy Duran MRN: [id number] Date of Birth: 27-Jun-1946

## 2016-09-13 NOTE — Patient Instructions (Addendum)
Quick Contraction: Gravity Eliminated (Hook-Lying)    Lie with hips and knees bent. Quickly squeeze then fully relax pelvic floor. Perform __1_ sets of __5_. Rest for _1__ seconds between sets. Do _3__ times a day.   Copyright  VHI. All rights reserved.   Slow Contraction: Gravity Eliminated (Hook-Lying)    Lie with hips and knees bent. Slowly squeeze pelvic floor for __5_ seconds. Rest for 5___ seconds. Repeat _10__ times. Do _3__ times a day.   Copyright  VHI. All rights reserved.  [facility] [address], [address] [place], [place] [address] Phone # [phone] Fax [phone]  

## 2016-09-18 ENCOUNTER — Ambulatory Visit: Payer: Medicare PPO | Admitting: Physical Therapy

## 2016-09-18 ENCOUNTER — Other Ambulatory Visit: Payer: Self-pay | Admitting: *Deleted

## 2016-09-18 DIAGNOSIS — M6281 Muscle weakness (generalized): Secondary | ICD-10-CM

## 2016-09-18 DIAGNOSIS — M25652 Stiffness of left hip, not elsewhere classified: Secondary | ICD-10-CM | POA: Diagnosis not present

## 2016-09-18 DIAGNOSIS — M3501 Sicca syndrome with keratoconjunctivitis: Secondary | ICD-10-CM | POA: Diagnosis not present

## 2016-09-18 DIAGNOSIS — R279 Unspecified lack of coordination: Secondary | ICD-10-CM | POA: Diagnosis not present

## 2016-09-18 DIAGNOSIS — R2689 Other abnormalities of gait and mobility: Secondary | ICD-10-CM | POA: Diagnosis not present

## 2016-09-18 LAB — CBC WITH DIFFERENTIAL/PLATELET
Basophils Absolute: 49 cells/uL (ref 0–200)
Basophils Relative: 1 %
Eosinophils Absolute: 245 cells/uL (ref 15–500)
Eosinophils Relative: 5 %
HCT: 40.7 % (ref 35.0–45.0)
Hemoglobin: 13.7 g/dL (ref 11.7–15.5)
Lymphocytes Relative: 45 %
Lymphs Abs: 2205 cells/uL (ref 850–3900)
MCH: 30.1 pg (ref 27.0–33.0)
MCHC: 33.7 g/dL (ref 32.0–36.0)
MCV: 89.5 fL (ref 80.0–100.0)
MPV: 9.8 fL (ref 7.5–12.5)
Monocytes Absolute: 392 cells/uL (ref 200–950)
Monocytes Relative: 8 %
Neutro Abs: 2009 cells/uL (ref 1500–7800)
Neutrophils Relative %: 41 %
Platelets: 311 10*3/uL (ref 140–400)
RBC: 4.55 MIL/uL (ref 3.80–5.10)
RDW: 14.4 % (ref 11.0–15.0)
WBC: 4.9 10*3/uL (ref 3.8–10.8)

## 2016-09-18 LAB — URINALYSIS, ROUTINE W REFLEX MICROSCOPIC
Bilirubin Urine: NEGATIVE
Glucose, UA: NEGATIVE
Hgb urine dipstick: NEGATIVE
Ketones, ur: NEGATIVE
Leukocytes, UA: NEGATIVE
Nitrite: NEGATIVE
Protein, ur: NEGATIVE
Specific Gravity, Urine: 1.006 (ref 1.001–1.035)
pH: 6.5 (ref 5.0–8.0)

## 2016-09-18 NOTE — Therapy (Signed)
[facility] [address], [address] [place], [place], [address] Phone: [phone]   Fax:  [phone]  Physical Therapy Treatment  Patient Details  Name: Kathy Duran MRN: [id number] Date of Birth: [birth year] Referring Provider: Dr. Myna Hidalgo  Encounter Date: 09/18/2016      PT End of Session - 09/18/16 1221    Visit Number 5   Number of Visits 10   Date for PT Re-Evaluation 10/31/16   PT Start Time 1146   PT Stop Time 1228   PT Time Calculation (min) 42 min   Activity Tolerance Patient tolerated treatment well      Past Medical History:  Diagnosis Date  . Anxiety   . Arthritis    left hip  . Asthma    Inhalers for tx  . Bilateral primary osteoarthritis of knee 07/06/2016   Moderate  . Bilateral swelling of feet    bilateral ankle and feet edema  . Bruises easily   . DDD (degenerative disc disease), lumbar 07/06/2016  . Hypertension   . Myofacial muscle pain 07/06/2016  . Osteoarthritis of both hands 07/06/2016  . Seasonal allergies 07/06/2016  . Sinus complaint   . Sjogren's syndrome (HCC)    dry eyes  . Tenonitis   . Urinary incontinence    occ. an issue wears pads    Past Surgical History:  Procedure Laterality Date  . ABDOMINAL HYSTERECTOMY    . BACK SURGERY    . BREAST SURGERY Right    cyst drainage  . SHOULDER SURGERY Right    right shoulder tendon and rotator cuff repair  . TOTAL HIP ARTHROPLASTY Right 2006  . TOTAL HIP ARTHROPLASTY Left 02/24/2016   Procedure: LEFT TOTAL HIP ARTHROPLASTY ANTERIOR APPROACH;  Surgeon: Kathryne Hitch, MD;  Location: [facility];  Service: Orthopedics;  Laterality: Left;    There were no vitals filed for this visit.      Subjective Assessment - 09/18/16 1148    Subjective I spent time with family and friends over the weekend.  Feeling fair.  Reports mild to moderate bilateral knee pain.  Has some muscle soreness post therapy sessions.     Currently in  Pain? Yes   Pain Score 2    Pain Location Knee   Pain Orientation Right;Left   Pain Type Chronic pain                         [facility] Adult PT Treatment/Exercise - 09/18/16 0001      Therapeutic Activites    ADL's walking, standing and transitional movements   Other Therapeutic Activities sit to stand     Knee/Hip Exercises: Stretches   Other Knee/Hip Stretches seated hip adductor and rotator hip stretch 2x 30 sec   Other Knee/Hip Stretches doorway psoas stretch 3x 5 right/left     Knee/Hip Exercises: Aerobic   Nustep 11 min seat 9 arms/legs     Knee/Hip Exercises: Seated   Long Arc Quad Strengthening;Right;Left;10 reps   Long Arc Quad Limitations red band   Knee/Hip Flexion red band 10x right/left   Sit to [employer] --  from high table no hands 10x     Knee/Hip Exercises: Supine   Bridges with [employer] Strengthening;10 reps   Heel Prop for Knee Extension Limitations hip extension isometric 10x    Other Supine Knee/Hip Exercises abdominal brace with hand to opposite knee 10x     Knee/Hip Exercises: Sidelying   Clams  with red band 10x each                  PT Short Term Goals - 09/18/16 1249      PT SHORT TERM GOAL #1   Title independent with Initial HEP   Status Achieved     PT SHORT TERM GOAL #2   Title pelvic pain with sleeping decreased >/= 25% due to increased tissue mobility   Time 4   Period Weeks   Status On-going     PT SHORT TERM GOAL #3   Title urinary leakage decreased >/= 25% with transitional movements due to ability to not bear down with pelvic floor contraction   Time 4   Period Weeks   Status On-going     PT SHORT TERM GOAL #4   Title ambulate with increased hip extension and abduction due to increased tissue mobility   Time 4   Period Weeks   Status On-going           PT Long Term Goals - 09/18/16 1249      PT LONG TERM GOAL #1   Title independent with HEP   Time 8   Period Weeks   Status On-going      PT LONG TERM GOAL #2   Title urinary leakage decreased >/= 75% due to pelvic floor strength >/= 4/5 due to decreased tightness in pelvic floor muscles   Time 8   Period Weeks   Status On-going     PT LONG TERM GOAL #3   Title not wake up in the middle of the night due to tissue mobility in abdominal and left hip   Time 8   Period Weeks   Status On-going     PT LONG TERM GOAL #4   Title FOTO score is </= 48% limitation   Time 8   Period Weeks   Status On-going               Plan - 09/18/16 1223    Clinical Impression Statement The patient is progressing with hip mobility although decreased hip flexor lengths persist (left more than right.)  Genu valgus bilaterally also impacts stride length.  No complaints of increased knee pain with exercise although she reports stiffness.  Improving with glute medius activation but quick muscle fatigue noted.     PT Next Visit Plan pelvic floor; continue nustep; soft tissue work to left gluteal, hamstring, iliotibial band, left quads; Left hip strength for abduction, extension and ER; bil. knee extension strength      Patient will benefit from skilled therapeutic intervention in order to improve the following deficits and impairments:     Visit Diagnosis: Muscle weakness (generalized)  Unspecified lack of coordination  Stiffness of left hip, not elsewhere classified  Other abnormalities of gait and mobility     Problem List Patient Active Problem List   Diagnosis Date Noted  . Bilateral bunions 08/21/2016  . Seasonal allergies 07/06/2016  . Asthma 07/06/2016  . Sjogren's syndrome (HCC) 07/06/2016  . DDD (degenerative disc disease), lumbar 07/06/2016  . Bilateral primary osteoarthritis of knee 07/06/2016  . Osteoarthritis of both hands 07/06/2016  . Myofacial muscle pain 07/06/2016  . DDD lumbar spine with spinal stenosis 07/06/2016  . Abnormality of gait 06/26/2016  . Chronic pain of right ankle 06/26/2016  .  Osteoarthritis of left hip 02/24/2016  . Status post left hip replacement 02/24/2016  . Varicose veins 04/20/2015  . Lipidemia 04/20/2015  . Rheumatoid  arthritis (HCC) 11/20/2013  . Low back pain 11/20/2013  . HTN (hypertension) 11/20/2013  . GERD (gastroesophageal reflux disease) 11/20/2013  . Bladder spasm 11/20/2013  . Hyperlipidemia 11/20/2013  . Status post right hip replacement 11/20/2013  . Bilateral swelling of feet   . Bruises easily    Lavinia Sharps, PT 09/18/16 12:51 PM Phone: [phone] Fax: [phone]  Vivien Presto 09/18/2016, 12:51 PM  [facility] [address], [address] [place], [place], [address] Phone: [phone]   Fax:  [phone]  Name: Kathy Duran MRN: [id number] Date of Birth: [birth year]

## 2016-09-19 LAB — COMPLETE METABOLIC PANEL WITH GFR
ALT: 23 U/L (ref 6–29)
AST: 22 U/L (ref 10–35)
Albumin: 4.5 g/dL (ref 3.6–5.1)
Alkaline Phosphatase: 99 U/L (ref 33–130)
BUN: 15 mg/dL (ref 7–25)
CO2: 22 mmol/L (ref 20–31)
Calcium: 10.1 mg/dL (ref 8.6–10.4)
Chloride: 104 mmol/L (ref 98–110)
Creat: 0.71 mg/dL (ref 0.50–0.99)
GFR, Est African American: >89 mL/min (ref >=60)
GFR, Est Non African American: 88 mL/min (ref >=60)
Glucose, Bld: 82 mg/dL (ref 65–99)
Potassium: 4.3 mmol/L (ref 3.5–5.3)
Sodium: 141 mmol/L (ref 135–146)
Total Bilirubin: 0.4 mg/dL (ref 0.2–1.2)
Total Protein: 7 g/dL (ref 6.1–8.1)

## 2016-09-19 LAB — RHEUMATOID FACTOR: Rhuematoid fact SerPl-aCnc: <14 IU/mL (ref <14)

## 2016-09-20 ENCOUNTER — Ambulatory Visit: Payer: Medicare PPO | Admitting: Physical Therapy

## 2016-09-20 LAB — PROTEIN ELECTROPHORESIS, SERUM
Albumin ELP: 4.3 g/dL (ref 3.8–4.8)
Alpha-1-Globulin: 0.3 g/dL (ref 0.2–0.3)
Alpha-2-Globulin: 0.8 g/dL (ref 0.5–0.9)
Beta 2: 0.4 g/dL (ref 0.2–0.5)
Beta Globulin: 0.5 g/dL (ref 0.4–0.6)
Gamma Globulin: 0.8 g/dL (ref 0.8–1.7)
Total Protein, Serum Electrophoresis: 7 g/dL (ref 6.1–8.1)

## 2016-09-25 ENCOUNTER — Ambulatory Visit: Payer: Medicare PPO | Attending: Obstetrics & Gynecology | Admitting: Physical Therapy

## 2016-09-25 DIAGNOSIS — R2689 Other abnormalities of gait and mobility: Secondary | ICD-10-CM | POA: Insufficient documentation

## 2016-09-25 DIAGNOSIS — R279 Unspecified lack of coordination: Secondary | ICD-10-CM

## 2016-09-25 DIAGNOSIS — M25652 Stiffness of left hip, not elsewhere classified: Secondary | ICD-10-CM | POA: Insufficient documentation

## 2016-09-25 DIAGNOSIS — M6281 Muscle weakness (generalized): Secondary | ICD-10-CM | POA: Insufficient documentation

## 2016-09-25 NOTE — Therapy (Signed)
[facility] [address] [place], [place], [address] Phone: [phone]   Fax:  [phone]  Physical Therapy Treatment  Patient Details  Name: Kathy Duran MRN: [id number] Date of Birth: 1945/12/21 Referring Provider: Dr. Myna Hidalgo  Encounter Date: 09/25/2016      PT End of Session - 09/25/16 1216    Visit Number 6   Number of Visits 10   Date for PT Re-Evaluation 10/31/16   PT Start Time 1145   PT Stop Time 1228   PT Time Calculation (min) 43 min   Activity Tolerance Patient tolerated treatment well      Past Medical History:  Diagnosis Date  . Anxiety   . Arthritis    left hip  . Asthma    Inhalers for tx  . Bilateral primary osteoarthritis of knee 07/06/2016   Moderate  . Bilateral swelling of feet    bilateral ankle and feet edema  . Bruises easily   . DDD (degenerative disc disease), lumbar 07/06/2016  . Hypertension   . Myofacial muscle pain 07/06/2016  . Osteoarthritis of both hands 07/06/2016  . Seasonal allergies 07/06/2016  . Sinus complaint   . Sjogren's syndrome (HCC)    dry eyes  . Tenonitis   . Urinary incontinence    occ. an issue wears pads    Past Surgical History:  Procedure Laterality Date  . ABDOMINAL HYSTERECTOMY    . BACK SURGERY    . BREAST SURGERY Right    cyst drainage  . SHOULDER SURGERY Right    right shoulder tendon and rotator cuff repair  . TOTAL HIP ARTHROPLASTY Right 2006  . TOTAL HIP ARTHROPLASTY Left 02/24/2016   Procedure: LEFT TOTAL HIP ARTHROPLASTY ANTERIOR APPROACH;  Surgeon: Kathryne Hitch, MD;  Location: [facility];  Service: Orthopedics;  Laterality: Left;    There were no vitals filed for this visit.      Subjective Assessment - 09/25/16 1152    Subjective Had a good weekend.  No pain today but did have some bilateral lateral hip pain over the weekend which she attributes to being in a cold environment too long.  Had some soreness after  last visit but nothing I couldn't work through.     Currently in Pain? No/denies   Pain Score 0-No pain   Pain Onset More than a month ago   Pain Frequency Intermittent   Aggravating Factors  cold environment;  knee wakes up at night   Pain Relieving Factors rubbing cream                         [facility] Adult PT Treatment/Exercise - 09/25/16 0001      Therapeutic Activites    ADL's walking, standing and transitional movements     Neuro Re-ed    Neuro Re-ed Details  transverse abdom activation;  gluteal activation     Knee/Hip Exercises: Stretches   Other Knee/Hip Stretches seated hip adductor and rotator hip stretch 2x 30 sec     Knee/Hip Exercises: Aerobic   Nustep 10 min seat 9 arms 10     Knee/Hip Exercises: Standing   Other Standing Knee Exercises glut squeeze with wall slides 10x   Other Standing Knee Exercises wall climbs UE/LE 10x     Knee/Hip Exercises: Seated   Long Arc Quad Strengthening;Right;Left;10 reps   Long Arc Quad Limitations red band   Knee/Hip Flexion red band 10x right/left  Knee/Hip Exercises: Supine   Bridges AROM;Both   Bridges Limitations over red ball   Bridges with [employer] AROM;10 reps   Other Supine Knee/Hip Exercises red ball roll for hip/knee flexion 15x     Modalities   Modalities Moist Heat     Moist Heat Therapy   Number Minutes Moist Heat 10 Minutes   Moist Heat Location --  bil hips                  PT Short Term Goals - 09/25/16 1356      PT SHORT TERM GOAL #1   Title independent with Initial HEP   Status Achieved     PT SHORT TERM GOAL #2   Title pelvic pain with sleeping decreased >/= 25% due to increased tissue mobility   Time 4   Period Weeks   Status On-going     PT SHORT TERM GOAL #3   Title urinary leakage decreased >/= 25% with transitional movements due to ability to not bear down with pelvic floor contraction   Time 4   Period Weeks   Status On-going     PT SHORT TERM GOAL #4    Title ambulate with increased hip extension and abduction due to increased tissue mobility   Time 4   Period Weeks   Status On-going           PT Long Term Goals - 09/25/16 1356      PT LONG TERM GOAL #1   Title independent with HEP   Time 8   Period Weeks   Status On-going     PT LONG TERM GOAL #2   Title urinary leakage decreased >/= 75% due to pelvic floor strength >/= 4/5 due to decreased tightness in pelvic floor muscles   Time 8   Period Weeks   Status On-going     PT LONG TERM GOAL #3   Title not wake up in the middle of the night due to tissue mobility in abdominal and left hip   Time 8   Period Weeks   Status On-going     PT LONG TERM GOAL #4   Title FOTO score is </= 48% limitation   Time 8   Period Weeks   Status On-going               Plan - 09/25/16 1217    Clinical Impression Statement The patient reports muscular fatigue in bilateral LEs but no pain in hips or knees.  Needs verbal cues to activate transverse abdominus muscles without doing a pelvic tilt.   Therapist closely monitoring response throughout treatment session.  On track to meet short term goals.     PT Next Visit Plan pelvic floor; continue nustep; soft tissue work to left gluteal, hamstring, iliotibial band, left quads; Left hip strength for abduction, extension and ER; bil. knee extension strength      Patient will benefit from skilled therapeutic intervention in order to improve the following deficits and impairments:     Visit Diagnosis: Muscle weakness (generalized)  Unspecified lack of coordination  Stiffness of left hip, not elsewhere classified     Problem List Patient Active Problem List   Diagnosis Date Noted  . Bilateral bunions 08/21/2016  . Seasonal allergies 07/06/2016  . Asthma 07/06/2016  . Sjogren's syndrome (HCC) 07/06/2016  . DDD (degenerative disc disease), lumbar 07/06/2016  . Bilateral primary osteoarthritis of knee 07/06/2016  .  Osteoarthritis of both hands 07/06/2016  . Myofacial  muscle pain 07/06/2016  . DDD lumbar spine with spinal stenosis 07/06/2016  . Abnormality of gait 06/26/2016  . Chronic pain of right ankle 06/26/2016  . Osteoarthritis of left hip 02/24/2016  . Status post left hip replacement 02/24/2016  . Varicose veins 04/20/2015  . Lipidemia 04/20/2015  . Rheumatoid arthritis (HCC) 11/20/2013  . Low back pain 11/20/2013  . HTN (hypertension) 11/20/2013  . GERD (gastroesophageal reflux disease) 11/20/2013  . Bladder spasm 11/20/2013  . Hyperlipidemia 11/20/2013  . Status post right hip replacement 11/20/2013  . Bilateral swelling of feet   . Bruises easily    Lavinia Sharps, PT 09/25/16 1:59 PM Phone: [phone] Fax: [phone]  Vivien Presto 09/25/2016, 1:58 PM  [facility] [address], [address] [place], [place], [address] Phone: [phone]   Fax:  [phone]  Name: DAYANNA PRYCE MRN: [id number] Date of Birth: [birth year]

## 2016-09-27 ENCOUNTER — Ambulatory Visit: Payer: Medicare PPO | Admitting: Physical Therapy

## 2016-09-27 ENCOUNTER — Encounter: Payer: Self-pay | Admitting: Physical Therapy

## 2016-09-27 DIAGNOSIS — M6281 Muscle weakness (generalized): Secondary | ICD-10-CM

## 2016-09-27 DIAGNOSIS — M25652 Stiffness of left hip, not elsewhere classified: Secondary | ICD-10-CM

## 2016-09-27 DIAGNOSIS — R279 Unspecified lack of coordination: Secondary | ICD-10-CM | POA: Diagnosis not present

## 2016-09-27 DIAGNOSIS — R2689 Other abnormalities of gait and mobility: Secondary | ICD-10-CM | POA: Diagnosis not present

## 2016-09-27 NOTE — Patient Instructions (Signed)
Cough: Phase 2 (Supine)    Lie flat. Squeeze pelvic floor and hold. Inhale. Lift head and shoulders. Exhale. Relax. Repeat _10__ times. Do _2__ times a day.   Copyright  VHI. All rights reserved.   Sit to Stand: Phase 1    Sitting, squeeze pelvic floor and hold. Lean trunk forward. Repeat 10___ times. Do _4__ times a day. Do not hold your breath Copyright  VHI. All rights reserved.  [facility] [address], [address] [place], [place] [address] Phone # [phone] Fax [phone]

## 2016-09-27 NOTE — Therapy (Signed)
[facility] [address], [address] [place], [place], [address] Phone: [phone]   Fax:  [phone]  Physical Therapy Treatment  Patient Details  Name: Kathy Duran MRN: [id number] Date of Birth: [birth year] Referring Provider: Dr. Myna Hidalgo  Encounter Date: 09/27/2016      PT End of Session - 09/27/16 1501    Visit Number 7   Number of Visits 10   Date for PT Re-Evaluation 10/31/16   PT Start Time 1445   PT Stop Time 1525   PT Time Calculation (min) 40 min   Activity Tolerance Patient tolerated treatment well   Behavior During Therapy [facility] for tasks assessed/performed      Past Medical History:  Diagnosis Date  . Anxiety   . Arthritis    left hip  . Asthma    Inhalers for tx  . Bilateral primary osteoarthritis of knee 07/06/2016   Moderate  . Bilateral swelling of feet    bilateral ankle and feet edema  . Bruises easily   . DDD (degenerative disc disease), lumbar 07/06/2016  . Hypertension   . Myofacial muscle pain 07/06/2016  . Osteoarthritis of both hands 07/06/2016  . Seasonal allergies 07/06/2016  . Sinus complaint   . Sjogren's syndrome (HCC)    dry eyes  . Tenonitis   . Urinary incontinence    occ. an issue wears pads    Past Surgical History:  Procedure Laterality Date  . ABDOMINAL HYSTERECTOMY    . BACK SURGERY    . BREAST SURGERY Right    cyst drainage  . SHOULDER SURGERY Right    right shoulder tendon and rotator cuff repair  . TOTAL HIP ARTHROPLASTY Right 2006  . TOTAL HIP ARTHROPLASTY Left 02/24/2016   Procedure: LEFT TOTAL HIP ARTHROPLASTY ANTERIOR APPROACH;  Surgeon: Kathryne Hitch, MD;  Location: [facility];  Service: Orthopedics;  Laterality: Left;    There were no vitals filed for this visit.      Subjective Assessment - 09/27/16 1457    Subjective I feel good. I wake up in the middle of the night with minimal pain.  If patient keeps her knee covered and feels better.  Urinary leakage decreased by 30%.  Patient uses 3 pads per day. When wakes up the urinary leakge is less.    Patient Stated Goals increase strength to decrease urinary leakage   Currently in Pain? No/denies                      Pelvic Floor Special Questions - 09/27/16 0001    Pelvic Floor Internal Exam Patient confirms identification and approves PT to assess pelvic muscle integrity   Exam Type Vaginal   Strength fair squeeze, definite lift           [facility] Adult PT Treatment/Exercise - 09/27/16 0001      Manual Therapy   Manual Therapy Internal Pelvic Floor   Internal Pelvic Floor left obturator internist, left ishiococcygeus, puborectalis, pubococcygeus, pirirformis, bil. side of urethra sphincter                PT Education - 09/27/16 1547    Education provided Yes   Education Details pelvic floor contraction with coughing and sit to stand   Person(s) Educated Patient   Methods Explanation;Demonstration;Handout   Comprehension Returned demonstration;Verbalized understanding          PT Short Term Goals - 09/27/16 1555      PT SHORT TERM  GOAL #2   Title pelvic pain with sleeping decreased >/= 25% due to increased tissue mobility   Time 4   Period Weeks   Status Achieved     PT SHORT TERM GOAL #3   Title urinary leakage decreased >/= 25% with transitional movements due to ability to not bear down with pelvic floor contraction   Time 4   Period Weeks   Status Achieved     PT SHORT TERM GOAL #4   Title ambulate with increased hip extension and abduction due to increased tissue mobility   Time 4   Period Weeks   Status Achieved           PT Long Term Goals - 09/25/16 1356      PT LONG TERM GOAL #1   Title independent with HEP   Time 8   Period Weeks   Status On-going     PT LONG TERM GOAL #2   Title urinary leakage decreased >/= 75% due to pelvic floor strength >/= 4/5 due to decreased tightness in pelvic floor muscles   Time 8    Period Weeks   Status On-going     PT LONG TERM GOAL #3   Title not wake up in the middle of the night due to tissue mobility in abdominal and left hip   Time 8   Period Weeks   Status On-going     PT LONG TERM GOAL #4   Title FOTO score is </= 48% limitation   Time 8   Period Weeks   Status On-going               Plan - 09/27/16 1550    Clinical Impression Statement Patient urinary leakage is better by 30% and is now changing her pads 3 times per day.  Patient is not gushing urine when she is going from sit to stand in the middle of the night to go to the bathroom.  Patient had trigger points on the left side of the pelvic floor muscles.  Patient will push out on her pelvic floor with coughing and needs tactile cues to lift pelvic floor and contract abdominals with cough.  Patient will adduct and internally rotated bil hips with sit to stand and stand to sit. Patient will benefit from skilled therapy to improve pelvic floor  strength and function.   Rehab Potential Excellent   Clinical Impairments Affecting Rehab Potential right posterior THR 2006; Left anterior hip replacement 02/24/2016; back surgery   PT Frequency 2x / week   PT Duration 8 weeks   PT Treatment/Interventions Dry needling;Taping;Biofeedback;Electrical Stimulation;Cryotherapy;Ultrasound;Moist Heat;Therapeutic activities;Therapeutic exercise;Neuromuscular re-education;Patient/family education;Passive range of motion;Scar mobilization;Manual techniques   PT Next Visit Plan pelvic floor contraction with activities; hip strength, sit to stand and back with knees pressed out to engage gluteals   PT Home Exercise Plan progress as needed   Consulted and Agree with Plan of Care Patient      Patient will benefit from skilled therapeutic intervention in order to improve the following deficits and impairments:  Abnormal gait, Difficulty walking, Pain, Decreased strength, Decreased mobility, Increased muscle spasms,  Increased fascial restricitons, Decreased coordination  Visit Diagnosis: Muscle weakness (generalized)  Unspecified lack of coordination  Stiffness of left hip, not elsewhere classified     Problem List Patient Active Problem List   Diagnosis Date Noted  . Bilateral bunions 08/21/2016  . Seasonal allergies 07/06/2016  . Asthma 07/06/2016  . Sjogren's syndrome (HCC) 07/06/2016  . DDD (degenerative  disc disease), lumbar 07/06/2016  . Bilateral primary osteoarthritis of knee 07/06/2016  . Osteoarthritis of both hands 07/06/2016  . Myofacial muscle pain 07/06/2016  . DDD lumbar spine with spinal stenosis 07/06/2016  . Abnormality of gait 06/26/2016  . Chronic pain of right ankle 06/26/2016  . Osteoarthritis of left hip 02/24/2016  . Status post left hip replacement 02/24/2016  . Varicose veins 04/20/2015  . Lipidemia 04/20/2015  . Rheumatoid arthritis (HCC) 11/20/2013  . Low back pain 11/20/2013  . HTN (hypertension) 11/20/2013  . GERD (gastroesophageal reflux disease) 11/20/2013  . Bladder spasm 11/20/2013  . Hyperlipidemia 11/20/2013  . Status post right hip replacement 11/20/2013  . Bilateral swelling of feet   . Bruises easily     Eulis Foster, PT 09/27/16 3:57 PM   [facility] [address] [place], [place], [address] Phone: [phone]   Fax:  [phone]  Name: Kathy Duran MRN: [id number] Date of Birth: 23-Jan-1946

## 2016-10-02 ENCOUNTER — Ambulatory Visit: Payer: Medicare PPO | Admitting: Physical Therapy

## 2016-10-02 DIAGNOSIS — M6281 Muscle weakness (generalized): Secondary | ICD-10-CM | POA: Diagnosis not present

## 2016-10-02 DIAGNOSIS — R2689 Other abnormalities of gait and mobility: Secondary | ICD-10-CM | POA: Diagnosis not present

## 2016-10-02 DIAGNOSIS — R279 Unspecified lack of coordination: Secondary | ICD-10-CM

## 2016-10-02 DIAGNOSIS — M25652 Stiffness of left hip, not elsewhere classified: Secondary | ICD-10-CM

## 2016-10-02 NOTE — Therapy (Signed)
[facility] [address] [place], [place], [address] Phone: [phone]   Fax:  [phone]  Physical Therapy Treatment  Patient Details  Name: Kathy Duran MRN: [id number] Date of Birth: 21-Nov-1945 Referring Provider: Dr. Myna Hidalgo  Encounter Date: 10/02/2016      PT End of Session - 10/02/16 1213    Visit Number 8   Number of Visits 10   Date for PT Re-Evaluation 10/31/16   PT Start Time 1145   PT Stop Time 1230   PT Time Calculation (min) 45 min   Activity Tolerance Patient tolerated treatment well      Past Medical History:  Diagnosis Date  . Anxiety   . Arthritis    left hip  . Asthma    Inhalers for tx  . Bilateral primary osteoarthritis of knee 07/06/2016   Moderate  . Bilateral swelling of feet    bilateral ankle and feet edema  . Bruises easily   . DDD (degenerative disc disease), lumbar 07/06/2016  . Hypertension   . Myofacial muscle pain 07/06/2016  . Osteoarthritis of both hands 07/06/2016  . Seasonal allergies 07/06/2016  . Sinus complaint   . Sjogren's syndrome (HCC)    dry eyes  . Tenonitis   . Urinary incontinence    occ. an issue wears pads    Past Surgical History:  Procedure Laterality Date  . ABDOMINAL HYSTERECTOMY    . BACK SURGERY    . BREAST SURGERY Right    cyst drainage  . SHOULDER SURGERY Right    right shoulder tendon and rotator cuff repair  . TOTAL HIP ARTHROPLASTY Right 2006  . TOTAL HIP ARTHROPLASTY Left 02/24/2016   Procedure: LEFT TOTAL HIP ARTHROPLASTY ANTERIOR APPROACH;  Surgeon: Kathryne Hitch, MD;  Location: [facility];  Service: Orthopedics;  Laterality: Left;    There were no vitals filed for this visit.      Subjective Assessment - 10/02/16 1152    Subjective My knee is letting me know it's there.   I had a busy day Sunday.  On Sunday I went up and down the steps with ease without wobbling.  I have so much more confidence in my legs now.      Currently in Pain? Yes   Pain Score 3    Pain Location Knee   Pain Orientation Right   Pain Type Chronic pain   Aggravating Factors  knee wakes me up at night;  cold weather                         [facility] Adult PT Treatment/Exercise - 10/02/16 0001      Therapeutic Activites    ADL's walking, standing and transitional movements   Other Therapeutic Activities sit to stand     Neuro Re-ed    Neuro Re-ed Details  transverse abdom activation;  gluteal activation     Knee/Hip Exercises: Aerobic   Nustep 10 min seat 10     Knee/Hip Exercises: Standing   Hip Abduction Stengthening;Right;Left;10 reps   Abduction Limitations red band   Hip Extension Stengthening;Right;Left;10 reps   Extension Limitations red band   Other Standing Knee Exercises glut squeeze with wall slides 10x   Other Standing Knee Exercises wall climbs UE/LE 10x     Knee/Hip Exercises: Seated   Long Arc Quad Strengthening;Right;Left;10 reps   Long Arc Quad Limitations red band   Knee/Hip Flexion red band 10x right/left  Knee/Hip Exercises: Supine   Bridges AROM;Both  with red band   Other Supine Knee/Hip Exercises red band clams single and double 25x        Moist heat to bilateral hips after ex to decrease post-ex soreness.            PT Short Term Goals - 10/02/16 1222      PT SHORT TERM GOAL #1   Title independent with Initial HEP   Status Achieved     PT SHORT TERM GOAL #2   Title pelvic pain with sleeping decreased >/= 25% due to increased tissue mobility   Status Achieved     PT SHORT TERM GOAL #3   Title urinary leakage decreased >/= 25% with transitional movements due to ability to not bear down with pelvic floor contraction   Status Achieved     PT SHORT TERM GOAL #4   Title ambulate with increased hip extension and abduction due to increased tissue mobility   Status Achieved           PT Long Term Goals - 10/02/16 1222      PT LONG TERM GOAL #1    Title independent with HEP   Time 8   Period Weeks   Status On-going     PT LONG TERM GOAL #2   Title urinary leakage decreased >/= 75% due to pelvic floor strength >/= 4/5 due to decreased tightness in pelvic floor muscles   Time 8   Period Weeks   Status On-going     PT LONG TERM GOAL #3   Title not wake up in the middle of the night due to tissue mobility in abdominal and left hip   Time 8   Period Weeks   Status On-going     PT LONG TERM GOAL #4   Title FOTO score is </= 48% limitation   Time 8   Period Weeks   Status On-going               Plan - 10/02/16 1214    Clinical Impression Statement Muscular fatigue in gluteals in supine and standing in particular.  Patient states, "I can tell that muscle is weak."  "I think the right side is weaker."  Verbal cues for upright posture.  Exercises to facilitate sit to stand, walking and standing.  Verbal and tactile cues to activate gluteals.     PT Next Visit Plan pelvic floor contraction with activities; hip strength, sit to stand and back with knees pressed out to engage gluteals      Patient will benefit from skilled therapeutic intervention in order to improve the following deficits and impairments:     Visit Diagnosis: Muscle weakness (generalized)  Unspecified lack of coordination  Stiffness of left hip, not elsewhere classified  Other abnormalities of gait and mobility     Problem List Patient Active Problem List   Diagnosis Date Noted  . Bilateral bunions 08/21/2016  . Seasonal allergies 07/06/2016  . Asthma 07/06/2016  . Sjogren's syndrome (HCC) 07/06/2016  . DDD (degenerative disc disease), lumbar 07/06/2016  . Bilateral primary osteoarthritis of knee 07/06/2016  . Osteoarthritis of both hands 07/06/2016  . Myofacial muscle pain 07/06/2016  . DDD lumbar spine with spinal stenosis 07/06/2016  . Abnormality of gait 06/26/2016  . Chronic pain of right ankle 06/26/2016  . Osteoarthritis of left hip  02/24/2016  . Status post left hip replacement 02/24/2016  . Varicose veins 04/20/2015  . Lipidemia 04/20/2015  .  Rheumatoid arthritis (HCC) 11/20/2013  . Low back pain 11/20/2013  . HTN (hypertension) 11/20/2013  . GERD (gastroesophageal reflux disease) 11/20/2013  . Bladder spasm 11/20/2013  . Hyperlipidemia 11/20/2013  . Status post right hip replacement 11/20/2013  . Bilateral swelling of feet   . Bruises easily    Lavinia Sharps, PT 10/02/16 12:29 PM Phone: [phone] Fax: [phone]  Vivien Presto 10/02/2016, 12:26 PM  [facility] [address] [place], [place], [address] Phone: [phone]   Fax:  [phone]  Name: SOLAE YAFFE MRN: [id number] Date of Birth: 1946-07-15

## 2016-10-08 ENCOUNTER — Ambulatory Visit: Payer: Medicare PPO | Admitting: Physical Therapy

## 2016-10-08 ENCOUNTER — Encounter: Payer: Self-pay | Admitting: Physical Therapy

## 2016-10-08 DIAGNOSIS — M6281 Muscle weakness (generalized): Secondary | ICD-10-CM | POA: Diagnosis not present

## 2016-10-08 DIAGNOSIS — M25652 Stiffness of left hip, not elsewhere classified: Secondary | ICD-10-CM

## 2016-10-08 DIAGNOSIS — R2689 Other abnormalities of gait and mobility: Secondary | ICD-10-CM

## 2016-10-08 DIAGNOSIS — R279 Unspecified lack of coordination: Secondary | ICD-10-CM | POA: Diagnosis not present

## 2016-10-08 NOTE — Patient Instructions (Addendum)
Cough: Phase 3 (Supine)    Lie flat. Squeeze pelvic floor and hold. Inhale. Lift head and shoulders. Cough. Relax. Repeat _5__ times. Do __2_ times a day.   Copyright  VHI. All rights reserved.  Sit to Stand: Phase 3    Sitting, squeeze pelvic floor and hold. Lean trunk forward. Push up on arms and stand up. Relax. Then do when you go from stand to sit.  Do every time you get out of a chair.    Copyright  VHI. All rights reserved.   Bracing With Leg Lift (Prone)    With pillow support, lie on abdomen and neutral spine, tighten pelvic floor and abdominals and hold. Alternately raise legs off floor. Repeat __10_ times. Do _1__ times a day.   Copyright  VHI. All rights reserved.  [facility] [address], [address] [place], [place] [address] Phone # [phone] Fax [phone]

## 2016-10-08 NOTE — Therapy (Signed)
[facility] [address] [place], [place], [address] Phone: [phone]   Fax:  [phone]  Physical Therapy Treatment  Patient Details  Name: Kathy Duran MRN: [id number] Date of Birth: [birth year] Referring Provider: Dr. Janyth Pupa  Encounter Date: 10/08/2016      PT End of Session - 10/08/16 0821    Visit Number 9   Number of Visits 19   Date for PT Re-Evaluation 10/31/16   Authorization Type medicare g-code on 19th visit; kx modifier on 15th visit   PT Start Time 0812  patient came late   PT Stop Time 0900   PT Time Calculation (min) 48 min   Activity Tolerance Patient tolerated treatment well   Behavior During Therapy [facility] for tasks assessed/performed      Past Medical History:  Diagnosis Date  . Anxiety   . Arthritis    left hip  . Asthma    Inhalers for tx  . Bilateral primary osteoarthritis of knee 07/06/2016   Moderate  . Bilateral swelling of feet    bilateral ankle and feet edema  . Bruises easily   . DDD (degenerative disc disease), lumbar 07/06/2016  . Hypertension   . Myofacial muscle pain 07/06/2016  . Osteoarthritis of both hands 07/06/2016  . Seasonal allergies 07/06/2016  . Sinus complaint   . Sjogren's syndrome ([place])    dry eyes  . Tenonitis   . Urinary incontinence    occ. an issue wears pads    Past Surgical History:  Procedure Laterality Date  . ABDOMINAL HYSTERECTOMY    . BACK SURGERY    . BREAST SURGERY Right    cyst drainage  . SHOULDER SURGERY Right    right shoulder tendon and rotator cuff repair  . TOTAL HIP ARTHROPLASTY Right 2006  . TOTAL HIP ARTHROPLASTY Left 02/24/2016   Procedure: LEFT TOTAL HIP ARTHROPLASTY ANTERIOR APPROACH;  Surgeon: Mcarthur Rossetti, MD;  Location: [facility];  Service: Orthopedics;  Laterality: Left;    There were no vitals filed for this visit.          [facility] PT Assessment - 10/08/16 0001      Assessment   Medical  Diagnosis R10.2 pelvic pain   Referring Provider Dr. Janyth Pupa   Onset Date/Surgical Date 02/24/16   Prior Therapy Yes     Precautions   Precautions None     Restrictions   Weight Bearing Restrictions No     Balance Screen   Has the patient fallen in the past 6 months No   Has the patient had a decrease in activity level because of a fear of falling?  No   Is the patient reluctant to leave their home because of a fear of falling?  No     Home Environment   Living Environment Private residence   Available Help at Discharge Family   Type of [place] One level     Prior Function   Level of [place] Retired     Associate Professor   Overall Cognitive Status Within Functional Limits for tasks assessed     Observation/Other Assessments   Focus on Therapeutic Outcomes (FOTO)  56% limitation     AROM   Left Hip Extension 10   Lumbar Extension decreased by 50%     Strength   Right Hip External Rotation  3+/5   Left Hip Extension 3+/5   Left Hip External Rotation 3+/5  Right Knee Extension 4+/5   Left Knee Extension 4+/5     Palpation   SI assessment  pelvis in correct alignment   Palpation comment No palpable tenderness     Trendelenburg Test   Findings Negative   Side Left   Comments no hip drop                  Pelvic Floor Special Questions - 10/08/16 0001    Urinary Leakage Yes  improved by 50%   Pad use 2 light day pads   Pelvic Floor Internal Exam Patient confirms identification and approves PT to assess pelvic muscle integrity   Exam Type Vaginal   Palpation tenderness located on left ATL   Strength fair squeeze, definite lift  better lift           [facility] Adult PT Treatment/Exercise - 10/08/16 0001      Lumbar Exercises: Supine   Other Supine Lumbar Exercises supine contract pelvic floor and lift head to cough 10x     Knee/Hip Exercises: Aerobic   Nustep 10 min seat 10; arms #11     Knee/Hip  Exercises: Seated   Sit to Sand 10 reps;without UE support  contracting pelvic floor     Knee/Hip Exercises: Prone   Hip Extension 1 set;Left;10 reps  with pelvic floor contraction     Manual Therapy   Manual Therapy Internal Pelvic Floor   Internal Pelvic Floor along the left obturator internist, left  ischial spine, and ATLA                PT Education - 10/08/16 0903    Education provided Yes   Education Details pelvic floor contraction with sit to stand and back, supine lift head to cough, prone hip extension   Person(s) Educated Patient   Methods Explanation;Demonstration;Handout   Comprehension Returned demonstration;Verbalized understanding          PT Short Term Goals - 10/02/16 1222      PT SHORT TERM GOAL #1   Title independent with Initial HEP   Status Achieved     PT SHORT TERM GOAL #2   Title pelvic pain with sleeping decreased >/= 25% due to increased tissue mobility   Status Achieved     PT SHORT TERM GOAL #3   Title urinary leakage decreased >/= 25% with transitional movements due to ability to not bear down with pelvic floor contraction   Status Achieved     PT SHORT TERM GOAL #4   Title ambulate with increased hip extension and abduction due to increased tissue mobility   Status Achieved           PT Long Term Goals - 10/08/16 0817      PT LONG TERM GOAL #1   Title independent with HEP   Time 8   Period Weeks   Status On-going  still learning     PT LONG TERM GOAL #2   Title urinary leakage decreased >/= 75% due to pelvic floor strength >/= 4/5 due to decreased tightness in pelvic floor muscles   Time 8   Period Weeks   Status On-going  50% better     PT LONG TERM GOAL #3   Title not wake up in the middle of the night due to tissue mobility in abdominal and left hip   Time 8   Period Weeks   Status On-going  90% better     PT LONG TERM GOAL #4  Title FOTO score is </= 48% limitation   Time 8   Period Weeks   Status  On-going               Plan - 10-26-16 0904    Clinical Impression Statement Patient reports her urinary leakage has improved by 50%.  Patient left hip and lower abdominal pain has decreased by 90%.  Patient has full left hip extension.  Patient has weakness in bil. hip ER, left hip extension.  Patient pelvis in correct alignment.  Palpable tenderness located on left ATLA and along the ischial spine. Patient has met her STG's.  Patient FOTO has improved to 56% limitaiton.  Patient will benefit from skilled therpay to improve pelvic floor and hip strength to reduce urinary leakage.    Rehab Potential Excellent   Clinical Impairments Affecting Rehab Potential right posterior THR 2006; Left anterior hip replacement 02/24/2016; back surgery   PT Frequency 2x / week   PT Duration 8 weeks   PT Treatment/Interventions Dry needling;Taping;Biofeedback;Electrical Stimulation;Cryotherapy;Ultrasound;Moist Heat;Therapeutic activities;Therapeutic exercise;Neuromuscular re-education;Patient/family education;Passive range of motion;Scar mobilization;Manual techniques   PT Next Visit Plan pelvic floor contraction with activities; hip strength, sit to stand and back with knees pressed out to engage gluteals; work on hip ER and hip extension;    PT Home Exercise Plan progress as needed   Consulted and Agree with Plan of Care Patient      Patient will benefit from skilled therapeutic intervention in order to improve the following deficits and impairments:  Abnormal gait, Difficulty walking, Pain, Decreased strength, Decreased mobility, Increased muscle spasms, Increased fascial restricitons, Decreased coordination  Visit Diagnosis: Muscle weakness (generalized)  Unspecified lack of coordination  Stiffness of left hip, not elsewhere classified  Other abnormalities of gait and mobility       G-Codes - Oct 26, 2016 0903    Functional Assessment Tool Used (Outpatient Only) FOTO score is 56% limitation for  urinary problem   Functional Limitation Other PT primary   Other PT Primary Current Status (M0867) At least 40 percent but less than 60 percent impaired, limited or restricted   Other PT Primary Goal Status (Y1950) At least 40 percent but less than 60 percent impaired, limited or restricted      Problem List Patient Active Problem List   Diagnosis Date Noted  . Bilateral bunions 08/21/2016  . Seasonal allergies 07/06/2016  . Asthma 07/06/2016  . Sjogren's syndrome ([place]) 07/06/2016  . DDD (degenerative disc disease), lumbar 07/06/2016  . Bilateral primary osteoarthritis of knee 07/06/2016  . Osteoarthritis of both hands 07/06/2016  . Myofacial muscle pain 07/06/2016  . DDD lumbar spine with spinal stenosis 07/06/2016  . Abnormality of gait 06/26/2016  . Chronic pain of right ankle 06/26/2016  . Osteoarthritis of left hip 02/24/2016  . Status post left hip replacement 02/24/2016  . Varicose veins 04/20/2015  . Lipidemia 04/20/2015  . Rheumatoid arthritis ([place]) 11/20/2013  . Low back pain 11/20/2013  . HTN (hypertension) 11/20/2013  . GERD (gastroesophageal reflux disease) 11/20/2013  . Bladder spasm 11/20/2013  . Hyperlipidemia 11/20/2013  . Status post right hip replacement 11/20/2013  . Bilateral swelling of feet   . Bruises easily     Earlie Counts, PT 26-Oct-2016 9:12 AM    [facility] [address], [place], [place], [address] Phone: [phone]   Fax:  [phone]  Name: Kathy Duran MRN: [id number] Date of Birth: Mar 18, 1946

## 2016-10-09 ENCOUNTER — Ambulatory Visit: Payer: Medicare PPO | Admitting: Physical Therapy

## 2016-10-09 DIAGNOSIS — M25652 Stiffness of left hip, not elsewhere classified: Secondary | ICD-10-CM

## 2016-10-09 DIAGNOSIS — R2689 Other abnormalities of gait and mobility: Secondary | ICD-10-CM | POA: Diagnosis not present

## 2016-10-09 DIAGNOSIS — M6281 Muscle weakness (generalized): Secondary | ICD-10-CM

## 2016-10-09 DIAGNOSIS — R279 Unspecified lack of coordination: Secondary | ICD-10-CM | POA: Diagnosis not present

## 2016-10-09 NOTE — Therapy (Signed)
[facility] [address] [place], [place], [address] Phone: [phone]   Fax:  [phone]  Physical Therapy Treatment  Patient Details  Name: Kathy Duran MRN: [id number] Date of Birth: Sep 14, 1945 Referring Provider: Dr. Myna Hidalgo  Encounter Date: 10/09/2016      PT End of Session - 10/09/16 1200    Visit Number 10   Number of Visits 19   Date for PT Re-Evaluation 10/31/16   Authorization Type medicare g-code on 19th visit; kx modifier on 15th visit   PT Start Time 1145   PT Stop Time 1227   PT Time Calculation (min) 42 min   Activity Tolerance Patient tolerated treatment well      Past Medical History:  Diagnosis Date  . Anxiety   . Arthritis    left hip  . Asthma    Inhalers for tx  . Bilateral primary osteoarthritis of knee 07/06/2016   Moderate  . Bilateral swelling of feet    bilateral ankle and feet edema  . Bruises easily   . DDD (degenerative disc disease), lumbar 07/06/2016  . Hypertension   . Myofacial muscle pain 07/06/2016  . Osteoarthritis of both hands 07/06/2016  . Seasonal allergies 07/06/2016  . Sinus complaint   . Sjogren's syndrome (HCC)    dry eyes  . Tenonitis   . Urinary incontinence    occ. an issue wears pads    Past Surgical History:  Procedure Laterality Date  . ABDOMINAL HYSTERECTOMY    . BACK SURGERY    . BREAST SURGERY Right    cyst drainage  . SHOULDER SURGERY Right    right shoulder tendon and rotator cuff repair  . TOTAL HIP ARTHROPLASTY Right 2006  . TOTAL HIP ARTHROPLASTY Left 02/24/2016   Procedure: LEFT TOTAL HIP ARTHROPLASTY ANTERIOR APPROACH;  Surgeon: Kathryne Hitch, MD;  Location: [facility];  Service: Orthopedics;  Laterality: Left;    There were no vitals filed for this visit.      Subjective Assessment - 10/09/16 1148    Subjective Excited about going on trip to beach.  My knee has calmed down.  Normally my knee bothers me at the end  of the day.  My hips too.     Currently in Pain? No/denies   Pain Score 0-No pain   Aggravating Factors  cold weather;  prolonged standing                         [facility] Adult PT Treatment/Exercise - 10/09/16 0001      Therapeutic Activites    ADL's walking, standing and transitional movements   Other Therapeutic Activities sit to stand     Neuro Re-ed    Neuro Re-ed Details  transverse abdom activation;  gluteal activation     Knee/Hip Exercises: Stretches   Active Hamstring Stretch Right;Left;3 reps   Other Knee/Hip Stretches seated hip adductor and rotator hip stretch 2x 30 sec     Knee/Hip Exercises: Standing   Hip Abduction Stengthening;Right;Left;10 reps   Abduction Limitations red band   Hip Extension Stengthening;Right;Left;10 reps   Extension Limitations red band   Other Standing Knee Exercises glut squeeze with wall slides 10x   Other Standing Knee Exercises wall climbs UE/LE 10x     Knee/Hip Exercises: Seated   Long Arc Quad Strengthening;Right;Left;10 reps   Long Arc Quad Limitations red band   Knee/Hip Flexion red band 10x right/left   Sit to  Sand 5 reps  with red band     Knee/Hip Exercises: Supine   Bridges AROM;Both  with red ball   Other Supine Knee/Hip Exercises red band clams single and double 25x     Knee/Hip Exercises: Prone   Hip Extension Strengthening;Right;Left;10 reps  over 2 pillows                  PT Short Term Goals - 10/09/16 1223      PT SHORT TERM GOAL #1   Title independent with Initial HEP   Status Achieved     PT SHORT TERM GOAL #2   Title pelvic pain with sleeping decreased >/= 25% due to increased tissue mobility   Status Achieved     PT SHORT TERM GOAL #3   Title urinary leakage decreased >/= 25% with transitional movements due to ability to not bear down with pelvic floor contraction   Status Achieved     PT SHORT TERM GOAL #4   Title ambulate with increased hip extension and abduction due to  increased tissue mobility   Status Achieved           PT Long Term Goals - 10/09/16 1223      PT LONG TERM GOAL #1   Title independent with HEP   Time 8   Period Weeks   Status On-going     PT LONG TERM GOAL #2   Title urinary leakage decreased >/= 75% due to pelvic floor strength >/= 4/5 due to decreased tightness in pelvic floor muscles   Time 8   Period Weeks   Status On-going     PT LONG TERM GOAL #3   Title not wake up in the middle of the night due to tissue mobility in abdominal and left hip   Period Weeks   Status On-going     PT LONG TERM GOAL #4   Title FOTO score is </= 48% limitation   Time 8   Period Weeks   Status On-going               Plan - 10/09/16 1203    Clinical Impression Statement Quick muscle fatigue in hip extensors and abductors.  No knee pain reported during exercise but she has gluteal muscle spasm with standing hip abduction secondary to weakness.  Therapist closely monitoring response and modifying as needed.  Patient reports a good understanding of pelvic floor contraction with transitional movements.     PT Next Visit Plan pelvic floor contraction with activities; hip strength, sit to stand and back with knees pressed out to engage gluteals; work on hip ER and hip extension;       Patient will benefit from skilled therapeutic intervention in order to improve the following deficits and impairments:     Visit Diagnosis: Muscle weakness (generalized)  Unspecified lack of coordination  Stiffness of left hip, not elsewhere classified  Other abnormalities of gait and mobility       G-Codes - 10/11/16 0903    Functional Assessment Tool Used (Outpatient Only) FOTO score is 56% limitation for urinary problem   Functional Limitation Other PT primary   Other PT Primary Current Status (F3832) At least 40 percent but less than 60 percent impaired, limited or restricted   Other PT Primary Goal Status (N1916) At least 40 percent but  less than 60 percent impaired, limited or restricted      Problem List Patient Active Problem List   Diagnosis Date Noted  .  Bilateral bunions 08/21/2016  . Seasonal allergies 07/06/2016  . Asthma 07/06/2016  . Sjogren's syndrome (HCC) 07/06/2016  . DDD (degenerative disc disease), lumbar 07/06/2016  . Bilateral primary osteoarthritis of knee 07/06/2016  . Osteoarthritis of both hands 07/06/2016  . Myofacial muscle pain 07/06/2016  . DDD lumbar spine with spinal stenosis 07/06/2016  . Abnormality of gait 06/26/2016  . Chronic pain of right ankle 06/26/2016  . Osteoarthritis of left hip 02/24/2016  . Status post left hip replacement 02/24/2016  . Varicose veins 04/20/2015  . Lipidemia 04/20/2015  . Rheumatoid arthritis (HCC) 11/20/2013  . Low back pain 11/20/2013  . HTN (hypertension) 11/20/2013  . GERD (gastroesophageal reflux disease) 11/20/2013  . Bladder spasm 11/20/2013  . Hyperlipidemia 11/20/2013  . Status post right hip replacement 11/20/2013  . Bilateral swelling of feet   . Bruises easily    Lavinia Sharps, PT 10/09/16 12:26 PM Phone: [phone] Fax: [phone]  Vivien Presto 10/09/2016, 12:25 PM  [facility] [address] [place], [place], [address] Phone: [phone]   Fax:  [phone]  Name: Kathy Duran MRN: [id number] Date of Birth: 26-May-1946

## 2016-10-11 ENCOUNTER — Ambulatory Visit: Payer: Self-pay | Admitting: Physical Therapy

## 2016-10-12 DIAGNOSIS — K529 Noninfective gastroenteritis and colitis, unspecified: Secondary | ICD-10-CM | POA: Diagnosis not present

## 2016-10-12 DIAGNOSIS — R1110 Vomiting, unspecified: Secondary | ICD-10-CM | POA: Diagnosis not present

## 2016-10-16 ENCOUNTER — Encounter: Payer: Medicare PPO | Admitting: Physical Therapy

## 2016-10-18 ENCOUNTER — Encounter: Payer: Medicare PPO | Admitting: Physical Therapy

## 2016-10-25 ENCOUNTER — Ambulatory Visit: Payer: Medicare PPO | Attending: Obstetrics & Gynecology | Admitting: Physical Therapy

## 2016-10-25 DIAGNOSIS — R279 Unspecified lack of coordination: Secondary | ICD-10-CM | POA: Diagnosis not present

## 2016-10-25 DIAGNOSIS — M6281 Muscle weakness (generalized): Secondary | ICD-10-CM | POA: Diagnosis not present

## 2016-10-25 DIAGNOSIS — M25652 Stiffness of left hip, not elsewhere classified: Secondary | ICD-10-CM | POA: Insufficient documentation

## 2016-10-25 DIAGNOSIS — R2689 Other abnormalities of gait and mobility: Secondary | ICD-10-CM

## 2016-10-25 NOTE — Therapy (Signed)
[facility] [address], [address] [place], [place], [address] Phone: [phone]   Fax:  [phone]  Physical Therapy Treatment  Patient Details  Name: Kathy Duran MRN: [id number] Date of Birth: 09/23/1945 Referring Provider: Dr. Myna Hidalgo  Encounter Date: 10/25/2016      PT End of Session - 10/25/16 0942    Visit Number 11   Number of Visits 19   Date for PT Re-Evaluation 10/31/16   Authorization Type medicare g-code on 19th visit; kx modifier on 15th visit   PT Start Time 0934   PT Stop Time 1015   PT Time Calculation (min) 41 min   Activity Tolerance Patient tolerated treatment well      Past Medical History:  Diagnosis Date  . Anxiety   . Arthritis    left hip  . Asthma    Inhalers for tx  . Bilateral primary osteoarthritis of knee 07/06/2016   Moderate  . Bilateral swelling of feet    bilateral ankle and feet edema  . Bruises easily   . DDD (degenerative disc disease), lumbar 07/06/2016  . Hypertension   . Myofacial muscle pain 07/06/2016  . Osteoarthritis of both hands 07/06/2016  . Seasonal allergies 07/06/2016  . Sinus complaint   . Sjogren's syndrome (HCC)    dry eyes  . Tenonitis   . Urinary incontinence    occ. an issue wears pads    Past Surgical History:  Procedure Laterality Date  . ABDOMINAL HYSTERECTOMY    . BACK SURGERY    . BREAST SURGERY Right    cyst drainage  . SHOULDER SURGERY Right    right shoulder tendon and rotator cuff repair  . TOTAL HIP ARTHROPLASTY Right 2006  . TOTAL HIP ARTHROPLASTY Left 02/24/2016   Procedure: LEFT TOTAL HIP ARTHROPLASTY ANTERIOR APPROACH;  Surgeon: Kathryne Hitch, MD;  Location: [facility];  Service: Orthopedics;  Laterality: Left;    There were no vitals filed for this visit.      Subjective Assessment - 10/25/16 0938    Subjective Enjoyed time at the beach.  My knee did OK despite the cool down.  It bothers me at night.  Gets me up  twice a night.  Uses ice at night which helps better than heat.  Mornings are usually pretty good.  I'm pleased overall, I can feel my core is stronger but I still limp b/c of my right knee.     Currently in Pain? No/denies   Pain Score 0-No pain   Pain Type Chronic pain   Aggravating Factors  getting my steps in cummulates to night discomfort   Pain Relieving Factors ice at night                         [facility] Adult PT Treatment/Exercise - 10/25/16 0001      Therapeutic Activites    ADL's walking, standing and transitional movements   Other Therapeutic Activities sit to stand     Neuro Re-ed    Neuro Re-ed Details  transverse abdom activation;  gluteal activation     Lumbar Exercises: Supine   Ab Set 5 reps   Bridge 10 reps   Isometric Hip Flexion 5 reps     Knee/Hip Exercises: Aerobic   Nustep 10 min seat 10; arms #11     Knee/Hip Exercises: Standing   Hip Abduction Stengthening;Right;Left;10 reps   Abduction Limitations red band   Hip Extension Stengthening;Right;Left;10  reps   Extension Limitations red band   Other Standing Knee Exercises glut squeeze with wall slides 10x   Other Standing Knee Exercises wall climbs UE/LE 10x     Knee/Hip Exercises: Seated   Long Arc Quad Strengthening;Right;Left;10 reps   Long Arc Quad Limitations red band   Knee/Hip Flexion red band 10x right/left   Sit to [employer] 5 reps  with red band high table      Knee/Hip Exercises: Supine   Other Supine Knee/Hip Exercises red band clams single and double 25x     Knee/Hip Exercises: Prone   Hip Extension Strengthening;Right;Left;10 reps  over 2 pillows                  PT Short Term Goals - 10/25/16 0943      PT SHORT TERM GOAL #1   Title independent with Initial HEP   Status Achieved     PT SHORT TERM GOAL #2   Title pelvic pain with sleeping decreased >/= 25% due to increased tissue mobility   Status Achieved     PT SHORT TERM GOAL #3   Title urinary  leakage decreased >/= 25% with transitional movements due to ability to not bear down with pelvic floor contraction   Status Achieved     PT SHORT TERM GOAL #4   Title ambulate with increased hip extension and abduction due to increased tissue mobility   Status Achieved           PT Long Term Goals - 10/25/16 1008      PT LONG TERM GOAL #1   Title independent with HEP   Time 8   Period Weeks   Status On-going     PT LONG TERM GOAL #2   Title urinary leakage decreased >/= 75% due to pelvic floor strength >/= 4/5 due to decreased tightness in pelvic floor muscles   Time 8   Period Weeks   Status On-going     PT LONG TERM GOAL #3   Title not wake up in the middle of the night due to tissue mobility in abdominal and left hip   Time 8   Period Weeks   Status On-going     PT LONG TERM GOAL #4   Title FOTO score is </= 48% limitation   Time 8   Period Weeks   Status On-going               Plan - 10/25/16 0957    Clinical Impression Statement Weakness in hip extensors and abductors with quick fatigue especially in standing.  Some modifications needed for right knee pain (elevated chair height with sit to stand.)  Good carryover with transverse abdominal activation.  Patient notes functional improvements and overall improvements in pain/stiffness.  Getting 5,00 steps in per day per patient report.     PT Next Visit Plan pelvic floor contraction with activities; hip strength, sit to stand and back with knees pressed out to engage gluteals; work on hip ER and hip extension;       Patient will benefit from skilled therapeutic intervention in order to improve the following deficits and impairments:     Visit Diagnosis: Muscle weakness (generalized)  Unspecified lack of coordination  Stiffness of left hip, not elsewhere classified  Other abnormalities of gait and mobility     Problem List Patient Active Problem List   Diagnosis Date Noted  . Bilateral bunions  08/21/2016  . Seasonal allergies 07/06/2016  . Asthma  07/06/2016  . Sjogren's syndrome (HCC) 07/06/2016  . DDD (degenerative disc disease), lumbar 07/06/2016  . Bilateral primary osteoarthritis of knee 07/06/2016  . Osteoarthritis of both hands 07/06/2016  . Myofacial muscle pain 07/06/2016  . DDD lumbar spine with spinal stenosis 07/06/2016  . Abnormality of gait 06/26/2016  . Chronic pain of right ankle 06/26/2016  . Osteoarthritis of left hip 02/24/2016  . Status post left hip replacement 02/24/2016  . Varicose veins 04/20/2015  . Lipidemia 04/20/2015  . Rheumatoid arthritis (HCC) 11/20/2013  . Low back pain 11/20/2013  . HTN (hypertension) 11/20/2013  . GERD (gastroesophageal reflux disease) 11/20/2013  . Bladder spasm 11/20/2013  . Hyperlipidemia 11/20/2013  . Status post right hip replacement 11/20/2013  . Bilateral swelling of feet   . Bruises easily    Lavinia Sharps, PT 10/25/16 10:13 AM Phone: [phone] Fax: [phone]  Vivien Presto 10/25/2016, 10:11 AM  [facility] [address], [address] [place], [place], [address] Phone: [phone]   Fax:  [phone]  Name: IMOGEN MADDALENA MRN: [id number] Date of Birth: [birth year]

## 2016-10-29 ENCOUNTER — Encounter: Payer: Medicare PPO | Admitting: Physical Therapy

## 2016-10-30 ENCOUNTER — Ambulatory Visit: Payer: Medicare PPO | Admitting: Physical Therapy

## 2016-10-30 DIAGNOSIS — R279 Unspecified lack of coordination: Secondary | ICD-10-CM | POA: Diagnosis not present

## 2016-10-30 DIAGNOSIS — R2689 Other abnormalities of gait and mobility: Secondary | ICD-10-CM

## 2016-10-30 DIAGNOSIS — M25652 Stiffness of left hip, not elsewhere classified: Secondary | ICD-10-CM

## 2016-10-30 DIAGNOSIS — M6281 Muscle weakness (generalized): Secondary | ICD-10-CM

## 2016-10-30 NOTE — Therapy (Signed)
[facility] [address] [place], [place], [address] Phone: [phone]   Fax:  [phone]  Physical Therapy Treatment  Patient Details  Name: Kathy Duran MRN: [id number] Date of Birth: 1946-05-05 Referring Provider: Dr. Myna Hidalgo  Encounter Date: 10/30/2016      PT End of Session - 10/30/16 1002    Visit Number 12   Number of Visits 19   Date for PT Re-Evaluation 10/31/16   Authorization Type medicare g-code on 19th visit; kx modifier on 15th visit   PT Start Time 0932   PT Stop Time 1026   PT Time Calculation (min) 54 min   Activity Tolerance Patient tolerated treatment well      Past Medical History:  Diagnosis Date  . Anxiety   . Arthritis    left hip  . Asthma    Inhalers for tx  . Bilateral primary osteoarthritis of knee 07/06/2016   Moderate  . Bilateral swelling of feet    bilateral ankle and feet edema  . Bruises easily   . DDD (degenerative disc disease), lumbar 07/06/2016  . Hypertension   . Myofacial muscle pain 07/06/2016  . Osteoarthritis of both hands 07/06/2016  . Seasonal allergies 07/06/2016  . Sinus complaint   . Sjogren's syndrome (HCC)    dry eyes  . Tenonitis   . Urinary incontinence    occ. an issue wears pads    Past Surgical History:  Procedure Laterality Date  . ABDOMINAL HYSTERECTOMY    . BACK SURGERY    . BREAST SURGERY Right    cyst drainage  . SHOULDER SURGERY Right    right shoulder tendon and rotator cuff repair  . TOTAL HIP ARTHROPLASTY Right 2006  . TOTAL HIP ARTHROPLASTY Left 02/24/2016   Procedure: LEFT TOTAL HIP ARTHROPLASTY ANTERIOR APPROACH;  Surgeon: Kathryne Hitch, MD;  Location: [facility];  Service: Orthopedics;  Laterality: Left;    There were no vitals filed for this visit.      Subjective Assessment - 10/30/16 0936    Subjective The weather has got me moving slower today.  I'm still getting in 4-5,000 steps a day.  I feel stronger  in walking.  Following last PT visit and did some more walking and I was sore as a result.     Currently in Pain? No/denies   Pain Score 0-No pain   Pain Type Chronic pain                         [facility] Adult PT Treatment/Exercise - 10/30/16 0001      Therapeutic Activites    ADL's walking, standing and transitional movements   Other Therapeutic Activities sit to stand     Neuro Re-ed    Neuro Re-ed Details  transverse abdom activation;  gluteal activation     Lumbar Exercises: Supine   Ab Set 5 reps   Bridge 10 reps   Isometric Hip Flexion 5 reps   Other Supine Lumbar Exercises hip extension isometric 5x 5 sec hold     Knee/Hip Exercises: Stretches   Other Knee/Hip Stretches seated hip adductor and rotator hip stretch 2x 30 sec     Knee/Hip Exercises: Aerobic   Nustep 10 min seat 10; arms #11     Knee/Hip Exercises: Standing   Hip Abduction Stengthening;Right;Left;10 reps   Abduction Limitations red band   Hip Extension Stengthening;Right;Left;10 reps   Extension Limitations red band  Other Standing Knee Exercises --   Other Standing Knee Exercises wall climbs UE/LE 10x     Knee/Hip Exercises: Seated   Long Arc Quad Strengthening;Right;Left;10 reps   Long Arc Quad Limitations green band   Knee/Hip Flexion green band 10x right/left     Knee/Hip Exercises: Supine   Other Supine Knee/Hip Exercises green band clams single and double 25x     Moist Heat Therapy   Number Minutes Moist Heat 10 Minutes   Moist Heat Location Hip                  PT Short Term Goals - 10/30/16 1013      PT SHORT TERM GOAL #1   Title independent with Initial HEP   Status Achieved     PT SHORT TERM GOAL #2   Title pelvic pain with sleeping decreased >/= 25% due to increased tissue mobility   Status Achieved     PT SHORT TERM GOAL #3   Title urinary leakage decreased >/= 25% with transitional movements due to ability to not bear down with pelvic floor  contraction   Status Achieved     PT SHORT TERM GOAL #4   Title ambulate with increased hip extension and abduction due to increased tissue mobility   Status Achieved           PT Long Term Goals - 10/30/16 1650      PT LONG TERM GOAL #1   Title independent with HEP   Time 8   Period Weeks   Status On-going     PT LONG TERM GOAL #2   Title urinary leakage decreased >/= 75% due to pelvic floor strength >/= 4/5 due to decreased tightness in pelvic floor muscles   Time 8   Period Weeks   Status On-going     PT LONG TERM GOAL #3   Title not wake up in the middle of the night due to tissue mobility in abdominal and left hip   Time 8   Period Weeks   Status On-going     PT LONG TERM GOAL #4   Title FOTO score is </= 48% limitation   Time 8   Period Weeks   Status On-going               Plan - 10/30/16 1002    Clinical Impression Statement The patient reports functional improvements including walking for longer periods of time.  Continues to have difficulty with standing hip abduction bilaterally secondary to muscular fatigue in gluteals.  No complaints of knee pain today.  Will reassess progress toward goals and recheck objective measurements to determine continuation of PT vs. discharge to home or community based ex program.     PT Next Visit Plan check progress toward goals tomorrow to determine recertification vs. discharge;  MMT, FOTO;  pelvic floor contraction with activities; hip strength, sit to stand and back with knees pressed out to engage gluteals; work on hip ER and hip extension;       Patient will benefit from skilled therapeutic intervention in order to improve the following deficits and impairments:     Visit Diagnosis: Muscle weakness (generalized)  Unspecified lack of coordination  Stiffness of left hip, not elsewhere classified  Other abnormalities of gait and mobility     Problem List Patient Active Problem List   Diagnosis Date Noted   . Bilateral bunions 08/21/2016  . Seasonal allergies 07/06/2016  . Asthma 07/06/2016  . Sjogren's syndrome (  HCC) 07/06/2016  . DDD (degenerative disc disease), lumbar 07/06/2016  . Bilateral primary osteoarthritis of knee 07/06/2016  . Osteoarthritis of both hands 07/06/2016  . Myofacial muscle pain 07/06/2016  . DDD lumbar spine with spinal stenosis 07/06/2016  . Abnormality of gait 06/26/2016  . Chronic pain of right ankle 06/26/2016  . Osteoarthritis of left hip 02/24/2016  . Status post left hip replacement 02/24/2016  . Varicose veins 04/20/2015  . Lipidemia 04/20/2015  . Rheumatoid arthritis (HCC) 11/20/2013  . Low back pain 11/20/2013  . HTN (hypertension) 11/20/2013  . GERD (gastroesophageal reflux disease) 11/20/2013  . Bladder spasm 11/20/2013  . Hyperlipidemia 11/20/2013  . Status post right hip replacement 11/20/2013  . Bilateral swelling of feet   . Bruises easily    Lavinia Sharps, PT 10/30/16 4:52 PM Phone: [phone] Fax: [phone]  Vivien Presto 10/30/2016, 4:51 PM  [facility] [address], [address] [place], [place], [address] Phone: [phone]   Fax:  [phone]  Name: Kathy Duran MRN: [id number] Date of Birth: [birth year]

## 2016-10-31 ENCOUNTER — Encounter: Payer: Self-pay | Admitting: Physical Therapy

## 2016-10-31 ENCOUNTER — Ambulatory Visit: Payer: Medicare PPO | Admitting: Physical Therapy

## 2016-10-31 DIAGNOSIS — M6281 Muscle weakness (generalized): Secondary | ICD-10-CM | POA: Diagnosis not present

## 2016-10-31 DIAGNOSIS — M25652 Stiffness of left hip, not elsewhere classified: Secondary | ICD-10-CM

## 2016-10-31 DIAGNOSIS — R279 Unspecified lack of coordination: Secondary | ICD-10-CM | POA: Diagnosis not present

## 2016-10-31 DIAGNOSIS — R2689 Other abnormalities of gait and mobility: Secondary | ICD-10-CM

## 2016-10-31 NOTE — Patient Instructions (Addendum)
Toileting Techniques for Bowel Movements (Defecation) Using your belly (abdomen) and pelvic floor muscles to have a bowel movement is usually instinctive.  Sometimes people can have problems with these muscles and have to relearn proper defecation (emptying) techniques.  If you have weakness in your muscles, organs that are falling out, decreased sensation in your pelvis, or ignore your urge to go, you may find yourself straining to have a bowel movement.  You are straining if you are: . holding your breath or taking in a huge gulp of air and holding it  . keeping your lips and jaw tensed and closed tightly . turning red in the face because of excessive pushing or forcing . developing or worsening your  hemorrhoids . getting faint while pushing . not emptying completely and have to defecate many times a day  If you are straining, you are actually making it harder for yourself to have a bowel movement.  Many people find they are pulling up with the pelvic floor muscles and closing off instead of opening the anus. Due to lack pelvic floor relaxation and coordination the abdominal muscles, one has to work harder to push the feces out.  Many people have never been taught how to defecate efficiently and effectively.  Notice what happens to your body when you are having a bowel movement.  While you are sitting on the toilet pay attention to the following areas: . Jaw and mouth position . Angle of your hips   . Whether your feet touch the ground or not . Arm placement  . Spine position . Waist . Belly tension . Anus (opening of the anal canal)  An Evacuation/Defecation Plan   Here are the 4 basic points:  1. Lean forward enough for your elbows to rest on your knees 2. Support your feet on the floor or use a low stool if your feet don't touch the floor  3. Push out your belly as if you have swallowed a beach ball-you should feel a widening of your waist 4. Open and relax your pelvic floor muscles,  rather than tightening around the anus      The following conditions my require modifications to your toileting posture:  . If you have had surgery in the past that limits your back, hip, pelvic, knee or ankle flexibility . Constipation   Your healthcare practitioner may make the following additional suggestions and adjustments:  1) Sit on the toilet  a) Make sure your feet are supported. b) Notice your hip angle and spine position-most people find it effective to lean forward or raise their knees, which can help the muscles around the anus to relax  c) When you lean forward, place your forearms on your thighs for support  2) Relax suggestions a) Breath deeply in through your nose and out slowly through your mouth as if you are smelling the flowers and blowing out the candles. b) To become aware of how to relax your muscles, contracting and releasing muscles can be helpful.  Pull your pelvic floor muscles in tightly by using the image of holding back gas, or closing around the anus (visualize making a circle smaller) and lifting the anus up and in.  Then release the muscles and your anus should drop down and feel open. Repeat 5 times ending with the feeling of relaxation. c) Keep your pelvic floor muscles relaxed; let your belly bulge out. d) The digestive tract starts at the mouth and ends at the anal opening, so be   sure to relax both ends of the tube.  Place your tongue on the roof of your mouth with your teeth separated.  This helps relax your mouth and will help to relax the anus at the same time.  3) Empty (defecation) a) Keep your pelvic floor and sphincter relaxed, then bulge your anal muscles.  Make the anal opening wide.  b) Stick your belly out as if you have swallowed a beach ball. c) Make your belly wall hard using your belly muscles while continuing to breathe. Doing this makes it easier to open your anus. d) Breath out and give a grunt (or try using other sounds such as  ahhhh, shhhhh, ohhhh or grrrrrrr).  4) Finish a) As you finish your bowel movement, pull the pelvic floor muscles up and in.  This will leave your anus in the proper place rather than remaining pushed out and down. If you leave your anus pushed out and down, it will start to feel as though that is normal and give you incorrect signals about needing to have a bowel movement.    Eulis Foster, PT [facility] [address] [place], [place] [address] Sitting    Sit comfortably. Allow body's muscles to relax. Place hands on belly. Inhale slowly and deeply for 3___ seconds, so hands move out. Then take _3__ seconds to exhale. Repeat _5__ times. Do 2___ times a day. Do during bowel movement Copyright  VHI. All rights reserved.   Supine to Sit: Phase 4    On back, squeeze pelvic floor and hold. Inhale. Exhale. Roll to side. Push up on elbows as feet are off bed. Sit up. Relax. Repeat _5__ times. Do _2__ times a day. When get up out of bed in morning and before you go to bed.    Copyright  VHI. All rights reserved.   [facility] [address], [address] [place], [place] [address] Phone # [phone] Fax [phone]

## 2016-10-31 NOTE — Therapy (Signed)
[facility] [address] [place], [place], [address] Phone: [phone]   Fax:  [phone]  Physical Therapy Treatment  Patient Details  Name: Kathy Duran MRN: [id number] Date of Birth: 03/19/1946 Referring Provider: Dr. Myna Hidalgo  Encounter Date: 10/31/2016      PT End of Session - 10/31/16 1155    Visit Number 13   Number of Visits 19   Date for PT Re-Evaluation 12/26/16   Authorization Type medicare g-code on 19th visit; kx modifier on 15th visit   PT Start Time 1145   PT Stop Time 1230   PT Time Calculation (min) 45 min   Activity Tolerance Patient tolerated treatment well   Behavior During Therapy [facility] for tasks assessed/performed      Past Medical History:  Diagnosis Date  . Anxiety   . Arthritis    left hip  . Asthma    Inhalers for tx  . Bilateral primary osteoarthritis of knee 07/06/2016   Moderate  . Bilateral swelling of feet    bilateral ankle and feet edema  . Bruises easily   . DDD (degenerative disc disease), lumbar 07/06/2016  . Hypertension   . Myofacial muscle pain 07/06/2016  . Osteoarthritis of both hands 07/06/2016  . Seasonal allergies 07/06/2016  . Sinus complaint   . Sjogren's syndrome (HCC)    dry eyes  . Tenonitis   . Urinary incontinence    occ. an issue wears pads    Past Surgical History:  Procedure Laterality Date  . ABDOMINAL HYSTERECTOMY    . BACK SURGERY    . BREAST SURGERY Right    cyst drainage  . SHOULDER SURGERY Right    right shoulder tendon and rotator cuff repair  . TOTAL HIP ARTHROPLASTY Right 2006  . TOTAL HIP ARTHROPLASTY Left 02/24/2016   Procedure: LEFT TOTAL HIP ARTHROPLASTY ANTERIOR APPROACH;  Surgeon: Kathryne Hitch, MD;  Location: [facility];  Service: Orthopedics;  Laterality: Left;    There were no vitals filed for this visit.      Subjective Assessment - 10/31/16 1149    Subjective I am looking into a water program. My knees  are stiff from exercising yesterday. Urinary leakage when stand up for first step start to leak. I can control urinary leakage during the day. Only gush leak of urine urine during the night.    Patient Stated Goals increase strength to decrease urinary leakage   Currently in Pain? No/denies            [facility] PT Assessment - 10/31/16 0001      Assessment   Medical Diagnosis R10.2 pelvic pain   Referring Provider Dr. Myna Hidalgo   Onset Date/Surgical Date 02/24/16   Prior Therapy Yes     Precautions   Precautions None     Restrictions   Weight Bearing Restrictions No     Balance Screen   Has the patient fallen in the past 6 months No   Has the patient had a decrease in activity level because of a fear of falling?  No   Is the patient reluctant to leave their home because of a fear of falling?  No     Home Environment   Living Environment Private residence   Available Help at Discharge Family   Type of Home House   Home Layout One level     Prior Function   Level of Independence Independent   Vocation Retired  Cognition   Overall Cognitive Status Within Functional Limits for tasks assessed     Observation/Other Assessments   Focus on Therapeutic Outcomes (FOTO)  56% limitation     Posture/Postural Control   Posture/Postural Control No significant limitations     ROM / Strength   AROM / PROM / Strength AROM;Strength     Strength   Right Hip External Rotation  3+/5   Left Hip Extension 5/5   Left Hip External Rotation 5/5   Right Knee Extension 5/5   Left Knee Extension 5/5                  Pelvic Floor Special Questions - 10/31/16 0001    Urinary Leakage Yes   How often at night has gush of urine when wake up    Pad use 1-2 times per day depending on the activity   Urinary urgency No  can control the urge now   Urinary frequency no issues   Pelvic Floor Internal Exam Patient confirms identification and approves PT to assess pelvic muscle  integrity   Exam Type Vaginal   Palpation tightness in bil. ischiococcygeus   Strength good squeeze, good lift, able to hold agaisnt strong resistance           [facility] Adult PT Treatment/Exercise - 10/31/16 0001      Therapeutic Activites    Therapeutic Activities ADL's   ADL's pelvic floor contraction with supine to sit and sit to stand then walk   Other Therapeutic Activities sit to stand     Knee/Hip Exercises: Aerobic   Nustep 10 min seat 10; no arms level 3     Manual Therapy   Manual Therapy Internal Pelvic Floor   Internal Pelvic Floor bilateral ischiocavernosus                PT Education - 10/31/16 1220    Education provided Yes   Education Details toileting technique, pelvic floor contraction with getting out of bed   Person(s) Educated Patient   Methods Explanation;Demonstration;Verbal cues;Handout   Comprehension Returned demonstration;Verbalized understanding          PT Short Term Goals - 10/30/16 1013      PT SHORT TERM GOAL #1   Title independent with Initial HEP   Status Achieved     PT SHORT TERM GOAL #2   Title pelvic pain with sleeping decreased >/= 25% due to increased tissue mobility   Status Achieved     PT SHORT TERM GOAL #3   Title urinary leakage decreased >/= 25% with transitional movements due to ability to not bear down with pelvic floor contraction   Status Achieved     PT SHORT TERM GOAL #4   Title ambulate with increased hip extension and abduction due to increased tissue mobility   Status Achieved           PT Long Term Goals - 10/31/16 1151      PT LONG TERM GOAL #1   Title independent with HEP   Baseline very min limp    Time 8   Period Weeks   Status On-going     PT LONG TERM GOAL #2   Title urinary leakage decreased >/= 75% due to pelvic floor strength >/= 4/5 due to decreased tightness in pelvic floor muscles   Time 8   Period Weeks   Status On-going  30% better     PT LONG TERM GOAL #3   Title  not wake  up in the middle of the night due to tissue mobility in abdominal and left hip   Baseline with tape and shoe   Time 8   Period Weeks   Status Achieved     PT LONG TERM GOAL #4   Title FOTO score is </= 48% limitation   Time 8   Period Weeks   Status On-going               Plan - 10/31/16 1222    Clinical Impression Statement Patient has no hip pain just tightness in her knees.  Patient is able to not wake up due to hip pain.  Patient Hip strength has increased and improved mobility.  Patient does not have gait deficits while walking due to improve strength and mobility of muscles.  Patient does not gush urine during the day instead will have a slow leak.  Patient will gush urine during the night when going from sit to stand to walk to the bathroom. Pelvic floor strenth has increased to 4/5.   Patient has tightness in bilateral ischicoccygeus.  Patient will benefit from skilled therapy to work on pelvic floor muscles to improve urrinary leakage.    Rehab Potential Excellent   Clinical Impairments Affecting Rehab Potential right posterior THR 2006; Left anterior hip replacement 02/24/2016; back surgery   PT Frequency 1x / week   PT Duration 8 weeks   PT Treatment/Interventions Dry needling;Taping;Biofeedback;Electrical Stimulation;Cryotherapy;Ultrasound;Moist Heat;Therapeutic activities;Therapeutic exercise;Neuromuscular re-education;Patient/family education;Passive range of motion;Scar mobilization;Manual techniques   PT Next Visit Plan pelvic floor contraction with sit to stand, stand to walk with pelvic floor EMG, internal soft tissue work   PT Home Exercise Plan progress as needed   Consulted and Agree with Plan of Care Patient      Patient will benefit from skilled therapeutic intervention in order to improve the following deficits and impairments:  Abnormal gait, Difficulty walking, Pain, Decreased strength, Decreased mobility, Increased muscle spasms, Increased fascial  restricitons, Decreased coordination  Visit Diagnosis: Muscle weakness (generalized) - Plan: PT plan of care cert/re-cert  Unspecified lack of coordination - Plan: PT plan of care cert/re-cert  Stiffness of left hip, not elsewhere classified - Plan: PT plan of care cert/re-cert  Other abnormalities of gait and mobility - Plan: PT plan of care cert/re-cert     Problem List Patient Active Problem List   Diagnosis Date Noted  . Bilateral bunions 08/21/2016  . Seasonal allergies 07/06/2016  . Asthma 07/06/2016  . Sjogren's syndrome (HCC) 07/06/2016  . DDD (degenerative disc disease), lumbar 07/06/2016  . Bilateral primary osteoarthritis of knee 07/06/2016  . Osteoarthritis of both hands 07/06/2016  . Myofacial muscle pain 07/06/2016  . DDD lumbar spine with spinal stenosis 07/06/2016  . Abnormality of gait 06/26/2016  . Chronic pain of right ankle 06/26/2016  . Osteoarthritis of left hip 02/24/2016  . Status post left hip replacement 02/24/2016  . Varicose veins 04/20/2015  . Lipidemia 04/20/2015  . Rheumatoid arthritis (HCC) 11/20/2013  . Low back pain 11/20/2013  . HTN (hypertension) 11/20/2013  . GERD (gastroesophageal reflux disease) 11/20/2013  . Bladder spasm 11/20/2013  . Hyperlipidemia 11/20/2013  . Status post right hip replacement 11/20/2013  . Bilateral swelling of feet   . Bruises easily     Eulis Foster, PT 10/31/16 12:33 PM   [facility] [address] [place], [place], [address] Phone: [phone]   Fax:  [phone]  Name: Kathy Duran MRN: [id number] Date of  Birth: October 06, 1945

## 2016-11-06 ENCOUNTER — Ambulatory Visit: Payer: Medicare PPO | Admitting: Physical Therapy

## 2016-11-06 ENCOUNTER — Encounter: Payer: Self-pay | Admitting: Physical Therapy

## 2016-11-06 DIAGNOSIS — R2689 Other abnormalities of gait and mobility: Secondary | ICD-10-CM | POA: Diagnosis not present

## 2016-11-06 DIAGNOSIS — R279 Unspecified lack of coordination: Secondary | ICD-10-CM

## 2016-11-06 DIAGNOSIS — M6281 Muscle weakness (generalized): Secondary | ICD-10-CM | POA: Diagnosis not present

## 2016-11-06 DIAGNOSIS — M25652 Stiffness of left hip, not elsewhere classified: Secondary | ICD-10-CM | POA: Diagnosis not present

## 2016-11-06 NOTE — Therapy (Signed)
[facility] [address] [place], [place], [address] Phone: [phone]   Fax:  [phone]  Physical Therapy Treatment  Patient Details  Name: Kathy Duran MRN: [id number] Date of Birth: 20-Oct-1945 Referring Provider: Dr. Myna Hidalgo  Encounter Date: 11/06/2016      PT End of Session - 11/06/16 1053    Visit Number 14   Number of Visits 19   Date for PT Re-Evaluation 12/26/16   Authorization Type medicare g-code on 19th visit; kx modifier on 15th visit   PT Start Time 1015   PT Stop Time 1100   PT Time Calculation (min) 45 min   Activity Tolerance Patient tolerated treatment well   Behavior During Therapy [facility] for tasks assessed/performed      Past Medical History:  Diagnosis Date  . Anxiety   . Arthritis    left hip  . Asthma    Inhalers for tx  . Bilateral primary osteoarthritis of knee 07/06/2016   Moderate  . Bilateral swelling of feet    bilateral ankle and feet edema  . Bruises easily   . DDD (degenerative disc disease), lumbar 07/06/2016  . Hypertension   . Myofacial muscle pain 07/06/2016  . Osteoarthritis of both hands 07/06/2016  . Seasonal allergies 07/06/2016  . Sinus complaint   . Sjogren's syndrome (HCC)    dry eyes  . Tenonitis   . Urinary incontinence    occ. an issue wears pads    Past Surgical History:  Procedure Laterality Date  . ABDOMINAL HYSTERECTOMY    . BACK SURGERY    . BREAST SURGERY Right    cyst drainage  . SHOULDER SURGERY Right    right shoulder tendon and rotator cuff repair  . TOTAL HIP ARTHROPLASTY Right 2006  . TOTAL HIP ARTHROPLASTY Left 02/24/2016   Procedure: LEFT TOTAL HIP ARTHROPLASTY ANTERIOR APPROACH;  Surgeon: Kathryne Hitch, MD;  Location: [facility];  Service: Orthopedics;  Laterality: Left;    There were no vitals filed for this visit.      Subjective Assessment - 11/06/16 1019    Subjective  Two out of 7 days I have had a gush of  urine instead of every day.  I am starting to get the control I need.    Patient Stated Goals increase strength to decrease urinary leakage   Currently in Pain? Yes   Pain Score 3    Pain Location Knee   Pain Orientation Right;Left   Pain Descriptors / Indicators Aching   Pain Type Chronic pain   Pain Radiating Towards left hip   Pain Onset More than a month ago   Pain Frequency Intermittent   Aggravating Factors  cold weather   Pain Relieving Factors ice at night   Multiple Pain Sites No                      Pelvic Floor Special Questions - 11/06/16 0001    Pelvic Floor Internal Exam Patient confirms identification and approves PT to assess pelvic muscle integrity   Exam Type Vaginal           [facility] Adult PT Treatment/Exercise - 11/06/16 0001      Exercises   Exercises Other Exercises   Other Exercises  sit on blue physioball- bouncing with pelvic floor contraction, contract with diagonal movement of the pelvic floor, , sit to stand and back with pelvic floor contraction; pelvic sway with pelvic floor contraction  Knee/Hip Exercises: Aerobic   Nustep 10 min seat 10; no arms level 3     Manual Therapy   Manual Therapy Internal Pelvic Floor   Internal Pelvic Floor left puborectalis, left coccygeus, left ischiococcygeus                PT Education - 11/06/16 1053    Education provided No          PT Short Term Goals - 10/30/16 1013      PT SHORT TERM GOAL #1   Title independent with Initial HEP   Status Achieved     PT SHORT TERM GOAL #2   Title pelvic pain with sleeping decreased >/= 25% due to increased tissue mobility   Status Achieved     PT SHORT TERM GOAL #3   Title urinary leakage decreased >/= 25% with transitional movements due to ability to not bear down with pelvic floor contraction   Status Achieved     PT SHORT TERM GOAL #4   Title ambulate with increased hip extension and abduction due to increased tissue mobility    Status Achieved           PT Long Term Goals - 11/06/16 1021      PT LONG TERM GOAL #1   Title independent with HEP   Time 8   Period Weeks   Status On-going     PT LONG TERM GOAL #2   Title urinary leakage decreased >/= 75% due to pelvic floor strength >/= 4/5 due to decreased tightness in pelvic floor muscles   Time 8   Period Weeks   Status On-going     PT LONG TERM GOAL #3   Title not wake up in the middle of the night due to tissue mobility in abdominal and left hip   Time 8   Period Weeks   Status Achieved     PT LONG TERM GOAL #4   Title FOTO score is </= 48% limitation   Time 8   Period Weeks   Status On-going               Plan - 11/06/16 1054    Clinical Impression Statement Patient has trigger points in the left pelvic floor muscles.  Patient has a gush of urine leak 2 out of 7 nights now instead of every night.  Patient pelvic floor strength is still 4/5. Patient will benefit from skilled therapy to work on pelvic floor muscles to improve urinary leakage.    Rehab Potential Excellent   Clinical Impairments Affecting Rehab Potential right posterior THR 2006; Left anterior hip replacement 02/24/2016; back surgery   PT Duration 8 weeks   PT Treatment/Interventions Dry needling;Taping;Biofeedback;Electrical Stimulation;Cryotherapy;Ultrasound;Moist Heat;Therapeutic activities;Therapeutic exercise;Neuromuscular re-education;Patient/family education;Passive range of motion;Scar mobilization;Manual techniques   PT Next Visit Plan pelvic floor contraction with sit to stand, stand to walk , internal soft tissue work   PT Home Exercise Plan progress as needed   Consulted and Agree with Plan of Care Patient      Patient will benefit from skilled therapeutic intervention in order to improve the following deficits and impairments:  Abnormal gait, Difficulty walking, Pain, Decreased strength, Decreased mobility, Increased muscle spasms, Increased fascial restricitons,  Decreased coordination  Visit Diagnosis: Muscle weakness (generalized)  Unspecified lack of coordination     Problem List Patient Active Problem List   Diagnosis Date Noted  . Bilateral bunions 08/21/2016  . Seasonal allergies 07/06/2016  . Asthma 07/06/2016  . Sjogren's  syndrome (HCC) 07/06/2016  . DDD (degenerative disc disease), lumbar 07/06/2016  . Bilateral primary osteoarthritis of knee 07/06/2016  . Osteoarthritis of both hands 07/06/2016  . Myofacial muscle pain 07/06/2016  . DDD lumbar spine with spinal stenosis 07/06/2016  . Abnormality of gait 06/26/2016  . Chronic pain of right ankle 06/26/2016  . Osteoarthritis of left hip 02/24/2016  . Status post left hip replacement 02/24/2016  . Varicose veins 04/20/2015  . Lipidemia 04/20/2015  . Rheumatoid arthritis (HCC) 11/20/2013  . Low back pain 11/20/2013  . HTN (hypertension) 11/20/2013  . GERD (gastroesophageal reflux disease) 11/20/2013  . Bladder spasm 11/20/2013  . Hyperlipidemia 11/20/2013  . Status post right hip replacement 11/20/2013  . Bilateral swelling of feet   . Bruises easily     Eulis Foster, PT 11/06/16 10:59 AM   [facility] [address] [place], [place], [address] Phone: [phone]   Fax:  [phone]  Name: Kathy Duran MRN: [id number] Date of Birth: Aug 24, 1945

## 2016-11-13 ENCOUNTER — Ambulatory Visit: Payer: Medicare PPO | Admitting: Physical Therapy

## 2016-11-13 ENCOUNTER — Encounter: Payer: Self-pay | Admitting: Physical Therapy

## 2016-11-13 DIAGNOSIS — R2689 Other abnormalities of gait and mobility: Secondary | ICD-10-CM | POA: Diagnosis not present

## 2016-11-13 DIAGNOSIS — M6281 Muscle weakness (generalized): Secondary | ICD-10-CM

## 2016-11-13 DIAGNOSIS — M25652 Stiffness of left hip, not elsewhere classified: Secondary | ICD-10-CM | POA: Diagnosis not present

## 2016-11-13 DIAGNOSIS — R279 Unspecified lack of coordination: Secondary | ICD-10-CM

## 2016-11-13 NOTE — Therapy (Addendum)
[facility] [address] [place], [place], [address] Phone: [phone]   Fax:  [phone]  Physical Therapy Treatment  Patient Details  Name: Kathy Duran MRN: [id number] Date of Birth: [birth year] Referring Provider: Dr. Myna Hidalgo  Encounter Date: 11/13/2016      PT End of Session - 11/13/16 1407    Visit Number 15   Number of Visits 19   Date for PT Re-Evaluation 12/26/16   Authorization Type medicare g-code on 19th visit; kx modifier on 15th visit   PT Start Time 1400   PT Stop Time 1440   PT Time Calculation (min) 40 min   Activity Tolerance Patient tolerated treatment well   Behavior During Therapy [facility] for tasks assessed/performed      Past Medical History:  Diagnosis Date  . Anxiety   . Arthritis    left hip  . Asthma    Inhalers for tx  . Bilateral primary osteoarthritis of knee 07/06/2016   Moderate  . Bilateral swelling of feet    bilateral ankle and feet edema  . Bruises easily   . DDD (degenerative disc disease), lumbar 07/06/2016  . Hypertension   . Myofacial muscle pain 07/06/2016  . Osteoarthritis of both hands 07/06/2016  . Seasonal allergies 07/06/2016  . Sinus complaint   . Sjogren's syndrome (HCC)    dry eyes  . Tenonitis   . Urinary incontinence    occ. an issue wears pads    Past Surgical History:  Procedure Laterality Date  . ABDOMINAL HYSTERECTOMY    . BACK SURGERY    . BREAST SURGERY Right    cyst drainage  . SHOULDER SURGERY Right    right shoulder tendon and rotator cuff repair  . TOTAL HIP ARTHROPLASTY Right 2006  . TOTAL HIP ARTHROPLASTY Left 02/24/2016   Procedure: LEFT TOTAL HIP ARTHROPLASTY ANTERIOR APPROACH;  Surgeon: Kathryne Hitch, MD;  Location: [facility];  Service: Orthopedics;  Laterality: Left;    There were no vitals filed for this visit.      Subjective Assessment - 11/13/16 1405    Subjective I wake up 2 times in the middle of the  night to urinate due to drinking alot of water. Most of this week I have had a dry pad. Urinary leakage during the day has decreased by 65%.    Patient Stated Goals increase strength to decrease urinary leakage   Currently in Pain? No/denies                      Pelvic Floor Special Questions - 11/13/16 0001    Pelvic Floor Internal Exam Patient confirms identification and approves PT to assess pelvic muscle integrity   Exam Type Vaginal   Palpation No tightness therefore no soft tissue work that needs to be done. Patient will contract her pelvic floor but sometimes she will bulge the pelvic floor muscles when she is suppose to contract   Strength good squeeze, good lift, able to hold agaisnt strong resistance           [facility] Adult PT Treatment/Exercise - 11/13/16 0001      Self-Care   Self-Care Other Self-Care Comments   Other Self-Care Comments  education on vaginal moisturizers     Knee/Hip Exercises: Aerobic   Nustep 10 min seat 10; no arms level 3     Knee/Hip Exercises: Standing   Other Standing Knee Exercises sit to stand then walk with ball between  her knees and contract pelvic floor.     Knee/Hip Exercises: Seated   Sit to Sand 1 set;10 reps;without UE support;Other (comment)  squeeze ball between her knees; contract pelvic     Knee/Hip Exercises: Supine   Other Supine Knee/Hip Exercises supine to sit with ball between knees and pelvic floor contraction 5x with VC to breath     Manual Therapy   Manual Therapy Internal Pelvic Floor                PT Education - 11/13/16 1437    Education provided Yes   Education Details moisturizers vaginal   Person(s) Educated Patient   Methods Explanation;Handout   Comprehension Verbalized understanding          PT Short Term Goals - 10/30/16 1013      PT SHORT TERM GOAL #1   Title independent with Initial HEP   Status Achieved     PT SHORT TERM GOAL #2   Title pelvic pain with sleeping  decreased >/= 25% due to increased tissue mobility   Status Achieved     PT SHORT TERM GOAL #3   Title urinary leakage decreased >/= 25% with transitional movements due to ability to not bear down with pelvic floor contraction   Status Achieved     PT SHORT TERM GOAL #4   Title ambulate with increased hip extension and abduction due to increased tissue mobility   Status Achieved           PT Long Term Goals - 11/13/16 1408      PT LONG TERM GOAL #1   Title independent with HEP   Time 8   Period Weeks   Status On-going  still learning     PT LONG TERM GOAL #2   Title urinary leakage decreased >/= 75% due to pelvic floor strength >/= 4/5 due to decreased tightness in pelvic floor muscles   Time 8   Period Weeks   Status On-going  65% better     PT LONG TERM GOAL #3   Title not wake up in the middle of the night due to tissue mobility in abdominal and left hip   Time 8   Period Weeks   Status Achieved     PT LONG TERM GOAL #4   Title FOTO score is </= 48% limitation   Time 8   Period Weeks   Status On-going               Plan - 11/13/16 1438    Clinical Impression Statement Patient reports her urinary leakage is better by 65%. There are some days her pad is dry. Patient did not have tenderness or tightness in the vaginal muscles.  Patient pelvic floor strength is 4/5 but some timse she will bugle her pelvic floor muscles instead of contraction.  Patient was educated on vaginal moisturizers to reduce dryness from her Sjorens.  Patient will benefit from skilled therapy to reduce urinary leakage due to improved coordination.    Rehab Potential Excellent   Clinical Impairments Affecting Rehab Potential right posterior THR 2006; Left anterior hip replacement 02/24/2016; back surgery   PT Frequency 1x / week   PT Treatment/Interventions Dry needling;Taping;Biofeedback;Electrical Stimulation;Cryotherapy;Ultrasound;Moist Heat;Therapeutic activities;Therapeutic  exercise;Neuromuscular re-education;Patient/family education;Passive range of motion;Scar mobilization;Manual techniques   PT Next Visit Plan pelvic floor contraction with sit to stand, stand to walk , review HEP; KX modifier   PT Home Exercise Plan progress as needed   Consulted and Agree  with Plan of Care Patient      Patient will benefit from skilled therapeutic intervention in order to improve the following deficits and impairments:  Abnormal gait, Difficulty walking, Pain, Decreased strength, Decreased mobility, Increased muscle spasms, Increased fascial restricitons, Decreased coordination  Visit Diagnosis: Muscle weakness (generalized)  Unspecified lack of coordination     Problem List Patient Active Problem List   Diagnosis Date Noted  . Bilateral bunions 08/21/2016  . Seasonal allergies 07/06/2016  . Asthma 07/06/2016  . Sjogren's syndrome (HCC) 07/06/2016  . DDD (degenerative disc disease), lumbar 07/06/2016  . Bilateral primary osteoarthritis of knee 07/06/2016  . Osteoarthritis of both hands 07/06/2016  . Myofacial muscle pain 07/06/2016  . DDD lumbar spine with spinal stenosis 07/06/2016  . Abnormality of gait 06/26/2016  . Chronic pain of right ankle 06/26/2016  . Osteoarthritis of left hip 02/24/2016  . Status post left hip replacement 02/24/2016  . Varicose veins 04/20/2015  . Lipidemia 04/20/2015  . Rheumatoid arthritis (HCC) 11/20/2013  . Low back pain 11/20/2013  . HTN (hypertension) 11/20/2013  . GERD (gastroesophageal reflux disease) 11/20/2013  . Bladder spasm 11/20/2013  . Hyperlipidemia 11/20/2013  . Status post right hip replacement 11/20/2013  . Bilateral swelling of feet   . Bruises easily     Eulis Foster, PT 11/13/16 4:15 PM    [facility] [address] [place], [place], [address] Phone: [phone]   Fax:  [phone]  Name: Kathy Duran MRN: [id number] Date of Birth:  07-05-1946

## 2016-11-13 NOTE — Patient Instructions (Addendum)
Moisturizers . They are used in the vagina to hydrate the mucous membrane that make up the vaginal canal. . Designed to keep a more normal acid balance (ph) . Once placed in the vagina, it will last between two to three days.  . Use 2-3 times per week at bedtime and last longer than 60 min. . Ingredients to avoid is glycerin and fragrance, can increase chance of infection . Should not be used just before sex due to causing irritation . Most are gels administered either in a tampon-shaped applicator or as a vaginal suppository. They are non-hormonal.   Types of Moisturizers . Replens- drug store . Luvena- drug store . Vitamin E vaginal suppositories- Whole foods . Moist Again . Coconut oil- can break down condoms . Julva- amazon . Desert Harvest Releveum- Amazon . V-magic - cream external for the vulva area . Yes moisturizer- amazon  Things to avoid in the vaginal area . Do not use things to irritate the vulvar area . No lotions just specialized creams for the vulva area- Neogyn, V-magic, Moon Maid Vital V [place] Salve . No soaps; can use Aveeno or Calendula cleanser if needed. Must be gentle . No deodorants . No douches . Good to sleep without underwear to let the vaginal area to air out . No scrubbing: spread the lips to let warm water rinse over labias and pat dry [facility] [address], [address] [place], [place] [address] Phone # [phone] Fax [phone]  

## 2016-11-20 ENCOUNTER — Ambulatory Visit: Payer: Medicare PPO | Attending: Obstetrics & Gynecology | Admitting: Physical Therapy

## 2016-11-20 ENCOUNTER — Encounter: Payer: Self-pay | Admitting: Sports Medicine

## 2016-11-20 ENCOUNTER — Ambulatory Visit (INDEPENDENT_AMBULATORY_CARE_PROVIDER_SITE_OTHER): Payer: Medicare PPO | Admitting: Sports Medicine

## 2016-11-20 ENCOUNTER — Ambulatory Visit: Payer: Medicare PPO | Admitting: Sports Medicine

## 2016-11-20 ENCOUNTER — Encounter: Payer: Self-pay | Admitting: Physical Therapy

## 2016-11-20 DIAGNOSIS — R269 Unspecified abnormalities of gait and mobility: Secondary | ICD-10-CM

## 2016-11-20 DIAGNOSIS — M25571 Pain in right ankle and joints of right foot: Secondary | ICD-10-CM | POA: Diagnosis not present

## 2016-11-20 DIAGNOSIS — M6281 Muscle weakness (generalized): Secondary | ICD-10-CM | POA: Insufficient documentation

## 2016-11-20 DIAGNOSIS — R279 Unspecified lack of coordination: Secondary | ICD-10-CM | POA: Diagnosis not present

## 2016-11-20 DIAGNOSIS — G8929 Other chronic pain: Secondary | ICD-10-CM

## 2016-11-20 MED ORDER — HYDROCODONE-ACETAMINOPHEN 5-325 MG PO TABS: ORAL_TABLET | ORAL | 0 refills | Status: DC

## 2016-11-20 NOTE — Therapy (Signed)
[facility] [address] [place], [place], [address] Phone: [phone]   Fax:  [phone]  Physical Therapy Treatment  Patient Details  Name: Kathy Duran MRN: [id number] Date of Birth: 03/10/1946 Referring Provider: Dr. Myna Hidalgo  Encounter Date: 11/20/2016      PT End of Session - 11/20/16 0958    Visit Number 16   Number of Visits 19   Date for PT Re-Evaluation 12/26/16   Authorization Type medicare g-code on 19th visit; kx modifier on 15th visit   PT Start Time 0930   PT Stop Time 1010   PT Time Calculation (min) 40 min   Activity Tolerance Patient tolerated treatment well   Behavior During Therapy [facility] for tasks assessed/performed      Past Medical History:  Diagnosis Date  . Anxiety   . Arthritis    left hip  . Asthma    Inhalers for tx  . Bilateral primary osteoarthritis of knee 07/06/2016   Moderate  . Bilateral swelling of feet    bilateral ankle and feet edema  . Bruises easily   . DDD (degenerative disc disease), lumbar 07/06/2016  . Hypertension   . Myofacial muscle pain 07/06/2016  . Osteoarthritis of both hands 07/06/2016  . Seasonal allergies 07/06/2016  . Sinus complaint   . Sjogren's syndrome (HCC)    dry eyes  . Tenonitis   . Urinary incontinence    occ. an issue wears pads    Past Surgical History:  Procedure Laterality Date  . ABDOMINAL HYSTERECTOMY    . BACK SURGERY    . BREAST SURGERY Right    cyst drainage  . SHOULDER SURGERY Right    right shoulder tendon and rotator cuff repair  . TOTAL HIP ARTHROPLASTY Right 2006  . TOTAL HIP ARTHROPLASTY Left 02/24/2016   Procedure: LEFT TOTAL HIP ARTHROPLASTY ANTERIOR APPROACH;  Surgeon: Kathryne Hitch, MD;  Location: [facility];  Service: Orthopedics;  Laterality: Left;    There were no vitals filed for this visit.      Subjective Assessment - 11/20/16 0937    Subjective I feel good overall. Discomfort from  arthritis in legs with walking.    Patient Stated Goals increase strength to decrease urinary leakage   Currently in Pain? No/denies                      Pelvic Floor Special Questions - 11/20/16 0001    Urinary Leakage Yes   How often no gush anymore   Pad use 1 pad           [facility] Adult PT Treatment/Exercise - 11/20/16 0001      Neuro Re-ed    Neuro Re-ed Details  supine to sit with ball between legs to contract the pelvic floor and breath 5 times; sit to stand and back with ball between legs with pelvic floor contraction and breathing and control,      Knee/Hip Exercises: Aerobic   Nustep ---------------------------                PT Education - 11/20/16 1010    Education provided Yes   Education Details reviewed HEP   Person(s) Educated Patient   Methods Explanation;Demonstration;Handout;Verbal cues   Comprehension Verbalized understanding;Returned demonstration          PT Short Term Goals - 10/30/16 1013      PT SHORT TERM GOAL #1   Title independent with Initial HEP  Status Achieved     PT SHORT TERM GOAL #2   Title pelvic pain with sleeping decreased >/= 25% due to increased tissue mobility   Status Achieved     PT SHORT TERM GOAL #3   Title urinary leakage decreased >/= 25% with transitional movements due to ability to not bear down with pelvic floor contraction   Status Achieved     PT SHORT TERM GOAL #4   Title ambulate with increased hip extension and abduction due to increased tissue mobility   Status Achieved           PT Long Term Goals - 11/20/16 0946      PT LONG TERM GOAL #1   Title independent with HEP   Baseline --   Time 8   Period Weeks   Status On-going  still learning     PT LONG TERM GOAL #2   Title urinary leakage decreased >/= 75% due to pelvic floor strength >/= 4/5 due to decreased tightness in pelvic floor muscles   Time 8   Period Weeks   Status On-going  1 time with leakage at night last  week     PT LONG TERM GOAL #3   Title not wake up in the middle of the night due to tissue mobility in abdominal and left hip   Baseline ---   Time 8   Period Weeks   Status Achieved     PT LONG TERM GOAL #4   Title FOTO score is </= 48% limitation   Time 8   Period Weeks   Status On-going     PT LONG TERM GOAL #5   Baseline --               Plan - 11/20/16 0959    Clinical Impression Statement Patient only leaked 1 time since last treatment.  Patient only has to change pad 1 time.  Patient is able to drink more water without having to run to the bathroom as often. Patient gets up from a chair fast making it easier to leak urine. Patient will benefit from skilled therapy to reduce urinary leakage due to improved coordination.    Rehab Potential Excellent   Clinical Impairments Affecting Rehab Potential right posterior THR 2006; Left anterior hip replacement 02/24/2016; back surgery   PT Frequency 1x / week   PT Duration 8 weeks   PT Treatment/Interventions Dry needling;Taping;Biofeedback;Electrical Stimulation;Cryotherapy;Ultrasound;Moist Heat;Therapeutic activities;Therapeutic exercise;Neuromuscular re-education;Patient/family education;Passive range of motion;Scar mobilization;Manual techniques   PT Next Visit Plan pelvic floor contraction with sit to stand, stand to walk , review HEP; KX modifier; possible discharge   PT Home Exercise Plan progress as needed   Consulted and Agree with Plan of Care Patient      Patient will benefit from skilled therapeutic intervention in order to improve the following deficits and impairments:  Abnormal gait, Difficulty walking, Pain, Decreased strength, Decreased mobility, Increased muscle spasms, Increased fascial restricitons, Decreased coordination  Visit Diagnosis: Muscle weakness (generalized)  Unspecified lack of coordination     Problem List Patient Active Problem List   Diagnosis Date Noted  . Bilateral bunions  08/21/2016  . Seasonal allergies 07/06/2016  . Asthma 07/06/2016  . Sjogren's syndrome (HCC) 07/06/2016  . DDD (degenerative disc disease), lumbar 07/06/2016  . Bilateral primary osteoarthritis of knee 07/06/2016  . Osteoarthritis of both hands 07/06/2016  . Myofacial muscle pain 07/06/2016  . DDD lumbar spine with spinal stenosis 07/06/2016  . Abnormality of gait 06/26/2016  .  Chronic pain of right ankle 06/26/2016  . Osteoarthritis of left hip 02/24/2016  . Status post left hip replacement 02/24/2016  . Varicose veins 04/20/2015  . Lipidemia 04/20/2015  . Rheumatoid arthritis (HCC) 11/20/2013  . Low back pain 11/20/2013  . HTN (hypertension) 11/20/2013  . GERD (gastroesophageal reflux disease) 11/20/2013  . Bladder spasm 11/20/2013  . Hyperlipidemia 11/20/2013  . Status post right hip replacement 11/20/2013  . Bilateral swelling of feet   . Bruises easily     Eulis Foster, PT 11/20/16 10:12 AM   [facility] [address] [place], [place], [address] Phone: [phone]   Fax:  [phone]  Name: Kathy Duran MRN: [id number] Date of Birth: [birth year]

## 2016-11-20 NOTE — Assessment & Plan Note (Signed)
This is improved with orthotic support Needs more support for RT ankle Will try compression sleeve for walking  Reck 2 mos

## 2016-11-20 NOTE — Patient Instructions (Addendum)
Toileting Techniques for Bowel Movements (Defecation)  Using your belly (abdomen) and pelvic floor muscles to have a bowel movement is usually instinctive.  Sometimes people can have problems with these muscles and have to relearn proper defecation (emptying) techniques.  If you have weakness in your muscles, organs that are falling out, decreased sensation in your pelvis, or ignore your urge to go, you may find yourself straining to have a bowel movement.  You are straining if you are:   holding your breath or taking in a huge gulp of air and holding it   keeping your lips and jaw tensed and closed tightly  turning red in the face because of excessive pushing or forcing  developing or worsening your  hemorrhoids  getting faint while pushing  not emptying completely and have to defecate many times a day  If you are straining, you are actually making it harder for yourself to have a bowel movement.  Many people find they are pulling up with the pelvic floor muscles and closing off instead of opening the anus. Due to lack pelvic floor relaxation and coordination the abdominal muscles, one has to work harder to push the feces out.  Many people have never been taught how to defecate efficiently and effectively.  Notice what happens to your body when you are having a bowel movement.  While you are sitting on the toilet pay attention to the following areas:  Jaw and mouth position  Angle of your hips    Whether your feet touch the ground or not  Arm placement   Spine position  Waist  Belly tension  Anus (opening of the anal canal)  An Evacuation/Defecation Plan   Here are the 4 basic points:  1. Lean forward enough for your elbows to rest on your knees 2. Support your feet on the floor or use a low stool if your feet don't touch the floor  3. Push out your belly as if you have swallowed a beach ball-you should feel a widening of your waist 4. Open and relax your pelvic floor  muscles, rather than tightening around the anus      The following conditions my require modifications to your toileting posture:   If you have had surgery in the past that limits your back, hip, pelvic, knee or ankle flexibility  Constipation   Your healthcare practitioner may make the following additional suggestions and adjustments:  1. Sit on the toilet  a)   Make sure your feet are supported. b)   Notice your hip angle and spine position-most people find it effective to lean forward or raise their knees, which can help the muscles around the anus to relax  c)   When you lean forward, place your forearms on your thighs for support  2. Relax suggestions a)   Breath deeply in through your nose and out slowly through your mouth as if you are smelling the flowers and blowing out the candles. b)   To become aware of how to relax your muscles, contracting and releasing muscles can be helpful.  Pull your pelvic floor muscles in tightly by using the image of holding back gas, or closing around the anus (visualize making a circle smaller) and lifting the anus up and in.  Then release the muscles and your anus should drop down and feel open. Repeat 5 times ending with the feeling of relaxation. c)   Keep your pelvic floor muscles relaxed; let your belly bulge out. d)  The digestive tract starts at the mouth and ends at the anal opening, so be sure torelax both ends of the tube.  Place your tongue on the roof of your mouth withyour teeth separated.  This helps relax your mouth and will help to relax the anus at the same time.  3. Empty (defecation) a)   Keep your pelvic floor and sphincter relaxed, then bulge your anal muscles.  Make the anal opening wide.  b)   Stick your belly out as if you have swallowed a beach ball. c)   Make your belly wall hard using your belly muscles while continuing to breathe. Doing this makes it easier to open your anus. d)   Breath out and give a grunt  (or try using other sounds such as ahhhh, shhhhh, ohhhh or grrrrrrr).  4)   Finish a)   As you finish your bowel movement, pull the pelvic floor muscles up and in.  This will leave your anus in the proper place rather than remaining pushed out and down. If you leave your anus pushed out and down, it will start to feel as though that is normal and give you incorrect signals about needing to have a bowel movement.    Eulis Foster, PT [facility] [address] [place], [place] [address] Sitting    Sit comfortably. Allow body's muscles to relax. Place hands on belly. Inhale slowly and deeply for 3___ seconds, so hands move out. Then take _3__ seconds to exhale. Repeat _5__ times. Do 2___ times a day. Do during bowel movement Copyright  VHI. All rights reserved.   Supine to Sit: Phase 4    On back, squeeze pelvic floor and hold. Inhale. Exhale. Roll to side. Push up on elbows as feet are off bed. Sit up. Relax. Repeat _5__ times. Do _2__ times a day. When get up out of bed in morning and before you go to bed.    Copyright  VHI. All rights reserved.   Sit to Stand: Phase 3    Sitting, squeeze pelvic floor and hold. Lean trunk forward. Push up on arms and stand up. Relax. Then do when you go from stand to sit.  Do every time you get out of a chair.    Copyright  VHI. All rights reserved.  Chair Sitting    Sit at edge of seat, spine straight, one leg extended. Put a hand on each thigh and bend forward from the hip, keeping spine straight. Allow hand on extended leg to reach toward toes. Support upper body with other arm. Hold _30__ seconds. Repeat _2__ times per session. Do __2_ sessions per day.   Quick Contraction: Gravity Resisted (Sitting)    Sitting, quickly squeeze then fully relax pelvic floor. Perform __1_ sets of __10_. Rest for _1__ seconds between sets. Do _2__ times a day.  Copyright  VHI. All rights reserved.  Slow  Contraction: Gravity Resisted (Sitting)    Sitting, slowly squeeze pelvic floor for _10__ seconds. Rest for 5___ seconds. Repeat _10__ times. Do _2__ times a day.  Copyright  VHI. All rights reserved.  [facility] [address] [place], [place] [address] Phone # [phone] Fax [phone]

## 2016-11-20 NOTE — Assessment & Plan Note (Signed)
With XR showing only mild boney change pain may be coming from soft tissue swelling  Trial on compression sleeve

## 2016-11-20 NOTE — Progress Notes (Signed)
Subjective: Patient is 71 year old female s/p right hip replacement in 02/2016 who presents 3 months after orthotics for follow-up.  She completed extensive physical therapy and then was referred for orthotics and partial heel lift for limb-length discrepancy. She reports that the inserts are working well, she is more active to 4K-5K steps per day, (was at only 1k steps per day) and has lost about 8-10 lbs.   ROS She reports that her ankles and knees are stiff in the mornings and get increasingly swollen over the day.  She describes pain as dull soreness with point tenderness in ankle over R medial malleolus.   Past She is taking advil 2x daily at 2am and 2pm for pain which provides minimal relief. Tried tramadol with no relief Was able to sleep at HS with only 1 norco   Objective: BP (!) 137/100   Ht 5\' 5"  (1.651 m)   Wt 200 lb (90.7 kg)   BMI 33.28 kg/m  General: Well-appearing, overweight woman, sitting comfortable on exam table. Not in acute distress. Extremities: Warm and well perfused. Edema to mid calves bilaterally that is non-pitting. 2+ pulses in DP bilaterally. MSK: 5/5 strength on dorsiflexion, plantar flexion, eversion and inversion of bilateral feet. +subtalar movement on right ankle  Subluxation of RT subtalar joint with turnout of foot by 10 deg Right foot calcaneal valgus.   Separation medial  column of right foot between 2-3 toes, normal on left.  Knees genu valgus bilaterally.  Good internal hip rotation with reduced external rotation bilaterally.  Appears to have some pelvicl rotation at baseline.  5/5 strength on knee flexion and extension with minimal crepitus.  Gait shows genu valgus with pronation of right foot and ankle.  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 She reports increase in  activity level and some weight loss however has continued pain and soreness in right knee and ankle that are not relieved by advil.   Plan: Ankle instability: - compression sleeve on right ankle, for daily use - continue walking with orthotic inserts. Increase by 500 steps/day every 2 weeks with goal of 7.5K/day - follow up 11/27/16 with physical therapy  Pain control: - hydrocodone/acetaminophen 5-325 mg tablet, take 1/2 pill at night for pain relief throughout night

## 2016-11-26 ENCOUNTER — Other Ambulatory Visit: Payer: Self-pay | Admitting: Internal Medicine

## 2016-11-27 ENCOUNTER — Encounter: Payer: Self-pay | Admitting: Physical Therapy

## 2016-12-04 ENCOUNTER — Encounter: Payer: Self-pay | Admitting: Physical Therapy

## 2016-12-04 ENCOUNTER — Ambulatory Visit: Payer: Medicare PPO | Admitting: Physical Therapy

## 2016-12-04 DIAGNOSIS — M6281 Muscle weakness (generalized): Secondary | ICD-10-CM | POA: Diagnosis not present

## 2016-12-04 DIAGNOSIS — R279 Unspecified lack of coordination: Secondary | ICD-10-CM

## 2016-12-04 NOTE — Therapy (Signed)
[facility] [address], [place], [place], [address] Phone: [phone]   Fax:  [phone]  Physical Therapy Treatment  Patient Details  Name: Kathy Duran MRN: [id number] Date of Birth: April 22, 1946 Referring Provider: Dr. Janyth Pupa  Encounter Date: 12/04/2016      PT End of Session - 12/04/16 1024    Visit Number 17   Number of Visits 19   Date for PT Re-Evaluation 12/26/16   Authorization Type medicare g-code on 19th visit; kx modifier on 15th visit   PT Start Time 1015   PT Stop Time 1053   PT Time Calculation (min) 38 min   Activity Tolerance Patient tolerated treatment well   Behavior During Therapy [facility] for tasks assessed/performed      Past Medical History:  Diagnosis Date  . Anxiety   . Arthritis    left hip  . Asthma    Inhalers for tx  . Bilateral primary osteoarthritis of knee 07/06/2016   Moderate  . Bilateral swelling of feet    bilateral ankle and feet edema  . Bruises easily   . DDD (degenerative disc disease), lumbar 07/06/2016  . Hypertension   . Myofacial muscle pain 07/06/2016  . Osteoarthritis of both hands 07/06/2016  . Seasonal allergies 07/06/2016  . Sinus complaint   . Sjogren's syndrome ([place])    dry eyes  . Tenonitis   . Urinary incontinence    occ. an issue wears pads    Past Surgical History:  Procedure Laterality Date  . ABDOMINAL HYSTERECTOMY    . BACK SURGERY    . BREAST SURGERY Right    cyst drainage  . SHOULDER SURGERY Right    right shoulder tendon and rotator cuff repair  . TOTAL HIP ARTHROPLASTY Right 2006  . TOTAL HIP ARTHROPLASTY Left 02/24/2016   Procedure: LEFT TOTAL HIP ARTHROPLASTY ANTERIOR APPROACH;  Surgeon: Mcarthur Rossetti, MD;  Location: [facility];  Service: Orthopedics;  Laterality: Left;    There were no vitals filed for this visit.      Subjective Assessment - 12/04/16 1018    Subjective I only change my pad for comfort instead of  urinary leakage.  I am 75% better with urinary leakage.    Patient Stated Goals increase strength to decrease urinary leakage   Currently in Pain? No/denies            [facility] PT Assessment - 12/04/16 0001      Assessment   Medical Diagnosis R10.2 pelvic pain   Referring Provider Dr. Janyth Pupa   Onset Date/Surgical Date 02/24/16   Prior Therapy Yes     Precautions   Precautions None     Restrictions   Weight Bearing Restrictions No     Balance Screen   Has the patient fallen in the past 6 months No   Has the patient had a decrease in activity level because of a fear of falling?  No   Is the patient reluctant to leave their home because of a fear of falling?  No     Home Environment   Living Environment Private residence   Available Help at Discharge Family   Type of [place] One level     Prior Function   Level of [place] Retired     Associate Professor   Overall Cognitive Status Within Functional Limits for tasks assessed     Observation/Other Assessments   Focus on Therapeutic  Outcomes (FOTO)  4% limitation     Posture/Postural Control   Posture/Postural Control No significant limitations     Strength   Right Hip External Rotation  4+/5   Left Hip Extension 5/5   Left Hip External Rotation 5/5   Right Knee Extension 5/5   Left Knee Extension 5/5     Trendelenburg Test   Findings Negative   Side Left   Comments no hip drop     Ambulation/Gait   Ambulation/Gait No                  Pelvic Floor Special Questions - 12/04/16 0001    Pad use 1 pad  just in case light pad           [facility] Adult PT Treatment/Exercise - 12/04/16 0001      Transfers   Transfers Not assessed     Neuro Re-ed    Neuro Re-ed Details  pelvic floor contraction in sitting with quick flicks and holds     Knee/Hip Exercises: Stretches   Active Hamstring Stretch Left;Right;2 reps;30 seconds  sitting     Knee/Hip Exercises:  Aerobic   Nustep 8 min seat 10; no arms level 3     Knee/Hip Exercises: Seated   Sit to Sand 1 set;10 reps;without UE support;Other (comment)  ; contract pelvic     Knee/Hip Exercises: Supine   Other Supine Knee/Hip Exercises supine to sit with pelvic floor and abodminal contraction 10x                PT Education - 12/04/16 1044    Education provided Yes   Education Details reviewed HEP and patient is able to return demonstration correctly   Person(s) Educated Patient   Methods Explanation;Demonstration   Comprehension Verbalized understanding;Returned demonstration          PT Short Term Goals - 12/04/16 1021      PT SHORT TERM GOAL #1   Title independent with Initial HEP   Time 4   Period Weeks   Status Achieved     PT SHORT TERM GOAL #2   Title pelvic pain with sleeping decreased >/= 25% due to increased tissue mobility   Time 4   Period Weeks   Status Achieved     PT SHORT TERM GOAL #3   Title urinary leakage decreased >/= 25% with transitional movements due to ability to not bear down with pelvic floor contraction   Time 4   Period Weeks   Status Achieved     PT SHORT TERM GOAL #4   Title ambulate with increased hip extension and abduction due to increased tissue mobility   Time 4   Period Weeks   Status Achieved           PT Long Term Goals - 12/04/16 1022      PT LONG TERM GOAL #1   Title independent with HEP   Time 8   Period Weeks   Status Achieved     PT LONG TERM GOAL #2   Title urinary leakage decreased >/= 75% due to pelvic floor strength >/= 4/5 due to decreased tightness in pelvic floor muscles   Time 8   Period Weeks   Status Achieved     PT LONG TERM GOAL #3   Title not wake up in the middle of the night due to tissue mobility in abdominal and left hip   Time 8   Period Weeks   Status Achieved  PT LONG TERM GOAL #4   Title FOTO score is </= 48% limitation   Time 8   Period Weeks   Status Achieved                Plan - 01-Jan-2017 1044    Clinical Impression Statement Patient has met all of her goals.  Patient is independent with her HEP.  Patient only wears a pad for just in case.  Patient feels her urinary leakage is 75% or more better. Patient has increased strength of hips and pelvic floor.  Patient is ready for discharge.    Rehab Potential Excellent   Clinical Impairments Affecting Rehab Potential right posterior THR 2006; Left anterior hip replacement 02/24/2016; back surgery   PT Treatment/Interventions Dry needling;Taping;Biofeedback;Electrical Stimulation;Cryotherapy;Ultrasound;Moist Heat;Therapeutic activities;Therapeutic exercise;Neuromuscular re-education;Patient/family education;Passive range of motion;Scar mobilization;Manual techniques   PT Next Visit Plan discharge to HEP   PT Home Exercise Plan Current HEP   Consulted and Agree with Plan of Care Patient      Patient will benefit from skilled therapeutic intervention in order to improve the following deficits and impairments:  Abnormal gait, Difficulty walking, Pain, Decreased strength, Decreased mobility, Increased muscle spasms, Increased fascial restricitons, Decreased coordination  Visit Diagnosis: Muscle weakness (generalized)  Unspecified lack of coordination       G-Codes - 01/01/2017 1027    Functional Assessment Tool Used (Outpatient Only) FOTO score is 4% limitation for urinary problem   Functional Limitation Other PT primary   Other PT Primary Goal Status (E7209) At least 40 percent but less than 60 percent impaired, limited or restricted   Other PT Primary Discharge Status (O7096) At least 1 percent but less than 20 percent impaired, limited or restricted      Problem List Patient Active Problem List   Diagnosis Date Noted  . Bilateral bunions 08/21/2016  . Seasonal allergies 07/06/2016  . Asthma 07/06/2016  . Sjogren's syndrome ([place]) 07/06/2016  . DDD (degenerative disc disease), lumbar  07/06/2016  . Bilateral primary osteoarthritis of knee 07/06/2016  . Osteoarthritis of both hands 07/06/2016  . Myofacial muscle pain 07/06/2016  . DDD lumbar spine with spinal stenosis 07/06/2016  . Abnormality of gait 06/26/2016  . Chronic pain of right ankle 06/26/2016  . Osteoarthritis of left hip 02/24/2016  . Status post left hip replacement 02/24/2016  . Varicose veins 04/20/2015  . Lipidemia 04/20/2015  . Rheumatoid arthritis ([place]) 11/20/2013  . Low back pain 11/20/2013  . HTN (hypertension) 11/20/2013  . GERD (gastroesophageal reflux disease) 11/20/2013  . Bladder spasm 11/20/2013  . Hyperlipidemia 11/20/2013  . Status post right hip replacement 11/20/2013  . Bilateral swelling of feet   . Bruises easily     Earlie Counts, PT 2017/01/01 10:51 AM   [facility] [address], [place], [place], [address] Phone: [phone]   Fax:  [phone]  Name: Kathy Duran MRN: [id number] Date of Birth: 1945-11-21  PHYSICAL THERAPY DISCHARGE SUMMARY  Visits from Start of Care: 17  Current functional level related to goals / functional outcomes: See above. Met all of her goals   Remaining deficits: See above.    Education / Equipment: HEP  Plan: Patient agrees to discharge.  Patient goals were met. Patient is being discharged due to meeting the stated rehab goals. Thank you for the referral. Earlie Counts, PT 2017/01/01 10:51 AM   ?????

## 2016-12-06 ENCOUNTER — Ambulatory Visit (INDEPENDENT_AMBULATORY_CARE_PROVIDER_SITE_OTHER): Payer: Medicare PPO | Admitting: Internal Medicine

## 2016-12-06 ENCOUNTER — Encounter: Payer: Self-pay | Admitting: Internal Medicine

## 2016-12-06 VITALS — BP 112/60 | HR 81 | Ht 65.0 in | Wt 209.4 lb

## 2016-12-06 DIAGNOSIS — I10 Essential (primary) hypertension: Secondary | ICD-10-CM | POA: Diagnosis not present

## 2016-12-06 DIAGNOSIS — I890 Lymphedema, not elsewhere classified: Secondary | ICD-10-CM | POA: Diagnosis not present

## 2016-12-06 DIAGNOSIS — M069 Rheumatoid arthritis, unspecified: Secondary | ICD-10-CM

## 2016-12-06 DIAGNOSIS — E782 Mixed hyperlipidemia: Secondary | ICD-10-CM | POA: Diagnosis not present

## 2016-12-06 MED ORDER — EZETIMIBE 10 MG PO TABS: 10.0000 mg | ORAL_TABLET | Freq: Every day | ORAL | 5 refills | Status: DC

## 2016-12-06 NOTE — Patient Instructions (Addendum)
Medication Instructions:  START ZETIA 10 MG DAILY  Labwork: FASTING LIPID AT OUR OFFICE IN 3 MONTHS   Testing/Procedures: NONE  Follow-Up: Your physician wants you to follow-up in: 1 year ov You will receive a reminder letter in the mail two months in advance. If you don't receive a letter, please call our office to schedule the follow-up appointment.   Any Other Special Instructions Will Be Listed Below (If Applicable). TRY THE COMPRESSION STOCKINGS  WORK HARDER ON DIET AND WEIGHT LOSS   If you need a refill on your cardiac medications before your next appointment, please call your pharmacy.

## 2016-12-06 NOTE — Progress Notes (Signed)
OFFICE NOTE  Chief Complaint:  Follow-up echocardiogram  Primary Care Physician: Georgann Housekeeper, MD  HPI:  Kathy Duran is a pleasant 71 year old female with a history of premature arthritis, hypertension, dyslipidemia, gout bladder spasm, GERD and low back pain. She's also had right hip surgery.  She is a self-referral for evaluation of lower stringy swelling. She previously seen Dr. Newt Lukes in 2007 in May that a stress test at that time. She's managed to use some compression stockings for her swelling which worked. She reports the swelling is gotten worse during the day which is on her feet but improves after she elevates it at night. She's had a limited workup of her swelling before occluding venous Dopplers which showed no evidence of a DVT in 2011. She denies any varicose veins. She has been having some problems with sores on her feet. She is seeing Dr. Leary Roca in [facility]. She denies any shortness of breath or chest pain. She has no orthopnea or PND.  Kathy Duran returns today for followup of her venous Dopplers. This did not show any evidence for venous insufficiency. There was no evidence for deep vein thrombosis. She continues to have some swelling mostly in her left foot. She is now back in a walking boot. Her renal function is normal and BNP was low indicating this is not likely heart failure. 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 In fact in her right hand it is so significant particularly toward the wrist that she is undergoing carpal tunnel surgery later this week.  Kathy Duran he was seen back in the office today in follow-up. Overall she seems to be doing fairly well although continues to have concerns about leg swelling. At times she reports some swelling in her legs including today. She's had venous Dopplers last year which failed to show any venous insufficiency. She's concerned  that there may be a cardiac etiology of this. She denies any significant shortness of breath, orthopnea or PND.  I had the pleasure seeing Mrs. Reiland back in the office today for follow-up. She underwent an echocardiogram which showed normal systolic function mild diastolic dysfunction, but no clear etiology for her lower extremity swelling. She says she's been using compression stockings with a significant improvement in her symptoms.  12/06/2016  Kathy Duran returns today for follow-up. I last saw her about 2 years ago. She recently had blood work from Dr. Eula Listen with the goal and will obtain those labs. Over the past several years she's done fairly well denies any worsening shortness of breath, chest pain or associated symptoms. She still struggles with some swelling of her legs which I suspect his lymphedema. We have done previously ultrasounds of her legs without any clear evidence for venous insufficiency. She does have some improvement with compression stockings but finds them intolerable to wear when it's hot out. Blood pressure is at goal today.  PMHx:  Past Medical History:  Diagnosis Date  . Anxiety   . Arthritis    left hip  . Asthma    Inhalers for tx  . Bilateral primary osteoarthritis of knee 07/06/2016   Moderate  . Bilateral swelling of feet    bilateral ankle and feet edema  . Bruises easily   . DDD (degenerative disc disease), lumbar 07/06/2016  . Hypertension   . Myofacial muscle pain 07/06/2016  . Osteoarthritis of both hands 07/06/2016  . Seasonal allergies  07/06/2016  . Sinus complaint   . Sjogren's syndrome ([facility])    dry eyes  . Tenonitis   . Urinary incontinence    occ. an issue wears pads    Past Surgical History:  Procedure Laterality Date  . ABDOMINAL HYSTERECTOMY    . BACK SURGERY    . BREAST SURGERY Right    cyst drainage  . SHOULDER SURGERY Right    right shoulder tendon and rotator cuff repair  . TOTAL HIP ARTHROPLASTY Right 2006  . TOTAL HIP  ARTHROPLASTY Left 02/24/2016   Procedure: LEFT TOTAL HIP ARTHROPLASTY ANTERIOR APPROACH;  Surgeon: Kathryne Hitch, MD;  Location: [facility];  Service: Orthopedics;  Laterality: Left;    FAMHx:  Family History  Problem Relation Age of Onset  . Arrhythmia Mother   . Stroke Mother   . Hyperlipidemia Mother   . Diabetes Mother   . Hypertension Mother   . Heart attack Father   . Diabetes Father   . Cancer Father   . Hypertension Father   . Hyperlipidemia Maternal Grandmother   . Hypertension Maternal Grandmother   . Heart attack Paternal Grandfather   . Stroke Paternal Grandfather   . Asthma Brother   . Emphysema Brother   . Hypertension Brother   . Diabetes Sister   . Hypertension Sister   . Hyperlipidemia Sister     SOCHx:   reports that she quit smoking about 33 years ago. She has never used smokeless tobacco. She reports that she does not drink alcohol or use drugs.  ALLERGIES:  Allergies  Allergen Reactions  . Arthrotec [Diclofenac-Misoprostol]   . Augmentin [Amoxicillin-Pot Clavulanate]   . Latex Itching  . Naproxen Nausea Only    ROS: Pertinent items noted in HPI and remainder of comprehensive ROS otherwise negative.  HOME MEDS: Current Outpatient Prescriptions  Medication Sig Dispense Refill  . albuterol (PROVENTIL) (2.5 MG/3ML) 0.083% nebulizer solution Take 2.5 mg by nebulization as needed for wheezing.    Marland Kitchen aspirin EC 325 MG EC tablet Take 1 tablet (325 mg total) by mouth 2 (two) times daily after a meal. 30 tablet 0  . atorvastatin (LIPITOR) 40 MG tablet TAKE 1 TABLET (40 MG TOTAL) BY MOUTH DAILY. 30 tablet 0  . cetirizine (ZYRTEC) 10 MG chewable tablet Chew 10 mg by mouth daily.    . Cholecalciferol (VITAMIN D-3 PO) Take 1 tablet by mouth daily.     Marland Kitchen estradiol (VIVELLE-DOT) 0.1 MG/24HR patch Place 1 patch onto the skin 2 (two) times a week.    . Fluticasone-Salmeterol (ADVAIR) 100-50 MCG/DOSE AEPB Inhale 1 puff into the lungs daily.     . Homeopathic  Products (ARNICARE EX) Apply 1 application topically daily as needed (pain).    Marland Kitchen HYDROcodone-acetaminophen (NORCO/VICODIN) 5-325 MG tablet Take 1-2 daily 60 tablet 0  . ipratropium (ATROVENT) 0.06 % nasal spray Place 2 sprays into both nostrils 4 (four) times daily. 15 mL 12  . KRILL OIL PO Take 1 tablet by mouth daily.     Marland Kitchen losartan (COZAAR) 50 MG tablet Take 50 mg by mouth daily.    . meloxicam (MOBIC) 7.5 MG tablet     . methocarbamol (ROBAXIN) 500 MG tablet Take 1 tablet (500 mg total) by mouth every 6 (six) hours as needed for muscle spasms. 60 tablet 0  . mometasone (NASONEX) 50 MCG/ACT nasal spray Place 2 sprays into the nose daily.    . Multiple Vitamin (MULTIVITAMIN) capsule Take 1 capsule by mouth daily.    Marland Kitchen  Multiple Vitamins-Minerals (MULTIVITAMIN WITH MINERALS) tablet Take 1 tablet by mouth daily.    . ondansetron (ZOFRAN-ODT) 8 MG disintegrating tablet     . pantoprazole (PROTONIX) 40 MG tablet Take 40 mg by mouth daily as needed (reflux).     . RESTASIS 0.05 % ophthalmic emulsion Place 1 drop into both eyes 2 (two) times daily.    . traMADol (ULTRAM) 50 MG tablet Take 1-2 tablets (50-100 mg total) by mouth every 6 (six) hours as needed. 60 tablet 0  . zolpidem (AMBIEN) 10 MG tablet Take 1 tablet by mouth at bedtime as needed for sleep.      No current facility-administered medications for this visit.     LABS/IMAGING: No results found for this or any previous visit (from the past 48 hour(s)). No results found.  VITALS: BP 112/60   Pulse 81   Ht 5\' 5"  (1.651 m)   Wt 209 lb 6.4 oz (95 kg)   BMI 34.85 kg/m   EXAM: General appearance: alert and no distress Neck: no carotid bruit and no JVD Lungs: clear to auscultation bilaterally Heart: regular rate and rhythm, S1, S2 normal, no murmur, click, rub or gallop Abdomen: soft, non-tender; bowel sounds normal; no masses,  no organomegaly Extremities: edema Trace bilateral Pulses: 2+ and symmetric Skin: Skin color,  texture, turgor normal. No rashes or lesions Neurologic: Grossly normal Psych: Pleasant  EKG: Normal sinus rhythm at 81  ASSESSMENT: 1. Lipidemia or possibly lymphedema - no pitting edema on exam today, but she says she has swelling that improves with compression stockings, negative dopplers for venous reflux in the past 2. Hypertension 3. Dyslipidemia 4. Rheumatoid arthritis  PLAN: 1.   Ms. Kronenwetter seems to be doing well but is at increased coronary risk due to her rheumatoid arthritis. I received labs from her primary care provider indicating that her cholesterol is elevated with total cholesterol 217, HL 77 and LDL 120. Non-HDL 140. Her goal LDL and non-HDL cholesterols are 101 and 130, respectively. As she is not reached goal on Lipitor 40 mg we discussed increasing her Lipitor but she says that she may been intolerant to high-dose Lipitor in the past. I would advise adding ezetimibe 10 mg daily to her regimen. We'll repeat a lipid profile in 3 months. Follow-up with me annually.  Chrystie Nose, MD, [facility] Attending Cardiologist [facility]  Chrystie Nose 12/06/2016, 2:59 PM

## 2016-12-25 DIAGNOSIS — N3281 Overactive bladder: Secondary | ICD-10-CM | POA: Diagnosis not present

## 2016-12-25 DIAGNOSIS — R102 Pelvic and perineal pain: Secondary | ICD-10-CM | POA: Diagnosis not present

## 2017-01-04 ENCOUNTER — Other Ambulatory Visit: Payer: Self-pay | Admitting: Rheumatology

## 2017-01-04 NOTE — Telephone Encounter (Signed)
Last Visit: 07/09/16 Next Visit: 01/24/17  Okay to refill Methocarbamol?

## 2017-01-04 NOTE — Telephone Encounter (Signed)
ok 

## 2017-01-07 ENCOUNTER — Ambulatory Visit (INDEPENDENT_AMBULATORY_CARE_PROVIDER_SITE_OTHER): Payer: Medicare PPO | Admitting: Orthopaedic Surgery

## 2017-01-07 ENCOUNTER — Ambulatory Visit (INDEPENDENT_AMBULATORY_CARE_PROVIDER_SITE_OTHER): Payer: Medicare PPO

## 2017-01-07 DIAGNOSIS — M5442 Lumbago with sciatica, left side: Secondary | ICD-10-CM | POA: Diagnosis not present

## 2017-01-07 DIAGNOSIS — Z96642 Presence of left artificial hip joint: Secondary | ICD-10-CM | POA: Diagnosis not present

## 2017-01-07 MED ORDER — HYDROCODONE-ACETAMINOPHEN 5-325 MG PO TABS: ORAL_TABLET | ORAL | 0 refills | Status: DC

## 2017-01-07 MED ORDER — METHYLPREDNISOLONE 4 MG PO TABS: ORAL_TABLET | ORAL | 0 refills | Status: DC

## 2017-01-07 NOTE — Progress Notes (Signed)
Office Visit Note   Patient: Kathy Duran           Date of Birth: 01/10/1946           MRN: [id number] Visit Date: 01/07/2017              Requested by: Georgann Housekeeper, MD [address] [place], [place] [address] PCP: Georgann Housekeeper, MD   Assessment & Plan: Visit Diagnoses:  1. History of left hip replacement   2. Acute left-sided low back pain with left-sided sciatica     Plan: I showed her back extension exercises and eager to try 3 sets at a time twice a day. I'll have her take a six-day steroid taper as well as a muscle relaxant her meloxicam and some hydrocodone as needed. I like see her back in just 2 weeks to see how she doing overall. She'll stay out of her water aerobics in the interim. Obviously if things are worsening at like to obtain an MRI. We'll see how she is doing in 2 weeks from now. All questions were encouraged and answered.  Follow-Up Instructions: Return in about 2 weeks (around 01/21/2017).   Orders:  Orders Placed This Encounter  Procedures  . XR HIP UNILAT W OR W/O PELVIS 1V LEFT  . XR Lumbar Spine 2-3 Views   Meds ordered this encounter  Medications  . methylPREDNISolone (MEDROL) 4 MG tablet    Sig: Medrol dose pack. Take as instructed    Dispense:  21 tablet    Refill:  0  . HYDROcodone-acetaminophen (NORCO/VICODIN) 5-325 MG tablet    Sig: Take 1-2 daily    Dispense:  60 tablet    Refill:  0      Procedures: No procedures performed   Clinical Data: No additional findings.   Subjective: No chief complaint on file. Patient comes in today due to acute low back pain and left-sided sciatica. This occurred after working in the pool doing water aerobics. She feels that she may have pulled something but does feel like is more of a pinched nerve and sciatica. She reports pain in her backside on the left side it radiates down her leg toward her foot. She currently denies any change in bowel or bladder function. She denies any weakness  in her legs either. She is walking hunched over secondary to her back pain.  HPI  Review of Systems Currently denies any headache, short of breath, fever, chills, nausea, vomiting.  Objective: Vital Signs: There were no vitals taken for this visit.  Physical Exam She is alert and oriented 3 in no acute distress but obvious discomfort Ortho Exam On examination of her lumbar spine shows have pain sciatic region. She has pain in the left side with sciatic stretch. She is a positive straight leg raise left side. She has decreased sensation in the L4 and L5 distribution. She has good flexion extension of her back but is very painful to her. Specialty Comments:  No specialty comments available.  Imaging: Xr Hip Unilat W Or W/o Pelvis 1v Left  Result Date: 01/07/2017 Pelvis and lateral of her left hip shows a well-seated total hip arthroplasty with no, getting features.  Xr Lumbar Spine 2-3 Views  Result Date: 01/07/2017 An AP and lateral of the lumbar spine shows loss of her lumbar lordosis and mild degenerative disc disease at several levels.    PMFS History: Patient Active Problem List   Diagnosis Date Noted  . History of left  hip replacement 01/07/2017  . Lymphedema 12/06/2016  . Bilateral bunions 08/21/2016  . Seasonal allergies 07/06/2016  . Asthma 07/06/2016  . Sjogren's syndrome (HCC) 07/06/2016  . DDD (degenerative disc disease), lumbar 07/06/2016  . Bilateral primary osteoarthritis of knee 07/06/2016  . Osteoarthritis of both hands 07/06/2016  . Myofacial muscle pain 07/06/2016  . DDD lumbar spine with spinal stenosis 07/06/2016  . Abnormality of gait 06/26/2016  . Chronic pain of right ankle 06/26/2016  . Osteoarthritis of left hip 02/24/2016  . Status post left hip replacement 02/24/2016  . Varicose veins 04/20/2015  . Lipidemia 04/20/2015  . Rheumatoid arthritis (HCC) 11/20/2013  . Low back pain 11/20/2013  . HTN (hypertension) 11/20/2013  . GERD  (gastroesophageal reflux disease) 11/20/2013  . Bladder spasm 11/20/2013  . Hyperlipidemia 11/20/2013  . Status post right hip replacement 11/20/2013  . Bilateral swelling of feet   . Bruises easily    Past Medical History:  Diagnosis Date  . Anxiety   . Arthritis    left hip  . Asthma    Inhalers for tx  . Bilateral primary osteoarthritis of knee 07/06/2016   Moderate  . Bilateral swelling of feet    bilateral ankle and feet edema  . Bruises easily   . DDD (degenerative disc disease), lumbar 07/06/2016  . Hypertension   . Myofacial muscle pain 07/06/2016  . Osteoarthritis of both hands 07/06/2016  . Seasonal allergies 07/06/2016  . Sinus complaint   . Sjogren's syndrome (HCC)    dry eyes  . Tenonitis   . Urinary incontinence    occ. an issue wears pads    Family History  Problem Relation Age of Onset  . Arrhythmia Mother   . Stroke Mother   . Hyperlipidemia Mother   . Diabetes Mother   . Hypertension Mother   . Heart attack Father   . Diabetes Father   . Cancer Father   . Hypertension Father   . Hyperlipidemia Maternal Grandmother   . Hypertension Maternal Grandmother   . Heart attack Paternal Grandfather   . Stroke Paternal Grandfather   . Asthma Brother   . Emphysema Brother   . Hypertension Brother   . Diabetes Sister   . Hypertension Sister   . Hyperlipidemia Sister     Past Surgical History:  Procedure Laterality Date  . ABDOMINAL HYSTERECTOMY    . BACK SURGERY    . BREAST SURGERY Right    cyst drainage  . SHOULDER SURGERY Right    right shoulder tendon and rotator cuff repair  . TOTAL HIP ARTHROPLASTY Right 2006  . TOTAL HIP ARTHROPLASTY Left 02/24/2016   Procedure: LEFT TOTAL HIP ARTHROPLASTY ANTERIOR APPROACH;  Surgeon: Kathryne Hitch, MD;  Location: [facility];  Service: Orthopedics;  Laterality: Left;   Social History   Occupational History  . Not on file.   Social History Main Topics  . Smoking status: Former Smoker    Quit date:  11/21/1983  . Smokeless tobacco: Never Used  . Alcohol use No  . Drug use: No  . Sexual activity: Yes

## 2017-01-21 ENCOUNTER — Ambulatory Visit (INDEPENDENT_AMBULATORY_CARE_PROVIDER_SITE_OTHER): Payer: Medicare PPO | Admitting: Orthopaedic Surgery

## 2017-01-21 DIAGNOSIS — Z96642 Presence of left artificial hip joint: Secondary | ICD-10-CM | POA: Diagnosis not present

## 2017-01-21 DIAGNOSIS — M47816 Spondylosis without myelopathy or radiculopathy, lumbar region: Secondary | ICD-10-CM | POA: Diagnosis not present

## 2017-01-21 MED ORDER — METHOCARBAMOL 500 MG PO TABS: 500.0000 mg | ORAL_TABLET | Freq: Three times a day (TID) | ORAL | 2 refills | Status: DC | PRN

## 2017-01-21 NOTE — Progress Notes (Signed)
The patient is well-known to me. She is following up after upper steroid taper anti-inflammatories and a muscle relaxant to do with sciatic type of pain. She is also 11 months out from a left total hip arthroplasty left hip is doing well. We saw her last visit we ordered x-rays of her hip and her lumbar spine. She's of the steroid helped her significantly as well as shoewear. She is not interested in any other intervention at this moment.  I can put both hips through fluid range of motion this point was noted complicating features or pain. She has been improve mobility of her lumbar spine as well.  At this point she'll follow-up as needed. I did refill a muscle relaxant. We can always try another steroid taper down the road if needed. If she has any issues she'll let us know. All questions were encouraged and answered.

## 2017-01-22 DIAGNOSIS — M546 Pain in thoracic spine: Secondary | ICD-10-CM | POA: Diagnosis not present

## 2017-01-22 DIAGNOSIS — M542 Cervicalgia: Secondary | ICD-10-CM | POA: Diagnosis not present

## 2017-01-22 DIAGNOSIS — M9903 Segmental and somatic dysfunction of lumbar region: Secondary | ICD-10-CM | POA: Diagnosis not present

## 2017-01-22 DIAGNOSIS — M9902 Segmental and somatic dysfunction of thoracic region: Secondary | ICD-10-CM | POA: Diagnosis not present

## 2017-01-22 DIAGNOSIS — M9901 Segmental and somatic dysfunction of cervical region: Secondary | ICD-10-CM | POA: Diagnosis not present

## 2017-01-24 ENCOUNTER — Ambulatory Visit: Payer: Medicare PPO | Admitting: Rheumatology

## 2017-01-24 ENCOUNTER — Other Ambulatory Visit: Payer: Self-pay | Admitting: Internal Medicine

## 2017-01-24 DIAGNOSIS — Z1231 Encounter for screening mammogram for malignant neoplasm of breast: Secondary | ICD-10-CM

## 2017-01-30 ENCOUNTER — Other Ambulatory Visit: Payer: Self-pay | Admitting: Internal Medicine

## 2017-01-30 DIAGNOSIS — M542 Cervicalgia: Secondary | ICD-10-CM | POA: Diagnosis not present

## 2017-01-30 DIAGNOSIS — M9901 Segmental and somatic dysfunction of cervical region: Secondary | ICD-10-CM | POA: Diagnosis not present

## 2017-01-30 DIAGNOSIS — M9902 Segmental and somatic dysfunction of thoracic region: Secondary | ICD-10-CM | POA: Diagnosis not present

## 2017-01-30 DIAGNOSIS — M9903 Segmental and somatic dysfunction of lumbar region: Secondary | ICD-10-CM | POA: Diagnosis not present

## 2017-01-30 DIAGNOSIS — M546 Pain in thoracic spine: Secondary | ICD-10-CM | POA: Diagnosis not present

## 2017-02-04 DIAGNOSIS — M546 Pain in thoracic spine: Secondary | ICD-10-CM | POA: Diagnosis not present

## 2017-02-04 DIAGNOSIS — M542 Cervicalgia: Secondary | ICD-10-CM | POA: Diagnosis not present

## 2017-02-04 DIAGNOSIS — M9902 Segmental and somatic dysfunction of thoracic region: Secondary | ICD-10-CM | POA: Diagnosis not present

## 2017-02-04 DIAGNOSIS — M9901 Segmental and somatic dysfunction of cervical region: Secondary | ICD-10-CM | POA: Diagnosis not present

## 2017-02-04 DIAGNOSIS — M9903 Segmental and somatic dysfunction of lumbar region: Secondary | ICD-10-CM | POA: Diagnosis not present

## 2017-02-06 DIAGNOSIS — M9903 Segmental and somatic dysfunction of lumbar region: Secondary | ICD-10-CM | POA: Diagnosis not present

## 2017-02-06 DIAGNOSIS — M546 Pain in thoracic spine: Secondary | ICD-10-CM | POA: Diagnosis not present

## 2017-02-06 DIAGNOSIS — M9902 Segmental and somatic dysfunction of thoracic region: Secondary | ICD-10-CM | POA: Diagnosis not present

## 2017-02-06 DIAGNOSIS — M9901 Segmental and somatic dysfunction of cervical region: Secondary | ICD-10-CM | POA: Diagnosis not present

## 2017-02-06 DIAGNOSIS — M542 Cervicalgia: Secondary | ICD-10-CM | POA: Diagnosis not present

## 2017-02-07 NOTE — Progress Notes (Deleted)
Office Visit Note  Patient: Kathy Duran             Date of Birth: 04-25-1946           MRN: [id number]             PCP: Georgann Housekeeper, MD Referring: Georgann Housekeeper, MD Visit Date: 02/11/2017 Occupation: @GUAROCC @    Subjective:  No chief complaint on file.   History of Present Illness: Kathy Duran is a 71 y.o. female ***   Activities of Daily Living:  Patient reports morning stiffness for *** {minute/hour:19697}.   Patient {ACTIONS;DENIES/REPORTS:21021675::"Denies"} nocturnal pain.  Difficulty dressing/grooming: {ACTIONS;DENIES/REPORTS:21021675::"Denies"} Difficulty climbing stairs: {ACTIONS;DENIES/REPORTS:21021675::"Denies"} Difficulty getting out of chair: {ACTIONS;DENIES/REPORTS:21021675::"Denies"} Difficulty using hands for taps, buttons, cutlery, and/or writing: {ACTIONS;DENIES/REPORTS:21021675::"Denies"}   No Rheumatology ROS completed.   PMFS History:  Patient Active Problem List   Diagnosis Date Noted  . Lymphedema 12/06/2016  . Bilateral bunions 08/21/2016  . Seasonal allergies 07/06/2016  . Asthma 07/06/2016  . Sjogren's syndrome (HCC) 07/06/2016  . Bilateral primary osteoarthritis of knee 07/06/2016  . Osteoarthritis of both hands 07/06/2016  . Myofacial muscle pain 07/06/2016  . DDD lumbar spine with spinal stenosis 07/06/2016  . Abnormality of gait 06/26/2016  . Chronic pain of right ankle 06/26/2016  . Osteoarthritis of left hip 02/24/2016  . Status post left hip replacement 02/24/2016  . Varicose veins 04/20/2015  . Lipidemia 04/20/2015  . Rheumatoid factor positive 11/20/2013  . Low back pain 11/20/2013  . HTN (hypertension) 11/20/2013  . GERD (gastroesophageal reflux disease) 11/20/2013  . Bladder spasm 11/20/2013  . Hyperlipidemia 11/20/2013  . Status post right hip replacement 11/20/2013  . Bilateral swelling of feet   . Bruises easily     Past Medical History:  Diagnosis Date  . Anxiety   . Arthritis    left hip  . Asthma    Inhalers for tx  . Bilateral primary osteoarthritis of knee 07/06/2016   Moderate  . Bilateral swelling of feet    bilateral ankle and feet edema  . Bruises easily   . DDD (degenerative disc disease), lumbar 07/06/2016  . Hypertension   . Myofacial muscle pain 07/06/2016  . Osteoarthritis of both hands 07/06/2016  . Seasonal allergies 07/06/2016  . Sinus complaint   . Sjogren's syndrome (HCC)    dry eyes  . Tenonitis   . Urinary incontinence    occ. an issue wears pads    Family History  Problem Relation Age of Onset  . Arrhythmia Mother   . Stroke Mother   . Hyperlipidemia Mother   . Diabetes Mother   . Hypertension Mother   . Heart attack Father   . Diabetes Father   . Cancer Father   . Hypertension Father   . Hyperlipidemia Maternal Grandmother   . Hypertension Maternal Grandmother   . Heart attack Paternal Grandfather   . Stroke Paternal Grandfather   . Asthma Brother   . Emphysema Brother   . Hypertension Brother   . Diabetes Sister   . Hypertension Sister   . Hyperlipidemia Sister    Past Surgical History:  Procedure Laterality Date  . ABDOMINAL HYSTERECTOMY    . BACK SURGERY    . BREAST SURGERY Right    cyst drainage  . SHOULDER SURGERY Right    right shoulder tendon and rotator cuff repair  . TOTAL HIP ARTHROPLASTY Right 2006  . TOTAL HIP ARTHROPLASTY Left 02/24/2016   Procedure: LEFT TOTAL HIP ARTHROPLASTY ANTERIOR APPROACH;  Surgeon:  Kathryne Hitch, MD;  Location: [facility];  Service: Orthopedics;  Laterality: Left;   Social History   Social History Narrative  . No narrative on file     Objective: Vital Signs: There were no vitals taken for this visit.   Physical Exam   Musculoskeletal Exam: ***  CDAI Exam: No CDAI exam completed.    Investigation: No additional findings.  CBC Latest Ref Rng & Units 09/18/2016 02/25/2016 02/17/2016  WBC 3.8 - 10.8 K/uL 4.9 6.9 4.1  Hemoglobin 11.7 - 15.5 g/dL 13.2 10.7(L) 13.6  Hematocrit 35.0 -  45.0 % 40.7 31.8(L) 40.9  Platelets 140 - 400 K/uL 311 257 303   CMP Latest Ref Rng & Units 09/18/2016 02/25/2016 02/17/2016  Glucose 65 - 99 mg/dL 82 440(N) 80  BUN 7 - 25 mg/dL 15 7 12   Creatinine 0.50 - 0.99 mg/dL 0.27 2.53  Sodium 135 - 146 mmol/L 141 137 136  Potassium 3.5 - 5.3 mmol/L 4.3 3.7 4.1  Chloride 98 - 110 mmol/L 104 105 103  CO2 20 - 31 mmol/L 22 25 25   Calcium 8.6 - 10.4 mg/dL 6.64 ) 9.2  Total Protein 6.1 - 8.1 g/dL 7.0 - -  Total Bilirubin 0.2 - 1.2 mg/dL 0.4 - -  Alkaline Phos 33 - 130 U/L 99 - -  AST 10 - 35 U/L 22 - -  ALT 6 - 29 U/L 23 - -   Imaging: No results found.  Speciality Comments: No specialty comments available.    Procedures:  No procedures performed Allergies: Arthrotec [diclofenac-misoprostol]; Augmentin [amoxicillin-pot clavulanate]; Latex; and Naproxen   Assessment / Plan:     Visit Diagnoses: Sjogren's syndrome with keratoconjunctivitis sicca (HCC) Positive ANA, La and rheumatoid factor  Rheumatoid factor positive  Fibromyalgia  Other fatigue  Primary insomnia  Lymphedema  Primary osteoarthritis of both hands  Status post left hip replacement  Status post right hip replacement  Bilateral primary osteoarthritis of knee  DDD lumbar spine with spinal stenosis    Orders: No orders of the defined types were placed in this encounter.  No orders of the defined types were placed in this encounter.   Face-to-face time spent with patient was *** minutes. 50% of time was spent in counseling and coordination of care.  Follow-Up Instructions: No Follow-up on file.   Zian Delair, RT  Note - This record has been created using 40.3.  Chart creation errors have been sought, but may not always  have been located. Such creation errors do not reflect on  the standard of medical care.

## 2017-02-11 ENCOUNTER — Ambulatory Visit: Payer: Medicare PPO | Admitting: Rheumatology

## 2017-02-11 DIAGNOSIS — M9901 Segmental and somatic dysfunction of cervical region: Secondary | ICD-10-CM | POA: Diagnosis not present

## 2017-02-11 DIAGNOSIS — M9902 Segmental and somatic dysfunction of thoracic region: Secondary | ICD-10-CM | POA: Diagnosis not present

## 2017-02-11 DIAGNOSIS — M542 Cervicalgia: Secondary | ICD-10-CM | POA: Diagnosis not present

## 2017-02-11 DIAGNOSIS — M546 Pain in thoracic spine: Secondary | ICD-10-CM | POA: Diagnosis not present

## 2017-02-11 DIAGNOSIS — M9903 Segmental and somatic dysfunction of lumbar region: Secondary | ICD-10-CM | POA: Diagnosis not present

## 2017-02-13 DIAGNOSIS — M542 Cervicalgia: Secondary | ICD-10-CM | POA: Diagnosis not present

## 2017-02-13 DIAGNOSIS — M9903 Segmental and somatic dysfunction of lumbar region: Secondary | ICD-10-CM | POA: Diagnosis not present

## 2017-02-13 DIAGNOSIS — M9901 Segmental and somatic dysfunction of cervical region: Secondary | ICD-10-CM | POA: Diagnosis not present

## 2017-02-13 DIAGNOSIS — M546 Pain in thoracic spine: Secondary | ICD-10-CM | POA: Diagnosis not present

## 2017-02-13 DIAGNOSIS — M9902 Segmental and somatic dysfunction of thoracic region: Secondary | ICD-10-CM | POA: Diagnosis not present

## 2017-02-18 ENCOUNTER — Ambulatory Visit
Admission: RE | Admit: 2017-02-18 | Discharge: 2017-02-18 | Disposition: A | Discharge status: Home / Self Care | Payer: Medicare PPO | Source: Ambulatory Visit | Attending: Internal Medicine | Admitting: Internal Medicine

## 2017-02-18 DIAGNOSIS — Z1231 Encounter for screening mammogram for malignant neoplasm of breast: Secondary | ICD-10-CM

## 2017-02-26 ENCOUNTER — Telehealth (INDEPENDENT_AMBULATORY_CARE_PROVIDER_SITE_OTHER): Payer: Self-pay | Admitting: Orthopaedic Surgery

## 2017-02-26 NOTE — Telephone Encounter (Signed)
Patient aware handicap here

## 2017-02-26 NOTE — Telephone Encounter (Signed)
Patient came in stating that her handicap plaque will expire at the end of this month and requested a new one.  Please call pt when it is ready.  Thank you.  CB#[phone].

## 2017-02-26 NOTE — Telephone Encounter (Signed)
3 months HC

## 2017-02-26 NOTE — Telephone Encounter (Signed)
Please advise if ok ?

## 2017-03-04 ENCOUNTER — Telehealth (INDEPENDENT_AMBULATORY_CARE_PROVIDER_SITE_OTHER): Payer: Self-pay

## 2017-03-04 NOTE — Telephone Encounter (Signed)
Called into [facility] pharmacy

## 2017-03-04 NOTE — Telephone Encounter (Signed)
Patient would like a Rx for pain.  Cb# is [phone].  Please Advise.  Thank you.

## 2017-03-04 NOTE — Telephone Encounter (Signed)
PLEASE ADVISE.

## 2017-03-04 NOTE — Telephone Encounter (Signed)
Only tramadol or tylenol #3 -1-2 every 8-12 hours as needed for pain #60 no refills.

## 2017-03-05 ENCOUNTER — Other Ambulatory Visit (INDEPENDENT_AMBULATORY_CARE_PROVIDER_SITE_OTHER): Payer: Self-pay | Admitting: Orthopaedic Surgery

## 2017-03-05 ENCOUNTER — Telehealth (INDEPENDENT_AMBULATORY_CARE_PROVIDER_SITE_OTHER): Payer: Self-pay

## 2017-03-05 NOTE — Telephone Encounter (Signed)
Patient would like to know if she can get an injection, stated that she is in a lot of pain.  CB# is [phone].  Please advise.  Thank you.

## 2017-03-05 NOTE — Telephone Encounter (Signed)
Please advise She wants to get a hip injection

## 2017-03-05 NOTE — Telephone Encounter (Signed)
Please advise 

## 2017-03-06 NOTE — Telephone Encounter (Signed)
Talked with patient and patient has appointment for Thursday 03/07/17.

## 2017-03-06 NOTE — Telephone Encounter (Signed)
She said hip

## 2017-03-06 NOTE — Telephone Encounter (Signed)
I then need to see her in the office.

## 2017-03-06 NOTE — Telephone Encounter (Signed)
What type of injection is she referring to? I believe the last time I saw her that it was treatment for sciatica. It may be that we need to set her up for an epidural steroid injection. Please check with her.

## 2017-03-06 NOTE — Telephone Encounter (Signed)
Can you do me a favor and make her an appt with Magnus Ivan or Bronson Curb; he said she needs appt so we can decide what kind of shot she needs

## 2017-03-07 ENCOUNTER — Ambulatory Visit (INDEPENDENT_AMBULATORY_CARE_PROVIDER_SITE_OTHER): Payer: Medicare PPO | Admitting: Orthopaedic Surgery

## 2017-03-07 DIAGNOSIS — M7062 Trochanteric bursitis, left hip: Secondary | ICD-10-CM | POA: Diagnosis not present

## 2017-03-07 MED ORDER — GABAPENTIN 300 MG PO CAPS: 300.0000 mg | ORAL_CAPSULE | Freq: Every day | ORAL | 0 refills | Status: DC

## 2017-03-07 MED ORDER — METHYLPREDNISOLONE ACETATE 40 MG/ML IJ SUSP
40.0000 mg | INTRAMUSCULAR | Status: AC | PRN
  Administered 2017-03-07: 40 mg via INTRA_ARTICULAR

## 2017-03-07 MED ORDER — LIDOCAINE HCL 1 % IJ SOLN
3.0000 mL | INTRAMUSCULAR | Status: AC | PRN
  Administered 2017-03-07: 3 mL

## 2017-03-07 MED ORDER — HYDROCODONE-ACETAMINOPHEN 5-325 MG PO TABS: ORAL_TABLET | ORAL | 0 refills | Status: DC

## 2017-03-07 MED ORDER — CYCLOBENZAPRINE HCL 10 MG PO TABS: 10.0000 mg | ORAL_TABLET | Freq: Three times a day (TID) | ORAL | 0 refills | Status: DC | PRN

## 2017-03-07 NOTE — Progress Notes (Signed)
Office Visit Note   Patient: Kathy Duran           Date of Birth: [birth year]           MRN: [id number] Visit Date: 03/07/2017              Requested by: Georgann Housekeeper, MD [address] [place], [place] [address] PCP: Georgann Housekeeper, MD   Assessment & Plan: Visit Diagnoses:  1. Trochanteric bursitis, left hip     Plan: I definitely feel that she would benefit from a steroid injection trochanteric area and continuing her steroid taper. I will try some Neurontin for nerve type of pain as well as Flexeril and hydrocodone. It is essential we set her up for outpatient physical therapy to see if they can try other modalities to help with stretching and get her pain to calm down and even considering a TENS unit. I'll see her back myself in about 2 weeks to see how she is doing overall.  Follow-Up Instructions: Return in about 2 weeks (around 03/21/2017).   Orders:  No orders of the defined types were placed in this encounter.  No orders of the defined types were placed in this encounter.     Procedures: Large Joint Inj Date/Time: 03/07/2017 3:47 PM Performed by: Kathryne Hitch Authorized by: Kathryne Hitch   Location:  Hip Site:  L greater trochanter Ultrasound Guidance: No   Fluoroscopic Guidance: No   Arthrogram: No   Medications:  3 mL lidocaine 1 %; 40 mg methylPREDNISolone acetate 40 MG/ML     Clinical Data: No additional findings.   Subjective: No chief complaint on file. The patient comes in still with left hip pain that is quite severe. She is 12 months out from a total hip arthroplasty and that is been doing well and she has no pain in the groin. She points the trochanteric area down the IT band source of her pain is becoming quite severe. I recently started her on a six-day steroid taper but she joint 3 days into this.  On exam she has significant tenderness of the trochanteric area and IT band and this is causing debilitating  pain and affecting her mobility.  HPI  Review of Systems Has any fever, chills, nausea, vomiting, chest pain, shortness of breath  Objective: Vital Signs: There were no vitals taken for this visit.  Physical Exam She is alert and oriented 3 and in no acute distress but obvious discomfort Ortho Exam Examination of her left hip shows fluid range of motion. She is significantly tender over the trochanteric area and IT band. Her incisions well-healed. Specialty Comments:  No specialty comments available.  Imaging: No results found.   PMFS History: Patient Active Problem List   Diagnosis Date Noted  . Trochanteric bursitis, left hip 03/07/2017  . Lymphedema 12/06/2016  . Bilateral bunions 08/21/2016  . Seasonal allergies 07/06/2016  . Asthma 07/06/2016  . Sjogren's syndrome (HCC) 07/06/2016  . Bilateral primary osteoarthritis of knee 07/06/2016  . Osteoarthritis of both hands 07/06/2016  . Myofacial muscle pain 07/06/2016  . DDD lumbar spine with spinal stenosis 07/06/2016  . Abnormality of gait 06/26/2016  . Chronic pain of right ankle 06/26/2016  . H/O bilateral hip replacements 02/24/2016  . Varicose veins 04/20/2015  . Lipidemia 04/20/2015  . Rheumatoid factor positive 11/20/2013  . Low back pain 11/20/2013  . HTN (hypertension) 11/20/2013  . GERD (gastroesophageal reflux disease) 11/20/2013  . Bladder spasm 11/20/2013  .  Hyperlipidemia 11/20/2013  . Bilateral swelling of feet   . Bruises easily    Past Medical History:  Diagnosis Date  . Anxiety   . Arthritis    left hip  . Asthma    Inhalers for tx  . Bilateral primary osteoarthritis of knee 07/06/2016   Moderate  . Bilateral swelling of feet    bilateral ankle and feet edema  . Bruises easily   . DDD (degenerative disc disease), lumbar 07/06/2016  . Hypertension   . Myofacial muscle pain 07/06/2016  . Osteoarthritis of both hands 07/06/2016  . Seasonal allergies 07/06/2016  . Sinus complaint   .  Sjogren's syndrome (HCC)    dry eyes  . Tenonitis   . Urinary incontinence    occ. an issue wears pads    Family History  Problem Relation Age of Onset  . Arrhythmia Mother   . Stroke Mother   . Hyperlipidemia Mother   . Diabetes Mother   . Hypertension Mother   . Heart attack Father   . Diabetes Father   . Cancer Father   . Hypertension Father   . Hyperlipidemia Maternal Grandmother   . Hypertension Maternal Grandmother   . Heart attack Paternal Grandfather   . Stroke Paternal Grandfather   . Asthma Brother   . Emphysema Brother   . Hypertension Brother   . Diabetes Sister   . Hypertension Sister   . Hyperlipidemia Sister     Past Surgical History:  Procedure Laterality Date  . ABDOMINAL HYSTERECTOMY    . BACK SURGERY    . BREAST CYST ASPIRATION    . BREAST SURGERY Right    cyst drainage  . SHOULDER SURGERY Right    right shoulder tendon and rotator cuff repair  . TOTAL HIP ARTHROPLASTY Right 2006  . TOTAL HIP ARTHROPLASTY Left 02/24/2016   Procedure: LEFT TOTAL HIP ARTHROPLASTY ANTERIOR APPROACH;  Surgeon: Kathryne Hitch, MD;  Location: [facility];  Service: Orthopedics;  Laterality: Left;   Social History   Occupational History  . Not on file.   Social History Main Topics  . Smoking status: Former Smoker    Quit date: 11/21/1983  . Smokeless tobacco: Never Used  . Alcohol use No  . Drug use: No  . Sexual activity: Yes

## 2017-03-13 ENCOUNTER — Telehealth (INDEPENDENT_AMBULATORY_CARE_PROVIDER_SITE_OTHER): Payer: Self-pay

## 2017-03-13 NOTE — Telephone Encounter (Signed)
Patient was calling concerning outpatient Physical therapy.  Please advise.  Thank You.   CB# is [phone].

## 2017-03-15 ENCOUNTER — Other Ambulatory Visit (INDEPENDENT_AMBULATORY_CARE_PROVIDER_SITE_OTHER): Payer: Self-pay

## 2017-03-15 DIAGNOSIS — Z96642 Presence of left artificial hip joint: Secondary | ICD-10-CM

## 2017-03-15 NOTE — Telephone Encounter (Signed)
Patient aware she waits for PT to call her and schedule

## 2017-03-19 DIAGNOSIS — I10 Essential (primary) hypertension: Secondary | ICD-10-CM | POA: Diagnosis not present

## 2017-03-19 DIAGNOSIS — J309 Allergic rhinitis, unspecified: Secondary | ICD-10-CM | POA: Diagnosis not present

## 2017-03-19 DIAGNOSIS — M25552 Pain in left hip: Secondary | ICD-10-CM | POA: Diagnosis not present

## 2017-03-19 DIAGNOSIS — J45909 Unspecified asthma, uncomplicated: Secondary | ICD-10-CM | POA: Diagnosis not present

## 2017-03-19 DIAGNOSIS — K635 Polyp of colon: Secondary | ICD-10-CM | POA: Diagnosis not present

## 2017-03-19 DIAGNOSIS — M3500 Sicca syndrome, unspecified: Secondary | ICD-10-CM | POA: Diagnosis not present

## 2017-03-19 DIAGNOSIS — E782 Mixed hyperlipidemia: Secondary | ICD-10-CM | POA: Diagnosis not present

## 2017-03-21 ENCOUNTER — Ambulatory Visit (INDEPENDENT_AMBULATORY_CARE_PROVIDER_SITE_OTHER): Payer: Medicare PPO | Admitting: Physician Assistant

## 2017-03-21 ENCOUNTER — Encounter (INDEPENDENT_AMBULATORY_CARE_PROVIDER_SITE_OTHER): Payer: Self-pay | Admitting: Physician Assistant

## 2017-03-21 DIAGNOSIS — M5432 Sciatica, left side: Secondary | ICD-10-CM | POA: Diagnosis not present

## 2017-03-21 DIAGNOSIS — M7062 Trochanteric bursitis, left hip: Secondary | ICD-10-CM | POA: Diagnosis not present

## 2017-03-21 MED ORDER — HYDROCODONE-ACETAMINOPHEN 5-325 MG PO TABS: ORAL_TABLET | ORAL | 0 refills | Status: DC

## 2017-03-21 NOTE — Progress Notes (Signed)
Office Visit Note   Patient: Kathy Duran           Date of Birth: Aug 28, 1945           MRN: [id number] Visit Date: 03/21/2017              Requested by: Georgann Housekeeper, MD [address] [place], [place] [address] PCP: Georgann Housekeeper, MD   Assessment & Plan: Visit Diagnoses:  1. Trochanteric bursitis, left hip   2. Back pain with left-sided sciatica     Plan:  Due to patient's continued pain particularly the radicular pain down the left leg recommended MRI to rule out HNP. She'll follow with [place] after the MRI to go over the results and discuss further treatment. He'll continue to do stretching exercises for the trochanteric bursitis.  Follow-Up Instructions: Return for  after MRI.   Orders:  No orders of the defined types were placed in this encounter.  Meds ordered this encounter  Medications  . HYDROcodone-acetaminophen (NORCO/VICODIN) 5-325 MG tablet    Sig: Take 1-2 daily    Dispense:  40 tablet    Refill:  0      Procedures: No procedures performed   Clinical Data: No additional findings.   Subjective: Chief Complaint  Patient presents with  . Left Hip - Pain    HPI As his head he returns today follow-up of her left hip trochanteric bursitis. She states the injection helped. However she is evident numbness down from the hip to her left thigh. His also having some low back pain. Pain is awaking her at night. She does have a history of back surgery back in 1985 or 1986 she is unsure exactly what was done at that time. She is having no bowel bladder dysfunction. States her pain is worse in the left leg at night. She is scheduled for physical therapy. Aggressive lumbar spine in June showed loss of the lumbar lordosis. And mild degenerative disc disease at multiple levels.  Review of Systems Denies any fevers, shortness of breath, chest pain, bowel or bladder incontinence. Otherwise please see history of present illness  Objective: Vital Signs:  There were no vitals taken for this visit.  Physical Exam  Constitutional: She is oriented to person, place, and time. She appears well-developed and well-nourished. No distress.  Cardiovascular: Intact distal pulses.   Pulmonary/Chest: Effort normal.  Neurological: She is alert and oriented to person, place, and time.  Skin: She is not diaphoretic.  Psychiatric: She has a normal mood and affect.    Ortho Exam  Bilateral lower extremities 5 out of 5 strengths.Positive straight leg raise on the left negative on the right. Tenderness with palpation left lower lumbar paraspinous region. Tenderness over the left trochanteric region. Decreased sensation light touch over the lateral left thigh. Calves SUPPLE nontender.  Specialty Comments:  No specialty comments available.  Imaging: No results found.   PMFS History: Patient Active Problem List   Diagnosis Date Noted  . Trochanteric bursitis, left hip 03/07/2017  . Lymphedema 12/06/2016  . Bilateral bunions 08/21/2016  . Seasonal allergies 07/06/2016  . Asthma 07/06/2016  . Sjogren's syndrome (HCC) 07/06/2016  . Bilateral primary osteoarthritis of knee 07/06/2016  . Osteoarthritis of both hands 07/06/2016  . Myofacial muscle pain 07/06/2016  . DDD lumbar spine with spinal stenosis 07/06/2016  . Abnormality of gait 06/26/2016  . Chronic pain of right ankle 06/26/2016  . H/O bilateral hip replacements 02/24/2016  . Varicose veins 04/20/2015  .  Lipidemia 04/20/2015  . Rheumatoid factor positive 11/20/2013  . Back pain with left-sided sciatica 11/20/2013  . HTN (hypertension) 11/20/2013  . GERD (gastroesophageal reflux disease) 11/20/2013  . Bladder spasm 11/20/2013  . Hyperlipidemia 11/20/2013  . Bilateral swelling of feet   . Bruises easily    Past Medical History:  Diagnosis Date  . Anxiety   . Arthritis    left hip  . Asthma    Inhalers for tx  . Bilateral primary osteoarthritis of knee 07/06/2016   Moderate  .  Bilateral swelling of feet    bilateral ankle and feet edema  . Bruises easily   . DDD (degenerative disc disease), lumbar 07/06/2016  . Hypertension   . Myofacial muscle pain 07/06/2016  . Osteoarthritis of both hands 07/06/2016  . Seasonal allergies 07/06/2016  . Sinus complaint   . Sjogren's syndrome (HCC)    dry eyes  . Tenonitis   . Urinary incontinence    occ. an issue wears pads    Family History  Problem Relation Age of Onset  . Arrhythmia Mother   . Stroke Mother   . Hyperlipidemia Mother   . Diabetes Mother   . Hypertension Mother   . Heart attack Father   . Diabetes Father   . Cancer Father   . Hypertension Father   . Hyperlipidemia Maternal Grandmother   . Hypertension Maternal Grandmother   . Heart attack Paternal Grandfather   . Stroke Paternal Grandfather   . Asthma Brother   . Emphysema Brother   . Hypertension Brother   . Diabetes Sister   . Hypertension Sister   . Hyperlipidemia Sister     Past Surgical History:  Procedure Laterality Date  . ABDOMINAL HYSTERECTOMY    . BACK SURGERY    . BREAST CYST ASPIRATION    . BREAST SURGERY Right    cyst drainage  . SHOULDER SURGERY Right    right shoulder tendon and rotator cuff repair  . TOTAL HIP ARTHROPLASTY Right 2006  . TOTAL HIP ARTHROPLASTY Left 02/24/2016   Procedure: LEFT TOTAL HIP ARTHROPLASTY ANTERIOR APPROACH;  Surgeon: Kathryne Hitch, MD;  Location: [facility];  Service: Orthopedics;  Laterality: Left;   Social History   Occupational History  . Not on file.   Social History Main Topics  . Smoking status: Former Smoker    Quit date: 11/21/1983  . Smokeless tobacco: Never Used  . Alcohol use No  . Drug use: No  . Sexual activity: Yes

## 2017-03-21 NOTE — Progress Notes (Deleted)
Last

## 2017-03-21 NOTE — Addendum Note (Signed)
Addended by: Albertina Parr on: 03/21/2017 04:24 PM   Modules accepted: Orders

## 2017-03-26 ENCOUNTER — Other Ambulatory Visit (INDEPENDENT_AMBULATORY_CARE_PROVIDER_SITE_OTHER): Payer: Self-pay

## 2017-03-26 ENCOUNTER — Telehealth (INDEPENDENT_AMBULATORY_CARE_PROVIDER_SITE_OTHER): Payer: Self-pay | Admitting: Radiology

## 2017-03-26 MED ORDER — HYDROCODONE-ACETAMINOPHEN 5-325 MG PO TABS: 1.0000 | ORAL_TABLET | Freq: Two times a day (BID) | ORAL | 0 refills | Status: DC | PRN

## 2017-03-26 NOTE — Telephone Encounter (Signed)
Ok to refill for 1-2 twice daily then and tell her to not take 3 at a time. #40, no refills

## 2017-03-26 NOTE — Telephone Encounter (Signed)
Patient is calling states that her pharm told that she was only suppose 2 of her pain pills a day and she has been taking 3 qd.  And now is out of her pain meds.  Please call her back to advise.

## 2017-03-26 NOTE — Telephone Encounter (Signed)
Patient aware Rx ready at front desk  

## 2017-03-26 NOTE — Telephone Encounter (Signed)
Please advise 

## 2017-03-27 ENCOUNTER — Ambulatory Visit: Payer: Medicare PPO | Attending: Internal Medicine | Admitting: Physical Therapy

## 2017-03-27 ENCOUNTER — Encounter: Payer: Self-pay | Admitting: Physical Therapy

## 2017-03-27 DIAGNOSIS — M25552 Pain in left hip: Secondary | ICD-10-CM | POA: Diagnosis not present

## 2017-03-27 DIAGNOSIS — M6281 Muscle weakness (generalized): Secondary | ICD-10-CM | POA: Diagnosis not present

## 2017-03-27 DIAGNOSIS — M25652 Stiffness of left hip, not elsewhere classified: Secondary | ICD-10-CM | POA: Diagnosis not present

## 2017-03-27 DIAGNOSIS — R262 Difficulty in walking, not elsewhere classified: Secondary | ICD-10-CM | POA: Insufficient documentation

## 2017-03-27 DIAGNOSIS — M25671 Stiffness of right ankle, not elsewhere classified: Secondary | ICD-10-CM | POA: Diagnosis not present

## 2017-03-27 DIAGNOSIS — R279 Unspecified lack of coordination: Secondary | ICD-10-CM | POA: Insufficient documentation

## 2017-03-27 DIAGNOSIS — R600 Localized edema: Secondary | ICD-10-CM | POA: Insufficient documentation

## 2017-03-27 DIAGNOSIS — R2689 Other abnormalities of gait and mobility: Secondary | ICD-10-CM | POA: Insufficient documentation

## 2017-03-27 DIAGNOSIS — M62838 Other muscle spasm: Secondary | ICD-10-CM | POA: Diagnosis not present

## 2017-03-27 DIAGNOSIS — M25571 Pain in right ankle and joints of right foot: Secondary | ICD-10-CM | POA: Diagnosis not present

## 2017-03-27 NOTE — Therapy (Signed)
[facility] [address] [place], [place], [address] Phone: [phone]   Fax:  [phone]  Physical Therapy Evaluation  Patient Details  Name: Kathy Duran MRN: [id number] Date of Birth: 07/30/1945 Referring Provider: Allie Bossier Md  Encounter Date: 03/27/2017      PT End of Session - 03/27/17 0914    Visit Number 1   Number of Visits 13   Date for PT Re-Evaluation 05/08/17   Authorization Type MCR: Kx mod by 15th visit, Progress note by 10th visiti   PT Start Time 0808  pt arrived 8 min late   PT Stop Time 0848   PT Time Calculation (min) 40 min   Activity Tolerance Patient tolerated treatment well;Patient limited by pain   Behavior During Therapy [facility] for tasks assessed/performed      Past Medical History:  Diagnosis Date  . Anxiety   . Arthritis    left hip  . Asthma    Inhalers for tx  . Bilateral primary osteoarthritis of knee 07/06/2016   Moderate  . Bilateral swelling of feet    bilateral ankle and feet edema  . Bruises easily   . DDD (degenerative disc disease), lumbar 07/06/2016  . Hypertension   . Myofacial muscle pain 07/06/2016  . Osteoarthritis of both hands 07/06/2016  . Seasonal allergies 07/06/2016  . Sinus complaint   . Sjogren's syndrome (HCC)    dry eyes  . Tenonitis   . Urinary incontinence    occ. an issue wears pads    Past Surgical History:  Procedure Laterality Date  . ABDOMINAL HYSTERECTOMY    . BACK SURGERY    . BREAST CYST ASPIRATION    . BREAST SURGERY Right    cyst drainage  . CARPAL TUNNEL RELEASE Right 2001  . SHOULDER SURGERY Right    right shoulder tendon and rotator cuff repair  . TOTAL HIP ARTHROPLASTY Right 2006  . TOTAL HIP ARTHROPLASTY Left 02/24/2016   Procedure: LEFT TOTAL HIP ARTHROPLASTY ANTERIOR APPROACH;  Surgeon: Kathryne Hitch, MD;  Location: [facility];  Service: Orthopedics;  Laterality: Left;    There were no vitals filed for this visit.       Subjective Assessment - 03/27/17 0813    Subjective pt  is a 71 y.o F with CC of L hip / low back pain that started about 3 weeks ago with no specific MOI but reports she has been doing water aerobics and noted another occurrence of pinching in the low back.  she has hx of bil THA with the L THA being 02/24/2016. she reports she feels it is a combinaion of the hip and low back. she reports she has numbness from the hip down to the back of the knee. Since onset the pain seems to be the same but can flucutate depending on activity.    Pertinent History hx or bil THA with R THA 2006, and L THA 2017   Limitations Sitting;Lifting;Standing;Walking;House hold activities   How long can you sit comfortably? 30-45 min   How long can you stand comfortably? 30-45 min   How long can you walk comfortably? 30-45 min   Diagnostic tests scheduled for MRI   Patient Stated Goals to get out of pain, to know whats going on, get back to normal activity, improve balance/ walking better   Currently in Pain? Yes   Pain Score 5    Pain Location Hip   Pain Orientation Left   Pain Descriptors /  Indicators Sharp;Aching;Dull;Throbbing   Pain Type Chronic pain   Pain Radiating Towards to the back of the L knee   Pain Onset 1 to 4 weeks ago   Pain Frequency Constant   Aggravating Factors  prolonged walking/ standing, prolonged sitting,    Pain Relieving Factors laying down, heat / ice,    Effect of Pain on Daily Activities limited endurance with mulitple positions.             [facility] PT Assessment - 03/27/17 0825      Assessment   Medical Diagnosis S/p L THA   Referring Provider Allie Bossier Md   Onset Date/Surgical Date --  3 weeks   Hand Dominance Right   Next MD Visit --  PRN following MRI   Prior Therapy yes  for L THA and RTHA     Precautions   Precautions None     Restrictions   Weight Bearing Restrictions No     Balance Screen   Has the patient fallen in the past 6 months No   Has the  patient had a decrease in activity level because of a fear of falling?  No   Is the patient reluctant to leave their home because of a fear of falling?  No     Home Nurse, mental health Private residence   Living Arrangements Spouse/significant other   Available Help at Discharge Family   Type of Home House   Home Access Stairs to enter   Entrance Stairs-Number of Steps 2   Entrance Stairs-Rails Right   Home Layout One level     Prior Function   Level of Independence Independent;Needs assistance with homemaking   Vacuuming Moderate   Light Housekeeping Minimal   Vocation Retired   Leisure get out and walk, [employer], reading books, being active with church     Cognition   Overall Cognitive Status Within Functional Limits for tasks assessed     Posture/Postural Control   Posture/Postural Control Postural limitations   Postural Limitations Rounded Shoulders;Forward head;Decreased lumbar lordosis     ROM / Strength   AROM / PROM / Strength AROM;PROM     AROM   AROM Assessment Site Lumbar;Hip   Right/Left Hip Left;Right   Right Hip Flexion 90   Right Hip External Rotation  12   Right Hip Internal Rotation  24   Left Hip Extension --  to neutral   Left Hip Flexion 54   Left Hip External Rotation  5   Left Hip Internal Rotation  20   Lumbar Flexion 52   Lumbar Extension 14   Lumbar - Right Side Bend 12   Lumbar - Left Side Bend 18  ERP     PROM   PROM Assessment Site Hip   Right/Left Hip Left   Left Hip Flexion 80  cramping at end range in glute, with referrall of LLE sympto     Strength   Strength Assessment Site Lumbar;Hip   Right/Left Hip Right;Left   Right Hip Flexion 4/5   Right Hip Extension 4-/5   Right Hip External Rotation  4-/5   Right Hip Internal Rotation 4-/5   Right Hip ABduction 4-/5   Right Hip ADduction 4-/5   Left Hip Flexion 3+/5  pain during testing   Left Hip Extension 3+/5   Left Hip External Rotation 4-/5   Left Hip  Internal Rotation 4-/5   Left Hip ABduction 4-/5   Left Hip ADduction 4-/5  Palpation   Palpation comment TTP in L piriformis and glute med/ min region. mild tightness in bil lumbar paraspinals     Special Tests    Special Tests Lumbar   Lumbar Tests Straight Leg Raise;Slump Test     Slump test   Findings Positive   Side Left     Straight Leg Raise   Findings Positive   Side  Left   Comment ipsilateral referral only, no contralateral referral      Ambulation/Gait   Ambulation/Gait Yes   Gait Pattern Step-through pattern;Decreased stride length;Trendelenburg;Antalgic;Lateral hip instability;Decreased trunk rotation            Objective measurements completed on examination: See above findings.          [facility] Adult PT Treatment/Exercise - 03/27/17 0825      Lumbar Exercises: Stretches   Piriformis Stretch 2 reps;30 seconds                PT Education - 03/27/17 0913    Education provided Yes   Education Details evaluation findings, POC, goals, HEP with form/ rationale, anatomy of the hip and effects of surrounding muscles.   Person(s) Educated Patient   Methods Explanation;Verbal cues;Handout   Comprehension Verbalized understanding;Verbal cues required          PT Short Term Goals - 03/27/17 0938      PT SHORT TERM GOAL #1   Title pt to be I with initial HEP    Time 3   Period Weeks   Status New   Target Date 04/17/17     PT SHORT TERM GOAL #2   Title pt to verbalize / demo proper posture and lifting mechanics to prevent / reduce hip/ low back pain   Time 3   Period Weeks   Status New   Target Date 04/17/17     PT SHORT TERM GOAL #3   Title pt will report decreased LLE referral symptoms as intermittent to demonstrate improvement of condition   Time 3   Period Weeks   Status New   Target Date 04/17/17           PT Long Term Goals - 03/27/17 0940      PT LONG TERM GOAL #1   Title pt to increase L hip flexion to >/= 90  degrees and extension and IR/ER by >/= 8 degrees with </= 2/10 pain for functional mobility required for ADLs and proper gait   Time 6   Period Weeks   Status New   Target Date 05/08/17     PT LONG TERM GOAL #2   Title increase LLE strength to >/= 4/5 to promote hip / knee stability with walking / standing activities    Time 6   Period Weeks   Status New   Target Date 05/08/17     PT LONG TERM GOAL #3   Title report no referral in the LLE for >/= 1 week and pain in the hip to </= 1/10 for improvement in condition    Time 6   Period Weeks   Status New   Target Date 05/08/17     PT LONG TERM GOAL #4   Title pt to be able to sit/ stand and walk for >/= 60 min with </= 1/10 pain for community ambulation / ADLS and pt's personal goals of returning to exercise    Time 6   Period Weeks   Status New   Target Date 05/08/17  PT LONG TERM GOAL #5   Title increase FOTO to </= 46% limited to demo improvement in condition   Time 6   Period Weeks   Status New   Target Date 05/08/17                Plan - Apr 12, 2017 0920    Clinical Impression Statement pt presents to [facility] with CC of L posterior hip pain with referral down to the L knee. she has hx of bil THA with L being most recent in 02/2016. She demonstrates significant limitation of the L hip mobility due to pain and muslcle tightness, weakness compared bil with reproduction of symptoms. TTP in the glute/ piriformis region with reproduction of symptoms. relief of pain with manual trigger point release techniques. limited special testing for the low back but no centralization or peripheralization noted with repeated motions indicating higher probability of piriformis syndrome. she would benefit from physical therapy to decrease hip pain, improve mobility, increase strength and return pt to PLOF by addressing the deficits listed below.   History and Personal Factors relevant to plan of care: L THA, RTHA, hx DDD   Clinical  Presentation Evolving   Clinical Presentation due to: fluctuating pain, limited hip mobility, limited strength   Clinical Decision Making Moderate   Rehab Potential Good   PT Frequency 2x / week   PT Duration 6 weeks   PT Treatment/Interventions ADLs/Self Care Home Management;Cryotherapy;Electrical Stimulation;Iontophoresis 4mg /ml Dexamethasone;Moist Heat;Ultrasound;Dry needling;Taping;Manual techniques;Therapeutic activities;Therapeutic exercise;Balance training;Neuromuscular re-education;Patient/family education;Passive range of motion;Gait training;Stair training   PT Next Visit Plan review HEP and update PRN, assess LLD, discuss DN, piriformis stretching and soft tissue work, hip strengthening/ gentle mobs   PT Home Exercise Plan piriformis stretching, pelvic tilt (supine), sidelying hip abduction   Consulted and Agree with Plan of Care Patient      Patient will benefit from skilled therapeutic intervention in order to improve the following deficits and impairments:  Abnormal gait, Pain, Improper body mechanics, Postural dysfunction, Decreased strength, Decreased mobility, Decreased range of motion, Decreased endurance, Decreased activity tolerance, Decreased balance, Increased fascial restricitons, Increased muscle spasms, Difficulty walking, Hypomobility  Visit Diagnosis: Pain in left hip - Plan: PT plan of care cert/re-cert  Muscle weakness (generalized) - Plan: PT plan of care cert/re-cert  Other abnormalities of gait and mobility - Plan: PT plan of care cert/re-cert  Other muscle spasm - Plan: PT plan of care cert/re-cert      G-Codes - Apr 12, 2017 0945    Functional Assessment Tool Used (Outpatient Only) ROM, MMT, pain, FOTO   Functional Limitation Mobility: Walking and moving around   Mobility: Walking and Moving Around Current Status (O0370) At least 40 percent but less than 60 percent impaired, limited or restricted   Mobility: Walking and Moving Around Goal Status [phone]) At  least 20 percent but less than 40 percent impaired, limited or restricted       Problem List Patient Active Problem List   Diagnosis Date Noted  . Trochanteric bursitis, left hip 03/07/2017  . Lymphedema 12/06/2016  . Bilateral bunions 08/21/2016  . Seasonal allergies 07/06/2016  . Asthma 07/06/2016  . Sjogren's syndrome (HCC) 07/06/2016  . Bilateral primary osteoarthritis of knee 07/06/2016  . Osteoarthritis of both hands 07/06/2016  . Myofacial muscle pain 07/06/2016  . DDD lumbar spine with spinal stenosis 07/06/2016  . Abnormality of gait 06/26/2016  . Chronic pain of right ankle 06/26/2016  . H/O bilateral hip replacements 02/24/2016  . Varicose veins 04/20/2015  .  Lipidemia 04/20/2015  . Rheumatoid factor positive 11/20/2013  . Back pain with left-sided sciatica 11/20/2013  . HTN (hypertension) 11/20/2013  . GERD (gastroesophageal reflux disease) 11/20/2013  . Bladder spasm 11/20/2013  . Hyperlipidemia 11/20/2013  . Bilateral swelling of feet   . Bruises easily    Lulu Riding PT, DPT, LAT, ATC  03/27/17  9:47 AM      [facility] [address] [place], [place], [address] Phone: [phone]   Fax:  [phone]  Name: Kathy Duran MRN: [id number] Date of Birth: 12-13-1945

## 2017-04-02 ENCOUNTER — Encounter: Payer: Medicare PPO | Admitting: Physical Therapy

## 2017-04-03 ENCOUNTER — Ambulatory Visit: Payer: Medicare PPO | Admitting: Physical Therapy

## 2017-04-03 DIAGNOSIS — M25552 Pain in left hip: Secondary | ICD-10-CM | POA: Diagnosis not present

## 2017-04-03 DIAGNOSIS — M62838 Other muscle spasm: Secondary | ICD-10-CM | POA: Diagnosis not present

## 2017-04-03 DIAGNOSIS — M25671 Stiffness of right ankle, not elsewhere classified: Secondary | ICD-10-CM | POA: Diagnosis not present

## 2017-04-03 DIAGNOSIS — R2689 Other abnormalities of gait and mobility: Secondary | ICD-10-CM

## 2017-04-03 DIAGNOSIS — R279 Unspecified lack of coordination: Secondary | ICD-10-CM | POA: Diagnosis not present

## 2017-04-03 DIAGNOSIS — M25652 Stiffness of left hip, not elsewhere classified: Secondary | ICD-10-CM | POA: Diagnosis not present

## 2017-04-03 DIAGNOSIS — M25571 Pain in right ankle and joints of right foot: Secondary | ICD-10-CM | POA: Diagnosis not present

## 2017-04-03 DIAGNOSIS — R600 Localized edema: Secondary | ICD-10-CM | POA: Diagnosis not present

## 2017-04-03 DIAGNOSIS — M6281 Muscle weakness (generalized): Secondary | ICD-10-CM

## 2017-04-03 NOTE — Therapy (Signed)
[facility] Outpatient Rehabilitation [facility] [address] [place], [place], [address] Phone: [phone]   Fax:  [phone]  Physical Therapy Treatment  Patient Details  Name: CHARDA JANIS MRN: [id number] Date of Birth: 01-23-1946 Referring Provider: Allie Bossier Md  Encounter Date: 04/03/2017      PT End of Session - 04/03/17 1113    Visit Number 2   Number of Visits 13   Date for PT Re-Evaluation 05/08/17   Authorization Type MCR: Kx mod by 15th visit, Progress note by 10th visiti   PT Start Time 1102   PT Stop Time 1155   PT Time Calculation (min) 53 min      Past Medical History:  Diagnosis Date  . Anxiety   . Arthritis    left hip  . Asthma    Inhalers for tx  . Bilateral primary osteoarthritis of knee 07/06/2016   Moderate  . Bilateral swelling of feet    bilateral ankle and feet edema  . Bruises easily   . DDD (degenerative disc disease), lumbar 07/06/2016  . Hypertension   . Myofacial muscle pain 07/06/2016  . Osteoarthritis of both hands 07/06/2016  . Seasonal allergies 07/06/2016  . Sinus complaint   . Sjogren's syndrome (HCC)    dry eyes  . Tenonitis   . Urinary incontinence    occ. an issue wears pads    Past Surgical History:  Procedure Laterality Date  . ABDOMINAL HYSTERECTOMY    . BACK SURGERY    . BREAST CYST ASPIRATION    . BREAST SURGERY Right    cyst drainage  . CARPAL TUNNEL RELEASE Right 2001  . SHOULDER SURGERY Right    right shoulder tendon and rotator cuff repair  . TOTAL HIP ARTHROPLASTY Right 2006  . TOTAL HIP ARTHROPLASTY Left 02/24/2016   Procedure: LEFT TOTAL HIP ARTHROPLASTY ANTERIOR APPROACH;  Surgeon: Kathryne Hitch, MD;  Location: [facility];  Service: Orthopedics;  Laterality: Left;    There were no vitals filed for this visit.                       [facility] Adult PT Treatment/Exercise - 04/03/17 0001      Lumbar Exercises: Stretches   Active Hamstring Stretch 2 reps;20  seconds   Active Hamstring Stretch Limitations 90/90   Pelvic Tilt 10 seconds   Piriformis Stretch 2 reps;30 seconds   Piriformis Stretch Limitations IR and ER with foot on opposite knee- sheet assist was too pain ful      Lumbar Exercises: Supine   Clam 10 reps   Bridge 10 reps   Other Supine Lumbar Exercises ball squeeze  with pelvic tilt      Modalities   Modalities Cryotherapy     Cryotherapy   Number Minutes Cryotherapy 10 Minutes   Cryotherapy Location Hip   Type of Cryotherapy Ice pack     Manual Therapy   Manual Therapy Joint mobilization   Manual therapy comments massage roller to left lateral and posterior hip   Joint Mobilization Gentle long axis distraction grade 2 and A/P mobs left hip                 PT Education - 04/03/17 1331    Education provided Yes   Education Details HEP   Person(s) Educated Patient   Methods Explanation;Handout   Comprehension Verbalized understanding          PT Short Term Goals - 03/27/17 5456  PT SHORT TERM GOAL #1   Title pt to be I with initial HEP    Time 3   Period Weeks   Status New   Target Date 04/17/17     PT SHORT TERM GOAL #2   Title pt to verbalize / demo proper posture and lifting mechanics to prevent / reduce hip/ low back pain   Time 3   Period Weeks   Status New   Target Date 04/17/17     PT SHORT TERM GOAL #3   Title pt will report decreased LLE referral symptoms as intermittent to demonstrate improvement of condition   Time 3   Period Weeks   Status New   Target Date 04/17/17           PT Long Term Goals - 03/27/17 0940      PT LONG TERM GOAL #1   Title pt to increase L hip flexion to >/= 90 degrees and extension and IR/ER by >/= 8 degrees with </= 2/10 pain for functional mobility required for ADLs and proper gait   Time 6   Period Weeks   Status New   Target Date 05/08/17     PT LONG TERM GOAL #2   Title increase LLE strength to >/= 4/5 to promote hip / knee stability  with walking / standing activities    Time 6   Period Weeks   Status New   Target Date 05/08/17     PT LONG TERM GOAL #3   Title report no referral in the LLE for >/= 1 week and pain in the hip to </= 1/10 for improvement in condition    Time 6   Period Weeks   Status New   Target Date 05/08/17     PT LONG TERM GOAL #4   Title pt to be able to sit/ stand and walk for >/= 60 min with </= 1/10 pain for community ambulation / ADLS and pt's personal goals of returning to exercise    Time 6   Period Weeks   Status New   Target Date 05/08/17     PT LONG TERM GOAL #5   Title increase FOTO to </= 46% limited to demo improvement in condition   Time 6   Period Weeks   Status New   Target Date 05/08/17               Plan - 04/03/17 1145    Clinical Impression Statement Pt reports piriformis stretch using sheet assist very painful. Reviewed foot to ankle with gentle IR/ER stretching which she thought was less painful. Added hamstring stretch at 90/90. Gentle therex during treatment to not aggravate pain and edema in left hip. Massage roller used to left hip/ gluteal followed by ice pack to calm down edema.    PT Next Visit Plan review HEP and update PRN, assess LLD, discuss DN, piriformis stretching and soft tissue work, hip strengthening/ gentle mobs   PT Home Exercise Plan piriformis stretching, pelvic tilt (supine), sidelying hip abduction, hamstring stretch 90/90    Consulted and Agree with Plan of Care Patient      Patient will benefit from skilled therapeutic intervention in order to improve the following deficits and impairments:     Visit Diagnosis: Pain in left hip  Muscle weakness (generalized)  Other abnormalities of gait and mobility  Other muscle spasm     Problem List Patient Active Problem List   Diagnosis Date Noted  . Trochanteric bursitis,  left hip 03/07/2017  . Lymphedema 12/06/2016  . Bilateral bunions 08/21/2016  . Seasonal allergies 07/06/2016   . Asthma 07/06/2016  . Sjogren's syndrome (HCC) 07/06/2016  . Bilateral primary osteoarthritis of knee 07/06/2016  . Osteoarthritis of both hands 07/06/2016  . Myofacial muscle pain 07/06/2016  . DDD lumbar spine with spinal stenosis 07/06/2016  . Abnormality of gait 06/26/2016  . Chronic pain of right ankle 06/26/2016  . H/O bilateral hip replacements 02/24/2016  . Varicose veins 04/20/2015  . Lipidemia 04/20/2015  . Rheumatoid factor positive 11/20/2013  . Back pain with left-sided sciatica 11/20/2013  . HTN (hypertension) 11/20/2013  . GERD (gastroesophageal reflux disease) 11/20/2013  . Bladder spasm 11/20/2013  . Hyperlipidemia 11/20/2013  . Bilateral swelling of feet   . Bruises easily     Sherrie Mustache, [place] 04/03/2017, 1:33 PM  [facility] [address] [place], [place], [address] Phone: [phone]   Fax:  [phone]  Name: RONASIA ISOLA MRN: [id number] Date of Birth: 10/05/1945

## 2017-04-08 ENCOUNTER — Encounter: Payer: Self-pay | Admitting: Physical Therapy

## 2017-04-08 ENCOUNTER — Ambulatory Visit: Payer: Medicare PPO | Admitting: Physical Therapy

## 2017-04-08 DIAGNOSIS — M62838 Other muscle spasm: Secondary | ICD-10-CM

## 2017-04-08 DIAGNOSIS — M25652 Stiffness of left hip, not elsewhere classified: Secondary | ICD-10-CM | POA: Diagnosis not present

## 2017-04-08 DIAGNOSIS — R600 Localized edema: Secondary | ICD-10-CM

## 2017-04-08 DIAGNOSIS — M25571 Pain in right ankle and joints of right foot: Secondary | ICD-10-CM | POA: Diagnosis not present

## 2017-04-08 DIAGNOSIS — R279 Unspecified lack of coordination: Secondary | ICD-10-CM

## 2017-04-08 DIAGNOSIS — M25552 Pain in left hip: Secondary | ICD-10-CM

## 2017-04-08 DIAGNOSIS — R2689 Other abnormalities of gait and mobility: Secondary | ICD-10-CM | POA: Diagnosis not present

## 2017-04-08 DIAGNOSIS — M25671 Stiffness of right ankle, not elsewhere classified: Secondary | ICD-10-CM

## 2017-04-08 DIAGNOSIS — M6281 Muscle weakness (generalized): Secondary | ICD-10-CM | POA: Diagnosis not present

## 2017-04-08 DIAGNOSIS — R262 Difficulty in walking, not elsewhere classified: Secondary | ICD-10-CM

## 2017-04-08 NOTE — Therapy (Signed)
[facility] Outpatient Rehabilitation [facility] [address] [place], [place], [address] Phone: [phone]   Fax:  [phone]  Physical Therapy Treatment  Patient Details  Name: Kathy Duran MRN: [id number] Date of Birth: [birth year] Referring Provider: Allie Bossier Md  Encounter Date: 04/08/2017      PT End of Session - 04/08/17 1310    Visit Number 3   Number of Visits 13   Date for PT Re-Evaluation 05/08/17   PT Start Time 1103   PT Stop Time 1143   PT Time Calculation (min) 40 min   Activity Tolerance Patient tolerated treatment well   Behavior During Therapy [facility] for tasks assessed/performed      Past Medical History:  Diagnosis Date  . Anxiety   . Arthritis    left hip  . Asthma    Inhalers for tx  . Bilateral primary osteoarthritis of knee 07/06/2016   Moderate  . Bilateral swelling of feet    bilateral ankle and feet edema  . Bruises easily   . DDD (degenerative disc disease), lumbar 07/06/2016  . Hypertension   . Myofacial muscle pain 07/06/2016  . Osteoarthritis of both hands 07/06/2016  . Seasonal allergies 07/06/2016  . Sinus complaint   . Sjogren's syndrome (HCC)    dry eyes  . Tenonitis   . Urinary incontinence    occ. an issue wears pads    Past Surgical History:  Procedure Laterality Date  . ABDOMINAL HYSTERECTOMY    . BACK SURGERY    . BREAST CYST ASPIRATION    . BREAST SURGERY Right    cyst drainage  . CARPAL TUNNEL RELEASE Right 2001  . SHOULDER SURGERY Right    right shoulder tendon and rotator cuff repair  . TOTAL HIP ARTHROPLASTY Right 2006  . TOTAL HIP ARTHROPLASTY Left 02/24/2016   Procedure: LEFT TOTAL HIP ARTHROPLASTY ANTERIOR APPROACH;  Surgeon: Kathryne Hitch, MD;  Location: [facility];  Service: Orthopedics;  Laterality: Left;    There were no vitals filed for this visit.      Subjective Assessment - 04/08/17 1107    Subjective pain is 5/10.  She is doing her exercises.  She is not in water  aerobics yet.   Pinch.  Able to sleep a little better.   Currently in Pain? Yes   Pain Score 5    Pain Location Hip   Pain Orientation Left   Pain Descriptors / Indicators --  pinch   Pain Type Chronic pain   Pain Radiating Towards to the back of the knee   Pain Frequency Constant   Aggravating Factors  longer sitting,  longer walking   Pain Relieving Factors laying down ,  heating pad,  Ice   Effect of Pain on Daily Activities wakes her at night,  limits walking and sitting.     Multiple Pain Sites --  Back is not hurting today.                         [facility] Adult PT Treatment/Exercise - 04/08/17 0001      Transfers   Comments uses her hands to move left leg on / off mat     Lumbar Exercises: Stretches   Active Hamstring Stretch 3 reps;30 seconds   Pelvic Tilt 5 reps   Piriformis Stretch 2 reps;10 seconds   Piriformis Stretch Limitations gentle     Lumbar Exercises: Standing   Other Standing Lumbar Exercises walking "YOGA"  style.  Noted improved heel strike left.   Other Standing Lumbar Exercises pre gait weight shifts facing wall 10 x each     Knee/Hip Exercises: Stretches   Passive Hamstring Stretch 3 reps;30 seconds   Passive Hamstring Stretch Limitations small motion,  gentle   Quad Stretch 3 reps;30 seconds   Quad Stretch Limitations off edge of mat   Hip Flexor Stretch 3 reps;30 seconds   Hip Flexor Stretch Limitations gentle thigh resting on mat,  ROM improved with reps.   Gastroc Stretch 3 reps;30 seconds   Gastroc Stretch Limitations incline board,  HEP   Other Knee/Hip Stretches Abductors 3 X 10 seconds sensitive. gentle stretches,  ROM limited     Moist Heat Therapy   Number Minutes Moist Heat --  concurrent with stretching and manual   Moist Heat Location --  placed various areas prior to stretching that area     Manual Therapy   Manual therapy comments retrograde soft tissue ,  patellar sustained medial/lateral glides, whole thigh  retrpgrade,  lateral iT band                PT Education - 04/08/17 1310    Education provided Yes   Education Details HEP   Person(s) Educated Patient   Methods Explanation;Demonstration;Verbal cues;Handout   Comprehension Verbalized understanding;Returned demonstration          PT Short Term Goals - 03/27/17 0938      PT SHORT TERM GOAL #1   Title pt to be I with initial HEP    Time 3   Period Weeks   Status New   Target Date 04/17/17     PT SHORT TERM GOAL #2   Title pt to verbalize / demo proper posture and lifting mechanics to prevent / reduce hip/ low back pain   Time 3   Period Weeks   Status New   Target Date 04/17/17     PT SHORT TERM GOAL #3   Title pt will report decreased LLE referral symptoms as intermittent to demonstrate improvement of condition   Time 3   Period Weeks   Status New   Target Date 04/17/17           PT Long Term Goals - 03/27/17 0940      PT LONG TERM GOAL #1   Title pt to increase L hip flexion to >/= 90 degrees and extension and IR/ER by >/= 8 degrees with </= 2/10 pain for functional mobility required for ADLs and proper gait   Time 6   Period Weeks   Status New   Target Date 05/08/17     PT LONG TERM GOAL #2   Title increase LLE strength to >/= 4/5 to promote hip / knee stability with walking / standing activities    Time 6   Period Weeks   Status New   Target Date 05/08/17     PT LONG TERM GOAL #3   Title report no referral in the LLE for >/= 1 week and pain in the hip to </= 1/10 for improvement in condition    Time 6   Period Weeks   Status New   Target Date 05/08/17     PT LONG TERM GOAL #4   Title pt to be able to sit/ stand and walk for >/= 60 min with </= 1/10 pain for community ambulation / ADLS and pt's personal goals of returning to exercise    Time 6   Period Weeks  Status New   Target Date 05/08/17     PT LONG TERM GOAL #5   Title increase FOTO to </= 46% limited to demo improvement in  condition   Time 6   Period Weeks   Status New   Target Date 05/08/17               Plan - 04/08/17 1311    Clinical Impression Statement Left hip ROM improving with hip flexion extimated to be 100 degrees.  ROM continues to be limited in the hip.  DF limited.  Some pain was reproduced with gastroc stretching.   Right. Less pain post session. Heel strike left improved  at end of session.   PT Next Visit Plan review HEP and update PRN, assess LLD, discuss DN, piriformis stretching and soft tissue work, hip strengthening/ gentle mobs   PT Home Exercise Plan piriformis stretching, pelvic tilt (supine), sidelying hip abduction, hamstring stretch 90/90    Consulted and Agree with Plan of Care Patient      Patient will benefit from skilled therapeutic intervention in order to improve the following deficits and impairments:     Visit Diagnosis: Pain in left hip  Muscle weakness (generalized)  Other abnormalities of gait and mobility  Other muscle spasm  Unspecified lack of coordination  Stiffness of left hip, not elsewhere classified  Localized edema  Stiffness of right ankle, not elsewhere classified  Pain in right ankle and joints of right foot  Difficulty in walking, not elsewhere classified     Problem List Patient Active Problem List   Diagnosis Date Noted  . Trochanteric bursitis, left hip 03/07/2017  . Lymphedema 12/06/2016  . Bilateral bunions 08/21/2016  . Seasonal allergies 07/06/2016  . Asthma 07/06/2016  . Sjogren's syndrome (HCC) 07/06/2016  . Bilateral primary osteoarthritis of knee 07/06/2016  . Osteoarthritis of both hands 07/06/2016  . Myofacial muscle pain 07/06/2016  . DDD lumbar spine with spinal stenosis 07/06/2016  . Abnormality of gait 06/26/2016  . Chronic pain of right ankle 06/26/2016  . H/O bilateral hip replacements 02/24/2016  . Varicose veins 04/20/2015  . Lipidemia 04/20/2015  . Rheumatoid factor positive 11/20/2013  . Back  pain with left-sided sciatica 11/20/2013  . HTN (hypertension) 11/20/2013  . GERD (gastroesophageal reflux disease) 11/20/2013  . Bladder spasm 11/20/2013  . Hyperlipidemia 11/20/2013  . Bilateral swelling of feet   . Bruises easily     HARRIS,KAREN PTA 04/08/2017, 1:15 PM  [facility] [address] [place], [place], [address] Phone: [phone]   Fax:  [phone]  Name: Kathy Duran MRN: [id number] Date of Birth: 1946-07-23

## 2017-04-08 NOTE — Patient Instructions (Signed)
Achilles / Gastroc, Standing    Stand, right foot behind, heel on floor,  leg straight, forward leg bent. Move hips forward. Hold __30_ seconds. Repeat __30_ times per session. Do __1_ sessions per day.  Copyright  VHI. All rights reserved.

## 2017-04-10 ENCOUNTER — Encounter: Payer: Self-pay | Admitting: Physical Therapy

## 2017-04-10 ENCOUNTER — Ambulatory Visit: Payer: Medicare PPO | Admitting: Physical Therapy

## 2017-04-10 DIAGNOSIS — M62838 Other muscle spasm: Secondary | ICD-10-CM | POA: Diagnosis not present

## 2017-04-10 DIAGNOSIS — R2689 Other abnormalities of gait and mobility: Secondary | ICD-10-CM | POA: Diagnosis not present

## 2017-04-10 DIAGNOSIS — M6281 Muscle weakness (generalized): Secondary | ICD-10-CM | POA: Diagnosis not present

## 2017-04-10 DIAGNOSIS — R279 Unspecified lack of coordination: Secondary | ICD-10-CM | POA: Diagnosis not present

## 2017-04-10 DIAGNOSIS — M25571 Pain in right ankle and joints of right foot: Secondary | ICD-10-CM | POA: Diagnosis not present

## 2017-04-10 DIAGNOSIS — M25671 Stiffness of right ankle, not elsewhere classified: Secondary | ICD-10-CM | POA: Diagnosis not present

## 2017-04-10 DIAGNOSIS — M25552 Pain in left hip: Secondary | ICD-10-CM | POA: Diagnosis not present

## 2017-04-10 DIAGNOSIS — M25652 Stiffness of left hip, not elsewhere classified: Secondary | ICD-10-CM | POA: Diagnosis not present

## 2017-04-10 DIAGNOSIS — R600 Localized edema: Secondary | ICD-10-CM | POA: Diagnosis not present

## 2017-04-10 NOTE — Patient Instructions (Signed)

## 2017-04-10 NOTE — Therapy (Signed)
[facility] [address] [place], [place], [address] Phone: [phone]   Fax:  [phone]  Physical Therapy Treatment  Patient Details  Name: Kathy Duran MRN: [id number] Date of Birth: [birth year] Referring Provider: Allie Bossier Md  Encounter Date: 04/10/2017      PT End of Session - 04/10/17 1234    Visit Number 4   Number of Visits 13   Date for PT Re-Evaluation 05/08/17   Authorization Type MCR: Kx mod by 15th visit, Progress note by 10th visiti   PT Start Time 1145   PT Stop Time 1231   PT Time Calculation (min) 46 min   Activity Tolerance Patient tolerated treatment well   Behavior During Therapy [facility] for tasks assessed/performed      Past Medical History:  Diagnosis Date  . Anxiety   . Arthritis    left hip  . Asthma    Inhalers for tx  . Bilateral primary osteoarthritis of knee 07/06/2016   Moderate  . Bilateral swelling of feet    bilateral ankle and feet edema  . Bruises easily   . DDD (degenerative disc disease), lumbar 07/06/2016  . Hypertension   . Myofacial muscle pain 07/06/2016  . Osteoarthritis of both hands 07/06/2016  . Seasonal allergies 07/06/2016  . Sinus complaint   . Sjogren's syndrome (HCC)    dry eyes  . Tenonitis   . Urinary incontinence    occ. an issue wears pads    Past Surgical History:  Procedure Laterality Date  . ABDOMINAL HYSTERECTOMY    . BACK SURGERY    . BREAST CYST ASPIRATION    . BREAST SURGERY Right    cyst drainage  . CARPAL TUNNEL RELEASE Right 2001  . SHOULDER SURGERY Right    right shoulder tendon and rotator cuff repair  . TOTAL HIP ARTHROPLASTY Right 2006  . TOTAL HIP ARTHROPLASTY Left 02/24/2016   Procedure: LEFT TOTAL HIP ARTHROPLASTY ANTERIOR APPROACH;  Surgeon: Kathryne Hitch, MD;  Location: [facility];  Service: Orthopedics;  Laterality: Left;    There were no vitals filed for this visit.      Subjective Assessment - 04/10/17 1150    Subjective "After the last session I was very sore, Yesterday I took it easy and I am still having trouble sleeping"    Currently in Pain? Yes   Pain Score 4    Pain Location Hip   Pain Orientation Left   Pain Type Chronic pain                         [facility] Adult PT Treatment/Exercise - 04/10/17 1152      Knee/Hip Exercises: Aerobic   Nustep L5 x 5 min LE only     Knee/Hip Exercises: Sidelying   Other Sidelying Knee/Hip Exercises reverse clam shell 2 x 10 LLE only     Modalities   Modalities Iontophoresis     Iontophoresis   Type of Iontophoresis Dexamethasone   Location L greater trochanter   Dose 4mg /ml   Time 6 hour stat patch     Manual Therapy   Manual Therapy Soft tissue mobilization   Joint Mobilization Gentle long axis distraction grade 3 and A/P mobs left hip    Soft tissue mobilization IASTM over the L glute med/ piriformis.          Trigger Point Dry Needling - 04/10/17 1225    Consent Given? Yes   Education Handout  Provided Yes   Muscles Treated Lower Body Piriformis   Piriformis Response Twitch response elicited;Palpable increased muscle length  L only              PT Education - 04/10/17 1235    Education provided Yes   Education Details muscle anatomy and referral patterns. what TPDN is, benefits/ want to expect and aftercare. Benefits of iontophoresis and length of wear.   Person(s) Educated Patient   Methods Explanation;Verbal cues;Handout   Comprehension Verbalized understanding;Verbal cues required          PT Short Term Goals - 03/27/17 0938      PT SHORT TERM GOAL #1   Title pt to be I with initial HEP    Time 3   Period Weeks   Status New   Target Date 04/17/17     PT SHORT TERM GOAL #2   Title pt to verbalize / demo proper posture and lifting mechanics to prevent / reduce hip/ low back pain   Time 3   Period Weeks   Status New   Target Date 04/17/17     PT SHORT TERM GOAL #3   Title pt will report  decreased LLE referral symptoms as intermittent to demonstrate improvement of condition   Time 3   Period Weeks   Status New   Target Date 04/17/17           PT Long Term Goals - 03/27/17 0940      PT LONG TERM GOAL #1   Title pt to increase L hip flexion to >/= 90 degrees and extension and IR/ER by >/= 8 degrees with </= 2/10 pain for functional mobility required for ADLs and proper gait   Time 6   Period Weeks   Status New   Target Date 05/08/17     PT LONG TERM GOAL #2   Title increase LLE strength to >/= 4/5 to promote hip / knee stability with walking / standing activities    Time 6   Period Weeks   Status New   Target Date 05/08/17     PT LONG TERM GOAL #3   Title report no referral in the LLE for >/= 1 week and pain in the hip to </= 1/10 for improvement in condition    Time 6   Period Weeks   Status New   Target Date 05/08/17     PT LONG TERM GOAL #4   Title pt to be able to sit/ stand and walk for >/= 60 min with </= 1/10 pain for community ambulation / ADLS and pt's personal goals of returning to exercise    Time 6   Period Weeks   Status New   Target Date 05/08/17     PT LONG TERM GOAL #5   Title increase FOTO to </= 46% limited to demo improvement in condition   Time 6   Period Weeks   Status New   Target Date 05/08/17               Plan - 04/10/17 1236    Clinical Impression Statement pt reports decreased N/T in the L hip/ LE. educated and performed DN on the piriformis. Following soft tissue work and mobs she reported pain dropped and she was able to stand without issues.    PT Treatment/Interventions ADLs/Self Care Home Management;Cryotherapy;Electrical Stimulation;Iontophoresis 4mg /ml Dexamethasone;Moist Heat;Ultrasound;Dry needling;Taping;Manual techniques;Therapeutic activities;Therapeutic exercise;Balance training;Neuromuscular re-education;Patient/family education;Passive range of motion;Gait training;Stair training   PT Next Visit Plan  assesses response to DN and iontophoresis assess LLD, discuss DN, piriformis stretching and soft tissue work, hip strengthening/ gentle mobs   PT Home Exercise Plan piriformis stretching, pelvic tilt (supine), sidelying hip abduction, hamstring stretch 90/90    Consulted and Agree with Plan of Care Patient      Patient will benefit from skilled therapeutic intervention in order to improve the following deficits and impairments:  Abnormal gait, Pain, Improper body mechanics, Postural dysfunction, Decreased strength, Decreased mobility, Decreased range of motion, Decreased endurance, Decreased activity tolerance, Decreased balance, Increased fascial restricitons, Increased muscle spasms, Difficulty walking, Hypomobility  Visit Diagnosis: Pain in left hip  Muscle weakness (generalized)  Other abnormalities of gait and mobility  Other muscle spasm     Problem List Patient Active Problem List   Diagnosis Date Noted  . Trochanteric bursitis, left hip 03/07/2017  . Lymphedema 12/06/2016  . Bilateral bunions 08/21/2016  . Seasonal allergies 07/06/2016  . Asthma 07/06/2016  . Sjogren's syndrome (HCC) 07/06/2016  . Bilateral primary osteoarthritis of knee 07/06/2016  . Osteoarthritis of both hands 07/06/2016  . Myofacial muscle pain 07/06/2016  . DDD lumbar spine with spinal stenosis 07/06/2016  . Abnormality of gait 06/26/2016  . Chronic pain of right ankle 06/26/2016  . H/O bilateral hip replacements 02/24/2016  . Varicose veins 04/20/2015  . Lipidemia 04/20/2015  . Rheumatoid factor positive 11/20/2013  . Back pain with left-sided sciatica 11/20/2013  . HTN (hypertension) 11/20/2013  . GERD (gastroesophageal reflux disease) 11/20/2013  . Bladder spasm 11/20/2013  . Hyperlipidemia 11/20/2013  . Bilateral swelling of feet   . Bruises easily    Lulu Riding PT, DPT, LAT, ATC  04/10/17  12:42 PM      [facility] Outpatient Rehabilitation [facility] [address] [place], [place], [address] Phone: [phone]   Fax:  [phone]  Name: Kathy Duran MRN: [id number] Date of Birth: [birth year]

## 2017-04-12 ENCOUNTER — Other Ambulatory Visit (INDEPENDENT_AMBULATORY_CARE_PROVIDER_SITE_OTHER): Payer: Self-pay | Admitting: Orthopaedic Surgery

## 2017-04-12 ENCOUNTER — Ambulatory Visit
Admission: RE | Admit: 2017-04-12 | Discharge: 2017-04-12 | Disposition: A | Discharge status: Home / Self Care | Payer: Medicare PPO | Source: Ambulatory Visit | Attending: Physician Assistant | Admitting: Physician Assistant

## 2017-04-12 DIAGNOSIS — M5432 Sciatica, left side: Secondary | ICD-10-CM

## 2017-04-12 DIAGNOSIS — M48061 Spinal stenosis, lumbar region without neurogenic claudication: Secondary | ICD-10-CM | POA: Diagnosis not present

## 2017-04-12 NOTE — Telephone Encounter (Signed)
Please advise 

## 2017-04-15 ENCOUNTER — Encounter: Payer: Self-pay | Admitting: Physical Therapy

## 2017-04-15 ENCOUNTER — Ambulatory Visit: Payer: Medicare PPO | Admitting: Physical Therapy

## 2017-04-15 DIAGNOSIS — M25652 Stiffness of left hip, not elsewhere classified: Secondary | ICD-10-CM | POA: Diagnosis not present

## 2017-04-15 DIAGNOSIS — M25552 Pain in left hip: Secondary | ICD-10-CM

## 2017-04-15 DIAGNOSIS — R279 Unspecified lack of coordination: Secondary | ICD-10-CM | POA: Diagnosis not present

## 2017-04-15 DIAGNOSIS — M62838 Other muscle spasm: Secondary | ICD-10-CM | POA: Diagnosis not present

## 2017-04-15 DIAGNOSIS — M25571 Pain in right ankle and joints of right foot: Secondary | ICD-10-CM | POA: Diagnosis not present

## 2017-04-15 DIAGNOSIS — R600 Localized edema: Secondary | ICD-10-CM | POA: Diagnosis not present

## 2017-04-15 DIAGNOSIS — M6281 Muscle weakness (generalized): Secondary | ICD-10-CM | POA: Diagnosis not present

## 2017-04-15 DIAGNOSIS — R2689 Other abnormalities of gait and mobility: Secondary | ICD-10-CM

## 2017-04-15 DIAGNOSIS — M25671 Stiffness of right ankle, not elsewhere classified: Secondary | ICD-10-CM | POA: Diagnosis not present

## 2017-04-15 NOTE — Therapy (Signed)
[facility] [address] [place], [place], [address] Phone: [phone]   Fax:  [phone]  Physical Therapy Treatment  Patient Details  Name: Kathy Duran MRN: [id number] Date of Birth: 03/27/1946 Referring Provider: Allie Bossier Md  Encounter Date: 04/15/2017      PT End of Session - 04/15/17 1156    Visit Number 5   Number of Visits 13   Date for PT Re-Evaluation 05/08/17   PT Start Time 1109  extra time needed at check-in   PT Stop Time 1147   PT Time Calculation (min) 38 min   Activity Tolerance Patient tolerated treatment well   Behavior During Therapy [facility] for tasks assessed/performed      Past Medical History:  Diagnosis Date  . Anxiety   . Arthritis    left hip  . Asthma    Inhalers for tx  . Bilateral primary osteoarthritis of knee 07/06/2016   Moderate  . Bilateral swelling of feet    bilateral ankle and feet edema  . Bruises easily   . DDD (degenerative disc disease), lumbar 07/06/2016  . Hypertension   . Myofacial muscle pain 07/06/2016  . Osteoarthritis of both hands 07/06/2016  . Seasonal allergies 07/06/2016  . Sinus complaint   . Sjogren's syndrome (HCC)    dry eyes  . Tenonitis   . Urinary incontinence    occ. an issue wears pads    Past Surgical History:  Procedure Laterality Date  . ABDOMINAL HYSTERECTOMY    . BACK SURGERY    . BREAST CYST ASPIRATION    . BREAST SURGERY Right    cyst drainage  . CARPAL TUNNEL RELEASE Right 2001  . SHOULDER SURGERY Right    right shoulder tendon and rotator cuff repair  . TOTAL HIP ARTHROPLASTY Right 2006  . TOTAL HIP ARTHROPLASTY Left 02/24/2016   Procedure: LEFT TOTAL HIP ARTHROPLASTY ANTERIOR APPROACH;  Surgeon: Kathryne Hitch, MD;  Location: [facility];  Service: Orthopedics;  Laterality: Left;    There were no vitals filed for this visit.      Subjective Assessment - 04/15/17 1105    Subjective "I am doing pretty good today, I have  some soreness, not pain"    Currently in Pain? Yes   Pain Score 2    Pain Location Hip   Pain Orientation Left   Pain Type Chronic pain   Aggravating Factors  sitting/ walking for long periods of time   Pain Relieving Factors laying down, DN,                         [facility] Adult PT Treatment/Exercise - 04/15/17 1131      Lumbar Exercises: Stretches   Lower Trunk Rotation --  1 x 10   Pelvic Tilt --  2 x 10 with tactile cues for proper form     Lumbar Exercises: Supine   Bent Knee Raise --  2 x 10 with sustained ADIM     Manual Therapy   Joint Mobilization Gentle long axis distraction grade 3 and A/P mobs left hip    Soft tissue mobilization IASTM over the L glute med/ piriformis.          Trigger Point Dry Needling - 04/15/17 1111    Consent Given? Yes   Education Handout Provided Yes   Muscles Treated Lower Body Piriformis;Gluteus minimus   Gluteus Minimus Response Twitch response elicited;Palpable increased muscle length  glute med  Piriformis Response Twitch response elicited;Palpable increased muscle length                PT Short Term Goals - 03/27/17 0938      PT SHORT TERM GOAL #1   Title pt to be I with initial HEP    Time 3   Period Weeks   Status New   Target Date 04/17/17     PT SHORT TERM GOAL #2   Title pt to verbalize / demo proper posture and lifting mechanics to prevent / reduce hip/ low back pain   Time 3   Period Weeks   Status New   Target Date 04/17/17     PT SHORT TERM GOAL #3   Title pt will report decreased LLE referral symptoms as intermittent to demonstrate improvement of condition   Time 3   Period Weeks   Status New   Target Date 04/17/17           PT Long Term Goals - 03/27/17 0940      PT LONG TERM GOAL #1   Title pt to increase L hip flexion to >/= 90 degrees and extension and IR/ER by >/= 8 degrees with </= 2/10 pain for functional mobility required for ADLs and proper gait   Time 6    Period Weeks   Status New   Target Date 05/08/17     PT LONG TERM GOAL #2   Title increase LLE strength to >/= 4/5 to promote hip / knee stability with walking / standing activities    Time 6   Period Weeks   Status New   Target Date 05/08/17     PT LONG TERM GOAL #3   Title report no referral in the LLE for >/= 1 week and pain in the hip to </= 1/10 for improvement in condition    Time 6   Period Weeks   Status New   Target Date 05/08/17     PT LONG TERM GOAL #4   Title pt to be able to sit/ stand and walk for >/= 60 min with </= 1/10 pain for community ambulation / ADLS and pt's personal goals of returning to exercise    Time 6   Period Weeks   Status New   Target Date 05/08/17     PT LONG TERM GOAL #5   Title increase FOTO to </= 46% limited to demo improvement in condition   Time 6   Period Weeks   Status New   Target Date 05/08/17               Plan - 04/15/17 1157    Clinical Impression Statement pt reports improvement since the last session and reports only soreness but no pain today. continued DN for the piriformis and added the glute medius followed with soft tissue techniques. continued working on core strengthening and hip flexior activation following mobs to promote hip flexor activation. cotninued ionto post session over the greater trochanter. post session she reported only muscle soreness.    PT Treatment/Interventions ADLs/Self Care Home Management;Cryotherapy;Electrical Stimulation;Iontophoresis 4mg /ml Dexamethasone;Moist Heat;Ultrasound;Dry needling;Taping;Manual techniques;Therapeutic activities;Therapeutic exercise;Balance training;Neuromuscular re-education;Patient/family education;Passive range of motion;Gait training;Stair training   PT Next Visit Plan assesses response to DN and iontophoresis assess LLD, discuss DN, piriformis stretching and soft tissue work, hip strengthening/ gentle mobs   PT Home Exercise Plan piriformis stretching, pelvic tilt  (supine), sidelying hip abduction, hamstring stretch 90/90    Consulted and Agree with Plan of Care  Patient      Patient will benefit from skilled therapeutic intervention in order to improve the following deficits and impairments:  Abnormal gait, Pain, Improper body mechanics, Postural dysfunction, Decreased strength, Decreased mobility, Decreased range of motion, Decreased endurance, Decreased activity tolerance, Decreased balance, Increased fascial restricitons, Increased muscle spasms, Difficulty walking, Hypomobility  Visit Diagnosis: Pain in left hip  Muscle weakness (generalized)  Other abnormalities of gait and mobility  Other muscle spasm     Problem List Patient Active Problem List   Diagnosis Date Noted  . Trochanteric bursitis, left hip 03/07/2017  . Lymphedema 12/06/2016  . Bilateral bunions 08/21/2016  . Seasonal allergies 07/06/2016  . Asthma 07/06/2016  . Sjogren's syndrome (HCC) 07/06/2016  . Bilateral primary osteoarthritis of knee 07/06/2016  . Osteoarthritis of both hands 07/06/2016  . Myofacial muscle pain 07/06/2016  . DDD lumbar spine with spinal stenosis 07/06/2016  . Abnormality of gait 06/26/2016  . Chronic pain of right ankle 06/26/2016  . H/O bilateral hip replacements 02/24/2016  . Varicose veins 04/20/2015  . Lipidemia 04/20/2015  . Rheumatoid factor positive 11/20/2013  . Back pain with left-sided sciatica 11/20/2013  . HTN (hypertension) 11/20/2013  . GERD (gastroesophageal reflux disease) 11/20/2013  . Bladder spasm 11/20/2013  . Hyperlipidemia 11/20/2013  . Bilateral swelling of feet   . Bruises easily    Lulu Riding PT, DPT, LAT, ATC  04/15/17  12:42 PM      [facility] [address] [place], [place], [address] Phone: [phone]   Fax:  [phone]  Name: Kathy Duran MRN: [id number] Date of Birth: Dec 12, 1945

## 2017-04-17 ENCOUNTER — Ambulatory Visit: Payer: Medicare PPO | Admitting: Physical Therapy

## 2017-04-22 ENCOUNTER — Encounter: Payer: Medicare PPO | Admitting: Physical Therapy

## 2017-04-24 ENCOUNTER — Ambulatory Visit: Payer: Medicare PPO | Attending: Internal Medicine | Admitting: Physical Therapy

## 2017-04-24 ENCOUNTER — Ambulatory Visit (INDEPENDENT_AMBULATORY_CARE_PROVIDER_SITE_OTHER): Payer: Medicare PPO | Admitting: Orthopaedic Surgery

## 2017-04-24 ENCOUNTER — Other Ambulatory Visit (INDEPENDENT_AMBULATORY_CARE_PROVIDER_SITE_OTHER): Payer: Self-pay

## 2017-04-24 ENCOUNTER — Encounter: Payer: Self-pay | Admitting: Physical Therapy

## 2017-04-24 DIAGNOSIS — M62838 Other muscle spasm: Secondary | ICD-10-CM | POA: Diagnosis not present

## 2017-04-24 DIAGNOSIS — R2689 Other abnormalities of gait and mobility: Secondary | ICD-10-CM | POA: Diagnosis not present

## 2017-04-24 DIAGNOSIS — M6281 Muscle weakness (generalized): Secondary | ICD-10-CM | POA: Insufficient documentation

## 2017-04-24 DIAGNOSIS — G8929 Other chronic pain: Secondary | ICD-10-CM

## 2017-04-24 DIAGNOSIS — M5432 Sciatica, left side: Secondary | ICD-10-CM | POA: Diagnosis not present

## 2017-04-24 DIAGNOSIS — M25552 Pain in left hip: Secondary | ICD-10-CM | POA: Diagnosis not present

## 2017-04-24 DIAGNOSIS — M5442 Lumbago with sciatica, left side: Principal | ICD-10-CM

## 2017-04-24 NOTE — Progress Notes (Signed)
The patient is coming for follow-up after having an MRI of her lumbar spine. She is having significant radicular symptoms going down her left leg. She's had a left total hip arthroplasty but has been doing well. She's developed severe pain this week.  On exam she is a positive straight leg raise on left side and her whole left entire hip is hurting her quite a bit.  MRI is reviewed and does show disc herniations and impingement at mainly L5-S1 but there is also reason to feel that there is compromise at L5-L4 and L4-L5 to the left side.  At this point I do feel that she'll benefit from an epidural steroid injection most likely at the left L5-S1 but they make to consider a higher level as well depending on her clinical exam findings. All questions were addressed. We'll work on setting this epidural steroid injection.

## 2017-04-24 NOTE — Therapy (Signed)
[place], [place], [address] Phone: [phone]   Fax:  [phone]  Physical Therapy Treatment  Patient Details  Name: Kathy Duran MRN: [id number] Date of Birth: 09-18-1945 Referring Provider: Zollie Beckers Md  Encounter Date: 04/24/2017      PT End of Session - 04/24/17 1244    Visit Number 6   Number of Visits 13   Date for PT Re-Evaluation 05/08/17   Authorization Type MCR: Kx mod by 15th visit, Progress note by 10th visiti   PT Start Time 1101   PT Stop Time 1146   PT Time Calculation (min) 45 min   Activity Tolerance Patient tolerated treatment well   Behavior During Therapy Kathy Duran for tasks assessed/performed      Past Medical History:  Diagnosis Date  . Anxiety   . Arthritis    left hip  . Asthma    Inhalers for tx  . Bilateral primary osteoarthritis of knee 07/06/2016   Moderate  . Bilateral swelling of feet    bilateral ankle and feet edema  . Bruises easily   . DDD (degenerative disc disease), lumbar 07/06/2016  . Hypertension   . Myofacial muscle pain 07/06/2016  . Osteoarthritis of both hands 07/06/2016  . Seasonal allergies 07/06/2016  . Sinus complaint   . Sjogren's syndrome ([place])    dry eyes  . Tenonitis   . Urinary incontinence    occ. an issue wears pads    Past Surgical History:  Procedure Laterality Date  . ABDOMINAL HYSTERECTOMY    . BACK SURGERY    . BREAST CYST ASPIRATION    . BREAST SURGERY Right    cyst drainage  . CARPAL TUNNEL RELEASE Right 2001  . SHOULDER SURGERY Right    right shoulder tendon and rotator cuff repair  . TOTAL HIP ARTHROPLASTY Right 2006  . TOTAL HIP ARTHROPLASTY Left 02/24/2016   Procedure: LEFT TOTAL HIP ARTHROPLASTY ANTERIOR APPROACH;  Surgeon: Mcarthur Rossetti, MD;  Location: [facility];  Service: Orthopedics;  Laterality: Left;    There were no vitals filed for this visit.      Subjective Assessment - 04/24/17 1108    Subjective "I am doing pretty good, I no longer have pain in the back of the leg, I have been having most of my pain in the front of the leg with difficulty lifting the leg"    Currently in Pain? Yes   Pain Score 2    Pain Location Hip   Pain Orientation Left   Pain Type Chronic pain   Pain Onset 1 to 4 weeks ago   Pain Frequency Constant   Aggravating Factors  prolonged sitting, walking   Pain Relieving Factors laying down, DN                         [facility] Adult PT Treatment/Exercise - 04/24/17 1241      Lumbar Exercises: Stretches   Pelvic Tilt 5 reps;30 seconds     Lumbar Exercises: Supine   Heel Slides --  1 x 12   Heel Slides Limitations with sustained posterior pelvic tilt and verbal cues for ADIM     Knee/Hip Exercises: Stretches   Hip Flexor Stretch 2 reps;30 seconds;Left     Iontophoresis   Type of Iontophoresis Dexamethasone   Location proximal rectus femoris   Dose 25m/ml   Time 6 hour stat patch     Manual Therapy  Soft tissue mobilization IASTM over the L hip flexor with scanning and twisting          Trigger Point Dry Needling - 04/24/17 1120    Consent Given? Yes   Education Handout Provided Yes   Muscles Treated Lower Body Quadriceps   Quadriceps Response Twitch response elicited;Palpable increased muscle length  rectus femoris                PT Short Term Goals - 04/24/17 1251      PT SHORT TERM GOAL #1   Title pt to be I with initial HEP    Time 3   Period Weeks   Status Partially Met     PT SHORT TERM GOAL #2   Title pt to verbalize / demo proper posture and lifting mechanics to prevent / reduce hip/ low back pain   Time 3   Period Weeks   Status Partially Met     PT SHORT TERM GOAL #3   Title pt will report decreased LLE referral symptoms as intermittent to demonstrate improvement of condition   Period Weeks   Status On-going     PT SHORT TERM GOAL #4   Title ambulate with increased hip extension and  abduction due to increased tissue mobility   Time 4   Period Weeks   Status Achieved           PT Long Term Goals - 03/27/17 0940      PT LONG TERM GOAL #1   Title pt to increase L hip flexion to >/= 90 degrees and extension and IR/ER by >/= 8 degrees with </= 2/10 pain for functional mobility required for ADLs and proper gait   Time 6   Period Weeks   Status New   Target Date 05/08/17     PT LONG TERM GOAL #2   Title increase LLE strength to >/= 4/5 to promote hip / knee stability with walking / standing activities    Time 6   Period Weeks   Status New   Target Date 05/08/17     PT LONG TERM GOAL #3   Title report no referral in the LLE for >/= 1 week and pain in the hip to </= 1/10 for improvement in condition    Time 6   Period Weeks   Status New   Target Date 05/08/17     PT LONG TERM GOAL #4   Title pt to be able to sit/ stand and walk for >/= 60 min with </= 1/10 pain for community ambulation / ADLS and pt's personal goals of returning to exercise    Time 6   Period Weeks   Status New   Target Date 05/08/17     PT LONG TERM GOAL #5   Title increase FOTO to </= 46% limited to demo improvement in condition   Time 6   Period Weeks   Status New   Target Date 05/08/17               Plan - 04/24/17 1245    Clinical Impression Statement Mrs  Bump reports she no longer has any pain in the back of the hip or referral down the LLE. She reports new pain and tightness in the L proximal hip flexors with signifcant tightness and report of swelling. Continued TPDN over the rectus femoris, and IASTM techniques  and stretching. Applied ionto patch to decrease pain and tightness. pt continued to have hip flexor tightness and  significant spasm with  transitioning from supine<> sit requiring Mod assist with transfer. pt reports she sees her MD today for further assessment.    PT Treatment/Interventions ADLs/Self Care Home Management;Cryotherapy;Electrical  Stimulation;Iontophoresis 56m/ml Dexamethasone;Moist Heat;Ultrasound;Dry needling;Taping;Manual techniques;Therapeutic activities;Therapeutic exercise;Balance training;Neuromuscular re-education;Patient/family education;Passive range of motion;Gait training;Stair training   PT Next Visit Plan assesses response to DN and iontophoresis assess LLD, discuss DN, piriformis stretching and soft tissue work, hip strengthening/ gentle mobs, how as MD visit    PT Home Exercise Plan piriformis stretching, pelvic tilt (supine), sidelying hip abduction, hamstring stretch 90/90    Consulted and Agree with Plan of Care Patient      Patient will benefit from skilled therapeutic intervention in order to improve the following deficits and impairments:     Visit Diagnosis: Pain in left hip  Muscle weakness (generalized)  Other abnormalities of gait and mobility  Other muscle spasm     Problem List Patient Active Problem List   Diagnosis Date Noted  . Trochanteric bursitis, left hip 03/07/2017  . Lymphedema 12/06/2016  . Bilateral bunions 08/21/2016  . Seasonal allergies 07/06/2016  . Asthma 07/06/2016  . Sjogren's syndrome ([place] 07/06/2016  . Bilateral primary osteoarthritis of knee 07/06/2016  . Osteoarthritis of both hands 07/06/2016  . Myofacial muscle pain 07/06/2016  . DDD lumbar spine with spinal stenosis 07/06/2016  . Abnormality of gait 06/26/2016  . Chronic pain of right ankle 06/26/2016  . H/O bilateral hip replacements 02/24/2016  . Varicose veins 04/20/2015  . Lipidemia 04/20/2015  . Rheumatoid factor positive 11/20/2013  . Back pain with left-sided sciatica 11/20/2013  . HTN (hypertension) 11/20/2013  . GERD (gastroesophageal reflux disease) 11/20/2013  . Bladder spasm 11/20/2013  . Hyperlipidemia 11/20/2013  . Bilateral swelling of feet   . Bruises easily    KStarr LakePT, DPT, LAT, ATC  04/24/17  12:53 PM      [facility] [address]Phone: [phone]  Fax:  [phone] Name: WPORSCHEA BORYSMRN: [id number] of Birth: 618-Apr-1947

## 2017-04-25 ENCOUNTER — Other Ambulatory Visit (INDEPENDENT_AMBULATORY_CARE_PROVIDER_SITE_OTHER): Payer: Self-pay | Admitting: Orthopaedic Surgery

## 2017-04-25 DIAGNOSIS — M5442 Lumbago with sciatica, left side: Principal | ICD-10-CM

## 2017-04-25 DIAGNOSIS — G8929 Other chronic pain: Secondary | ICD-10-CM

## 2017-04-29 ENCOUNTER — Encounter: Payer: Medicare PPO | Admitting: Physical Therapy

## 2017-05-01 ENCOUNTER — Ambulatory Visit: Payer: Medicare PPO | Admitting: Physical Therapy

## 2017-05-01 ENCOUNTER — Encounter: Payer: Self-pay | Admitting: Physical Therapy

## 2017-05-01 ENCOUNTER — Other Ambulatory Visit (INDEPENDENT_AMBULATORY_CARE_PROVIDER_SITE_OTHER): Payer: Self-pay | Admitting: Orthopaedic Surgery

## 2017-05-01 DIAGNOSIS — M6281 Muscle weakness (generalized): Secondary | ICD-10-CM

## 2017-05-01 DIAGNOSIS — M25552 Pain in left hip: Secondary | ICD-10-CM

## 2017-05-01 DIAGNOSIS — R2689 Other abnormalities of gait and mobility: Secondary | ICD-10-CM

## 2017-05-01 DIAGNOSIS — M62838 Other muscle spasm: Secondary | ICD-10-CM | POA: Diagnosis not present

## 2017-05-01 NOTE — Therapy (Signed)
[place], [place], [address] Phone: [phone]   Fax:  [phone]  Physical Therapy Treatment  Patient Details  Name: Kathy Duran MRN: [id number] Date of Birth: 12-Jul-1946 Referring Provider: Zollie Beckers Md  Encounter Date: 05/01/2017      PT End of Session - 05/01/17 1037    Visit Number 7   Number of Visits 13   Date for PT Re-Evaluation 05/08/17   Authorization Type MCR: Kx mod by 15th visit, Progress note by 10th visiti   PT Start Time 1016   PT Stop Time 1105   PT Time Calculation (min) 49 min   Activity Tolerance Patient tolerated treatment well   Behavior During Therapy [facility] for tasks assessed/performed      Past Medical History:  Diagnosis Date  . Anxiety   . Arthritis    left hip  . Asthma    Inhalers for tx  . Bilateral primary osteoarthritis of knee 07/06/2016   Moderate  . Bilateral swelling of feet    bilateral ankle and feet edema  . Bruises easily   . DDD (degenerative disc disease), lumbar 07/06/2016  . Hypertension   . Myofacial muscle pain 07/06/2016  . Osteoarthritis of both hands 07/06/2016  . Seasonal allergies 07/06/2016  . Sinus complaint   . Sjogren's syndrome ([place])    dry eyes  . Tenonitis   . Urinary incontinence    occ. an issue wears pads    Past Surgical History:  Procedure Laterality Date  . ABDOMINAL HYSTERECTOMY    . BACK SURGERY    . BREAST CYST ASPIRATION    . BREAST SURGERY Right    cyst drainage  . CARPAL TUNNEL RELEASE Right 2001  . SHOULDER SURGERY Right    right shoulder tendon and rotator cuff repair  . TOTAL HIP ARTHROPLASTY Right 2006  . TOTAL HIP ARTHROPLASTY Left 02/24/2016   Procedure: LEFT TOTAL HIP ARTHROPLASTY ANTERIOR APPROACH;  Surgeon: Mcarthur Rossetti, MD;  Location: [facility];  Service: Orthopedics;  Laterality: Left;    There were no vitals filed for this visit.      Subjective Assessment - 05/01/17 1020    Subjective "I saw the MD and he told me the results of my MRI, and I have an injection scheduled for tomorrow.    Currently in Pain? Yes   Pain Score 1    Pain Orientation Left   Pain Type Chronic pain   Pain Onset More than a month ago   Pain Frequency Intermittent   Aggravating Factors  prolonged sitting/ walking   Pain Relieving Factors laying down,                          [facility] Adult PT Treatment/Exercise - 05/01/17 1023      Lumbar Exercises: Stretches   Standing Extension --  3 x 10 performed intermittently throughout session     Knee/Hip Exercises: Stretches   Hip Flexor Stretch 2 reps;30 seconds  during standing in //     Knee/Hip Exercises: Aerobic   Nustep L6 x 8 min UE/LE     Knee/Hip Exercises: Standing   Hip Abduction 2 sets;Both;10 reps  in //   Hip Extension Stengthening;Both;2 sets;10 reps;Knee straight  in //     Knee/Hip Exercises: Seated   Long Arc Quad 2 sets;15 reps   Long Arc Quad Limitations 2 sets LLE only with ball squeeze  Modalities   Modalities Cryotherapy     Cryotherapy   Number Minutes Cryotherapy 10 Minutes   Cryotherapy Location Hip   Type of Cryotherapy Ice pack  in sitting                PT Education - 05/01/17 1036    Education provided Yes   Education Details disc anatomy and biomechanics.    Person(s) Educated Patient   Methods Explanation;Verbal cues;Handout   Comprehension Verbalized understanding;Verbal cues required          PT Short Term Goals - 04/24/17 1251      PT SHORT TERM GOAL #1   Title pt to be I with initial HEP    Time 3   Period Weeks   Status Partially Met     PT SHORT TERM GOAL #2   Title pt to verbalize / demo proper posture and lifting mechanics to prevent / reduce hip/ low back pain   Time 3   Period Weeks   Status Partially Met     PT SHORT TERM GOAL #3   Title pt will report decreased LLE referral symptoms as intermittent to demonstrate improvement of  condition   Period Weeks   Status On-going     PT SHORT TERM GOAL #4   Title ambulate with increased hip extension and abduction due to increased tissue mobility   Time 4   Period Weeks   Status Achieved           PT Long Term Goals - 03/27/17 0940      PT LONG TERM GOAL #1   Title pt to increase L hip flexion to >/= 90 degrees and extension and IR/ER by >/= 8 degrees with </= 2/10 pain for functional mobility required for ADLs and proper gait   Time 6   Period Weeks   Status New   Target Date 05/08/17     PT LONG TERM GOAL #2   Title increase LLE strength to >/= 4/5 to promote hip / knee stability with walking / standing activities    Time 6   Period Weeks   Status New   Target Date 05/08/17     PT LONG TERM GOAL #3   Title report no referral in the LLE for >/= 1 week and pain in the hip to </= 1/10 for improvement in condition    Time 6   Period Weeks   Status New   Target Date 05/08/17     PT LONG TERM GOAL #4   Title pt to be able to sit/ stand and walk for >/= 60 min with </= 1/10 pain for community ambulation / ADLS and pt's personal goals of returning to exercise    Time 6   Period Weeks   Status New   Target Date 05/08/17     PT LONG TERM GOAL #5   Title increase FOTO to </= 46% limited to demo improvement in condition   Time 6   Period Weeks   Status New   Target Date 05/08/17               Plan - 05/01/17 1056    Clinical Impression Statement Focused primarily on standing exercises and repeated trunk extension exercises. She reported decreased pain with standing hip stretching but continues to having swelling in the L hip flexor / lateral hip. utilized ice pack post session with pt in sitting to calm muscle soreness.   PT Treatment/Interventions ADLs/Self Care Home  Management;Cryotherapy;Electrical Stimulation;Iontophoresis 68m/ml Dexamethasone;Moist Heat;Ultrasound;Dry needling;Taping;Manual techniques;Therapeutic activities;Therapeutic  exercise;Balance training;Neuromuscular re-education;Patient/family education;Passive range of motion;Gait training;Stair training   PT Next Visit Plan discuss DN, piriformis stretching and soft tissue work, hip strengthening/ gentle mobs, How was injection, repeated extension, standing exercise   PT Home Exercise Plan piriformis stretching, pelvic tilt (supine), sidelying hip abduction, hamstring stretch 90/90    Consulted and Agree with Plan of Care Patient      Patient will benefit from skilled therapeutic intervention in order to improve the following deficits and impairments:  Abnormal gait, Pain, Improper body mechanics, Postural dysfunction, Decreased strength, Decreased mobility, Decreased range of motion, Decreased endurance, Decreased activity tolerance, Decreased balance, Increased fascial restricitons, Increased muscle spasms, Difficulty walking, Hypomobility  Visit Diagnosis: Pain in left hip  Muscle weakness (generalized)  Other abnormalities of gait and mobility  Other muscle spasm     Problem List Patient Active Problem List   Diagnosis Date Noted  . Trochanteric bursitis, left hip 03/07/2017  . Lymphedema 12/06/2016  . Bilateral bunions 08/21/2016  . Seasonal allergies 07/06/2016  . Asthma 07/06/2016  . Sjogren's syndrome ([place] 07/06/2016  . Bilateral primary osteoarthritis of knee 07/06/2016  . Osteoarthritis of both hands 07/06/2016  . Myofacial muscle pain 07/06/2016  . DDD lumbar spine with spinal stenosis 07/06/2016  . Abnormality of gait 06/26/2016  . Chronic pain of right ankle 06/26/2016  . H/O bilateral hip replacements 02/24/2016  . Varicose veins 04/20/2015  . Lipidemia 04/20/2015  . Rheumatoid factor positive 11/20/2013  . Back pain with left-sided sciatica 11/20/2013  . HTN (hypertension) 11/20/2013  . GERD (gastroesophageal reflux disease) 11/20/2013  . Bladder spasm 11/20/2013  . Hyperlipidemia 11/20/2013  . Bilateral swelling of feet   .  Bruises easily    KStarr LakePT, DPT, LAT, ATC  05/01/17  11:02 AM      [facility]195 [address] AvenueGGlend[place] [address]Phone: [phone]  Fax:  [phone] Name: Kathy HORNBAKERMRN: [id number] of Birth: 6April 07, 1947

## 2017-05-02 ENCOUNTER — Ambulatory Visit
Admission: RE | Admit: 2017-05-02 | Discharge: 2017-05-02 | Disposition: A | Discharge status: Home / Self Care | Payer: Medicare PPO | Source: Ambulatory Visit | Attending: Orthopaedic Surgery | Admitting: Orthopaedic Surgery

## 2017-05-02 ENCOUNTER — Other Ambulatory Visit: Payer: Medicare PPO

## 2017-05-02 DIAGNOSIS — M5442 Lumbago with sciatica, left side: Principal | ICD-10-CM

## 2017-05-02 DIAGNOSIS — M5126 Other intervertebral disc displacement, lumbar region: Secondary | ICD-10-CM | POA: Diagnosis not present

## 2017-05-02 DIAGNOSIS — G8929 Other chronic pain: Secondary | ICD-10-CM

## 2017-05-02 MED ORDER — IOPAMIDOL (ISOVUE-M 200) INJECTION 41%
1.0000 mL | Freq: Once | INTRAMUSCULAR | Status: AC
  Administered 2017-05-02: 1 mL via EPIDURAL

## 2017-05-02 MED ORDER — METHYLPREDNISOLONE ACETATE 40 MG/ML INJ SUSP (RADIOLOG
120.0000 mg | Freq: Once | INTRAMUSCULAR | Status: AC
  Administered 2017-05-02: 120 mg via EPIDURAL

## 2017-05-02 NOTE — Discharge Instructions (Signed)

## 2017-05-06 ENCOUNTER — Encounter: Payer: Self-pay | Admitting: Physical Therapy

## 2017-05-06 ENCOUNTER — Ambulatory Visit: Payer: Medicare PPO | Admitting: Physical Therapy

## 2017-05-06 DIAGNOSIS — M25552 Pain in left hip: Secondary | ICD-10-CM | POA: Diagnosis not present

## 2017-05-06 DIAGNOSIS — M6281 Muscle weakness (generalized): Secondary | ICD-10-CM

## 2017-05-06 DIAGNOSIS — M62838 Other muscle spasm: Secondary | ICD-10-CM | POA: Diagnosis not present

## 2017-05-06 DIAGNOSIS — R2689 Other abnormalities of gait and mobility: Secondary | ICD-10-CM | POA: Diagnosis not present

## 2017-05-06 NOTE — Therapy (Signed)
[place], [place], [address] Phone: [phone]   Fax:  [phone]  Physical Therapy Treatment / Re-certification  Patient Details  Name: Kathy Duran MRN: [id number] Date of Birth: February 11, 1946 Referring Provider: Zollie Beckers Md  Encounter Date: 05/06/2017      PT End of Session - 05/06/17 1309    Visit Number 8   Number of Visits 13   Date for PT Re-Evaluation 06/17/17   Authorization Type MCR: Kx mod by 15th visit, Progress note by 18th visit   PT Start Time 1015   PT Stop Time 1102   PT Time Calculation (min) 47 min   Activity Tolerance Patient tolerated treatment well   Behavior During Therapy [facility] for tasks assessed/performed      Past Medical History:  Diagnosis Date  . Anxiety   . Arthritis    left hip  . Asthma    Inhalers for tx  . Bilateral primary osteoarthritis of knee 07/06/2016   Moderate  . Bilateral swelling of feet    bilateral ankle and feet edema  . Bruises easily   . DDD (degenerative disc disease), lumbar 07/06/2016  . Hypertension   . Myofacial muscle pain 07/06/2016  . Osteoarthritis of both hands 07/06/2016  . Seasonal allergies 07/06/2016  . Sinus complaint   . Sjogren's syndrome ([place])    dry eyes  . Tenonitis   . Urinary incontinence    occ. an issue wears pads    Past Surgical History:  Procedure Laterality Date  . ABDOMINAL HYSTERECTOMY    . BACK SURGERY    . BREAST CYST ASPIRATION    . BREAST SURGERY Right    cyst drainage  . CARPAL TUNNEL RELEASE Right 2001  . SHOULDER SURGERY Right    right shoulder tendon and rotator cuff repair  . TOTAL HIP ARTHROPLASTY Right 2006  . TOTAL HIP ARTHROPLASTY Left 02/24/2016   Procedure: LEFT TOTAL HIP ARTHROPLASTY ANTERIOR APPROACH;  Surgeon: Mcarthur Rossetti, MD;  Location: [facility];  Service: Orthopedics;  Laterality: Left;    There were no vitals filed for this visit.      Subjective Assessment -  05/06/17 1022    Subjective "I got the injection and it really feels like I got my body back, I am still feeling alittle sore in the front of the hip but it is really doing better"    Currently in Pain? Yes   Pain Score 2    Pain Orientation Left   Pain Descriptors / Indicators Tightness   Pain Type Chronic pain            [facility] PT Assessment - 05/06/17 1027      Observation/Other Assessments   Focus on Therapeutic Outcomes (FOTO)  50% limited     AROM   Left Hip Extension --  nuetral   Left Hip Flexion 72   Lumbar Flexion 60   Lumbar Extension 30   Lumbar - Right Side Bend 22   Lumbar - Left Side Bend 23     Strength   Left Hip Flexion 3+/5  catching sensation   Left Hip Extension 4-/5  in available ROM   Left Hip External Rotation 4+/5   Left Hip Internal Rotation 4+/5   Left Hip ABduction 4-/5                     [facility] Adult PT Treatment/Exercise - 05/06/17 1023      Knee/Hip  Exercises: Stretches   Hip Flexor Stretch 30 seconds;5 reps  standing with glute set to add stretch     Knee/Hip Exercises: Aerobic   Nustep L6 x 8 min UE/LE     Knee/Hip Exercises: Standing   Hip Abduction 2 sets;Knee straight;Both   Hip Extension Stengthening;Both;2 sets;10 reps;Knee straight   Other Standing Knee Exercises marching in place in // 2 x 20   Other Standing Knee Exercises alternating toe taps on 8 inch step 3 x 10 in //     Knee/Hip Exercises: Seated   Long Arc Quad 2 sets;15 reps                PT Education - 05/06/17 1318    Education provided Yes   Education Details reviewed previously provided HEP and hip strengthening.    Person(s) Educated Patient   Methods Explanation;Verbal cues;Handout   Comprehension Verbalized understanding;Verbal cues required          PT Short Term Goals - 05/06/17 1314      PT SHORT TERM GOAL #1   Title pt to be I with initial HEP    Time 3   Period Weeks   Status Achieved     PT SHORT TERM GOAL #2    Title pt to verbalize / demo proper posture and lifting mechanics to prevent / reduce hip/ low back pain   Time 3   Period Weeks   Status Achieved     PT SHORT TERM GOAL #3   Title pt will report decreased LLE referral symptoms as intermittent to demonstrate improvement of condition   Period Weeks   Status Achieved     PT SHORT TERM GOAL #4   Title ambulate with increased hip extension and abduction due to increased tissue mobility   Time 4   Period Weeks   Status Partially Met           PT Long Term Goals - 05/06/17 1315      PT LONG TERM GOAL #1   Title pt to increase L hip flexion to >/= 90 degrees and extension and IR/ER by >/= 8 degrees with </= 2/10 pain for functional mobility required for ADLs and proper gait   Time 6   Period Weeks   Status On-going     PT LONG TERM GOAL #2   Title increase LLE strength to >/= 4/5 to promote hip / knee stability with walking / standing activities    Time 6   Period Weeks   Status On-going     PT LONG TERM GOAL #3   Title report no referral in the LLE for >/= 1 week and pain in the hip to </= 1/10 for improvement in condition    Time 6   Period Weeks   Status On-going     PT LONG TERM GOAL #4   Title pt to be able to sit/ stand and walk for >/= 60 min with </= 1/10 pain for community ambulation / ADLS and pt's personal goals of returning to exercise    Time 6   Period Weeks   Status On-going     PT LONG TERM GOAL #5   Title increase FOTO to </= 46% limited to demo improvement in condition   Time 6   Period Weeks   Status On-going               Plan - 05/06/17 1311    Clinical Impression Statement pt recieved an injection  under [place] in the low back since the previous session. Today she reports significant relief of pain and tightness, she does continue have pain / tightness in the L proximal hip flexor. she is improving with trunk and hip mobility as well as strength. Continued focus on standing strengthening  which she performed well. plan to have pt return for another visit is 2 weeks to assess her progress and if she is doing well then D/C.    PT Frequency 2x / week   PT Treatment/Interventions ADLs/Self Care Home Management;Cryotherapy;Electrical Stimulation;Iontophoresis 17m/ml Dexamethasone;Moist Heat;Ultrasound;Dry needling;Taping;Manual techniques;Therapeutic activities;Therapeutic exercise;Balance training;Neuromuscular re-education;Patient/family education;Passive range of motion;Gait training;Stair training   PT Next Visit Plan discuss DN, piriformis stretching and soft tissue work, hip strengthening/ gentle mobs, How was injection, repeated extension, standing exercise   PT Home Exercise Plan piriformis stretching, pelvic tilt (supine), sidelying hip abduction, hamstring stretch 90/90    Consulted and Agree with Plan of Care Patient      Patient will benefit from skilled therapeutic intervention in order to improve the following deficits and impairments:  Abnormal gait, Pain, Improper body mechanics, Postural dysfunction, Decreased strength, Decreased mobility, Decreased range of motion, Decreased endurance, Decreased activity tolerance, Decreased balance, Increased fascial restricitons, Increased muscle spasms, Difficulty walking, Hypomobility  Visit Diagnosis: Pain in left hip - Plan: PT plan of care cert/re-cert  Muscle weakness (generalized) - Plan: PT plan of care cert/re-cert  Other abnormalities of gait and mobility - Plan: PT plan of care cert/re-cert  Other muscle spasm - Plan: PT plan of care cert/re-cert       G-Codes - [id number]    Functional Assessment Tool Used (Outpatient Only) ROM, MMT, pain, FOTO   Functional Limitation Mobility: Walking and moving around   Mobility: Walking and Moving Around Current Status (([id number] At least 20 percent but less than 40 percent impaired, limited or restricted   Mobility: Walking and Moving Around Goal Status [phone] At least 20  percent but less than 40 percent impaired, limited or restricted      Problem List Patient Active Problem List   Diagnosis Date Noted  . Trochanteric bursitis, left hip 03/07/2017  . Lymphedema 12/06/2016  . Bilateral bunions 08/21/2016  . Seasonal allergies 07/06/2016  . Asthma 07/06/2016  . Sjogren's syndrome ([place] 07/06/2016  . Bilateral primary osteoarthritis of knee 07/06/2016  . Osteoarthritis of both hands 07/06/2016  . Myofacial muscle pain 07/06/2016  . DDD lumbar spine with spinal stenosis 07/06/2016  . Abnormality of gait 06/26/2016  . Chronic pain of right ankle 06/26/2016  . H/O bilateral hip replacements 02/24/2016  . Varicose veins 04/20/2015  . Lipidemia 04/20/2015  . Rheumatoid factor positive 11/20/2013  . Back pain with left-sided sciatica 11/20/2013  . HTN (hypertension) 11/20/2013  . GERD (gastroesophageal reflux disease) 11/20/2013  . Bladder spasm 11/20/2013  . Hyperlipidemia 11/20/2013  . Bilateral swelling of feet   . Bruises easily    KStarr LakePT, DPT, LAT, ATC  110-18-2018 1:21 PM      [facility]GBonif[place] [address]Phone: [phone]  Fax:  [phone] Name: Kathy TOURANGEAUMRN: [id number] of Birth: [birth year]

## 2017-05-08 ENCOUNTER — Other Ambulatory Visit (INDEPENDENT_AMBULATORY_CARE_PROVIDER_SITE_OTHER): Payer: Self-pay | Admitting: Orthopaedic Surgery

## 2017-05-08 ENCOUNTER — Telehealth (INDEPENDENT_AMBULATORY_CARE_PROVIDER_SITE_OTHER): Payer: Self-pay | Admitting: Orthopaedic Surgery

## 2017-05-08 MED ORDER — HYDROCODONE-ACETAMINOPHEN 5-325 MG PO TABS: 1.0000 | ORAL_TABLET | Freq: Two times a day (BID) | ORAL | 0 refills | Status: DC | PRN

## 2017-05-08 NOTE — Telephone Encounter (Signed)
Patient called asking for a refill on Hydrocodone. CB # [phone]

## 2017-05-08 NOTE — Telephone Encounter (Signed)
She can come and pick up another prescription for pain medicine and she needs to try to use these sparingly.

## 2017-05-08 NOTE — Telephone Encounter (Signed)
Please advise 

## 2017-05-08 NOTE — Telephone Encounter (Signed)
LMOM for patient Rx ready at front desk 

## 2017-05-13 DIAGNOSIS — M9906 Segmental and somatic dysfunction of lower extremity: Secondary | ICD-10-CM | POA: Diagnosis not present

## 2017-05-13 DIAGNOSIS — M7661 Achilles tendinitis, right leg: Secondary | ICD-10-CM | POA: Diagnosis not present

## 2017-05-13 DIAGNOSIS — M9905 Segmental and somatic dysfunction of pelvic region: Secondary | ICD-10-CM | POA: Diagnosis not present

## 2017-05-20 ENCOUNTER — Ambulatory Visit: Payer: Medicare PPO | Admitting: Physical Therapy

## 2017-05-20 ENCOUNTER — Encounter: Payer: Self-pay | Admitting: Physical Therapy

## 2017-05-20 DIAGNOSIS — M62838 Other muscle spasm: Secondary | ICD-10-CM

## 2017-05-20 DIAGNOSIS — R2689 Other abnormalities of gait and mobility: Secondary | ICD-10-CM

## 2017-05-20 DIAGNOSIS — M25552 Pain in left hip: Secondary | ICD-10-CM | POA: Diagnosis not present

## 2017-05-20 DIAGNOSIS — M6281 Muscle weakness (generalized): Secondary | ICD-10-CM

## 2017-05-20 NOTE — Patient Instructions (Signed)

## 2017-05-20 NOTE — Therapy (Signed)
[place], [place], [address] Phone: [phone]   Fax:  [phone]  Physical Therapy Treatment /  Discharge Summary  Patient Details  Name: Kathy Duran MRN: [id number] Date of Birth: [birth year] Referring Provider: Zollie Beckers Md  Encounter Date: 05/20/2017      PT End of Session - 05/20/17 1208    Visit Number 9   Number of Visits 13   Date for PT Re-Evaluation 06/17/17   Authorization Type MCR: Kx mod by 15th visit, Progress note by 18th visit   PT Start Time 1150   PT Stop Time 1228   PT Time Calculation (min) 38 min   Activity Tolerance Patient tolerated treatment well   Behavior During Therapy [facility] for tasks assessed/performed      Past Medical History:  Diagnosis Date  . Anxiety   . Arthritis    left hip  . Asthma    Inhalers for tx  . Bilateral primary osteoarthritis of knee 07/06/2016   Moderate  . Bilateral swelling of feet    bilateral ankle and feet edema  . Bruises easily   . DDD (degenerative disc disease), lumbar 07/06/2016  . Hypertension   . Myofacial muscle pain 07/06/2016  . Osteoarthritis of both hands 07/06/2016  . Seasonal allergies 07/06/2016  . Sinus complaint   . Sjogren's syndrome ([place])    dry eyes  . Tenonitis   . Urinary incontinence    occ. an issue wears pads    Past Surgical History:  Procedure Laterality Date  . ABDOMINAL HYSTERECTOMY    . BACK SURGERY    . BREAST CYST ASPIRATION    . BREAST SURGERY Right    cyst drainage  . CARPAL TUNNEL RELEASE Right 2001  . SHOULDER SURGERY Right    right shoulder tendon and rotator cuff repair  . TOTAL HIP ARTHROPLASTY Right 2006  . TOTAL HIP ARTHROPLASTY Left 02/24/2016   Procedure: LEFT TOTAL HIP ARTHROPLASTY ANTERIOR APPROACH;  Surgeon: Mcarthur Rossetti, MD;  Location: [facility];  Service: Orthopedics;  Laterality: Left;    There were no vitals filed for this visit.      Subjective Assessment -  05/20/17 1146    Subjective "I am having the most pain free days, I have to pinch myself. I am pretty much back to the regular routine, biggest issue is not lifting with my back"    Currently in Pain? No/denies            [facility] PT Assessment - 05/20/17 1200      AROM   Left Hip Extension 11   Left Hip Flexion 105     Strength   Left Hip Flexion 3+/5  pain during testing   Left Hip Extension 3+/5   Left Hip ABduction 4/5   Left Hip ADduction 4+/5                     [facility] Adult PT Treatment/Exercise - 05/20/17 1236      Self-Care   Self-Care Posture   Posture proper form with lifting and carrying activities  handout provided     Knee/Hip Exercises: Stretches   Hip Flexor Stretch 2 reps;30 seconds                PT Education - 05/20/17 1230    Education provided Yes   Education Details reviewed previously provided HEP. benefits of continued exercise to continue to improve function and how to progress  with reps/ sets and resistance. Posture education / Management consultant) Educated Patient   Methods Explanation;Verbal cues;Handout   Comprehension Verbalized understanding;Verbal cues required          PT Short Term Goals - 05/20/17 1223      PT SHORT TERM GOAL #1   Title pt to be I with initial HEP    Time 3   Period Weeks   Status Achieved     PT SHORT TERM GOAL #2   Title pt to verbalize / demo proper posture and lifting mechanics to prevent / reduce hip/ low back pain   Time 3   Period Weeks   Status Achieved     PT SHORT TERM GOAL #3   Title pt will report decreased LLE referral symptoms as intermittent to demonstrate improvement of condition   Time 3   Period Weeks   Status Achieved     PT SHORT TERM GOAL #4   Title ambulate with increased hip extension and abduction due to increased tissue mobility   Time 4   Period Weeks   Status Achieved           PT Long Term Goals - 05/20/17 1224      PT LONG TERM GOAL  #1   Title pt to increase L hip flexion to >/= 90 degrees and extension and IR/ER by >/= 8 degrees with </= 2/10 pain for functional mobility required for ADLs and proper gait   Time 6   Period Weeks   Status Achieved     PT LONG TERM GOAL #2   Title increase LLE strength to >/= 4/5 to promote hip / knee stability with walking / standing activities    Time 6   Period Weeks   Status Partially Met     PT LONG TERM GOAL #3   Title report no referral in the LLE for >/= 1 week and pain in the hip to </= 1/10 for improvement in condition    Time 6   Period Weeks   Status Achieved     PT LONG TERM GOAL #4   Title pt to be able to sit/ stand and walk for >/= 60 min with </= 1/10 pain for community ambulation / ADLS and pt's personal goals of returning to exercise    Time 6   Period Weeks   Status Achieved     PT LONG TERM GOAL #5   Title increase FOTO to </= 46% limited to demo improvement in condition   Time 6   Period Weeks   Status Partially Met               Plan - 05/20/17 1231    Clinical Impression Statement Mrs. Lalley reports continued improvement of the hip and low back since last session. she improved hip mobility in all planes and additionally is pain free. she continues to demo limited hip extensor strength, and soreness with resisted Hip flexion on the L. reviewed HEP and progression, she met or partially met all goals and reports she is able to maintain and progress her current level of function independently.    PT Next Visit Plan D/C   PT Home Exercise Plan piriformis stretching, pelvic tilt (supine), sidelying hip abduction, hamstring stretch 90/90    Consulted and Agree with Plan of Care Patient      Patient will benefit from skilled therapeutic intervention in order to improve the following deficits and impairments:  Abnormal  gait, Pain, Improper body mechanics, Postural dysfunction, Decreased strength, Decreased mobility, Decreased range of motion,  Decreased endurance, Decreased activity tolerance, Decreased balance, Increased fascial restricitons, Increased muscle spasms, Difficulty walking, Hypomobility  Visit Diagnosis: Pain in left hip  Muscle weakness (generalized)  Other abnormalities of gait and mobility  Other muscle spasm       G-Codes - May 23, 2017 1234    Functional Assessment Tool Used (Outpatient Only) ROM, MMT, pain, FOTO   Functional Limitation Mobility: Walking and moving around   Mobility: Walking and Moving Around Goal Status [phone]) At least 20 percent but less than 40 percent impaired, limited or restricted   Mobility: Walking and Moving Around Discharge Status [phone]) At least 20 percent but less than 40 percent impaired, limited or restricted      Problem List Patient Active Problem List   Diagnosis Date Noted  . Trochanteric bursitis, left hip 03/07/2017  . Lymphedema 12/06/2016  . Bilateral bunions 08/21/2016  . Seasonal allergies 07/06/2016  . Asthma 07/06/2016  . Sjogren's syndrome ([place]) 07/06/2016  . Bilateral primary osteoarthritis of knee 07/06/2016  . Osteoarthritis of both hands 07/06/2016  . Myofacial muscle pain 07/06/2016  . DDD lumbar spine with spinal stenosis 07/06/2016  . Abnormality of gait 06/26/2016  . Chronic pain of right ankle 06/26/2016  . H/O bilateral hip replacements 02/24/2016  . Varicose veins 04/20/2015  . Lipidemia 04/20/2015  . Rheumatoid factor positive 11/20/2013  . Back pain with left-sided sciatica 11/20/2013  . HTN (hypertension) 11/20/2013  . GERD (gastroesophageal reflux disease) 11/20/2013  . Bladder spasm 11/20/2013  . Hyperlipidemia 11/20/2013  . Bilateral swelling of feet   . Bruises easily    Starr Lake PT, DPT, LAT, ATC  May 23, 2017  12:38 PM      [facility] [address] [place], [place], [address] Phone: [phone]   Fax:  [phone]  Name: Kathy Duran MRN: [id number] Date of  Birth: 1945/08/22

## 2017-05-27 ENCOUNTER — Encounter: Payer: Medicare PPO | Admitting: Physical Therapy

## 2017-05-30 ENCOUNTER — Other Ambulatory Visit (INDEPENDENT_AMBULATORY_CARE_PROVIDER_SITE_OTHER): Payer: Self-pay | Admitting: Orthopaedic Surgery

## 2017-05-30 NOTE — Telephone Encounter (Signed)
Please advise 

## 2017-06-03 ENCOUNTER — Encounter: Payer: Medicare PPO | Admitting: Physical Therapy

## 2017-06-05 ENCOUNTER — Telehealth (INDEPENDENT_AMBULATORY_CARE_PROVIDER_SITE_OTHER): Payer: Self-pay | Admitting: Orthopaedic Surgery

## 2017-06-05 NOTE — Telephone Encounter (Signed)
Patient called asking for a renewal on her handicap placard. CB #  [phone]

## 2017-06-06 NOTE — Telephone Encounter (Signed)
LMOM for patient letting her know I will have that handicap ready for her after Magnus Ivan gets here from surgery

## 2017-06-10 DIAGNOSIS — K116 Mucocele of salivary gland: Secondary | ICD-10-CM | POA: Diagnosis not present

## 2017-07-18 DIAGNOSIS — M169 Osteoarthritis of hip, unspecified: Secondary | ICD-10-CM | POA: Diagnosis not present

## 2017-07-18 DIAGNOSIS — J45909 Unspecified asthma, uncomplicated: Secondary | ICD-10-CM | POA: Diagnosis not present

## 2017-07-18 DIAGNOSIS — I10 Essential (primary) hypertension: Secondary | ICD-10-CM | POA: Diagnosis not present

## 2017-07-18 DIAGNOSIS — E782 Mixed hyperlipidemia: Secondary | ICD-10-CM | POA: Diagnosis not present

## 2017-07-18 DIAGNOSIS — M179 Osteoarthritis of knee, unspecified: Secondary | ICD-10-CM | POA: Diagnosis not present

## 2017-07-18 DIAGNOSIS — F341 Dysthymic disorder: Secondary | ICD-10-CM | POA: Diagnosis not present

## 2017-07-25 ENCOUNTER — Telehealth (INDEPENDENT_AMBULATORY_CARE_PROVIDER_SITE_OTHER): Payer: Self-pay | Admitting: Orthopaedic Surgery

## 2017-07-25 ENCOUNTER — Other Ambulatory Visit (INDEPENDENT_AMBULATORY_CARE_PROVIDER_SITE_OTHER): Payer: Self-pay | Admitting: Orthopaedic Surgery

## 2017-07-25 MED ORDER — HYDROCODONE-ACETAMINOPHEN 5-325 MG PO TABS: 1.0000 | ORAL_TABLET | Freq: Two times a day (BID) | ORAL | 0 refills | Status: DC | PRN

## 2017-07-25 NOTE — Telephone Encounter (Signed)
Can come and pick up a script 

## 2017-07-25 NOTE — Telephone Encounter (Signed)
Patient called needing Rx refilled (Hydrocodone) The number to contact patient is [phone]

## 2017-07-25 NOTE — Telephone Encounter (Signed)
Please advise 

## 2017-07-25 NOTE — Telephone Encounter (Signed)
LMOM for patient of the below message  

## 2017-07-26 DIAGNOSIS — I10 Essential (primary) hypertension: Secondary | ICD-10-CM | POA: Diagnosis not present

## 2017-07-26 DIAGNOSIS — M169 Osteoarthritis of hip, unspecified: Secondary | ICD-10-CM | POA: Diagnosis not present

## 2017-07-26 DIAGNOSIS — E782 Mixed hyperlipidemia: Secondary | ICD-10-CM | POA: Diagnosis not present

## 2017-07-26 DIAGNOSIS — J45909 Unspecified asthma, uncomplicated: Secondary | ICD-10-CM | POA: Diagnosis not present

## 2017-07-26 DIAGNOSIS — M179 Osteoarthritis of knee, unspecified: Secondary | ICD-10-CM | POA: Diagnosis not present

## 2017-07-26 DIAGNOSIS — F341 Dysthymic disorder: Secondary | ICD-10-CM | POA: Diagnosis not present

## 2017-07-31 DIAGNOSIS — J069 Acute upper respiratory infection, unspecified: Secondary | ICD-10-CM | POA: Diagnosis not present

## 2017-07-31 DIAGNOSIS — J45909 Unspecified asthma, uncomplicated: Secondary | ICD-10-CM | POA: Diagnosis not present

## 2017-08-01 ENCOUNTER — Other Ambulatory Visit: Payer: Self-pay | Admitting: Internal Medicine

## 2017-08-14 DIAGNOSIS — K110 Atrophy of salivary gland: Secondary | ICD-10-CM | POA: Diagnosis not present

## 2017-08-14 DIAGNOSIS — K136 Irritative hyperplasia of oral mucosa: Secondary | ICD-10-CM | POA: Diagnosis not present

## 2017-08-19 DIAGNOSIS — K1123 Chronic sialoadenitis: Secondary | ICD-10-CM | POA: Diagnosis not present

## 2017-08-19 DIAGNOSIS — K116 Mucocele of salivary gland: Secondary | ICD-10-CM | POA: Diagnosis not present

## 2017-08-19 DIAGNOSIS — K136 Irritative hyperplasia of oral mucosa: Secondary | ICD-10-CM | POA: Diagnosis not present

## 2017-09-03 DIAGNOSIS — J45909 Unspecified asthma, uncomplicated: Secondary | ICD-10-CM | POA: Diagnosis not present

## 2017-09-03 DIAGNOSIS — J309 Allergic rhinitis, unspecified: Secondary | ICD-10-CM | POA: Diagnosis not present

## 2017-09-03 DIAGNOSIS — R7309 Other abnormal glucose: Secondary | ICD-10-CM | POA: Diagnosis not present

## 2017-09-03 DIAGNOSIS — Z7189 Other specified counseling: Secondary | ICD-10-CM | POA: Diagnosis not present

## 2017-09-03 DIAGNOSIS — Z23 Encounter for immunization: Secondary | ICD-10-CM | POA: Diagnosis not present

## 2017-09-03 DIAGNOSIS — M3500 Sicca syndrome, unspecified: Secondary | ICD-10-CM | POA: Diagnosis not present

## 2017-09-03 DIAGNOSIS — I10 Essential (primary) hypertension: Secondary | ICD-10-CM | POA: Diagnosis not present

## 2017-09-03 DIAGNOSIS — M169 Osteoarthritis of hip, unspecified: Secondary | ICD-10-CM | POA: Diagnosis not present

## 2017-09-03 DIAGNOSIS — E782 Mixed hyperlipidemia: Secondary | ICD-10-CM | POA: Diagnosis not present

## 2017-09-03 DIAGNOSIS — E559 Vitamin D deficiency, unspecified: Secondary | ICD-10-CM | POA: Diagnosis not present

## 2017-09-03 DIAGNOSIS — M179 Osteoarthritis of knee, unspecified: Secondary | ICD-10-CM | POA: Diagnosis not present

## 2017-09-03 DIAGNOSIS — G4700 Insomnia, unspecified: Secondary | ICD-10-CM | POA: Diagnosis not present

## 2017-09-03 DIAGNOSIS — Z0000 Encounter for general adult medical examination without abnormal findings: Secondary | ICD-10-CM | POA: Diagnosis not present

## 2017-09-03 DIAGNOSIS — Z1389 Encounter for screening for other disorder: Secondary | ICD-10-CM | POA: Diagnosis not present

## 2017-10-11 ENCOUNTER — Telehealth (INDEPENDENT_AMBULATORY_CARE_PROVIDER_SITE_OTHER): Payer: Self-pay | Admitting: Orthopaedic Surgery

## 2017-10-11 NOTE — Telephone Encounter (Signed)
Per Dr. Ophelia Charter, hold for [facility]

## 2017-10-11 NOTE — Telephone Encounter (Signed)
Patient requesting RX refill on hydrocodone.

## 2017-10-16 NOTE — Telephone Encounter (Signed)
Please advise 

## 2017-10-16 NOTE — Telephone Encounter (Signed)
Med refill  °Hydrocodone  ° °

## 2017-10-16 NOTE — Telephone Encounter (Signed)
I can not send in any hydrocodone at this standpoint due to narcotic restrictions/laws.  It will be almost 6 months next week since she has been seen.

## 2017-10-17 NOTE — Telephone Encounter (Signed)
Called patient and advised on message below.

## 2017-11-23 DIAGNOSIS — R1013 Epigastric pain: Secondary | ICD-10-CM | POA: Diagnosis not present

## 2017-11-23 DIAGNOSIS — R110 Nausea: Secondary | ICD-10-CM | POA: Diagnosis not present

## 2017-11-23 DIAGNOSIS — K219 Gastro-esophageal reflux disease without esophagitis: Secondary | ICD-10-CM | POA: Diagnosis not present

## 2017-11-25 ENCOUNTER — Other Ambulatory Visit: Payer: Self-pay | Admitting: Internal Medicine

## 2017-11-25 ENCOUNTER — Ambulatory Visit
Admission: RE | Admit: 2017-11-25 | Discharge: 2017-11-25 | Disposition: A | Discharge status: Home / Self Care | Payer: Medicare PPO | Source: Ambulatory Visit | Attending: Internal Medicine | Admitting: Internal Medicine

## 2017-11-25 DIAGNOSIS — R140 Abdominal distension (gaseous): Secondary | ICD-10-CM | POA: Diagnosis not present

## 2017-11-25 DIAGNOSIS — R1084 Generalized abdominal pain: Secondary | ICD-10-CM

## 2017-11-25 DIAGNOSIS — K5901 Slow transit constipation: Secondary | ICD-10-CM | POA: Diagnosis not present

## 2017-11-27 ENCOUNTER — Ambulatory Visit (INDEPENDENT_AMBULATORY_CARE_PROVIDER_SITE_OTHER): Payer: Medicare PPO | Admitting: Orthopaedic Surgery

## 2017-11-27 ENCOUNTER — Encounter (INDEPENDENT_AMBULATORY_CARE_PROVIDER_SITE_OTHER): Payer: Self-pay | Admitting: Orthopaedic Surgery

## 2017-11-27 DIAGNOSIS — M7062 Trochanteric bursitis, left hip: Secondary | ICD-10-CM

## 2017-11-27 DIAGNOSIS — G8929 Other chronic pain: Secondary | ICD-10-CM | POA: Diagnosis not present

## 2017-11-27 DIAGNOSIS — M5442 Lumbago with sciatica, left side: Secondary | ICD-10-CM | POA: Diagnosis not present

## 2017-11-27 DIAGNOSIS — R109 Unspecified abdominal pain: Secondary | ICD-10-CM | POA: Diagnosis not present

## 2017-11-27 MED ORDER — METHYLPREDNISOLONE ACETATE 40 MG/ML IJ SUSP
40.0000 mg | INTRAMUSCULAR | Status: AC | PRN
  Administered 2017-11-27: 40 mg via INTRA_ARTICULAR

## 2017-11-27 MED ORDER — LIDOCAINE HCL 1 % IJ SOLN
3.0000 mL | INTRAMUSCULAR | Status: AC | PRN
  Administered 2017-11-27: 3 mL

## 2017-11-27 NOTE — Progress Notes (Signed)
Office Visit Note   Patient: Kathy Duran           Date of Birth: 05-11-1946           MRN: [id number] Visit Date: 11/27/2017              Requested by: Kathy Housekeeper, MD [address] [place], [place] [address] PCP: Kathy Housekeeper, MD   Assessment & Plan: Visit Diagnoses:  1. Trochanteric bursitis, left hip   2. Chronic left-sided low back pain with left-sided sciatica     Plan: She will perform IT band stretching exercises as shown today.  We will send her back to [facility] imaging for lumbar epidural steroid injection.  See her back here in 4 weeks check her progress lack of.  Questions encouraged and answered at length.  Follow-Up Instructions: Return in about 1 month (around 12/25/2017).   Orders:  Orders Placed This Encounter  Procedures  . Large Joint Inj: L greater trochanter   No orders of the defined types were placed in this encounter.     Procedures: Large Joint Inj: L greater trochanter on 11/27/2017 10:24 AM Indications: pain Details: 22 G 1.5 in needle, lateral approach  Arthrogram: No  Medications: 3 mL lidocaine 1 %; 40 mg methylPREDNISolone acetate 40 MG/ML Outcome: tolerated well, no immediate complications Procedure, treatment alternatives, risks and benefits explained, specific risks discussed. Consent was given by the patient. Immediately prior to procedure a time out was called to verify the correct patient, procedure, equipment, support staff and site/side marked as required. Patient was prepped and draped in the usual sterile fashion.       Clinical Data: No additional findings.   Subjective: Chief Complaint  Patient presents with  . Lower Back - Follow-up, Pain    S/p ESI at [facility] 05/02/18    HPI Kathy Duran returns today due to some low back pain.  She has previously been seen and underwent epidural steroid injection due to her low back pain with sciatic symptoms down the left leg last October.  Knees injections  did not help.  Unfortunately whenever she went back to the gym this April and did some work on a stationary bike treadmill and some light weight she developed pain in her low back and into both hips left greater than right.  She stopped the exercises and her pain has improved but still having some low back pain and pain into both hips.  She is describes no numbness tingling down either leg.  Previous MRI showed broad-based disc bulge at L3-4 with stenosis both lateral recesses left greater than right with probable neural compression: L4-5 disc protrusion with left foraminal extension and L5-S1 compression left S1 nerve due to left posterior lateral disc herniation.  She does have some urinary incontinence but this is unchanged.  No bowel incontinence.  She is tried heat cold to her low back.  Review of Systems Denies any recent fevers, chills, chest pain, nausea or vomiting.  Objective: Vital Signs: There were no vitals taken for this visit.  Physical Exam  Constitutional: She is oriented to person, place, and time. She appears well-developed and well-nourished. No distress.  Cardiovascular: Intact distal pulses.  Pulmonary/Chest: Effort normal.  Neurological: She is alert and oriented to person, place, and time.  Skin: She is not diaphoretic.  Psychiatric: She has a normal mood and affect.    Ortho Exam Lower extremity she has 5 out of 5 strength throughout the lower  extremities against resistance.  Positive straight leg raise on the left negative on the right.  Tenderness over the greater trochanteric region left greater than right.  Good range of motion of both hips without significant pain.  Calf supple nontender.  Sensation grossly intact bilateral feet light touch* Specialty Comments:  No specialty comments available.  Imaging: No results found.   PMFS History: Patient Active Problem List   Diagnosis Date Noted  . Trochanteric bursitis, left hip 03/07/2017  . Lymphedema 12/06/2016    . Bilateral bunions 08/21/2016  . Seasonal allergies 07/06/2016  . Asthma 07/06/2016  . Sjogren's syndrome (HCC) 07/06/2016  . Bilateral primary osteoarthritis of knee 07/06/2016  . Osteoarthritis of both hands 07/06/2016  . Myofacial muscle pain 07/06/2016  . DDD lumbar spine with spinal stenosis 07/06/2016  . Abnormality of gait 06/26/2016  . Chronic pain of right ankle 06/26/2016  . H/O bilateral hip replacements 02/24/2016  . Varicose veins 04/20/2015  . Lipidemia 04/20/2015  . Rheumatoid factor positive 11/20/2013  . Back pain with left-sided sciatica 11/20/2013  . HTN (hypertension) 11/20/2013  . GERD (gastroesophageal reflux disease) 11/20/2013  . Bladder spasm 11/20/2013  . Hyperlipidemia 11/20/2013  . Bilateral swelling of feet   . Bruises easily    Past Medical History:  Diagnosis Date  . Anxiety   . Arthritis    left hip  . Asthma    Inhalers for tx  . Bilateral primary osteoarthritis of knee 07/06/2016   Moderate  . Bilateral swelling of feet    bilateral ankle and feet edema  . Bruises easily   . DDD (degenerative disc disease), lumbar 07/06/2016  . Hypertension   . Myofacial muscle pain 07/06/2016  . Osteoarthritis of both hands 07/06/2016  . Seasonal allergies 07/06/2016  . Sinus complaint   . Sjogren's syndrome (HCC)    dry eyes  . Tenonitis   . Urinary incontinence    occ. an issue wears pads    Family History  Problem Relation Age of Onset  . Arrhythmia Mother   . Stroke Mother   . Hyperlipidemia Mother   . Diabetes Mother   . Hypertension Mother   . Heart attack Father   . Diabetes Father   . Cancer Father   . Hypertension Father   . Hyperlipidemia Maternal Grandmother   . Hypertension Maternal Grandmother   . Heart attack Paternal Grandfather   . Stroke Paternal Grandfather   . Asthma Brother   . Emphysema Brother   . Hypertension Brother   . Diabetes Sister   . Hypertension Sister   . Hyperlipidemia Sister     Past Surgical  History:  Procedure Laterality Date  . ABDOMINAL HYSTERECTOMY    . BACK SURGERY    . BREAST CYST ASPIRATION    . BREAST SURGERY Right    cyst drainage  . CARPAL TUNNEL RELEASE Right 2001  . SHOULDER SURGERY Right    right shoulder tendon and rotator cuff repair  . TOTAL HIP ARTHROPLASTY Right 2006  . TOTAL HIP ARTHROPLASTY Left 02/24/2016   Procedure: LEFT TOTAL HIP ARTHROPLASTY ANTERIOR APPROACH;  Surgeon: Kathryne Hitch, MD;  Location: [facility];  Service: Orthopedics;  Laterality: Left;   Social History   Occupational History  . Not on file  Tobacco Use  . Smoking status: Former Smoker    Last attempt to quit: 11/21/1983    Years since quitting: 34.0  . Smokeless tobacco: Never Used  Substance and Sexual Activity  . Alcohol use: No  .  Drug use: No  . Sexual activity: Yes

## 2017-11-27 NOTE — Addendum Note (Signed)
Addended by: Rip Harbour on: 11/27/2017 11:10 AM   Modules accepted: Orders

## 2017-11-29 DIAGNOSIS — R109 Unspecified abdominal pain: Secondary | ICD-10-CM | POA: Diagnosis not present

## 2017-12-05 ENCOUNTER — Other Ambulatory Visit: Payer: Medicare PPO

## 2017-12-12 DIAGNOSIS — R109 Unspecified abdominal pain: Secondary | ICD-10-CM | POA: Diagnosis not present

## 2017-12-12 DIAGNOSIS — K648 Other hemorrhoids: Secondary | ICD-10-CM | POA: Diagnosis not present

## 2017-12-12 DIAGNOSIS — K635 Polyp of colon: Secondary | ICD-10-CM | POA: Diagnosis not present

## 2017-12-12 DIAGNOSIS — Z1211 Encounter for screening for malignant neoplasm of colon: Secondary | ICD-10-CM | POA: Diagnosis not present

## 2017-12-12 DIAGNOSIS — D125 Benign neoplasm of sigmoid colon: Secondary | ICD-10-CM | POA: Diagnosis not present

## 2017-12-17 ENCOUNTER — Other Ambulatory Visit (INDEPENDENT_AMBULATORY_CARE_PROVIDER_SITE_OTHER): Payer: Self-pay | Admitting: Physician Assistant

## 2017-12-17 DIAGNOSIS — G8929 Other chronic pain: Secondary | ICD-10-CM

## 2017-12-17 DIAGNOSIS — M545 Low back pain: Principal | ICD-10-CM

## 2017-12-25 ENCOUNTER — Ambulatory Visit (INDEPENDENT_AMBULATORY_CARE_PROVIDER_SITE_OTHER): Payer: Medicare PPO | Admitting: Orthopaedic Surgery

## 2017-12-27 ENCOUNTER — Ambulatory Visit
Admission: RE | Admit: 2017-12-27 | Discharge: 2017-12-27 | Disposition: A | Discharge status: Home / Self Care | Payer: Medicare PPO | Source: Ambulatory Visit | Attending: Physician Assistant | Admitting: Physician Assistant

## 2017-12-27 DIAGNOSIS — G8929 Other chronic pain: Secondary | ICD-10-CM

## 2017-12-27 DIAGNOSIS — M545 Low back pain: Principal | ICD-10-CM

## 2017-12-27 DIAGNOSIS — M5116 Intervertebral disc disorders with radiculopathy, lumbar region: Secondary | ICD-10-CM | POA: Diagnosis not present

## 2017-12-27 MED ORDER — IOPAMIDOL (ISOVUE-M 200) INJECTION 41%
1.0000 mL | Freq: Once | INTRAMUSCULAR | Status: AC
  Administered 2017-12-27: 1 mL via EPIDURAL

## 2017-12-27 MED ORDER — METHYLPREDNISOLONE ACETATE 40 MG/ML INJ SUSP (RADIOLOG
120.0000 mg | Freq: Once | INTRAMUSCULAR | Status: AC
  Administered 2017-12-27: 120 mg via EPIDURAL

## 2017-12-27 NOTE — Discharge Instructions (Signed)

## 2017-12-31 ENCOUNTER — Ambulatory Visit (INDEPENDENT_AMBULATORY_CARE_PROVIDER_SITE_OTHER): Payer: Medicare PPO | Admitting: Physician Assistant

## 2017-12-31 ENCOUNTER — Encounter (INDEPENDENT_AMBULATORY_CARE_PROVIDER_SITE_OTHER): Payer: Self-pay | Admitting: Physician Assistant

## 2017-12-31 DIAGNOSIS — M7062 Trochanteric bursitis, left hip: Secondary | ICD-10-CM | POA: Diagnosis not present

## 2017-12-31 DIAGNOSIS — G8929 Other chronic pain: Secondary | ICD-10-CM

## 2017-12-31 DIAGNOSIS — M5442 Lumbago with sciatica, left side: Secondary | ICD-10-CM

## 2017-12-31 MED ORDER — CYCLOBENZAPRINE HCL 10 MG PO TABS: 10.0000 mg | ORAL_TABLET | Freq: Every day | ORAL | 1 refills | Status: DC

## 2017-12-31 NOTE — Progress Notes (Signed)
Office Visit Note   Patient: Kathy Duran           Date of Birth: 1945-11-19           MRN: [id number] Visit Date: 12/31/2017              Requested by: Georgann Housekeeper, MD [address] [place], [place] [address] PCP: Georgann Housekeeper, MD   Assessment & Plan: Visit Diagnoses:  1. Chronic left-sided low back pain with left-sided sciatica   2. Trochanteric bursitis, left hip     Plan: Handout for back exercises was given.  Discussed IT band stretching exercises with her.  She will follow-up with [place] on an as-needed basis pain persist or becomes worse.  Questions were encouraged and answered.  Follow-Up Instructions: Return if symptoms worsen or fail to improve.   Orders:  No orders of the defined types were placed in this encounter.  Meds ordered this encounter  Medications  . cyclobenzaprine (FLEXERIL) 10 MG tablet    Sig: Take 1 tablet (10 mg total) by mouth at bedtime.    Dispense:  40 tablet    Refill:  1      Procedures: No procedures performed   Clinical Data: No additional findings.   Subjective: Chief Complaint  Patient presents with  . Left Hip - Follow-up    HPI Mrs. Kathy Duran returns today follow-up status post left hip trochanteric injection 11/27/2017.  She had a epidural lumbar injection left L3-4 on 12/27/2017.  She states that the injections were very helpful.  She is doing very well and is having no pain discomfort. Review of Systems Please see HPI otherwise negative  Objective: Vital Signs: There were no vitals taken for this visit.  Physical Exam  Constitutional: She is oriented to person, place, and time. She appears well-developed and well-nourished. No distress.  Pulmonary/Chest: Effort normal.  Neurological: She is alert and oriented to person, place, and time.  Skin: She is not diaphoretic.  Psychiatric: She has a normal mood and affect.    Ortho Exam Left hip mild tenderness over the trochanteric region.  Otherwise good  range of motion of both hips without pain negative straight leg raise bilaterally. Specialty Comments:  No specialty comments available.  Imaging: No results found.   PMFS History: Patient Active Problem List   Diagnosis Date Noted  . Trochanteric bursitis, left hip 03/07/2017  . Lymphedema 12/06/2016  . Bilateral bunions 08/21/2016  . Seasonal allergies 07/06/2016  . Asthma 07/06/2016  . Sjogren's syndrome (HCC) 07/06/2016  . Bilateral primary osteoarthritis of knee 07/06/2016  . Osteoarthritis of both hands 07/06/2016  . Myofacial muscle pain 07/06/2016  . DDD lumbar spine with spinal stenosis 07/06/2016  . Abnormality of gait 06/26/2016  . Chronic pain of right ankle 06/26/2016  . H/O bilateral hip replacements 02/24/2016  . Varicose veins 04/20/2015  . Lipidemia 04/20/2015  . Rheumatoid factor positive 11/20/2013  . Back pain with left-sided sciatica 11/20/2013  . HTN (hypertension) 11/20/2013  . GERD (gastroesophageal reflux disease) 11/20/2013  . Bladder spasm 11/20/2013  . Hyperlipidemia 11/20/2013  . Bilateral swelling of feet   . Bruises easily    Past Medical History:  Diagnosis Date  . Anxiety   . Arthritis    left hip  . Asthma    Inhalers for tx  . Bilateral primary osteoarthritis of knee 07/06/2016   Moderate  . Bilateral swelling of feet    bilateral ankle and feet edema  .  Bruises easily   . DDD (degenerative disc disease), lumbar 07/06/2016  . Hypertension   . Myofacial muscle pain 07/06/2016  . Osteoarthritis of both hands 07/06/2016  . Seasonal allergies 07/06/2016  . Sinus complaint   . Sjogren's syndrome (HCC)    dry eyes  . Tenonitis   . Urinary incontinence    occ. an issue wears pads    Family History  Problem Relation Age of Onset  . Arrhythmia Mother   . Stroke Mother   . Hyperlipidemia Mother   . Diabetes Mother   . Hypertension Mother   . Heart attack Father   . Diabetes Father   . Cancer Father   . Hypertension Father     . Hyperlipidemia Maternal Grandmother   . Hypertension Maternal Grandmother   . Heart attack Paternal Grandfather   . Stroke Paternal Grandfather   . Asthma Brother   . Emphysema Brother   . Hypertension Brother   . Diabetes Sister   . Hypertension Sister   . Hyperlipidemia Sister     Past Surgical History:  Procedure Laterality Date  . ABDOMINAL HYSTERECTOMY    . BACK SURGERY    . BREAST CYST ASPIRATION    . BREAST SURGERY Right    cyst drainage  . CARPAL TUNNEL RELEASE Right 2001  . SHOULDER SURGERY Right    right shoulder tendon and rotator cuff repair  . TOTAL HIP ARTHROPLASTY Right 2006  . TOTAL HIP ARTHROPLASTY Left 02/24/2016   Procedure: LEFT TOTAL HIP ARTHROPLASTY ANTERIOR APPROACH;  Surgeon: Kathryne Hitch, MD;  Location: [facility];  Service: Orthopedics;  Laterality: Left;   Social History   Occupational History  . Not on file  Tobacco Use  . Smoking status: Former Smoker    Last attempt to quit: 11/21/1983    Years since quitting: 34.1  . Smokeless tobacco: Never Used  Substance and Sexual Activity  . Alcohol use: No  . Drug use: No  . Sexual activity: Yes

## 2018-01-01 DIAGNOSIS — K648 Other hemorrhoids: Secondary | ICD-10-CM | POA: Diagnosis not present

## 2018-01-29 DIAGNOSIS — K219 Gastro-esophageal reflux disease without esophagitis: Secondary | ICD-10-CM | POA: Diagnosis not present

## 2018-01-29 DIAGNOSIS — K648 Other hemorrhoids: Secondary | ICD-10-CM | POA: Diagnosis not present

## 2018-02-24 ENCOUNTER — Other Ambulatory Visit: Payer: Self-pay | Admitting: Internal Medicine

## 2018-02-24 DIAGNOSIS — Z1231 Encounter for screening mammogram for malignant neoplasm of breast: Secondary | ICD-10-CM

## 2018-03-05 DIAGNOSIS — K648 Other hemorrhoids: Secondary | ICD-10-CM | POA: Diagnosis not present

## 2018-03-11 DIAGNOSIS — I10 Essential (primary) hypertension: Secondary | ICD-10-CM | POA: Diagnosis not present

## 2018-03-11 DIAGNOSIS — E782 Mixed hyperlipidemia: Secondary | ICD-10-CM | POA: Diagnosis not present

## 2018-03-11 DIAGNOSIS — J309 Allergic rhinitis, unspecified: Secondary | ICD-10-CM | POA: Diagnosis not present

## 2018-03-11 DIAGNOSIS — K219 Gastro-esophageal reflux disease without esophagitis: Secondary | ICD-10-CM | POA: Diagnosis not present

## 2018-03-11 DIAGNOSIS — M3500 Sicca syndrome, unspecified: Secondary | ICD-10-CM | POA: Diagnosis not present

## 2018-03-11 DIAGNOSIS — J45909 Unspecified asthma, uncomplicated: Secondary | ICD-10-CM | POA: Diagnosis not present

## 2018-03-19 ENCOUNTER — Ambulatory Visit
Admission: RE | Admit: 2018-03-19 | Discharge: 2018-03-19 | Disposition: A | Discharge status: Home / Self Care | Payer: Medicare PPO | Source: Ambulatory Visit | Attending: Internal Medicine | Admitting: Internal Medicine

## 2018-03-19 DIAGNOSIS — Z1231 Encounter for screening mammogram for malignant neoplasm of breast: Secondary | ICD-10-CM

## 2018-04-10 ENCOUNTER — Ambulatory Visit (INDEPENDENT_AMBULATORY_CARE_PROVIDER_SITE_OTHER): Payer: Medicare PPO | Admitting: Orthopaedic Surgery

## 2018-05-28 ENCOUNTER — Telehealth (INDEPENDENT_AMBULATORY_CARE_PROVIDER_SITE_OTHER): Payer: Self-pay | Admitting: Physician Assistant

## 2018-05-28 MED ORDER — CYCLOBENZAPRINE HCL 10 MG PO TABS: 10.0000 mg | ORAL_TABLET | Freq: Two times a day (BID) | ORAL | 1 refills | Status: DC | PRN

## 2018-05-28 NOTE — Telephone Encounter (Signed)
Please advise 

## 2018-05-28 NOTE — Telephone Encounter (Signed)
Patient called wanting a refill on Flexril stating still having problems with the muscles in hip, arm.  Please call patient to advise

## 2018-05-28 NOTE — Telephone Encounter (Signed)
I sent some in 

## 2018-06-23 ENCOUNTER — Telehealth (INDEPENDENT_AMBULATORY_CARE_PROVIDER_SITE_OTHER): Payer: Self-pay | Admitting: Orthopaedic Surgery

## 2018-06-23 NOTE — Telephone Encounter (Signed)
Patient left a message stating that she needs to have her handicap placard renewed.  CB#[phone].  Thank you.

## 2018-06-23 NOTE — Telephone Encounter (Signed)
That will be fine. 

## 2018-06-23 NOTE — Telephone Encounter (Signed)
Ok

## 2018-06-24 NOTE — Telephone Encounter (Signed)
Patient aware handicap at front desk  

## 2018-07-20 DIAGNOSIS — J209 Acute bronchitis, unspecified: Secondary | ICD-10-CM | POA: Diagnosis not present

## 2018-07-20 DIAGNOSIS — J019 Acute sinusitis, unspecified: Secondary | ICD-10-CM | POA: Diagnosis not present

## 2018-07-20 DIAGNOSIS — J45909 Unspecified asthma, uncomplicated: Secondary | ICD-10-CM | POA: Diagnosis not present

## 2018-08-01 DIAGNOSIS — J45909 Unspecified asthma, uncomplicated: Secondary | ICD-10-CM | POA: Diagnosis not present

## 2018-09-01 ENCOUNTER — Telehealth (INDEPENDENT_AMBULATORY_CARE_PROVIDER_SITE_OTHER): Payer: Self-pay | Admitting: Orthopaedic Surgery

## 2018-09-01 NOTE — Telephone Encounter (Signed)
Called patient no answer and no answering machine pickup  °

## 2018-09-03 ENCOUNTER — Encounter (INDEPENDENT_AMBULATORY_CARE_PROVIDER_SITE_OTHER): Payer: Self-pay | Admitting: Physician Assistant

## 2018-09-03 ENCOUNTER — Ambulatory Visit (INDEPENDENT_AMBULATORY_CARE_PROVIDER_SITE_OTHER): Payer: Medicare PPO

## 2018-09-03 ENCOUNTER — Ambulatory Visit (INDEPENDENT_AMBULATORY_CARE_PROVIDER_SITE_OTHER): Payer: Medicare PPO | Admitting: Physician Assistant

## 2018-09-03 DIAGNOSIS — M25552 Pain in left hip: Secondary | ICD-10-CM

## 2018-09-03 DIAGNOSIS — M545 Low back pain, unspecified: Secondary | ICD-10-CM

## 2018-09-03 DIAGNOSIS — M7062 Trochanteric bursitis, left hip: Secondary | ICD-10-CM

## 2018-09-03 MED ORDER — MELOXICAM 7.5 MG PO TABS: 7.5000 mg | ORAL_TABLET | Freq: Two times a day (BID) | ORAL | 1 refills | Status: DC

## 2018-09-03 MED ORDER — CYCLOBENZAPRINE HCL 10 MG PO TABS: 10.0000 mg | ORAL_TABLET | Freq: Two times a day (BID) | ORAL | 1 refills | Status: DC | PRN

## 2018-09-03 MED ORDER — LIDOCAINE HCL 1 % IJ SOLN
3.0000 mL | INTRAMUSCULAR | Status: AC | PRN
  Administered 2018-09-03: 3 mL

## 2018-09-03 MED ORDER — METHYLPREDNISOLONE ACETATE 40 MG/ML IJ SUSP
40.0000 mg | INTRAMUSCULAR | Status: AC | PRN
  Administered 2018-09-03: 40 mg via INTRA_ARTICULAR

## 2018-09-03 NOTE — Progress Notes (Signed)
Office Visit Note   Patient: Kathy Duran           Date of Birth: 06/12/1946           MRN: [id number] Visit Date: 09/03/2018              Requested by: Georgann Housekeeper, MD [address] [place], [place] [address] PCP: Georgann Housekeeper, MD   Assessment & Plan: Visit Diagnoses:  1. Left-sided low back pain without sciatica, unspecified chronicity   2. Pain in left hip     Plan: She is responded to trochanteric injection in the left hip in the past and stretching exercises.  She also has home exercise program for her back.  We will have her begin taking meloxicam and Flexeril.  Have her follow-up in 2 weeks to see what type of response she has had to this treatment.  Questions were encouraged and answered at length.  Did offer formal physical therapy she deferred.  Follow-Up Instructions: Return in about 2 weeks (around 09/17/2018).   Orders:  Orders Placed This Encounter  Procedures  . XR Lumbar Spine 2-3 Views  . XR HIP UNILAT W OR W/O PELVIS 2-3 VIEWS LEFT   Meds ordered this encounter  Medications  . cyclobenzaprine (FLEXERIL) 10 MG tablet    Sig: Take 1 tablet (10 mg total) by mouth 2 (two) times daily as needed for muscle spasms.    Dispense:  40 tablet    Refill:  1  . meloxicam (MOBIC) 7.5 MG tablet    Sig: Take 1 tablet (7.5 mg total) by mouth 2 (two) times daily.    Dispense:  60 tablet    Refill:  1      Procedures: Large Joint Inj: L greater trochanter on 09/03/2018 3:52 PM Indications: pain Details: 22 G 1.5 in needle, lateral approach  Arthrogram: No  Medications: 3 mL lidocaine 1 %; 40 mg methylPREDNISolone acetate 40 MG/ML Outcome: tolerated well, no immediate complications Procedure, treatment alternatives, risks and benefits explained, specific risks discussed. Consent was given by the patient. Immediately prior to procedure a time out was called to verify the correct patient, procedure, equipment, support staff and site/side marked as  required. Patient was prepped and draped in the usual sterile fashion.       Clinical Data: No additional findings.   Subjective: Chief Complaint  Patient presents with  . Left Hip - Pain  . Spine - Pain    HPI Mrs. Kathy Duran is well-known to Dr. Magnus Ivan service comes in today due to left hip pain and low back pain.  States she started having left hip pain and hurting across her lower back about 2 months ago.  She is been helping her mother who is in her 90s and lifting on her a lot.  She is having no radicular symptoms down the leg.  She does have pain lateral aspect of the left hip.  She status post left total hip arthroplasty 02/24/2016 by Dr. Magnus Ivan.  She is tried Advil and Tylenol without much relief.  Said no bowel bladder dysfunction.  No fevers chills. Review of Systems Please see HPI otherwise negative or noncontributory Objective: Vital Signs: There were no vitals taken for this visit.  Physical Exam Constitutional:      Appearance: She is not ill-appearing or diaphoretic.  Pulmonary:     Effort: Pulmonary effort is normal.  Neurological:     Mental Status: She is alert and oriented to person, place,  and time.  Psychiatric:        Mood and Affect: Mood normal.     Ortho Exam Lower extremities she has 5-5 strength throughout against resistance.  Deep tendon reflexes are 1+ at the ankles and 2+ at the the knees and equal and symmetric.  She has full forward flexion and able touch her toes.  Extension lumbar spine causes pain.  She has discomfort left lower lumbar paraspinous region.  Bilateral hips good range of motion.  She has tenderness over both hips left greater than right in the trochanteric region.  Negative straight leg raise bilaterally. Specialty Comments:  No specialty comments available.  Imaging: Xr Hip Unilat W Or W/o Pelvis 2-3 Views Left  Result Date: 09/03/2018 AP pelvis lateral view left hip: Status post bilateral total hip arthroplasties without any  hardware failure.  No acute fractures.  Both legs well again.  Xr Lumbar Spine 2-3 Views  Result Date: 09/03/2018 Lumbar spine AP lateral views: Loss of lordotic curvature.  Facet changes lower lumbar spine.  Degenerative changes throughout lumbar spine especially lower lumbar spine with loss of disc space at L2-3 L3-4 and endplate spurring.  No acute fractures.  Minimal scoliosis lower lumbar spine with the convexity to the left.  No spondylolisthesis.    PMFS History: Patient Active Problem List   Diagnosis Date Noted  . Trochanteric bursitis, left hip 03/07/2017  . Lymphedema 12/06/2016  . Bilateral bunions 08/21/2016  . Seasonal allergies 07/06/2016  . Asthma 07/06/2016  . Sjogren's syndrome (HCC) 07/06/2016  . Bilateral primary osteoarthritis of knee 07/06/2016  . Osteoarthritis of both hands 07/06/2016  . Myofacial muscle pain 07/06/2016  . DDD lumbar spine with spinal stenosis 07/06/2016  . Abnormality of gait 06/26/2016  . Chronic pain of right ankle 06/26/2016  . H/O bilateral hip replacements 02/24/2016  . Varicose veins 04/20/2015  . Lipidemia 04/20/2015  . Rheumatoid factor positive 11/20/2013  . Back pain with left-sided sciatica 11/20/2013  . HTN (hypertension) 11/20/2013  . GERD (gastroesophageal reflux disease) 11/20/2013  . Bladder spasm 11/20/2013  . Hyperlipidemia 11/20/2013  . Bilateral swelling of feet   . Bruises easily    Past Medical History:  Diagnosis Date  . Anxiety   . Arthritis    left hip  . Asthma    Inhalers for tx  . Bilateral primary osteoarthritis of knee 07/06/2016   Moderate  . Bilateral swelling of feet    bilateral ankle and feet edema  . Bruises easily   . DDD (degenerative disc disease), lumbar 07/06/2016  . Hypertension   . Myofacial muscle pain 07/06/2016  . Osteoarthritis of both hands 07/06/2016  . Seasonal allergies 07/06/2016  . Sinus complaint   . Sjogren's syndrome (HCC)    dry eyes  . Tenonitis   . Urinary  incontinence    occ. an issue wears pads    Family History  Problem Relation Age of Onset  . Arrhythmia Mother   . Stroke Mother   . Hyperlipidemia Mother   . Diabetes Mother   . Hypertension Mother   . Heart attack Father   . Diabetes Father   . Cancer Father   . Hypertension Father   . Hyperlipidemia Maternal Grandmother   . Hypertension Maternal Grandmother   . Heart attack Paternal Grandfather   . Stroke Paternal Grandfather   . Asthma Brother   . Emphysema Brother   . Hypertension Brother   . Diabetes Sister   . Hypertension Sister   .  Hyperlipidemia Sister     Past Surgical History:  Procedure Laterality Date  . ABDOMINAL HYSTERECTOMY    . BACK SURGERY    . BREAST CYST ASPIRATION    . BREAST SURGERY Right    cyst drainage  . CARPAL TUNNEL RELEASE Right 2001  . SHOULDER SURGERY Right    right shoulder tendon and rotator cuff repair  . TOTAL HIP ARTHROPLASTY Right 2006  . TOTAL HIP ARTHROPLASTY Left 02/24/2016   Procedure: LEFT TOTAL HIP ARTHROPLASTY ANTERIOR APPROACH;  Surgeon: Kathryne Hitch, MD;  Location: [facility];  Service: Orthopedics;  Laterality: Left;   Social History   Occupational History  . Not on file  Tobacco Use  . Smoking status: Former Smoker    Last attempt to quit: 11/21/1983    Years since quitting: 34.8  . Smokeless tobacco: Never Used  Substance and Sexual Activity  . Alcohol use: No  . Drug use: No  . Sexual activity: Yes

## 2018-09-09 DIAGNOSIS — E559 Vitamin D deficiency, unspecified: Secondary | ICD-10-CM | POA: Diagnosis not present

## 2018-09-09 DIAGNOSIS — M3500 Sicca syndrome, unspecified: Secondary | ICD-10-CM | POA: Diagnosis not present

## 2018-09-09 DIAGNOSIS — Z1389 Encounter for screening for other disorder: Secondary | ICD-10-CM | POA: Diagnosis not present

## 2018-09-09 DIAGNOSIS — J309 Allergic rhinitis, unspecified: Secondary | ICD-10-CM | POA: Diagnosis not present

## 2018-09-09 DIAGNOSIS — J45909 Unspecified asthma, uncomplicated: Secondary | ICD-10-CM | POA: Diagnosis not present

## 2018-09-09 DIAGNOSIS — E782 Mixed hyperlipidemia: Secondary | ICD-10-CM | POA: Diagnosis not present

## 2018-09-09 DIAGNOSIS — R7303 Prediabetes: Secondary | ICD-10-CM | POA: Diagnosis not present

## 2018-09-09 DIAGNOSIS — I10 Essential (primary) hypertension: Secondary | ICD-10-CM | POA: Diagnosis not present

## 2018-09-09 DIAGNOSIS — M179 Osteoarthritis of knee, unspecified: Secondary | ICD-10-CM | POA: Diagnosis not present

## 2018-09-09 DIAGNOSIS — Z23 Encounter for immunization: Secondary | ICD-10-CM | POA: Diagnosis not present

## 2018-09-09 DIAGNOSIS — Z0000 Encounter for general adult medical examination without abnormal findings: Secondary | ICD-10-CM | POA: Diagnosis not present

## 2018-09-09 DIAGNOSIS — K219 Gastro-esophageal reflux disease without esophagitis: Secondary | ICD-10-CM | POA: Diagnosis not present

## 2018-09-17 ENCOUNTER — Ambulatory Visit (INDEPENDENT_AMBULATORY_CARE_PROVIDER_SITE_OTHER): Payer: Medicare PPO | Admitting: Physician Assistant

## 2018-11-17 ENCOUNTER — Other Ambulatory Visit (INDEPENDENT_AMBULATORY_CARE_PROVIDER_SITE_OTHER): Payer: Self-pay | Admitting: Physician Assistant

## 2018-11-17 NOTE — Telephone Encounter (Signed)
Ok to rf? 

## 2018-12-02 DIAGNOSIS — R05 Cough: Secondary | ICD-10-CM | POA: Diagnosis not present

## 2018-12-02 DIAGNOSIS — R6883 Chills (without fever): Secondary | ICD-10-CM | POA: Diagnosis not present

## 2018-12-03 DIAGNOSIS — R05 Cough: Secondary | ICD-10-CM | POA: Diagnosis not present

## 2019-01-22 ENCOUNTER — Telehealth: Payer: Self-pay

## 2019-01-22 NOTE — Telephone Encounter (Signed)
Patient would like to have her Handicap Placard renewed.  Cb# is [phone].  Please advise.  Thank You.

## 2019-01-22 NOTE — Telephone Encounter (Signed)
Patient aware this is ready She asked if I could mail this to her

## 2019-02-10 ENCOUNTER — Other Ambulatory Visit (INDEPENDENT_AMBULATORY_CARE_PROVIDER_SITE_OTHER): Payer: Self-pay | Admitting: Physician Assistant

## 2019-02-10 NOTE — Telephone Encounter (Signed)
Ok to rf? 

## 2019-03-03 DIAGNOSIS — J45909 Unspecified asthma, uncomplicated: Secondary | ICD-10-CM | POA: Diagnosis not present

## 2019-03-03 DIAGNOSIS — R05 Cough: Secondary | ICD-10-CM | POA: Diagnosis not present

## 2019-03-20 ENCOUNTER — Other Ambulatory Visit: Payer: Self-pay | Admitting: Internal Medicine

## 2019-03-20 DIAGNOSIS — Z1231 Encounter for screening mammogram for malignant neoplasm of breast: Secondary | ICD-10-CM

## 2019-03-26 DIAGNOSIS — E782 Mixed hyperlipidemia: Secondary | ICD-10-CM | POA: Diagnosis not present

## 2019-03-26 DIAGNOSIS — K219 Gastro-esophageal reflux disease without esophagitis: Secondary | ICD-10-CM | POA: Diagnosis not present

## 2019-03-26 DIAGNOSIS — M179 Osteoarthritis of knee, unspecified: Secondary | ICD-10-CM | POA: Diagnosis not present

## 2019-03-26 DIAGNOSIS — R7303 Prediabetes: Secondary | ICD-10-CM | POA: Diagnosis not present

## 2019-03-26 DIAGNOSIS — I10 Essential (primary) hypertension: Secondary | ICD-10-CM | POA: Diagnosis not present

## 2019-03-26 DIAGNOSIS — M3500 Sicca syndrome, unspecified: Secondary | ICD-10-CM | POA: Diagnosis not present

## 2019-03-26 DIAGNOSIS — J45909 Unspecified asthma, uncomplicated: Secondary | ICD-10-CM | POA: Diagnosis not present

## 2019-04-10 ENCOUNTER — Telehealth: Payer: Self-pay | Admitting: Orthopaedic Surgery

## 2019-04-10 NOTE — Telephone Encounter (Signed)
Returned call to patient asked for a call back to schedule an appointment

## 2019-04-13 ENCOUNTER — Ambulatory Visit (INDEPENDENT_AMBULATORY_CARE_PROVIDER_SITE_OTHER): Payer: Medicare PPO

## 2019-04-13 ENCOUNTER — Ambulatory Visit (INDEPENDENT_AMBULATORY_CARE_PROVIDER_SITE_OTHER): Payer: Medicare PPO | Admitting: Physician Assistant

## 2019-04-13 ENCOUNTER — Encounter: Payer: Self-pay | Admitting: Physician Assistant

## 2019-04-13 VITALS — Ht 65.0 in | Wt 225.0 lb

## 2019-04-13 DIAGNOSIS — G8929 Other chronic pain: Secondary | ICD-10-CM

## 2019-04-13 DIAGNOSIS — M25562 Pain in left knee: Secondary | ICD-10-CM | POA: Diagnosis not present

## 2019-04-13 MED ORDER — LIDOCAINE HCL 1 % IJ SOLN
3.0000 mL | INTRAMUSCULAR | Status: AC | PRN
  Administered 2019-04-13: 3 mL

## 2019-04-13 MED ORDER — METHYLPREDNISOLONE ACETATE 40 MG/ML IJ SUSP
40.0000 mg | INTRAMUSCULAR | Status: AC | PRN
  Administered 2019-04-13: 40 mg via INTRA_ARTICULAR

## 2019-04-13 NOTE — Progress Notes (Signed)
Office Visit Note   Patient: Kathy Duran           Date of Birth: 1945/08/09           MRN: [id number] Visit Date: 04/13/2019              Requested by: Georgann Housekeeper, MD [address] [place],  [place] [address] PCP: Georgann Housekeeper, MD   Assessment & Plan: Visit Diagnoses:  1. Chronic pain of left knee     Plan: We will see her back in 2 weeks to see what type of response she had to the injection.  She is to be mindful of any mechanical symptoms that continue in the knee.  Questions encouraged and answered at length.  Follow-Up Instructions: Return in about 2 weeks (around 04/27/2019).   Orders:  Orders Placed This Encounter  Procedures  . Large Joint Inj  . XR KNEE 3 VIEW LEFT   No orders of the defined types were placed in this encounter.     Procedures: Large Joint Inj: L knee on 04/13/2019 10:15 AM Indications: pain Details: 22 G 1.5 in needle, anterolateral approach  Arthrogram: No  Medications: 3 mL lidocaine 1 %; 40 mg methylPREDNISolone acetate 40 MG/ML Outcome: tolerated well, no immediate complications Procedure, treatment alternatives, risks and benefits explained, specific risks discussed. Consent was given by the patient. Immediately prior to procedure a time out was called to verify the correct patient, procedure, equipment, support staff and site/side marked as required. Patient was prepped and draped in the usual sterile fashion.       Clinical Data: No additional findings.   Subjective: Chief Complaint  Patient presents with  . Left Knee - Pain    HPI York Spaniel he is well-known.by my service comes in today with left knee pain for the past 4 months no known injury.  Pain is mostly medial aspect of the knee.  She is states that she has difficulty flexing the knee back and feels like it catches at times.  She also notes that her knee gives way.  She has painful popping in the knee again mostly medial aspect.  She is taken Advil and  Tylenol for this.  Review of Systems No fevers chills shortness breath chest pain please see HPI otherwise negative  Objective: Vital Signs: Ht 5\' 5"  (1.651 m)   Wt 225 lb (102.1 kg)   BMI 37.44 kg/m   Physical Exam Constitutional:      Appearance: She is not ill-appearing or diaphoretic.  Pulmonary:     Effort: Pulmonary effort is normal.  Neurological:     Mental Status: She is alert.  Psychiatric:        Mood and Affect: Mood normal.     Ortho Exam Patient walks with antalgic gait.  She is able do straight leg raise bilaterally but has difficulty with flexing the left knee.  Good range of motion of the left hip without pain.  Tenderness along the medial joint line peripatellar region of the left knee.  No abnormal warmth erythema or effusion.  Positive McMurray's left knee.  Valgus deformities of both knees.  No instability valgus varus stressing of either knee. Specialty Comments:  No specialty comments available.  Imaging: Xr Knee 3 View Left  Result Date: 04/13/2019 Left knee AP, sunrise and lateral views.  Chondrocalcinosis.  No acute fracture.  Knee is well located.  Moderate patellofemoral changes.  No bony abnormalities otherwise.    PMFS History:  Patient Active Problem List   Diagnosis Date Noted  . Trochanteric bursitis, left hip 03/07/2017  . Lymphedema 12/06/2016  . Bilateral bunions 08/21/2016  . Seasonal allergies 07/06/2016  . Asthma 07/06/2016  . Sjogren's syndrome ([place]) 07/06/2016  . Bilateral primary osteoarthritis of knee 07/06/2016  . Osteoarthritis of both hands 07/06/2016  . Myofacial muscle pain 07/06/2016  . DDD lumbar spine with spinal stenosis 07/06/2016  . Abnormality of gait 06/26/2016  . Chronic pain of right ankle 06/26/2016  . H/O bilateral hip replacements 02/24/2016  . Varicose veins 04/20/2015  . Lipidemia 04/20/2015  . Rheumatoid factor positive 11/20/2013  . Back pain with left-sided sciatica 11/20/2013  . HTN (hypertension)  11/20/2013  . GERD (gastroesophageal reflux disease) 11/20/2013  . Bladder spasm 11/20/2013  . Hyperlipidemia 11/20/2013  . Bilateral swelling of feet   . Bruises easily    Past Medical History:  Diagnosis Date  . Anxiety   . Arthritis    left hip  . Asthma    Inhalers for tx  . Bilateral primary osteoarthritis of knee 07/06/2016   Moderate  . Bilateral swelling of feet    bilateral ankle and feet edema  . Bruises easily   . DDD (degenerative disc disease), lumbar 07/06/2016  . Hypertension   . Myofacial muscle pain 07/06/2016  . Osteoarthritis of both hands 07/06/2016  . Seasonal allergies 07/06/2016  . Sinus complaint   . Sjogren's syndrome ([place])    dry eyes  . Tenonitis   . Urinary incontinence    occ. an issue wears pads    Family History  Problem Relation Age of Onset  . Arrhythmia Mother   . Stroke Mother   . Hyperlipidemia Mother   . Diabetes Mother   . Hypertension Mother   . Heart attack Father   . Diabetes Father   . Cancer Father   . Hypertension Father   . Hyperlipidemia Maternal Grandmother   . Hypertension Maternal Grandmother   . Heart attack Paternal Grandfather   . Stroke Paternal Grandfather   . Asthma Brother   . Emphysema Brother   . Hypertension Brother   . Diabetes Sister   . Hypertension Sister   . Hyperlipidemia Sister     Past Surgical History:  Procedure Laterality Date  . ABDOMINAL HYSTERECTOMY    . BACK SURGERY    . BREAST CYST ASPIRATION    . BREAST SURGERY Right    cyst drainage  . CARPAL TUNNEL RELEASE Right 2001  . SHOULDER SURGERY Right    right shoulder tendon and rotator cuff repair  . TOTAL HIP ARTHROPLASTY Right 2006  . TOTAL HIP ARTHROPLASTY Left 02/24/2016   Procedure: LEFT TOTAL HIP ARTHROPLASTY ANTERIOR APPROACH;  Surgeon: Mcarthur Rossetti, MD;  Location: [facility];  Service: Orthopedics;  Laterality: Left;   Social History   Occupational History  . Not on file  Tobacco Use  . Smoking status: Former  Smoker    Quit date: 11/21/1983    Years since quitting: 35.4  . Smokeless tobacco: Never Used  Substance and Sexual Activity  . Alcohol use: No  . Drug use: No  . Sexual activity: Yes

## 2019-04-27 ENCOUNTER — Other Ambulatory Visit: Payer: Self-pay

## 2019-04-27 ENCOUNTER — Encounter: Payer: Self-pay | Admitting: Orthopaedic Surgery

## 2019-04-27 ENCOUNTER — Ambulatory Visit (INDEPENDENT_AMBULATORY_CARE_PROVIDER_SITE_OTHER): Payer: Medicare PPO | Admitting: Orthopaedic Surgery

## 2019-04-27 DIAGNOSIS — G8929 Other chronic pain: Secondary | ICD-10-CM

## 2019-04-27 DIAGNOSIS — M25562 Pain in left knee: Secondary | ICD-10-CM

## 2019-04-27 NOTE — Progress Notes (Signed)
The patient is well-known to me.  She is a very active 73 year old female and has a history of both her hips replaced.  I replaced her left hip.  Her left knee has been bothering her for some time now x-rays of her knee showed mild joint space narrowing.  On clinical exam she has more of a valgus malalignment of the knee and is very painful for me trying to put her through range of motion today in the office.  Last month we did place a steroid injection in her left knee and she says it only really worked while the knee was numb.  She is using a cane to ambulate and uses this in her opposite hand.  We did go over x-ray findings from her last visit as well as her exam today.  I do feel at this point it is worth obtaining an MRI of her left knee to assess for any meniscal tear or cartilage defect given her plain film findings still showing decently maintained joint space.  Based on clinical exam today she still having the same amount of pain and swelling in that knee.  I explained the rationale behind obtaining an MRI.  Should continue activity modification as well as using her cane.  At her next visit I would like her to be weighed and a BMI calculated.  We will also go over the MRI of her left knee with her.  All question concerns were answered and addressed.

## 2019-05-06 ENCOUNTER — Other Ambulatory Visit: Payer: Self-pay

## 2019-05-06 ENCOUNTER — Ambulatory Visit
Admission: RE | Admit: 2019-05-06 | Discharge: 2019-05-06 | Disposition: A | Discharge status: Home / Self Care | Payer: Medicare PPO | Source: Ambulatory Visit | Attending: Internal Medicine | Admitting: Internal Medicine

## 2019-05-06 DIAGNOSIS — Z1231 Encounter for screening mammogram for malignant neoplasm of breast: Secondary | ICD-10-CM | POA: Diagnosis not present

## 2019-05-13 IMAGING — MR MR LUMBAR SPINE W/O CM
4 of 5 series · 25 of 48 positions shown · non-contrast
Comparison: 06/08/2005

CLINICAL DATA: Left buttock, hip and thigh pain, 3-4 months
duration.

EXAM:
MRI LUMBAR SPINE WITHOUT CONTRAST
TECHNIQUE: Multiplanar, multisequence MR imaging of the lumbar spine was
performed. No intravenous contrast was administered.

[Series 3: T2 · sagittal · 4.0mm · 0.55mm/px · 6 of 14 slices shown (1 of 2)]
[im 1/14]
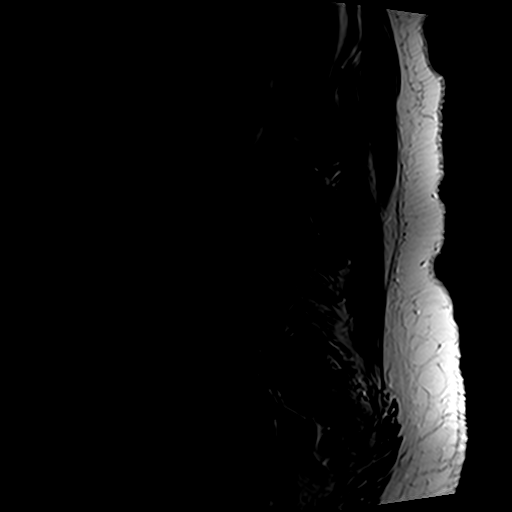
[im 3/14]
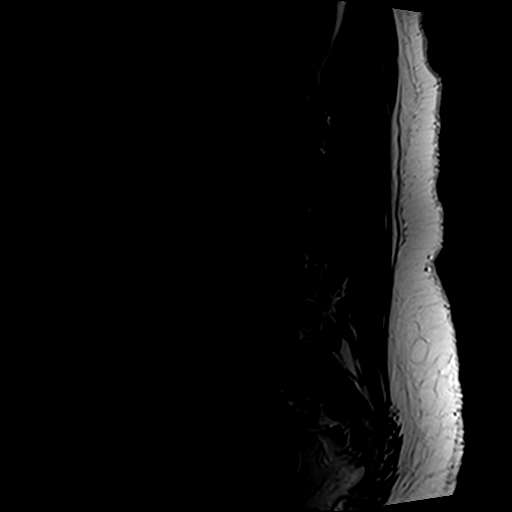
[im 6/14]
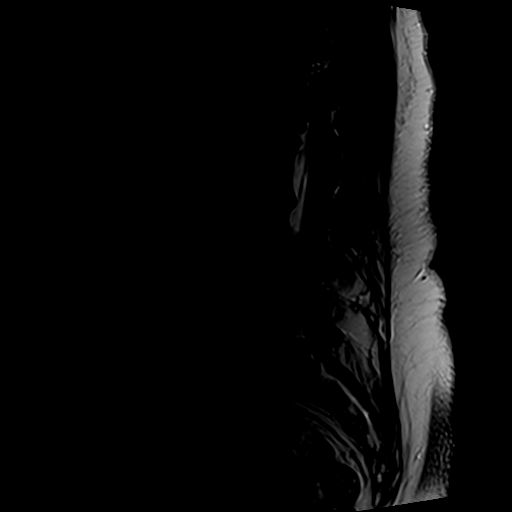
[im 8/14]
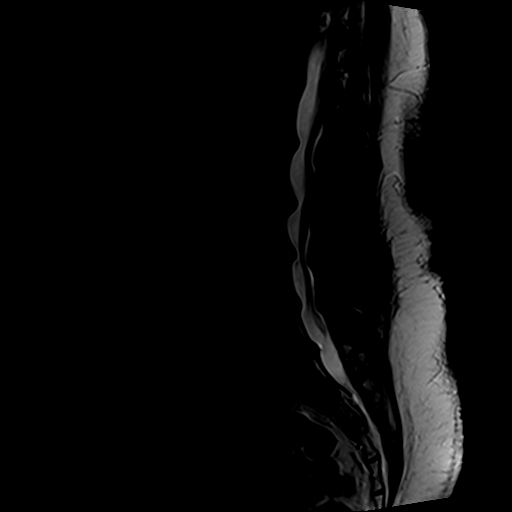
[im 11/14]
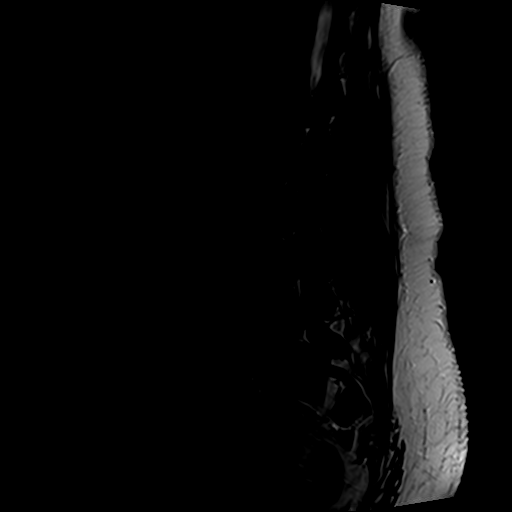
[im 14/14]
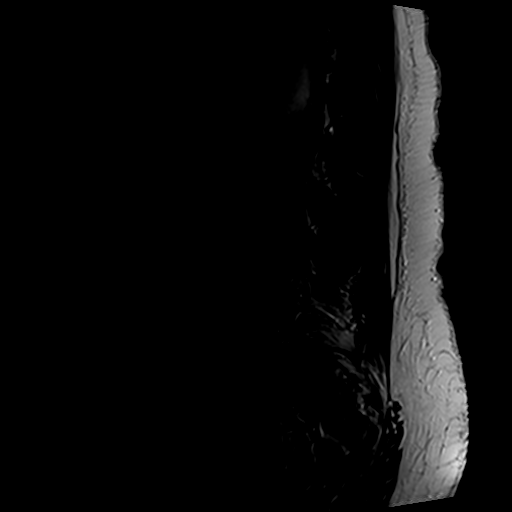

[Series 4: T1 · sagittal · 4.0mm · 0.55mm/px · 6 of 14 slices shown (1 of 2)]
[im 1/14]
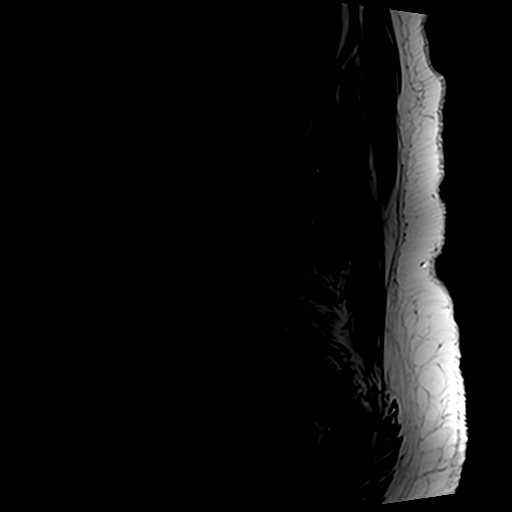
[im 3/14]
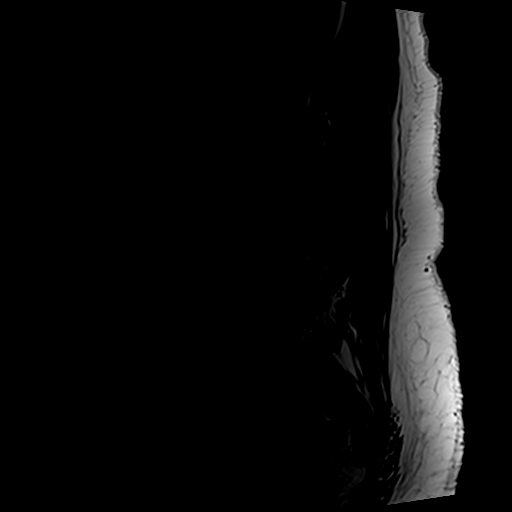
[im 6/14]
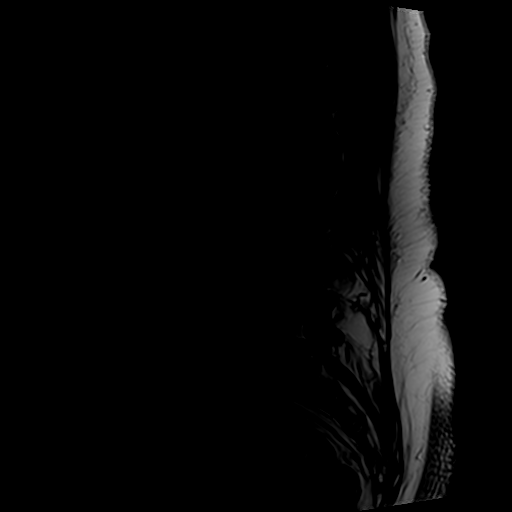
[im 8/14]
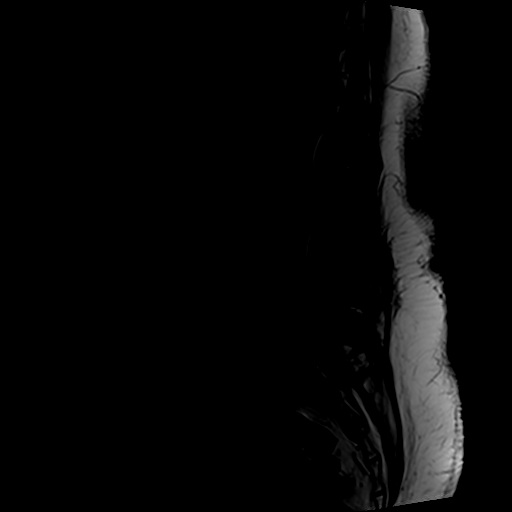
[im 11/14]
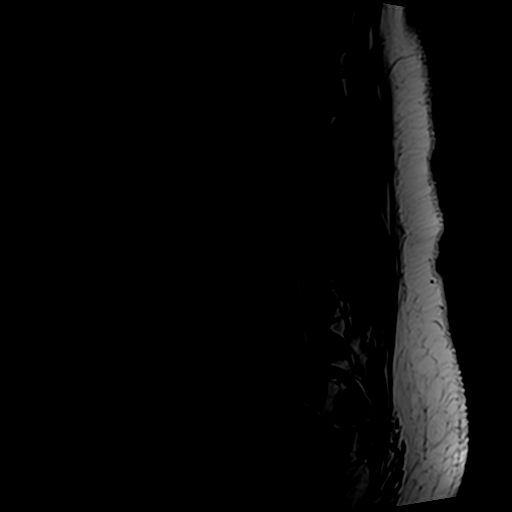
[im 14/14]
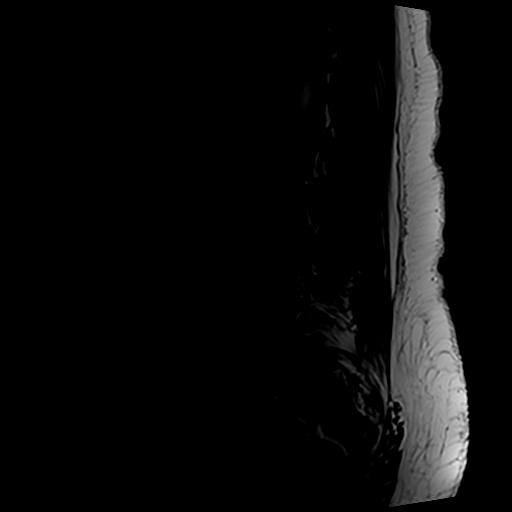

[Series 6: T2 · axial · 4.0mm · 0.70mm/px · z∈[-91,+95]mm · 9 of 36 slices shown (2 of 2)]
[im 1/36]
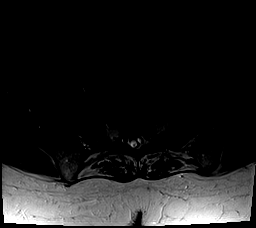
[im 6/36]
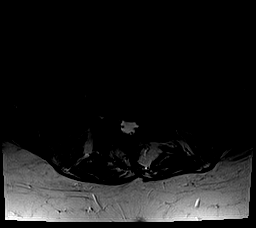
[im 11/36]
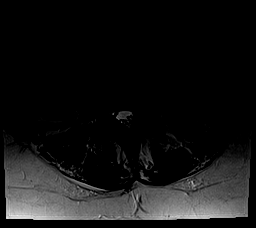
[im 16/36]
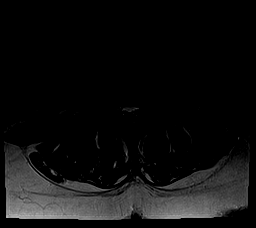
[im 18/36]
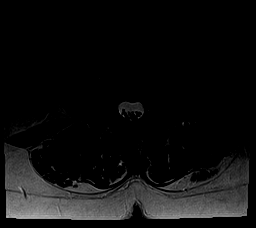
[im 21/36]
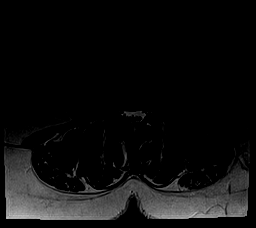
[im 26/36]
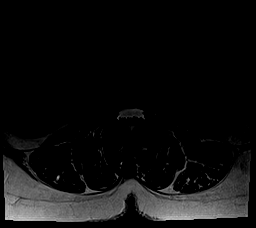
[im 31/36]
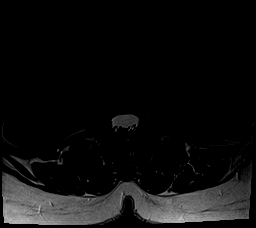
[im 36/36]
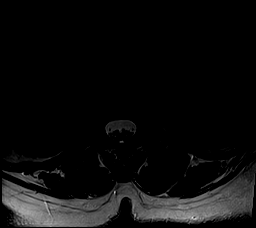

[Series 7: T1 · axial · 4.0mm · 0.35mm/px · z∈[-91,+70]mm · 4 of 36 slices shown (2 of 2)]
[im 1/36]
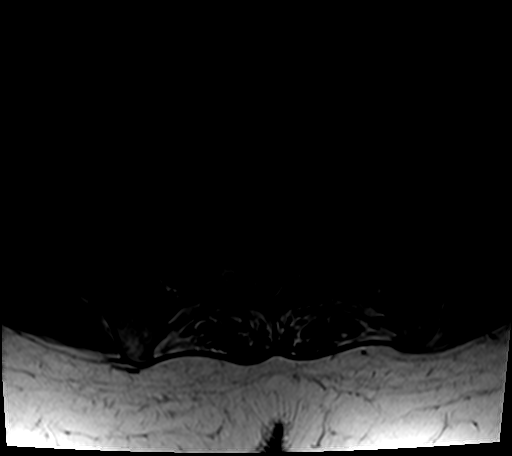
[im 6/36]
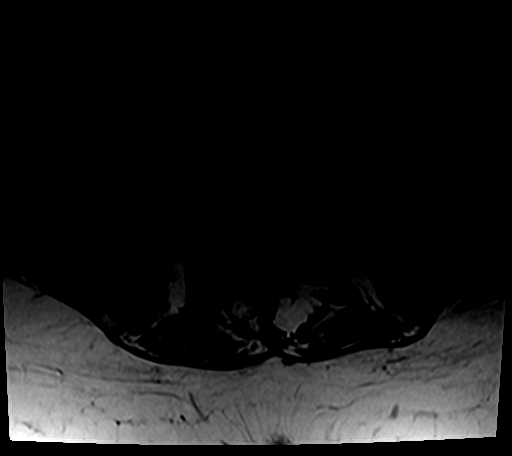
[im 18/36]
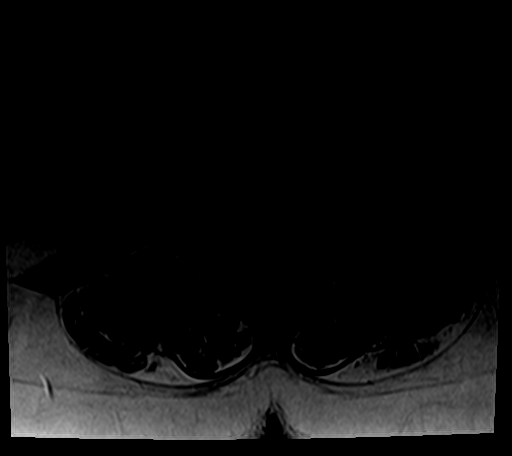
[im 31/36]
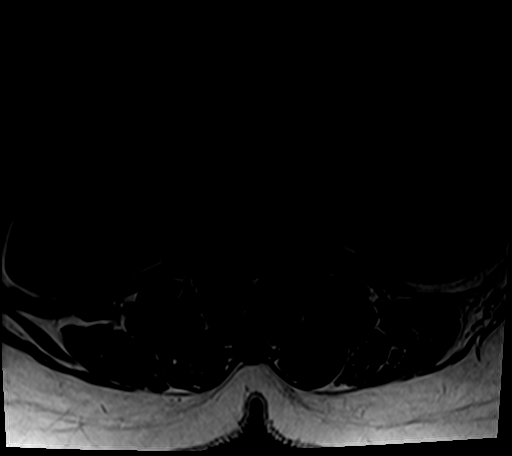

[25 of 48 positions shown; findings below may reference images not displayed]

FINDINGS: Segmentation:  5 lumbar type vertebral bodies.

Alignment:  Mild curvature convex to the left with the apex at L2-3.

Vertebrae:  No fracture or primary bone lesion.

Conus medullaris: Extends to the T12-L1 level and appears normal.

Paraspinal and other soft tissues: Negative

Disc levels:

T12-L1:  Normal.

L1-2: Disc bulge. Mild facet hypertrophy. No compressive stenosis.

L2-3: Broad-based disc herniation with central prominence. Mild
facet hypertrophy. Mild narrowing of both lateral recesses without
visible neural compression.

L3-4: Broad-based disc protrusion more prominent towards the left.
Facet and ligamentous hypertrophy. Stenosis of both lateral recesses
left more than right. Neural compression could occur at this level,
particularly on the left.

L4-5: Shallow disc protrusion with left foraminal extension. Facet
and ligamentous hypertrophy. Stenosis of the left lateral recess and
intervertebral foramen on the left could cause left-sided neural
compression.

L5-S1: Broad-based, left posterolateral prominent disc herniation
with caudal down turning. Displacement and compression of left S1
nerve. L5 nerves exit without compression.
IMPRESSION: Non-compressive disc disease at L1-2 and L2-3.

L3-4: Broad-based disc herniation more prominent towards the left.
Facet and ligamentous hypertrophy. Stenosis of both lateral recesses
left worse than right. Neural compression could occur on either
side, more likely the left.

L4-5: Shallow disc protrusion with left foraminal extension. Facet
and ligamentous hypertrophy. Neural compression could occur at this
level, particularly in the left in both the lateral recess and
intervertebral foramen.

L5-S1: Left posterolateral disc herniation with caudal down turning.
Displacement and compression of the left S1 nerve.

## 2019-05-17 ENCOUNTER — Ambulatory Visit
Admission: RE | Admit: 2019-05-17 | Discharge: 2019-05-17 | Disposition: A | Discharge status: Home / Self Care | Payer: Medicare PPO | Source: Ambulatory Visit | Attending: Orthopaedic Surgery | Admitting: Orthopaedic Surgery

## 2019-05-17 ENCOUNTER — Other Ambulatory Visit: Payer: Self-pay

## 2019-05-17 DIAGNOSIS — M25562 Pain in left knee: Secondary | ICD-10-CM | POA: Diagnosis not present

## 2019-05-17 DIAGNOSIS — G8929 Other chronic pain: Secondary | ICD-10-CM

## 2019-05-25 ENCOUNTER — Encounter: Payer: Self-pay | Admitting: Orthopaedic Surgery

## 2019-05-25 ENCOUNTER — Other Ambulatory Visit: Payer: Self-pay

## 2019-05-25 ENCOUNTER — Ambulatory Visit (INDEPENDENT_AMBULATORY_CARE_PROVIDER_SITE_OTHER): Payer: Medicare PPO | Admitting: Orthopaedic Surgery

## 2019-05-25 DIAGNOSIS — M25562 Pain in left knee: Secondary | ICD-10-CM

## 2019-05-25 DIAGNOSIS — M1712 Unilateral primary osteoarthritis, left knee: Secondary | ICD-10-CM | POA: Diagnosis not present

## 2019-05-25 DIAGNOSIS — S83272A Complex tear of lateral meniscus, current injury, left knee, initial encounter: Secondary | ICD-10-CM | POA: Diagnosis not present

## 2019-05-25 DIAGNOSIS — G8929 Other chronic pain: Secondary | ICD-10-CM

## 2019-05-25 NOTE — Progress Notes (Signed)
The patient comes in today to go over an MRI of her left knee.  She does ambulate using a cane.  We have tried conservative treatment including steroid injections in her knee as well as activity modification and weight loss.  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  Her pain is daily and is detriment affecting her mobility and her quality of life.  She did have a successful right knee arthroscopy done about a decade ago.  With her plain films still showing adequate joint space we sent her for an MRI of her left knee and this is also due to the failure of conservative treatment with steroid injection and other treatment modalities.  She is here review of the MRI today.  On exam her knee does have slight valgus malalignment.  There is no effusion but there is medial lateral joint line tenderness.  There is a positive McMurray sign to the lateral compartment.  The MRI is reviewed with her and it does show a horizontal tear of the lateral meniscus but there is also a meniscal root tear on the medial meniscus.  There is also mild to moderate arthritic changes in both compartments of the knee with a small focus of full-thickness cartilage loss on the lateral tibial plateau and the lateral femoral condyle.  At this point she would rather consider arthroscopic intervention then a knee replacement.  This is due to the fact that her right knee scope did well 10 years ago.  She understands that the left knee arthroscopy may only temporize her symptoms and will treat the arthritis but he could certainly treat the mechanical symptoms of locking catching from the meniscal tear.  She would like to consider surgery sometime after the first of the year when things are settling down from a social standpoint in her home.  We will see her back in early January to see if she still wants to proceed with surgery.  All question concerns were answered and  addressed.

## 2019-06-02 ENCOUNTER — Other Ambulatory Visit (INDEPENDENT_AMBULATORY_CARE_PROVIDER_SITE_OTHER): Payer: Self-pay | Admitting: Physician Assistant

## 2019-06-30 DIAGNOSIS — M3500 Sicca syndrome, unspecified: Secondary | ICD-10-CM | POA: Diagnosis not present

## 2019-06-30 DIAGNOSIS — M2550 Pain in unspecified joint: Secondary | ICD-10-CM | POA: Diagnosis not present

## 2019-07-28 ENCOUNTER — Ambulatory Visit: Payer: Medicare PPO | Admitting: Orthopaedic Surgery

## 2019-09-24 DIAGNOSIS — M1712 Unilateral primary osteoarthritis, left knee: Secondary | ICD-10-CM | POA: Diagnosis not present

## 2019-09-24 DIAGNOSIS — M25562 Pain in left knee: Secondary | ICD-10-CM | POA: Diagnosis not present

## 2019-09-25 ENCOUNTER — Telehealth: Payer: Self-pay

## 2019-09-25 NOTE — Telephone Encounter (Signed)
   [facility] Pre-operative Risk Assessment    Request for surgical clearance:  1. What type of surgery is being performed? Left total knee arthroplasty      2. When is this surgery scheduled? 11/16/19   3. What type of clearance is required (medical clearance vs. Pharmacy clearance to hold med vs. Both)? both  4. Are there any medications that need to be held prior to surgery and how long? aspirin  5. Practice name and name of physician performing surgery? [facility]   6. What is your office phone number [phone]    7.   What is your office fax number [phone] attn kelly hancock  8.   Anesthesia type (None, local, MAC, general) ? choice   Ena Dawley 09/25/2019, 5:12 PM  _________________________________________________________________   (provider comments below)

## 2019-09-28 NOTE — Telephone Encounter (Signed)
   Primary Cardiologist:Kenneth C Hilty, MD  Chart reviewed as part of pre-operative protocol coverage. Because of Kathy Duran's past medical history and time since last visit, he/she will require a follow-up visit in order to better assess preoperative cardiovascular risk.  Pre-op covering staff: - Please schedule appointment and call patient to inform them. - Please contact requesting surgeon's office via preferred method (i.e, phone, fax) to inform them of need for appointment prior to surgery.  If applicable, this message will also be routed to pharmacy pool and/or primary cardiologist for input on holding anticoagulant/antiplatelet agent as requested below so that this information is available at time of patient's appointment.   [place], [place]  09/28/2019, 8:53 AM

## 2019-09-28 NOTE — Telephone Encounter (Signed)
Left message to call to make appt for pre op clearance with Dr. Rennis Golden or APP.

## 2019-09-29 NOTE — Telephone Encounter (Signed)
Pt has been made aware she needs appt for pre op clearance. Pt is set to see Dr. Rennis Golden 10/26/19. Pt asked for first week of April 2021. Pt aware to be sure she has her mask with her which she will need to wear for the whole appt. Pt thanked me for the call and the help. I will send clearance notes to Dr. Rennis Golden for upcoming appt. I will remove from the pre op call back pool.

## 2019-09-29 NOTE — Telephone Encounter (Signed)
Faxed to requesting party to notify that pt needs appt

## 2019-10-02 NOTE — Progress Notes (Signed)
Office Visit Note  Patient: Kathy Duran             Date of Birth: 1946-02-13           MRN: [id number]             PCP: Georgann Housekeeper, MD Referring: Georgann Housekeeper, MD Visit Date: 10/07/2019 Occupation: @GUAROCC @  Subjective:  Joint pain and sicca symptoms.   History of Present Illness: Kathy Duran is a 74 y.o. female with history of Sjogren's, osteoarthritis and degenerative disc disease.  She returns today after her last visit in June 2017.  Patient states she was consumed taking care of her 68 year old mother who was recently shifted to nursing home.  She has more time to focus on her health now.  She continues to have dry mouth and dry eyes.  She was using Restasis which was helpful.  She has been also using over-the-counter products.  She had left total hip replacement in 2017 by Dr. 2018.  The right total hip replacement is doing well.  She continues to have pain and discomfort in her bilateral knee joints.  She has end-stage osteoarthritis.  Dr. Magnus Ivan is planning to replace her left knee joint in April 2021.  She has been also noticing some knots in her hands which she is concerned about.  She has been experiencing neck pain with left-sided radiculopathy which is causing nocturnal pain.  None of the other joints are painful.  Activities of Daily Living:  Patient reports morning stiffness for 1 hour.   Patient Reports nocturnal pain.  Difficulty dressing/grooming: Denies Difficulty climbing stairs: Reports Difficulty getting out of chair: Denies Difficulty using hands for taps, buttons, cutlery, and/or writing: Denies  Review of Systems  Constitutional: Positive for fatigue. Negative for night sweats, weight gain and weight loss.  HENT: Positive for mouth dryness and nose dryness. Negative for mouth sores, trouble swallowing and trouble swallowing.   Eyes: Positive for dryness. Negative for pain, redness and visual disturbance.  Respiratory: Positive for shortness  of breath. Negative for cough and difficulty breathing.         asthma  Cardiovascular: Negative for chest pain, palpitations, hypertension, irregular heartbeat and swelling in legs/feet.  Gastrointestinal: Negative for blood in stool, constipation and diarrhea.  Endocrine: Negative for increased urination.  Genitourinary: Negative for difficulty urinating, painful urination and vaginal dryness.  Musculoskeletal: Positive for arthralgias, gait problem, joint pain, morning stiffness and muscle tenderness. Negative for joint swelling, myalgias, muscle weakness and myalgias.  Skin: Negative for color change, rash, hair loss, skin tightness, ulcers and sensitivity to sunlight.  Allergic/Immunologic: Negative for susceptible to infections.  Neurological: Positive for numbness. Negative for dizziness, headaches, memory loss, night sweats and weakness.  Hematological: Negative for bruising/bleeding tendency and swollen glands.  Psychiatric/Behavioral: Positive for depressed mood. Negative for confusion and sleep disturbance. The patient is not nervous/anxious.     PMFS History:  Patient Active Problem List   Diagnosis Date Noted  . Unilateral primary osteoarthritis, left knee 05/25/2019  . Trochanteric bursitis, left hip 03/07/2017  . Lymphedema 12/06/2016  . Bilateral bunions 08/21/2016  . Seasonal allergies 07/06/2016  . Asthma 07/06/2016  . Sjogren's syndrome (HCC) 07/06/2016  . Bilateral primary osteoarthritis of knee 07/06/2016  . Osteoarthritis of both hands 07/06/2016  . Myofacial muscle pain 07/06/2016  . DDD lumbar spine with spinal stenosis 07/06/2016  . Abnormality of gait 06/26/2016  . Chronic pain of right ankle 06/26/2016  . H/O bilateral hip replacements  02/24/2016  . Varicose veins 04/20/2015  . Lipidemia 04/20/2015  . Rheumatoid factor positive 11/20/2013  . Back pain with left-sided sciatica 11/20/2013  . HTN (hypertension) 11/20/2013  . GERD (gastroesophageal reflux  disease) 11/20/2013  . Bladder spasm 11/20/2013  . Hyperlipidemia 11/20/2013  . Bilateral swelling of feet   . Bruises easily     Past Medical History:  Diagnosis Date  . Anxiety   . Arthritis    left hip  . Asthma    Inhalers for tx  . Bilateral primary osteoarthritis of knee 07/06/2016   Moderate  . Bilateral swelling of feet    bilateral ankle and feet edema  . Bruises easily   . DDD (degenerative disc disease), lumbar 07/06/2016  . Hypertension   . Myofacial muscle pain 07/06/2016  . Osteoarthritis of both hands 07/06/2016  . Seasonal allergies 07/06/2016  . Sinus complaint   . Sjogren's syndrome ([place])    dry eyes  . Tenonitis   . Urinary incontinence    occ. an issue wears pads    Family History  Problem Relation Age of Onset  . Arrhythmia Mother   . Stroke Mother   . Hyperlipidemia Mother   . Diabetes Mother   . Hypertension Mother   . Heart attack Father   . Diabetes Father   . Cancer Father   . Hypertension Father   . Hyperlipidemia Maternal Grandmother   . Hypertension Maternal Grandmother   . Heart attack Paternal Grandfather   . Stroke Paternal Grandfather   . Lung disease Brother   . Diabetes Sister   . Dementia Sister   . Allergies Daughter   . Healthy Daughter    Past Surgical History:  Procedure Laterality Date  . ABDOMINAL HYSTERECTOMY  1986  . BACK SURGERY  1985  . BREAST CYST ASPIRATION    . BREAST SURGERY Right    cyst drainage  . CARPAL TUNNEL RELEASE Right 2001  . KNEE SURGERY  1991  . SHOULDER SURGERY Right    right shoulder tendon and rotator cuff repair  . TOTAL HIP ARTHROPLASTY Right 2006  . TOTAL HIP ARTHROPLASTY Left 02/24/2016   Procedure: LEFT TOTAL HIP ARTHROPLASTY ANTERIOR APPROACH;  Surgeon: Mcarthur Rossetti, MD;  Location: [facility];  Service: Orthopedics;  Laterality: Left;   Social History   Social History Narrative  . Not on file    There is no immunization history on file for this patient.    Objective: Vital Signs: BP (!) 148/94 (BP Location: Right Arm, Patient Position: Sitting, Cuff Size: Normal)   Pulse (!) 105   Resp 15   Ht 5\' 5"  (1.651 m)   Wt 223 lb (101.2 kg)   BMI 37.11 kg/m    Physical Exam Vitals and nursing note reviewed.  Constitutional:      Appearance: She is well-developed.  HENT:     Head: Normocephalic and atraumatic.  Eyes:     Conjunctiva/sclera: Conjunctivae normal.  Cardiovascular:     Rate and Rhythm: Normal rate and regular rhythm.     Heart sounds: Normal heart sounds.  Pulmonary:     Effort: Pulmonary effort is normal.     Breath sounds: Normal breath sounds.  Abdominal:     General: Bowel sounds are normal.     Palpations: Abdomen is soft.  Musculoskeletal:     Cervical back: Normal range of motion.  Lymphadenopathy:     Cervical: No cervical adenopathy.  Skin:    General: Skin is warm and  dry.     Capillary Refill: Capillary refill takes less than 2 seconds.  Neurological:     Mental Status: She is alert and oriented to person, place, and time.  Psychiatric:        Behavior: Behavior normal.      Musculoskeletal Exam: C-spine was in good range of motion with minimal discomfort.  She has discomfort range of motion of her left shoulder joint.  Elbow joints, wrist joints, MCPs with good range of motion.  She has bilateral PIP and DIP thickening with no synovitis.  She had some discomfort range of motion of the left hip joint.  Both hip joints are replaced.  She has thickening of bilateral knee joints without any warmth swelling or effusion.  She has osteoarthritic changes in her feet with PIP and DIP thickening.  CDAI Exam: CDAI Score: -- Patient Global: --; Provider Global: -- Swollen: --; Tender: -- Joint Exam 10/07/2019   No joint exam has been documented for this visit   There is currently no information documented on the homunculus. Go to the Rheumatology activity and complete the homunculus joint  exam.  Investigation: No additional findings.  Imaging: No results found.  Recent Labs: Lab Results  Component Value Date   WBC 4.9 09/18/2016   HGB 13.7 09/18/2016   PLT 311 09/18/2016   NA 141 09/18/2016   K 4.3 09/18/2016   CL 104 09/18/2016   CO2 22 09/18/2016   GLUCOSE 82 09/18/2016   BUN 15 09/18/2016   CREATININE 0.71 09/18/2016   BILITOT 0.4 09/18/2016   ALKPHOS 99 09/18/2016   AST 22 09/18/2016   ALT 23 09/18/2016   PROT 7.0 09/18/2016   ALBUMIN 4.5 09/18/2016   CALCIUM 10.1 09/18/2016   GFRAA >89 09/18/2016    Speciality Comments: No specialty comments available.  Procedures:  Large Joint Inj: L subacromial bursa on 10/07/2019 10:16 AM Indications: pain Details: 27 G 1.5 in needle, posterior approach  Arthrogram: No  Medications: 1 mL lidocaine 1 %; 40 mg triamcinolone acetonide 40 MG/ML Aspirate: 0 mL Outcome: tolerated well, no immediate complications Procedure, treatment alternatives, risks and benefits explained, specific risks discussed. Consent was given by the patient. Immediately prior to procedure a time out was called to verify the correct patient, procedure, equipment, support staff and site/side marked as required. Patient was prepped and draped in the usual sterile fashion.     Allergies: Amoxicillin-pot clavulanate, Diclofenac-misoprostol, Latex, and Naproxen   Assessment / Plan:     Visit Diagnoses: Sjogren's syndrome, with unspecified organ involvement (HCC) - +ANA, +La, +RF.  Patient has been using Restasis and over-the-counter products.  I have advised her to get following labs at Dr. Venita Sheffield office.  CBC with differential, CMP with GFR, UA, ANA, Ro, La, RF, SPEP, C3-C4.  Neck pain -she has been having some stiffness in her cervical spine.  She also complains about left-sided radiculopathy.  Plan: XR Cervical Spine 2 or 3 views.  The x-rays show multilevel spondylosis with most severe narrowing between C7 and T1.  Chronic left shoulder  pain -she had painful range of motion of her left shoulder joint.  She states the pain from her shoulder is radiating down into her left arm.  She is also experiencing nocturnal pain.  Plan: XR Shoulder Left.  The x-ray was consistent with acromioclavicular arthritis.  After informed consent was obtained per patient's request the left subacromial bursa was injected with cortisone as described above.  She tolerated the procedure well.  She had immediate relief after the injection.  Pain in both hands-clinical findings are consistent with osteoarthritis.  She had no synovitis on examination.  Joint protection muscle strengthening was discussed.  Primary osteoarthritis of both hands  Bilateral primary osteoarthritis of knee -  scheduled to have left total knee replacement on November 16, 2019 by Dr. Magnus Ivan.  H/O bilateral hip replacements - Left total hip replacement 2017 by Dr. Magnus Ivan, right total hip replacement 2006 by Dr. Cleophas Dunker.  Patient is fairly good range of motion of bilateral hip joints.  Bilateral bunions-she has osteoarthritic changes in her bilateral feet with bunions and DIP and PIP thickening.  DDD (degenerative disc disease), cervical  DDD lumbar spine with spinal stenosis-chronic pain.  Fibromyalgia-she experiences generalized pain and discomfort.  She has been also under a lot of stress due to health of her mother.  Other medical problems are listed as follows:  Essential hypertension  History of asthma  Vitamin D deficiency  History of hyperlipidemia  Other insomnia  History of gastroesophageal reflux (GERD)  Prediabetes  Orders: Orders Placed This Encounter  Procedures  . XR Cervical Spine 2 or 3 views  . XR Shoulder Left   No orders of the defined types were placed in this encounter.   Face-to-face time spent with patient was 50 minutes. Greater than 50% of time was spent in counseling and coordination of care.  Follow-Up Instructions: Return for  Osteoarthritis, Sjogren's.   Pollyann Savoy, MD  Note - This record has been created using Animal nutritionist.  Chart creation errors have been sought, but may not always  have been located. Such creation errors do not reflect on  the standard of medical care.

## 2019-10-07 ENCOUNTER — Ambulatory Visit: Payer: Self-pay

## 2019-10-07 ENCOUNTER — Ambulatory Visit (INDEPENDENT_AMBULATORY_CARE_PROVIDER_SITE_OTHER): Payer: Medicare PPO | Admitting: Rheumatology

## 2019-10-07 ENCOUNTER — Other Ambulatory Visit: Payer: Self-pay

## 2019-10-07 ENCOUNTER — Encounter: Payer: Self-pay | Admitting: Rheumatology

## 2019-10-07 VITALS — BP 142/88 | HR 86 | Resp 15 | Ht 65.0 in | Wt 223.0 lb

## 2019-10-07 DIAGNOSIS — M25512 Pain in left shoulder: Secondary | ICD-10-CM

## 2019-10-07 DIAGNOSIS — M5030 Other cervical disc degeneration, unspecified cervical region: Secondary | ICD-10-CM

## 2019-10-07 DIAGNOSIS — M3500 Sicca syndrome, unspecified: Secondary | ICD-10-CM

## 2019-10-07 DIAGNOSIS — M21611 Bunion of right foot: Secondary | ICD-10-CM | POA: Diagnosis not present

## 2019-10-07 DIAGNOSIS — M19041 Primary osteoarthritis, right hand: Secondary | ICD-10-CM | POA: Diagnosis not present

## 2019-10-07 DIAGNOSIS — M47816 Spondylosis without myelopathy or radiculopathy, lumbar region: Secondary | ICD-10-CM

## 2019-10-07 DIAGNOSIS — G8929 Other chronic pain: Secondary | ICD-10-CM | POA: Diagnosis not present

## 2019-10-07 DIAGNOSIS — Z8709 Personal history of other diseases of the respiratory system: Secondary | ICD-10-CM

## 2019-10-07 DIAGNOSIS — Z8719 Personal history of other diseases of the digestive system: Secondary | ICD-10-CM

## 2019-10-07 DIAGNOSIS — Z8639 Personal history of other endocrine, nutritional and metabolic disease: Secondary | ICD-10-CM

## 2019-10-07 DIAGNOSIS — M797 Fibromyalgia: Secondary | ICD-10-CM

## 2019-10-07 DIAGNOSIS — M170 Bilateral primary osteoarthritis of knee: Secondary | ICD-10-CM | POA: Diagnosis not present

## 2019-10-07 DIAGNOSIS — E559 Vitamin D deficiency, unspecified: Secondary | ICD-10-CM

## 2019-10-07 DIAGNOSIS — M542 Cervicalgia: Secondary | ICD-10-CM

## 2019-10-07 DIAGNOSIS — Z96643 Presence of artificial hip joint, bilateral: Secondary | ICD-10-CM | POA: Diagnosis not present

## 2019-10-07 DIAGNOSIS — R7303 Prediabetes: Secondary | ICD-10-CM

## 2019-10-07 DIAGNOSIS — M79641 Pain in right hand: Secondary | ICD-10-CM | POA: Diagnosis not present

## 2019-10-07 DIAGNOSIS — G4709 Other insomnia: Secondary | ICD-10-CM

## 2019-10-07 DIAGNOSIS — M79642 Pain in left hand: Secondary | ICD-10-CM

## 2019-10-07 DIAGNOSIS — I10 Essential (primary) hypertension: Secondary | ICD-10-CM

## 2019-10-07 DIAGNOSIS — M19042 Primary osteoarthritis, left hand: Secondary | ICD-10-CM

## 2019-10-07 DIAGNOSIS — M21612 Bunion of left foot: Secondary | ICD-10-CM

## 2019-10-07 MED ORDER — LIDOCAINE HCL 1 % IJ SOLN
1.0000 mL | INTRAMUSCULAR | Status: AC | PRN
  Administered 2019-10-07: 1 mL

## 2019-10-07 MED ORDER — TRIAMCINOLONE ACETONIDE 40 MG/ML IJ SUSP
40.0000 mg | INTRAMUSCULAR | Status: AC | PRN
  Administered 2019-10-07: 40 mg via INTRA_ARTICULAR

## 2019-10-07 NOTE — Patient Instructions (Addendum)
Please get following labs to Dr. Venita Sheffield office:  CBC with differential, CMP with GFR, UA, ANA, Ro, La, RF, SPEP, C3-C4.  Please have th Please have labs faxed to our office at [phone] when available.    Shoulder Exercises Ask your health care provider which exercises are safe for you. Do exercises exactly as told by your health care provider and adjust them as directed. It is normal to feel mild stretching, pulling, tightness, or discomfort as you do these exercises. Stop right away if you feel sudden pain or your pain gets worse. Do not begin these exercises until told by your health care provider. Stretching exercises External rotation and abduction This exercise is sometimes called corner stretch. This exercise rotates your arm outward (external rotation) and moves your arm out from your body (abduction). 1. Stand in a doorway with one of your feet slightly in front of the other. This is called a staggered stance. If you cannot reach your forearms to the door frame, stand facing a corner of a room. 2. Choose one of the following positions as told by your health care provider: ? Place your hands and forearms on the door frame above your head. ? Place your hands and forearms on the door frame at the height of your head. ? Place your hands on the door frame at the height of your elbows. 3. Slowly move your weight onto your front foot until you feel a stretch across your chest and in the front of your shoulders. Keep your head and chest upright and keep your abdominal muscles tight. 4. Hold for __________ seconds. 5. To release the stretch, shift your weight to your back foot. Repeat __________ times. Complete this exercise __________ times a day. Extension, standing 1. Stand and hold a broomstick, a cane, or a similar object behind your back. ? Your hands should be a little wider than shoulder width apart. ? Your palms should face away from your back. 2. Keeping your elbows straight  and your shoulder muscles relaxed, move the stick away from your body until you feel a stretch in your shoulders (extension). ? Avoid shrugging your shoulders while you move the stick. Keep your shoulder blades tucked down toward the middle of your back. 3. Hold for __________ seconds. 4. Slowly return to the starting position. Repeat __________ times. Complete this exercise __________ times a day. Range-of-motion exercises Pendulum  1. Stand near a wall or a surface that you can hold onto for balance. 2. Bend at the waist and let your left / right arm hang straight down. Use your other arm to support you. Keep your back straight and do not lock your knees. 3. Relax your left / right arm and shoulder muscles, and move your hips and your trunk so your left / right arm swings freely. Your arm should swing because of the motion of your body, not because you are using your arm or shoulder muscles. 4. Keep moving your hips and trunk so your arm swings in the following directions, as told by your health care provider: ? Side to side. ? Forward and backward. ? In clockwise and counterclockwise circles. 5. Continue each motion for __________ seconds, or for as long as told by your health care provider. 6. Slowly return to the starting position. Repeat __________ times. Complete this exercise __________ times a day. Shoulder flexion, standing  1. Stand and hold a broomstick, a cane, or a similar object. Place your hands a little more than shoulder width  apart on the object. Your left / right hand should be palm up, and your other hand should be palm down. 2. Keep your elbow straight and your shoulder muscles relaxed. Push the stick up with your healthy arm to raise your left / right arm in front of your body, and then over your head until you feel a stretch in your shoulder (flexion). ? Avoid shrugging your shoulder while you raise your arm. Keep your shoulder blade tucked down toward the middle of your  back. 3. Hold for __________ seconds. 4. Slowly return to the starting position. Repeat __________ times. Complete this exercise __________ times a day. Shoulder abduction, standing 1. Stand and hold a broomstick, a cane, or a similar object. Place your hands a little more than shoulder width apart on the object. Your left / right hand should be palm up, and your other hand should be palm down. 2. Keep your elbow straight and your shoulder muscles relaxed. Push the object across your body toward your left / right side. Raise your left / right arm to the side of your body (abduction) until you feel a stretch in your shoulder. ? Do not raise your arm above shoulder height unless your health care provider tells you to do that. ? If directed, raise your arm over your head. ? Avoid shrugging your shoulder while you raise your arm. Keep your shoulder blade tucked down toward the middle of your back. 3. Hold for __________ seconds. 4. Slowly return to the starting position. Repeat __________ times. Complete this exercise __________ times a day. Internal rotation  1. Place your left / right hand behind your back, palm up. 2. Use your other hand to dangle an exercise band, a towel, or a similar object over your shoulder. Grasp the band with your left / right hand so you are holding on to both ends. 3. Gently pull up on the band until you feel a stretch in the front of your left / right shoulder. The movement of your arm toward the center of your body is called internal rotation. ? Avoid shrugging your shoulder while you raise your arm. Keep your shoulder blade tucked down toward the middle of your back. 4. Hold for __________ seconds. 5. Release the stretch by letting go of the band and lowering your hands. Repeat __________ times. Complete this exercise __________ times a day. Strengthening exercises External rotation  1. Sit in a stable chair without armrests. 2. Secure an exercise band to a  stable object at elbow height on your left / right side. 3. Place a soft object, such as a folded towel or a small pillow, between your left / right upper arm and your body to move your elbow about 4 inches (10 cm) away from your side. 4. Hold the end of the exercise band so it is tight and there is no slack. 5. Keeping your elbow pressed against the soft object, slowly move your forearm out, away from your abdomen (external rotation). Keep your body steady so only your forearm moves. 6. Hold for __________ seconds. 7. Slowly return to the starting position. Repeat __________ times. Complete this exercise __________ times a day. Shoulder abduction  1. Sit in a stable chair without armrests, or stand up. 2. Hold a __________ weight in your left / right hand, or hold an exercise band with both hands. 3. Start with your arms straight down and your left / right palm facing in, toward your body. 4. Slowly lift your  left / right hand out to your side (abduction). Do not lift your hand above shoulder height unless your health care provider tells you that this is safe. ? Keep your arms straight. ? Avoid shrugging your shoulder while you do this movement. Keep your shoulder blade tucked down toward the middle of your back. 5. Hold for __________ seconds. 6. Slowly lower your arm, and return to the starting position. Repeat __________ times. Complete this exercise __________ times a day. Shoulder extension 1. Sit in a stable chair without armrests, or stand up. 2. Secure an exercise band to a stable object in front of you so it is at shoulder height. 3. Hold one end of the exercise band in each hand. Your palms should face each other. 4. Straighten your elbows and lift your hands up to shoulder height. 5. Step back, away from the secured end of the exercise band, until the band is tight and there is no slack. 6. Squeeze your shoulder blades together as you pull your hands down to the sides of your  thighs (extension). Stop when your hands are straight down by your sides. Do not let your hands go behind your body. 7. Hold for __________ seconds. 8. Slowly return to the starting position. Repeat __________ times. Complete this exercise __________ times a day. Shoulder row 1. Sit in a stable chair without armrests, or stand up. 2. Secure an exercise band to a stable object in front of you so it is at waist height. 3. Hold one end of the exercise band in each hand. Position your palms so that your thumbs are facing the ceiling (neutral position). 4. Bend each of your elbows to a 90-degree angle (right angle) and keep your upper arms at your sides. 5. Step back until the band is tight and there is no slack. 6. Slowly pull your elbows back behind you. 7. Hold for __________ seconds. 8. Slowly return to the starting position. Repeat __________ times. Complete this exercise __________ times a day. Shoulder press-ups  1. Sit in a stable chair that has armrests. Sit upright, with your feet flat on the floor. 2. Put your hands on the armrests so your elbows are bent and your fingers are pointing forward. Your hands should be about even with the sides of your body. 3. Push down on the armrests and use your arms to lift yourself off the chair. Straighten your elbows and lift yourself up as much as you comfortably can. ? Move your shoulder blades down, and avoid letting your shoulders move up toward your ears. ? Keep your feet on the ground. As you get stronger, your feet should support less of your body weight as you lift yourself up. 4. Hold for __________ seconds. 5. Slowly lower yourself back into the chair. Repeat __________ times. Complete this exercise __________ times a day. Wall push-ups  1. Stand so you are facing a stable wall. Your feet should be about one arm-length away from the wall. 2. Lean forward and place your palms on the wall at shoulder height. 3. Keep your feet flat on the  floor as you bend your elbows and lean forward toward the wall. 4. Hold for __________ seconds. 5. Straighten your elbows to push yourself back to the starting position. Repeat __________ times. Complete this exercise __________ times a day. This information is not intended to replace advice given to you by your health care provider. Make sure you discuss any questions you have with your health care provider.  Document Revised: 10/31/2018 Document Reviewed: 08/08/2018 [employer] Patient Education  2020 [employer] Cervical Strain and Sprain Rehab Ask your health care provider which exercises are safe for you. Do exercises exactly as told by your health care provider and adjust them as directed. It is normal to feel mild stretching, pulling, tightness, or discomfort as you do these exercises. Stop right away if you feel sudden pain or your pain gets worse. Do not begin these exercises until told by your health care provider. Stretching and range-of-motion exercises Cervical side bending  6. Using good posture, sit on a stable chair or stand up. 7. Without moving your shoulders, slowly tilt your left / right ear to your shoulder until you feel a stretch in the opposite side neck muscles. You should be looking straight ahead. 8. Hold for __________ seconds. 9. Repeat with the other side of your neck. Repeat __________ times. Complete this exercise __________ times a day. Cervical rotation  5. Using good posture, sit on a stable chair or stand up. 6. Slowly turn your head to the side as if you are looking over your left / right shoulder. ? Keep your eyes level with the ground. ? Stop when you feel a stretch along the side and the back of your neck. 7. Hold for __________ seconds. 8. Repeat this by turning to your other side. Repeat __________ times. Complete this exercise __________ times a day. Thoracic extension and pectoral stretch 7. Roll a towel or a small blanket so it is about 4 inches  (10 cm) in diameter. 8. Lie down on your back on a firm surface. 9. Put the towel lengthwise, under your spine in the middle of your back. It should not be under your shoulder blades. The towel should line up with your spine from your middle back to your lower back. 10. Put your hands behind your head and let your elbows fall out to your sides. 11. Hold for __________ seconds. Repeat __________ times. Complete this exercise __________ times a day. Strengthening exercises Isometric upper cervical flexion 5. Lie on your back with a thin pillow behind your head and a small rolled-up towel under your neck. 6. Gently tuck your chin toward your chest and nod your head down to look toward your feet. Do not lift your head off the pillow. 7. Hold for __________ seconds. 8. Release the tension slowly. Relax your neck muscles completely before you repeat this exercise. Repeat __________ times. Complete this exercise __________ times a day. Isometric cervical extension  5. Stand about 6 inches (15 cm) away from a wall, with your back facing the wall. 6. Place a soft object, about 6-8 inches (15-20 cm) in diameter, between the back of your head and the wall. A soft object could be a small pillow, a ball, or a folded towel. 7. Gently tilt your head back and press into the soft object. Keep your jaw and forehead relaxed. 8. Hold for __________ seconds. 9. Release the tension slowly. Relax your neck muscles completely before you repeat this exercise. Repeat __________ times. Complete this exercise __________ times a day. Posture and body mechanics Body mechanics refers to the movements and positions of your body while you do your daily activities. Posture is part of body mechanics. Good posture and healthy body mechanics can help to relieve stress in your body's tissues and joints. Good posture means that your spine is in its natural S-curve position (your spine is neutral), your shoulders are pulled back  slightly, and  your head is not tipped forward. The following are general guidelines for applying improved posture and body mechanics to your everyday activities. Sitting  6. When sitting, keep your spine neutral and keep your feet flat on the floor. Use a footrest, if necessary, and keep your thighs parallel to the floor. Avoid rounding your shoulders, and avoid tilting your head forward. 7. When working at a desk or a computer, keep your desk at a height where your hands are slightly lower than your elbows. Slide your chair under your desk so you are close enough to maintain good posture. 8. When working at a computer, place your monitor at a height where you are looking straight ahead and you do not have to tilt your head forward or downward to look at the screen. Standing   When standing, keep your spine neutral and keep your feet about hip-width apart. Keep a slight bend in your knees. Your ears, shoulders, and hips should line up.  When you do a task in which you stand in one place for a long time, place one foot up on a stable object that is 2-4 inches (5-10 cm) high, such as a footstool. This helps keep your spine neutral. Resting When lying down and resting, avoid positions that are most painful for you. Try to support your neck in a neutral position. You can use a contour pillow or a small rolled-up towel. Your pillow should support your neck but not push on it. This information is not intended to replace advice given to you by your health care provider. Make sure you discuss any questions you have with your health care provider. Document Revised: 10/29/2018 Document Reviewed: 04/09/2018 [employer] Patient Education  [place].

## 2019-10-12 DIAGNOSIS — H2513 Age-related nuclear cataract, bilateral: Secondary | ICD-10-CM | POA: Diagnosis not present

## 2019-10-12 DIAGNOSIS — H35033 Hypertensive retinopathy, bilateral: Secondary | ICD-10-CM | POA: Diagnosis not present

## 2019-10-12 DIAGNOSIS — H04123 Dry eye syndrome of bilateral lacrimal glands: Secondary | ICD-10-CM | POA: Diagnosis not present

## 2019-10-16 DIAGNOSIS — M25562 Pain in left knee: Secondary | ICD-10-CM | POA: Diagnosis not present

## 2019-10-16 DIAGNOSIS — N3941 Urge incontinence: Secondary | ICD-10-CM | POA: Diagnosis not present

## 2019-10-16 DIAGNOSIS — N3281 Overactive bladder: Secondary | ICD-10-CM | POA: Diagnosis not present

## 2019-10-16 DIAGNOSIS — Z79890 Hormone replacement therapy (postmenopausal): Secondary | ICD-10-CM | POA: Diagnosis not present

## 2019-10-16 DIAGNOSIS — N952 Postmenopausal atrophic vaginitis: Secondary | ICD-10-CM | POA: Diagnosis not present

## 2019-10-26 ENCOUNTER — Other Ambulatory Visit: Payer: Self-pay

## 2019-10-26 ENCOUNTER — Ambulatory Visit (INDEPENDENT_AMBULATORY_CARE_PROVIDER_SITE_OTHER): Payer: Medicare PPO | Admitting: Internal Medicine

## 2019-10-26 ENCOUNTER — Encounter: Payer: Self-pay | Admitting: Internal Medicine

## 2019-10-26 VITALS — BP 126/84 | HR 92 | Temp 96.8°F | Ht 65.0 in | Wt 216.0 lb

## 2019-10-26 DIAGNOSIS — E782 Mixed hyperlipidemia: Secondary | ICD-10-CM | POA: Diagnosis not present

## 2019-10-26 DIAGNOSIS — Z01810 Encounter for preprocedural cardiovascular examination: Secondary | ICD-10-CM

## 2019-10-26 NOTE — Progress Notes (Signed)
OFFICE NOTE  Chief Complaint:  Preoperative clearance  Primary Care Physician: Wenda Low, MD  HPI:  Kathy Duran is a pleasant 74 year old female with a history of premature arthritis, hypertension, dyslipidemia, gout bladder spasm, GERD and low back pain. She's also had right hip surgery.  She is a self-referral for evaluation of lower stringy swelling. She previously seen Dr. Duke Salvia in 2007 in May that a stress test at that time. She's managed to use some compression stockings for her swelling which worked. She reports the swelling is gotten worse during the day which is on her feet but improves after she elevates it at night. She's had a limited workup of her swelling before occluding venous Dopplers which showed no evidence of a DVT in 2011. She denies any varicose veins. She has been having some problems with sores on her feet. She is seeing Dr. Jerrilyn Cairo [facility]. She denies any shortness of breath or chest pain. She has no orthopnea or PND.  Kathy Duran returns today for followup of her venous Dopplers. This did not show any evidence for venous insufficiency. There was no evidence for deep vein thrombosis. She continues to have some swelling mostly in her left foot. She is now back in a walking boot. Her renal function is normal and BNP was low indicating this is not likely heart failure. 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 In fact in her right hand it is so significant particularly toward the wrist that she is undergoing carpal tunnel surgery later this week.  Kathy Duran he was seen back in the office today in follow-up. Overall she seems to be doing fairly well although continues to have concerns about leg swelling. At times she reports some swelling in her legs including today. She's had venous Dopplers last year which failed to show any venous insufficiency. She's concerned that  there may be a cardiac etiology of this. She denies any significant shortness of breath, orthopnea or PND.  I had the pleasure seeing Kathy Duran back in the office today for follow-up. She underwent an echocardiogram which showed normal systolic function mild diastolic dysfunction, but no clear etiology for her lower extremity swelling. She says she's been using compression stockings with a significant improvement in her symptoms.  12/06/2016  Kathy Duran returns today for follow-up. I last saw her about 2 years ago. She recently had blood work from Dr. Deforest Hoyles with the goal and will obtain those labs. Over the past several years she's done fairly well denies any worsening shortness of breath, chest pain or associated symptoms. She still struggles with some swelling of her legs which I suspect his lymphedema. We have done previously ultrasounds of her legs without any clear evidence for venous insufficiency. She does have some improvement with compression stockings but finds them intolerable to wear when it's hot out. Blood pressure is at goal today.  10/26/2019  Kathy Duran is seen today for preoperative clearance.  I last saw her back a few years ago.  She overall has been doing fairly well but struggling with knee pain.  She is desiring left total knee replacement.  She denies any new chest pain or worsening shortness of breath.  She did have labs in 2020 which showed an LDL cholesterol 126.  She is on atorvastatin 40 mg.  Her HDL is actually 83.  No known coronary disease.  She does have arthritis  which I thought was rheumatoid but might also be osteoarthritis.  She is followed by Dr. Erlene Senters.  EKG is normal sinus rhythm.  Is on no blood thinners.  PMHx:  Past Medical History:  Diagnosis Date  . Anxiety   . Arthritis    left hip  . Asthma    Inhalers for tx  . Bilateral primary osteoarthritis of knee 07/06/2016   Moderate  . Bilateral swelling of feet    bilateral ankle and feet edema  . Bruises  easily   . DDD (degenerative disc disease), lumbar 07/06/2016  . Hypertension   . Myofacial muscle pain 07/06/2016  . Osteoarthritis of both hands 07/06/2016  . Seasonal allergies 07/06/2016  . Sinus complaint   . Sjogren's syndrome (H[facility])    dry eyes  . Tenonitis   . Urinary incontinence    occ. an issue wears pads    Past Surgical History:  Procedure Laterality Date  . ABDOMINAL HYSTERECTOMY  1986  . BACK SURGERY  1985  . BREAST CYST ASPIRATION    . BREAST SURGERY Right    cyst drainage  . CARPAL TUNNEL RELEASE Right 2001  . KNEE SURGERY  1991  . SHOULDER SURGERY Right    right shoulder tendon and rotator cuff repair  . TOTAL HIP ARTHROPLASTY Right 2006  . TOTAL HIP ARTHROPLASTY Left 02/24/2016   Procedure: LEFT TOTAL HIP ARTHROPLASTY ANTERIOR APPROACH;  Surgeon: Kathryne Hitch, MD;  Location: [facility];  Service: Orthopedics;  Laterality: Left;    FAMHx:  Family History  Problem Relation Age of Onset  . Arrhythmia Mother   . Stroke Mother   . Hyperlipidemia Mother   . Diabetes Mother   . Hypertension Mother   . Heart attack Father   . Diabetes Father   . Cancer Father   . Hypertension Father   . Hyperlipidemia Maternal Grandmother   . Hypertension Maternal Grandmother   . Heart attack Paternal Grandfather   . Stroke Paternal Grandfather   . Lung disease Brother   . Diabetes Sister   . Dementia Sister   . Allergies Daughter   . Healthy Daughter     SOCHx:   reports that she quit smoking about 35 years ago. She has never used smokeless tobacco. She reports that she does not drink alcohol or use drugs.  ALLERGIES:  Allergies  Allergen Reactions  . Amoxicillin-Pot Clavulanate Rash  . Diclofenac-Misoprostol Itching  . Latex Itching  . Naproxen Nausea Only    ROS: Pertinent items noted in HPI and remainder of comprehensive ROS otherwise negative.  HOME MEDS: Current Outpatient Medications  Medication Sig Dispense Refill  . albuterol (PROVENTIL)  (2.5 MG/3ML) 0.083% nebulizer solution Take 2.5 mg by nebulization as needed for wheezing.    Marland Kitchen aspirin 81 MG chewable tablet Chew by mouth.    Marland Kitchen atorvastatin (LIPITOR) 40 MG tablet TAKE 1 TABLET BY MOUTH EVERY DAY 90 tablet 3  . cetirizine (ZYRTEC) 10 MG chewable tablet Chew 10 mg by mouth daily.    . Cholecalciferol (VITAMIN D-3 PO) Take 1 tablet by mouth daily.     . cyclobenzaprine (FLEXERIL) 10 MG tablet TAKE 1 TABLET BY MOUTH TWICE A DAY AS NEEDED FOR MUSCLE SPASM 40 tablet 1  . estradiol (VIVELLE-DOT) 0.1 MG/24HR patch Place 1 patch onto the skin 2 (two) times a week.    . Fluticasone-Salmeterol (ADVAIR) 100-50 MCG/DOSE AEPB Inhale 1 puff into the lungs daily.     . Homeopathic Products (ARNICARE EX) Apply 1  application topically daily as needed (pain).    Marland Kitchen ipratropium (ATROVENT) 0.06 % nasal spray Place 2 sprays into both nostrils 4 (four) times daily. 15 mL 12  . losartan (COZAAR) 50 MG tablet Take 50 mg by mouth daily.    . mometasone (NASONEX) 50 MCG/ACT nasal spray Place 2 sprays into the nose daily.    . Multiple Vitamin (MULTIVITAMIN) capsule Take 1 capsule by mouth daily.    . ondansetron (ZOFRAN-ODT) 8 MG disintegrating tablet     . pantoprazole (PROTONIX) 40 MG tablet Take 40 mg by mouth daily as needed (reflux).     . RESTASIS 0.05 % ophthalmic emulsion Place 1 drop into both eyes 2 (two) times daily.    Marland Kitchen zolpidem (AMBIEN) 10 MG tablet Take 1 tablet by mouth at bedtime as needed for sleep.     . meloxicam (MOBIC) 7.5 MG tablet Take 1 tablet (7.5 mg total) by mouth 2 (two) times daily. (Patient not taking: Reported on 10/26/2019) 60 tablet 1   No current facility-administered medications for this visit.    LABS/IMAGING: No results found for this or any previous visit (from the past 48 hour(s)). No results found.  VITALS: BP 126/84 (BP Location: Left Arm, Patient Position: Sitting, Cuff Size: Large)   Pulse 92   Temp (!) 96.8 F (36 C)   Ht 5\' 5"  (1.651 m)   Wt 216 lb  (98 kg)   BMI 35.94 kg/m   EXAM: General appearance: alert and no distress Neck: no carotid bruit and no JVD Lungs: clear to auscultation bilaterally Heart: regular rate and rhythm, S1, S2 normal, no murmur, click, rub or gallop Abdomen: soft, non-tender; bowel sounds normal; no masses,  no organomegaly Extremities: edema Trace bilateral Pulses: 2+ and symmetric Skin: Skin color, texture, turgor normal. No rashes or lesions Neurologic: Grossly normal Psych: Pleasant  EKG: Normal sinus rhythm at 73-personally reviewed  ASSESSMENT: 1. Acceptable risk for upcoming surgery 2. History of lipidema 3. Hypertension 4. Dyslipidemia 5. Rheumatoid arthritis  PLAN: 1.   Ms. Altemose is doing well and denies any chest pain or worsening shortness of breath.  Blood pressures well controlled.  EKG is normal.  She is desiring a knee replacement I think is at acceptable risk for that.  No further testing is necessary at this time.  She can follow-up with me in a year at which time she is likely going to need replacement of her other knee.Thurmon Fair  Marland Kitchen, MD, [facility], FACP  [place]  Medical Director of the [facility]  Card[facility]ovascular [facility] Clinic Diplomate of the American Board of Clinical Lipidology Attending Cardiologist  Direct Dial: [phone]  Fax: [phone]  Website:  [email or web address]  [phone] Yarielis Funaro 10/26/2019, 10:20 AM

## 2019-10-26 NOTE — Patient Instructions (Signed)
Medication Instructions:  Your physician recommends that you continue on your current medications as directed. Please refer to the Current Medication list given to you today.  *If you need a refill on your cardiac medications before your next appointment, please call your pharmacy*  Lab Work: NONE ordered at this time of appointment   If you have labs (blood work) drawn today and your tests are completely normal, you will receive your results only by: Marland Kitchen MyChart Message (if you have MyChart) OR . A paper copy in the mail If you have any lab test that is abnormal or we need to change your treatment, we will call you to review the results.  Testing/Procedures: NONE ordered at this time of appointment   Follow-Up: At [facility], you and your health needs are our priority.  As part of our continuing mission to provide you with exceptional heart care, we have created designated Provider Care Teams.  These Care Teams include your primary Cardiologist (physician) and Advanced Practice Providers (APPs -  Physician Assistants and Nurse Practitioners) who all work together to provide you with the care you need, when you need it.  We recommend signing up for the patient portal called "MyChart".  Sign up information is provided on this After Visit Summary.  MyChart is used to connect with patients for Virtual Visits (Telemedicine).  Patients are able to view lab/test results, encounter notes, upcoming appointments, etc.  Non-urgent messages can be sent to your provider as well.   To learn more about what you can do with MyChart, go to [email or web address].    Your next appointment:   1 year(s)  The format for your next appointment:   In Person  Provider:   You may see Chrystie Nose, MD or one of the following Advanced Practice Providers on your designated Care Team:    Azalee Course, PA-C  Micah Flesher, PA-C or   Judy Pimple, [place]  Other Instructions

## 2019-10-27 NOTE — H&P (Signed)
TOTAL KNEE ADMISSION H&P  Patient is being admitted for left total knee arthroplasty.  Subjective:   Chief Complaint: Left knee pain.  HPI: Kathy Duran, 74 y.o. female has a history of pain and functional disability in the left knee due to arthritis and has failed non-surgical conservative treatments for greater than 12 weeks to include corticosteriod injections, viscosupplementation injections and activity modification. Onset of symptoms was gradual, starting 2 years ago with gradually worsening course since that time. The patient noted no past surgery on the left knee.  Patient currently rates pain in the left knee at 9 out of 10 with activity. Patient has worsening of pain with activity and weight bearing, pain that interferes with activities of daily living, crepitus and joint swelling. Patient has evidence of near bone-on-bone in all three compartments on the left, with significant joint space narrowing by imaging studies. There is no active infection.  Patient Active Problem List   Diagnosis Date Noted  . Unilateral primary osteoarthritis, left knee 05/25/2019  . Trochanteric bursitis, left hip 03/07/2017  . Lymphedema 12/06/2016  . Bilateral bunions 08/21/2016  . Seasonal allergies 07/06/2016  . Asthma 07/06/2016  . Sjogren's syndrome ([place]) 07/06/2016  . Bilateral primary osteoarthritis of knee 07/06/2016  . Osteoarthritis of both hands 07/06/2016  . Myofacial muscle pain 07/06/2016  . DDD lumbar spine with spinal stenosis 07/06/2016  . Abnormality of gait 06/26/2016  . Chronic pain of right ankle 06/26/2016  . H/O bilateral hip replacements 02/24/2016  . Varicose veins 04/20/2015  . Lipidemia 04/20/2015  . Rheumatoid factor positive 11/20/2013  . Back pain with left-sided sciatica 11/20/2013  . HTN (hypertension) 11/20/2013  . GERD (gastroesophageal reflux disease) 11/20/2013  . Bladder spasm 11/20/2013  . Hyperlipidemia 11/20/2013  . Bilateral swelling of feet   .  Bruises easily     Past Medical History:  Diagnosis Date  . Anxiety   . Arthritis    left hip  . Asthma    Inhalers for tx  . Bilateral primary osteoarthritis of knee 07/06/2016   Moderate  . Bilateral swelling of feet    bilateral ankle and feet edema  . Bruises easily   . DDD (degenerative disc disease), lumbar 07/06/2016  . Hypertension   . Myofacial muscle pain 07/06/2016  . Osteoarthritis of both hands 07/06/2016  . Seasonal allergies 07/06/2016  . Sinus complaint   . Sjogren's syndrome ([place])    dry eyes  . Tenonitis   . Urinary incontinence    occ. an issue wears pads    Past Surgical History:  Procedure Laterality Date  . ABDOMINAL HYSTERECTOMY  1986  . BACK SURGERY  1985  . BREAST CYST ASPIRATION    . BREAST SURGERY Right    cyst drainage  . CARPAL TUNNEL RELEASE Right 2001  . KNEE SURGERY  1991  . SHOULDER SURGERY Right    right shoulder tendon and rotator cuff repair  . TOTAL HIP ARTHROPLASTY Right 2006  . TOTAL HIP ARTHROPLASTY Left 02/24/2016   Procedure: LEFT TOTAL HIP ARTHROPLASTY ANTERIOR APPROACH;  Surgeon: Mcarthur Rossetti, MD;  Location: [facility];  Service: Orthopedics;  Laterality: Left;    Prior to Admission medications   Medication Sig Start Date End Date Taking? Authorizing Provider  albuterol (PROVENTIL) (2.5 MG/3ML) 0.083% nebulizer solution Take 2.5 mg by nebulization as needed for wheezing.    [provider]  aspirin 81 MG chewable tablet Chew by mouth.    [provider]  atorvastatin (LIPITOR) 40  MG tablet TAKE 1 TABLET BY MOUTH EVERY DAY 01/30/17   Hilty, Lisette Abu, MD  cetirizine (ZYRTEC) 10 MG chewable tablet Chew 10 mg by mouth daily.    [provider]  Cholecalciferol (VITAMIN D-3 PO) Take 1 tablet by mouth daily.     [provider]  cyclobenzaprine (FLEXERIL) 10 MG tablet TAKE 1 TABLET BY MOUTH TWICE A DAY AS NEEDED FOR MUSCLE SPASM 06/02/19   Kathryne Hitch, MD  estradiol  (VIVELLE-DOT) 0.1 MG/24HR patch Place 1 patch onto the skin 2 (two) times a week.    [provider]  Fluticasone-Salmeterol (ADVAIR) 100-50 MCG/DOSE AEPB Inhale 1 puff into the lungs daily.     [provider]  Homeopathic Products (ARNICARE EX) Apply 1 application topically daily as needed (pain).    [provider]  ipratropium (ATROVENT) 0.06 % nasal spray Place 2 sprays into both nostrils 4 (four) times daily. 10/22/14   Ozella Rocks, MD  losartan (COZAAR) 50 MG tablet Take 50 mg by mouth daily.    [provider]  meloxicam (MOBIC) 7.5 MG tablet Take 1 tablet (7.5 mg total) by mouth 2 (two) times daily. Patient not taking: Reported on 10/26/2019 09/03/18   Kirtland Bouchard, PA-C  mometasone (NASONEX) 50 MCG/ACT nasal spray Place 2 sprays into the nose daily.    [provider]  Multiple Vitamin (MULTIVITAMIN) capsule Take 1 capsule by mouth daily.    [provider]  ondansetron (ZOFRAN-ODT) 8 MG disintegrating tablet  10/12/16   [provider]  pantoprazole (PROTONIX) 40 MG tablet Take 40 mg by mouth daily as needed (reflux).     [provider]  RESTASIS 0.05 % ophthalmic emulsion Place 1 drop into both eyes 2 (two) times daily. 04/30/15   [provider]  zolpidem (AMBIEN) 10 MG tablet Take 1 tablet by mouth at bedtime as needed for sleep.  11/16/13   [provider]    Allergies  Allergen Reactions  . Amoxicillin-Pot Clavulanate Rash  . Diclofenac-Misoprostol Itching  . Latex Itching  . Naproxen Nausea Only    Social History   Socioeconomic History  . Marital status: Married    Spouse name: Not on file  . Number of children: 1  . Years of education: Not on file  . Highest education level: Not on file  Occupational History  . Not on file  Tobacco Use  . Smoking status: Former Smoker    Quit date: 11/21/1983    Years since quitting: 35.9  . Smokeless tobacco: Never Used  Substance and  Sexual Activity  . Alcohol use: No  . Drug use: No  . Sexual activity: Yes  Other Topics Concern  . Not on file  Social History Narrative  . Not on file   Social Determinants of Health   Financial Resource Strain:   . Difficulty of Paying Living Expenses:   Food Insecurity:   . Worried About Programme researcher, broadcasting/film/video in the Last Year:   . Barista in the Last Year:   Transportation Needs:   . Freight forwarder (Medical):   Marland Kitchen Lack of Transportation (Non-Medical):   Physical Activity:   . Days of Exercise per Week:   . Minutes of Exercise per Session:   Stress:   . Feeling of Stress :   Social Connections:   . Frequency of Communication with Friends and Family:   . Frequency of Social Gatherings with Friends and Family:   .  Attends Religious Services:   . Active Member of Clubs or Organizations:   . Attends Banker Meetings:   Marland Kitchen Marital Status:   Intimate Partner Violence:   . Fear of Current or Ex-Partner:   . Emotionally Abused:   Marland Kitchen Physically Abused:   . Sexually Abused:       Tobacco Use: Medium Risk  . Smoking Tobacco Use: Former Smoker  . Smokeless Tobacco Use: Never Used   Social History   Substance and Sexual Activity  Alcohol Use No    Family History  Problem Relation Age of Onset  . Arrhythmia Mother   . Stroke Mother   . Hyperlipidemia Mother   . Diabetes Mother   . Hypertension Mother   . Heart attack Father   . Diabetes Father   . Cancer Father   . Hypertension Father   . Hyperlipidemia Maternal Grandmother   . Hypertension Maternal Grandmother   . Heart attack Paternal Grandfather   . Stroke Paternal Grandfather   . Lung disease Brother   . Diabetes Sister   . Dementia Sister   . Allergies Daughter   . Healthy Daughter     Review of Systems  Constitutional: Negative for chills and fever.  HENT: Negative for congestion, sore throat and tinnitus.   Eyes: Negative for double vision, photophobia and pain.    Respiratory: Negative for cough, shortness of breath and wheezing.   Cardiovascular: Negative for chest pain, palpitations and orthopnea.  Gastrointestinal: Negative for heartburn, nausea and vomiting.  Genitourinary: Negative for dysuria, frequency and urgency.  Musculoskeletal: Positive for joint pain.  Neurological: Negative for dizziness, weakness and headaches.   Objective:  Physical Exam: Well nourished and well developed.  General: Alert and oriented x3, cooperative and pleasant, no acute distress.  Head: normocephalic, atraumatic, neck supple.  Eyes: EOMI.  Respiratory: breath sounds clear in all fields, no wheezing, rales, or rhonchi. Cardiovascular: Regular rate and rhythm, no murmurs, gallops or rubs.  Abdomen: non-tender to palpation and soft, normoactive bowel sounds. Musculoskeletal:  Left Knee Exam:  No effusion present. No swelling present. The range of motion is: 10 to 90 degrees.  Moderate crepitus on range of motion of the knee.  Positive for medial joint line tenderness. Positive for lateral joint line tenderness.  The knee is stable.  Calves soft and nontender. Motor function intact in LE. Strength 5/5 LE bilaterally. Neuro: Distal pulses 2+. Sensation to light touch intact in LE.  Vital signs in last 24 hours:  Blood pressure: 142/84 mmHg  Imaging Review Plain radiographs demonstrate severe degenerative joint disease of the left knee. The overall alignment is neutral. The bone quality appears to be adequate for age and reported activity level.  Assessment/Plan:  End stage arthritis, left knee   The patient history, physical examination, clinical judgment of the provider and imaging studies are consistent with end stage degenerative joint disease of the left knee and total knee arthroplasty is deemed medically necessary. The treatment options including medical management, injection therapy arthroscopy and arthroplasty were discussed at length. The risks  and benefits of total knee arthroplasty were presented and reviewed. The risks due to aseptic loosening, infection, stiffness, patella tracking problems, thromboembolic complications and other imponderables were discussed. The patient acknowledged the explanation, agreed to proceed with the plan and consent was signed. Patient is being admitted for inpatient treatment for surgery, pain control, PT, OT, prophylactic antibiotics, VTE prophylaxis, progressive ambulation and ADLs and discharge planning. The patient is planning to be  discharged home.   Patient's anticipated LOS is less than 2 midnights, meeting these requirements: - Younger than 30 - Lives within 1 hour of care - Has a competent adult at home to recover with post-op recover - NO history of  - Chronic pain requiring opiods  - Diabetes  - Coronary Artery Disease  - Heart failure  - Heart attack  - Stroke  - DVT/VTE  - Cardiac arrhythmia  - Respiratory Failure/COPD  - Renal failure  - Anemia  - Advanced Liver disease  Therapy Plans: Outpatient therapy at [facility] Disposition: Home with husband and daughter Planned DVT Prophylaxis: Aspirin 325 mg BID DME Needed: None PCP: Georgann Housekeeper, MD (appointment on 04/12) Cardiologist: Zoila Shutter, MD (had appointment on 4/5) TXA: IV Allergies: LATEX, adhesives, sulfa Anesthesia Concerns: Low oxygen saturation following surgery BMI: 35.8 Other: SDD  - Patient was instructed on what medications to stop prior to surgery. - Follow-up visit in 2 weeks with Dr. Lequita Halt - Begin physical therapy following surgery - Pre-operative lab work as pre-surgical testing - Prescriptions will be provided in hospital at time of discharge  Arther Abbott, PA-C Orthopedic Surgery [facility] [place]

## 2019-11-02 ENCOUNTER — Other Ambulatory Visit (INDEPENDENT_AMBULATORY_CARE_PROVIDER_SITE_OTHER): Payer: Self-pay | Admitting: Orthopaedic Surgery

## 2019-11-02 NOTE — Telephone Encounter (Signed)
Please advise 

## 2019-11-04 ENCOUNTER — Encounter (HOSPITAL_COMMUNITY): Payer: Self-pay

## 2019-11-04 DIAGNOSIS — E559 Vitamin D deficiency, unspecified: Secondary | ICD-10-CM | POA: Diagnosis not present

## 2019-11-04 DIAGNOSIS — Z0000 Encounter for general adult medical examination without abnormal findings: Secondary | ICD-10-CM | POA: Diagnosis not present

## 2019-11-04 DIAGNOSIS — M3500 Sicca syndrome, unspecified: Secondary | ICD-10-CM | POA: Diagnosis not present

## 2019-11-04 DIAGNOSIS — E782 Mixed hyperlipidemia: Secondary | ICD-10-CM | POA: Diagnosis not present

## 2019-11-04 DIAGNOSIS — J45909 Unspecified asthma, uncomplicated: Secondary | ICD-10-CM | POA: Diagnosis not present

## 2019-11-04 DIAGNOSIS — R7309 Other abnormal glucose: Secondary | ICD-10-CM | POA: Diagnosis not present

## 2019-11-04 DIAGNOSIS — Z1389 Encounter for screening for other disorder: Secondary | ICD-10-CM | POA: Diagnosis not present

## 2019-11-04 DIAGNOSIS — I10 Essential (primary) hypertension: Secondary | ICD-10-CM | POA: Diagnosis not present

## 2019-11-04 DIAGNOSIS — M810 Age-related osteoporosis without current pathological fracture: Secondary | ICD-10-CM | POA: Diagnosis not present

## 2019-11-04 NOTE — Patient Instructions (Addendum)
DUE TO COVID-19 ONLY ONE VISITOR IS ALLOWED TO COME WITH YOU AND STAY IN THE WAITING ROOM ONLY DURING PRE OP AND PROCEDURE DAY OF SURGERY. TWO VISITOR MAY VISIT WITH YOU AFTER SURGERY IN YOUR PRIVATE ROOM DURING VISITING HOURS ONLY!   10-a8p  YOU NEED TO HAVE A COVID 19 TEST ON___4-22-21____ @_1 :30______, THIS TEST MUST BE DONE BEFORE SURGERY, COME  [address], [place] , .  [facility]) ONCE YOUR COVID TEST IS COMPLETED, PLEASE BEGIN THE QUARANTINE INSTRUCTIONS AS OUTLINED IN YOUR HANDOUT.                Kathy Duran  11/04/2019   Your procedure is scheduled on: 11-16-19   Report to [facility] Main  Entrance   Report to admitting at     0550  AM     Call this number if you have problems the morning of surgery [phone]    Remember: NO SOLID FOOD AFTER MIDNIGHT THE NIGHT PRIOR TO SURGERY. NOTHING BY MOUTH EXCEPT CLEAR LIQUIDS UNTIL   0520am . PLEASE FINISH ENSURE DRINK PER SURGEON ORDER  WHICH NEEDS TO BE COMPLETED AT      0520 am then nothing by mouth .    CLEAR LIQUID DIET   Foods Allowed                                                                                     Foods Excluded  Coffee and tea, regular and decaf NO CREAMER                            liquids that you cannot  Plain Jell-O any favor except red or purple                                           see through such as: Fruit ices (not with fruit pulp)                                                           milk, soups, orange juice  Iced Popsicles                                                           All solid food Carbonated beverages, regular and diet                                    Cranberry, grape and apple juices Sports drinks like Gatorade Lightly seasoned clear broth or consume(fat free) Sugar, honey syrup   _____________________________________________________________________   07[birth year]1998  YOUR TEETH MORNING OF SURGERY AND RINSE YOUR MOUTH OUT, NO  CHEWING GUM CANDY OR MINTS.     Take these medicines the morning of surgery with A SIP OF WATER: eye drops, protonix, inhalers and bring with you, zyrtec, flexeril if needed, use nebulizer if needed                                 You may not have any metal on your body including hair pins and              piercings  Do not wear jewelry, make-up, lotions, powders or perfumes, deodorant             Do not wear nail polish on your fingernails.  Do not shave  48 hours prior to surgery.     Do not bring valuables to the hospital. [place].  Contacts, dentures or bridgework may not be worn into surgery.  Leave suitcase in the car. After surgery it may be brought to your room.     Patients discharged the day of surgery will not be allowed to drive home. IF YOU ARE HAVING SURGERY AND GOING HOME THE SAME DAY, YOU MUST HAVE AN ADULT TO DRIVE YOU HOME AND BE WITH YOU FOR 24 HOURS. YOU MAY GO HOME BY TAXI OR UBER OR ORTHERWISE, BUT AN ADULT MUST ACCOMPANY YOU HOME AND STAY WITH YOU FOR 24 HOURS.  Name and phone number of your driver:  Special Instructions: N/A              Please read over the following fact sheets you were given: _____________________________________________________________________             [facility] - Preparing for Surgery Before surgery, you can play an important role.  Because skin is not sterile, your skin needs to be as free of germs as possible.  You can reduce the number of germs on your skin by washing with CHG (chlorahexidine gluconate) soap before surgery.  CHG is an antiseptic cleaner which kills germs and bonds with the skin to continue killing germs even after washing. Please DO NOT use if you have an allergy to CHG or antibacterial soaps.  If your skin becomes reddened/irritated stop using the CHG and inform your nurse when you arrive at Short Stay. Do not shave (including legs and underarms) for at least 48  hours prior to the first CHG shower.  You may shave your face/neck. Please follow these instructions carefully:  1.  Shower with CHG Soap the night before surgery and the  morning of Surgery.  2.  If you choose to wash your hair, wash your hair first as usual with your  normal  shampoo.  3.  After you shampoo, rinse your hair and body thoroughly to remove the  shampoo.                           4.  Use CHG as you would any other liquid soap.  You can apply chg directly  to the skin and wash                       Gently with a scrungie or clean washcloth.  5.  Apply the CHG Soap  to your body ONLY FROM THE NECK DOWN.   Do not use on face/ open                           Wound or open sores. Avoid contact with eyes, ears mouth and genitals (private parts).                       Wash face,  Genitals (private parts) with your normal soap.             6.  Wash thoroughly, paying special attention to the area where your surgery  will be performed.  7.  Thoroughly rinse your body with warm water from the neck down.  8.  DO NOT shower/wash with your normal soap after using and rinsing off  the CHG Soap.                9.  Pat yourself dry with a clean towel.            10.  Wear clean pajamas.            11.  Place clean sheets on your bed the night of your first shower and do not  sleep with pets. Day of Surgery : Do not apply any lotions/deodorants the morning of surgery.  Please wear clean clothes to the hospital/surgery center.  FAILURE TO FOLLOW THESE INSTRUCTIONS MAY RESULT IN THE CANCELLATION OF YOUR SURGERY PATIENT SIGNATURE_________________________________  NURSE SIGNATURE__________________________________  ________________________________________________________________________   Adam Phenix  An incentive spirometer is a tool that can help keep your lungs clear and active. This tool measures how well you are filling your lungs with each breath. Taking long deep breaths may help  reverse or decrease the chance of developing breathing (pulmonary) problems (especially infection) following:  A long period of time when you are unable to move or be active. BEFORE THE PROCEDURE   If the spirometer includes an indicator to show your best effort, your nurse or respiratory therapist will set it to a desired goal.  If possible, sit up straight or lean slightly forward. Try not to slouch.  Hold the incentive spirometer in an upright position. INSTRUCTIONS FOR USE  1. Sit on the edge of your bed if possible, or sit up as far as you can in bed or on a chair. 2. Hold the incentive spirometer in an upright position. 3. Breathe out normally. 4. Place the mouthpiece in your mouth and seal your lips tightly around it. 5. Breathe in slowly and as deeply as possible, raising the piston or the ball toward the top of the column. 6. Hold your breath for 3-5 seconds or for as long as possible. Allow the piston or ball to fall to the bottom of the column. 7. Remove the mouthpiece from your mouth and breathe out normally. 8. Rest for a few seconds and repeat Steps 1 through 7 at least 10 times every 1-2 hours when you are awake. Take your time and take a few normal breaths between deep breaths. 9. The spirometer may include an indicator to show your best effort. Use the indicator as a goal to work toward during each repetition. 10. After each set of 10 deep breaths, practice coughing to be sure your lungs are clear. If you have an incision (the cut made at the time of surgery), support your incision when coughing by placing a pillow or rolled up towels firmly  against it. Once you are able to get out of bed, walk around indoors and cough well. You may stop using the incentive spirometer when instructed by your caregiver.  RISKS AND COMPLICATIONS  Take your time so you do not get dizzy or light-headed.  If you are in pain, you may need to take or ask for pain medication before doing incentive  spirometry. It is harder to take a deep breath if you are having pain. AFTER USE  Rest and breathe slowly and easily.  It can be helpful to keep track of a log of your progress. Your caregiver can provide you with a simple table to help with this. If you are using the spirometer at home, follow these instructions: [place] IF:   You are having difficultly using the spirometer.  You have trouble using the spirometer as often as instructed.  Your pain medication is not giving enough relief while using the spirometer.  You develop fever of 100.5 F (38.1 C) or higher. SEEK IMMEDIATE MEDICAL CARE IF:   You cough up bloody sputum that had not been present before.  You develop fever of 102 F (38.9 C) or greater.  You develop worsening pain at or near the incision site. MAKE SURE YOU:   Understand these instructions.  Will watch your condition.  Will get help right away if you are not doing well or get worse. Document Released: 11/19/2006 Document Revised: 10/01/2011 Document Reviewed: 01/20/2007 Exit[employer] Patient Information 2014 Exit[employer], [place].   ________________________________________________________________________  WHAT IS A BLOOD TRANSFUSION? Blood Transfusion Information  A transfusion is the replacement of blood or some of its parts. Blood is made up of multiple cells which provide different functions.  Red blood cells carry oxygen and are used for blood loss replacement.  White blood cells fight against infection.  Platelets control bleeding.  Plasma helps clot blood.  Other blood products are available for specialized needs, such as hemophilia or other clotting disorders. BEFORE THE TRANSFUSION  Who gives blood for transfusions?   Healthy volunteers who are fully evaluated to make sure their blood is safe. This is blood bank blood. Transfusion therapy is the safest it has ever been in the practice of medicine. Before blood is taken from a donor, a  complete history is taken to make sure that person has no history of diseases nor engages in risky social behavior (examples are intravenous drug use or sexual activity with multiple partners). The donor's travel history is screened to minimize risk of transmitting infections, such as malaria. The donated blood is tested for signs of infectious diseases, such as HIV and hepatitis. The blood is then tested to be sure it is compatible with you in order to minimize the chance of a transfusion reaction. If you or a relative donates blood, this is often done in anticipation of surgery and is not appropriate for emergency situations. It takes many days to process the donated blood. RISKS AND COMPLICATIONS Although transfusion therapy is very safe and saves many lives, the main dangers of transfusion include:   Getting an infectious disease.  Developing a transfusion reaction. This is an allergic reaction to something in the blood you were given. Every precaution is taken to prevent this. The decision to have a blood transfusion has been considered carefully by your caregiver before blood is given. Blood is not given unless the benefits outweigh the risks. AFTER THE TRANSFUSION  Right after receiving a blood transfusion, you will usually feel much better and  more energetic. This is especially true if your red blood cells have gotten low (anemic). The transfusion raises the level of the red blood cells which carry oxygen, and this usually causes an energy increase.  The nurse administering the transfusion will monitor you carefully for complications. HOME CARE INSTRUCTIONS  No special instructions are needed after a transfusion. You may find your energy is better. Speak with your caregiver about any limitations on activity for underlying diseases you may have. SEEK MEDICAL CARE IF:   Your condition is not improving after your transfusion.  You develop redness or irritation at the intravenous (IV)  site. SEEK IMMEDIATE MEDICAL CARE IF:  Any of the following symptoms occur over the next 12 hours:  Shaking chills.  You have a temperature by mouth above 102 F (38.9 C), not controlled by medicine.  Chest, back, or muscle pain.  People around you feel you are not acting correctly or are confused.  Shortness of breath or difficulty breathing.  Dizziness and fainting.  You get a rash or develop hives.  You have a decrease in urine output.  Your urine turns a dark color or changes to pink, red, or brown. Any of the following symptoms occur over the next 10 days:  You have a temperature by mouth above 102 F (38.9 C), not controlled by medicine.  Shortness of breath.  Weakness after normal activity.  The white part of the eye turns yellow (jaundice).  You have a decrease in the amount of urine or are urinating less often.  Your urine turns a dark color or changes to pink, red, or brown. Document Released: 07/06/2000 Document Revised: 10/01/2011 Document Reviewed: 02/23/2008 [facility] Patient Information 2014 [place], [place].  _______________________________________________________________________

## 2019-11-04 NOTE — Progress Notes (Signed)
PCP - Georgann Housekeeper Cardiologist - Dr. Zoila Shutter clearance 10-26-19 epic  Chest x-ray -  EKG - 10-26-19 epic Stress Test -  ECHO - 2016 epic Cardiac Cath -   Sleep Study -  CPAP -   Fasting Blood Sugar -  Checks Blood Sugar _____ times a day  Blood Thinner Instructions: Aspirin Instructions: Last Dose:  Anesthesia review:   Patient denies shortness of breath, fever, cough and chest pain at PAT appointment  none   Patient verbalized understanding of instructions that were given to them at the PAT appointment. Patient was also instructed that they will need to review over the PAT instructions again at home before surgery.

## 2019-11-06 ENCOUNTER — Encounter (HOSPITAL_COMMUNITY): Payer: Self-pay

## 2019-11-06 ENCOUNTER — Other Ambulatory Visit: Payer: Self-pay

## 2019-11-06 ENCOUNTER — Encounter (HOSPITAL_COMMUNITY)
Admission: RE | Admit: 2019-11-06 | Discharge: 2019-11-06 | Disposition: A | Discharge status: Home / Self Care | Payer: Medicare PPO | Source: Ambulatory Visit | Attending: Orthopedic Surgery | Admitting: Orthopedic Surgery

## 2019-11-06 DIAGNOSIS — Z01812 Encounter for preprocedural laboratory examination: Secondary | ICD-10-CM | POA: Insufficient documentation

## 2019-11-06 HISTORY — DX: Other complications of anesthesia, initial encounter: T88.59XA

## 2019-11-06 HISTORY — DX: Fibromyalgia: M79.7

## 2019-11-06 HISTORY — DX: Gastro-esophageal reflux disease without esophagitis: K21.9

## 2019-11-06 LAB — COMPREHENSIVE METABOLIC PANEL
ALT: 32 U/L (ref 0–44)
AST: 24 U/L (ref 15–41)
Albumin: 4.2 g/dL (ref 3.5–5.0)
Alkaline Phosphatase: 96 U/L (ref 38–126)
Anion gap: 7 (ref 5–15)
BUN: 12 mg/dL (ref 8–23)
CO2: 27 mmol/L (ref 22–32)
Calcium: 9.6 mg/dL (ref 8.9–10.3)
Chloride: 104 mmol/L (ref 98–111)
Creatinine, Ser: 0.68 mg/dL (ref 0.44–1.00)
GFR calc Af Amer: >60 mL/min (ref >60)
GFR calc non Af Amer: >60 mL/min (ref >60)
Glucose, Bld: 99 mg/dL (ref 70–99)
Potassium: 4.2 mmol/L (ref 3.5–5.1)
Sodium: 138 mmol/L (ref 135–145)
Total Bilirubin: 0.4 mg/dL (ref 0.3–1.2)
Total Protein: 7.5 g/dL (ref 6.5–8.1)

## 2019-11-06 LAB — CBC
HCT: 42.3 % (ref 36.0–46.0)
Hemoglobin: 13.6 g/dL (ref 12.0–15.0)
MCH: 29.6 pg (ref 26.0–34.0)
MCHC: 32.2 g/dL (ref 30.0–36.0)
MCV: 92 fL (ref 80.0–100.0)
Platelets: 306 10*3/uL (ref 150–400)
RBC: 4.6 MIL/uL (ref 3.87–5.11)
RDW: 14.2 % (ref 11.5–15.5)
WBC: 5.2 10*3/uL (ref 4.0–10.5)
nRBC: 0 % (ref 0.0–0.2)

## 2019-11-06 LAB — APTT: aPTT: 26 seconds (ref 24–36)

## 2019-11-06 LAB — SURGICAL PCR SCREEN
MRSA, PCR: NEGATIVE
Staphylococcus aureus: NEGATIVE

## 2019-11-06 LAB — PROTIME-INR
INR: 1 (ref 0.8–1.2)
Prothrombin Time: 13.3 seconds (ref 11.4–15.2)

## 2019-11-09 ENCOUNTER — Encounter (HOSPITAL_COMMUNITY): Payer: Medicare PPO

## 2019-11-12 ENCOUNTER — Inpatient Hospital Stay (HOSPITAL_COMMUNITY): Admission: RE | Admit: 2019-11-12 | Payer: Medicare PPO | Source: Ambulatory Visit

## 2019-11-15 MED ORDER — BUPIVACAINE LIPOSOME 1.3 % IJ SUSP
20.0000 mL | Freq: Once | INTRAMUSCULAR | Status: AC
  Filled 2019-11-15: qty 20

## 2019-11-16 ENCOUNTER — Ambulatory Visit (HOSPITAL_COMMUNITY)
Admission: RE | Admit: 2019-11-16 | Discharge: 2019-11-18 | Disposition: A | Discharge status: Home / Self Care | Payer: Medicare PPO | Attending: Orthopedic Surgery | Admitting: Orthopedic Surgery

## 2019-11-16 ENCOUNTER — Encounter (HOSPITAL_COMMUNITY)
Admission: RE | Disposition: A | Discharge status: Home / Self Care | Payer: Self-pay | Source: Home / Self Care | Attending: Orthopedic Surgery

## 2019-11-16 ENCOUNTER — Ambulatory Visit (HOSPITAL_COMMUNITY): Payer: Medicare PPO | Admitting: Certified Registered"

## 2019-11-16 ENCOUNTER — Other Ambulatory Visit: Payer: Self-pay

## 2019-11-16 ENCOUNTER — Encounter (HOSPITAL_COMMUNITY): Payer: Self-pay | Admitting: Orthopedic Surgery

## 2019-11-16 DIAGNOSIS — Z96643 Presence of artificial hip joint, bilateral: Secondary | ICD-10-CM | POA: Diagnosis not present

## 2019-11-16 DIAGNOSIS — I10 Essential (primary) hypertension: Secondary | ICD-10-CM | POA: Diagnosis not present

## 2019-11-16 DIAGNOSIS — Z7951 Long term (current) use of inhaled steroids: Secondary | ICD-10-CM | POA: Diagnosis not present

## 2019-11-16 DIAGNOSIS — M3500 Sicca syndrome, unspecified: Secondary | ICD-10-CM | POA: Insufficient documentation

## 2019-11-16 DIAGNOSIS — J45909 Unspecified asthma, uncomplicated: Secondary | ICD-10-CM | POA: Diagnosis not present

## 2019-11-16 DIAGNOSIS — Z79899 Other long term (current) drug therapy: Secondary | ICD-10-CM | POA: Diagnosis not present

## 2019-11-16 DIAGNOSIS — M1712 Unilateral primary osteoarthritis, left knee: Secondary | ICD-10-CM | POA: Diagnosis not present

## 2019-11-16 DIAGNOSIS — Z79890 Hormone replacement therapy (postmenopausal): Secondary | ICD-10-CM | POA: Insufficient documentation

## 2019-11-16 DIAGNOSIS — Z20822 Contact with and (suspected) exposure to covid-19: Secondary | ICD-10-CM | POA: Diagnosis not present

## 2019-11-16 DIAGNOSIS — E785 Hyperlipidemia, unspecified: Secondary | ICD-10-CM | POA: Diagnosis not present

## 2019-11-16 DIAGNOSIS — K219 Gastro-esophageal reflux disease without esophagitis: Secondary | ICD-10-CM | POA: Insufficient documentation

## 2019-11-16 DIAGNOSIS — Z7982 Long term (current) use of aspirin: Secondary | ICD-10-CM | POA: Insufficient documentation

## 2019-11-16 DIAGNOSIS — G8918 Other acute postprocedural pain: Secondary | ICD-10-CM | POA: Diagnosis not present

## 2019-11-16 HISTORY — PX: TOTAL KNEE ARTHROPLASTY: SHX125

## 2019-11-16 LAB — TYPE AND SCREEN
ABO/RH(D): O POS
Antibody Screen: NEGATIVE

## 2019-11-16 LAB — RESPIRATORY PANEL BY RT PCR (FLU A&B, COVID)
Influenza A by PCR: NEGATIVE
Influenza B by PCR: NEGATIVE
SARS Coronavirus 2 by RT PCR: NEGATIVE

## 2019-11-16 SURGERY — ARTHROPLASTY, KNEE, TOTAL
Anesthesia: Spinal | Site: Knee | Laterality: Left

## 2019-11-16 MED ORDER — DOCUSATE SODIUM 100 MG PO CAPS
100.0000 mg | ORAL_CAPSULE | Freq: Two times a day (BID) | ORAL | Status: DC
  Administered 2019-11-16 – 2019-11-18 (×4): 100 mg via ORAL
  Filled 2019-11-16 (×4): qty 1

## 2019-11-16 MED ORDER — PHENOL 1.4 % MT LIQD: 1.0000 | OROMUCOSAL | Status: DC | PRN

## 2019-11-16 MED ORDER — METOCLOPRAMIDE HCL 5 MG PO TABS: 5.0000 mg | ORAL_TABLET | Freq: Three times a day (TID) | ORAL | Status: DC | PRN

## 2019-11-16 MED ORDER — ALBUTEROL SULFATE (2.5 MG/3ML) 0.083% IN NEBU: 2.5000 mg | INHALATION_SOLUTION | RESPIRATORY_TRACT | Status: DC | PRN

## 2019-11-16 MED ORDER — DEXAMETHASONE SODIUM PHOSPHATE 10 MG/ML IJ SOLN
8.0000 mg | Freq: Once | INTRAMUSCULAR | Status: AC
  Administered 2019-11-16: 8 mg via INTRAVENOUS

## 2019-11-16 MED ORDER — CEFAZOLIN SODIUM-DEXTROSE 2-4 GM/100ML-% IV SOLN
2.0000 g | INTRAVENOUS | Status: AC
  Administered 2019-11-16: 2 g via INTRAVENOUS
  Filled 2019-11-16: qty 100

## 2019-11-16 MED ORDER — ATORVASTATIN CALCIUM 40 MG PO TABS
40.0000 mg | ORAL_TABLET | Freq: Every evening | ORAL | Status: DC
  Administered 2019-11-16 – 2019-11-17 (×2): 40 mg via ORAL
  Filled 2019-11-16 (×2): qty 1

## 2019-11-16 MED ORDER — PROPOFOL 10 MG/ML IV BOLUS
INTRAVENOUS | Status: AC
  Filled 2019-11-16: qty 20

## 2019-11-16 MED ORDER — ASPIRIN EC 325 MG PO TBEC
325.0000 mg | DELAYED_RELEASE_TABLET | Freq: Two times a day (BID) | ORAL | Status: DC
  Administered 2019-11-17 – 2019-11-18 (×3): 325 mg via ORAL
  Filled 2019-11-16 (×3): qty 1

## 2019-11-16 MED ORDER — ONDANSETRON HCL 4 MG/2ML IJ SOLN
INTRAMUSCULAR | Status: AC
  Filled 2019-11-16: qty 2

## 2019-11-16 MED ORDER — POVIDONE-IODINE 10 % EX SWAB
2.0000 "application " | Freq: Once | CUTANEOUS | Status: AC
  Administered 2019-11-16: 2 via TOPICAL

## 2019-11-16 MED ORDER — FLEET ENEMA 7-19 GM/118ML RE ENEM: 1.0000 | ENEMA | Freq: Once | RECTAL | Status: DC | PRN

## 2019-11-16 MED ORDER — SODIUM CHLORIDE 0.9 % IR SOLN
Status: DC | PRN
  Administered 2019-11-16: 1000 mL

## 2019-11-16 MED ORDER — MIDAZOLAM HCL 2 MG/2ML IJ SOLN
INTRAMUSCULAR | Status: AC
  Filled 2019-11-16: qty 2

## 2019-11-16 MED ORDER — PROPOFOL 500 MG/50ML IV EMUL
INTRAVENOUS | Status: DC | PRN
  Administered 2019-11-16: 75 ug/kg/min via INTRAVENOUS

## 2019-11-16 MED ORDER — MIDAZOLAM HCL 2 MG/2ML IJ SOLN
1.0000 mg | INTRAMUSCULAR | Status: DC
  Administered 2019-11-16: 2 mg via INTRAVENOUS
  Filled 2019-11-16: qty 2

## 2019-11-16 MED ORDER — SODIUM CHLORIDE 0.9 % IV SOLN: INTRAVENOUS | Status: DC

## 2019-11-16 MED ORDER — METHOCARBAMOL 500 MG IVPB - SIMPLE MED
INTRAVENOUS | Status: AC
  Filled 2019-11-16: qty 50

## 2019-11-16 MED ORDER — ACETAMINOPHEN 500 MG PO TABS
1000.0000 mg | ORAL_TABLET | Freq: Four times a day (QID) | ORAL | Status: AC
  Administered 2019-11-16 – 2019-11-17 (×3): 1000 mg via ORAL
  Filled 2019-11-16 (×3): qty 2

## 2019-11-16 MED ORDER — GABAPENTIN 300 MG PO CAPS
300.0000 mg | ORAL_CAPSULE | Freq: Three times a day (TID) | ORAL | Status: DC
  Administered 2019-11-16 – 2019-11-18 (×6): 300 mg via ORAL
  Filled 2019-11-16 (×6): qty 1

## 2019-11-16 MED ORDER — DEXAMETHASONE SODIUM PHOSPHATE 10 MG/ML IJ SOLN
10.0000 mg | Freq: Once | INTRAMUSCULAR | Status: AC
  Administered 2019-11-17: 10 mg via INTRAVENOUS
  Filled 2019-11-16: qty 1

## 2019-11-16 MED ORDER — FENTANYL CITRATE (PF) 100 MCG/2ML IJ SOLN
50.0000 ug | INTRAMUSCULAR | Status: DC
  Administered 2019-11-16: 50 ug via INTRAVENOUS
  Filled 2019-11-16: qty 2

## 2019-11-16 MED ORDER — PROPOFOL 10 MG/ML IV BOLUS
INTRAVENOUS | Status: DC | PRN
  Administered 2019-11-16: 30 mg via INTRAVENOUS
  Administered 2019-11-16: 20 mg via INTRAVENOUS

## 2019-11-16 MED ORDER — SODIUM CHLORIDE (PF) 0.9 % IJ SOLN
INTRAMUSCULAR | Status: DC | PRN
  Administered 2019-11-16: 60 mL

## 2019-11-16 MED ORDER — OXYCODONE HCL 5 MG/5ML PO SOLN: 5.0000 mg | Freq: Once | ORAL | Status: DC | PRN

## 2019-11-16 MED ORDER — FENTANYL CITRATE (PF) 100 MCG/2ML IJ SOLN
INTRAMUSCULAR | Status: AC
  Filled 2019-11-16: qty 2

## 2019-11-16 MED ORDER — PHENYLEPHRINE 40 MCG/ML (10ML) SYRINGE FOR IV PUSH (FOR BLOOD PRESSURE SUPPORT)
PREFILLED_SYRINGE | INTRAVENOUS | Status: DC | PRN
  Administered 2019-11-16: 40 ug via INTRAVENOUS

## 2019-11-16 MED ORDER — MORPHINE SULFATE (PF) 2 MG/ML IV SOLN
0.5000 mg | INTRAVENOUS | Status: DC | PRN
  Administered 2019-11-16 – 2019-11-17 (×3): 1 mg via INTRAVENOUS
  Administered 2019-11-17: 0.5 mg via INTRAVENOUS
  Filled 2019-11-16 (×4): qty 1

## 2019-11-16 MED ORDER — MOMETASONE FURO-FORMOTEROL FUM 200-5 MCG/ACT IN AERO
2.0000 | INHALATION_SPRAY | Freq: Two times a day (BID) | RESPIRATORY_TRACT | Status: DC
  Administered 2019-11-17 – 2019-11-18 (×3): 2 via RESPIRATORY_TRACT
  Filled 2019-11-16: qty 8.8

## 2019-11-16 MED ORDER — ONDANSETRON HCL 4 MG/2ML IJ SOLN
4.0000 mg | Freq: Four times a day (QID) | INTRAMUSCULAR | Status: DC | PRN
  Administered 2019-11-16 – 2019-11-17 (×3): 4 mg via INTRAVENOUS
  Filled 2019-11-16 (×3): qty 2

## 2019-11-16 MED ORDER — BISACODYL 10 MG RE SUPP: 10.0000 mg | Freq: Every day | RECTAL | Status: DC | PRN

## 2019-11-16 MED ORDER — MENTHOL 3 MG MT LOZG: 1.0000 | LOZENGE | OROMUCOSAL | Status: DC | PRN

## 2019-11-16 MED ORDER — METOCLOPRAMIDE HCL 5 MG/ML IJ SOLN
5.0000 mg | Freq: Three times a day (TID) | INTRAMUSCULAR | Status: DC | PRN
  Administered 2019-11-17: 10 mg via INTRAVENOUS
  Filled 2019-11-16: qty 2

## 2019-11-16 MED ORDER — PROMETHAZINE HCL 25 MG/ML IJ SOLN: 6.2500 mg | INTRAMUSCULAR | Status: DC | PRN

## 2019-11-16 MED ORDER — PHENYLEPHRINE 40 MCG/ML (10ML) SYRINGE FOR IV PUSH (FOR BLOOD PRESSURE SUPPORT)
PREFILLED_SYRINGE | INTRAVENOUS | Status: AC
  Filled 2019-11-16: qty 10

## 2019-11-16 MED ORDER — LORATADINE 10 MG PO TABS
10.0000 mg | ORAL_TABLET | Freq: Every day | ORAL | Status: DC
  Administered 2019-11-17 – 2019-11-18 (×2): 10 mg via ORAL
  Filled 2019-11-16 (×2): qty 1

## 2019-11-16 MED ORDER — DIPHENHYDRAMINE HCL 12.5 MG/5ML PO ELIX: 12.5000 mg | ORAL_SOLUTION | ORAL | Status: DC | PRN

## 2019-11-16 MED ORDER — ONDANSETRON HCL 4 MG PO TABS: 4.0000 mg | ORAL_TABLET | Freq: Four times a day (QID) | ORAL | Status: DC | PRN

## 2019-11-16 MED ORDER — CEFAZOLIN SODIUM-DEXTROSE 2-4 GM/100ML-% IV SOLN
2.0000 g | Freq: Four times a day (QID) | INTRAVENOUS | Status: AC
  Administered 2019-11-16 (×2): 2 g via INTRAVENOUS
  Filled 2019-11-16 (×2): qty 100

## 2019-11-16 MED ORDER — ROPIVACAINE HCL 5 MG/ML IJ SOLN
INTRAMUSCULAR | Status: DC | PRN
  Administered 2019-11-16: 20 mL via PERINEURAL

## 2019-11-16 MED ORDER — LOSARTAN POTASSIUM 50 MG PO TABS
50.0000 mg | ORAL_TABLET | Freq: Every day | ORAL | Status: DC
  Administered 2019-11-16 – 2019-11-18 (×3): 50 mg via ORAL
  Filled 2019-11-16 (×2): qty 1

## 2019-11-16 MED ORDER — METHOCARBAMOL 500 MG IVPB - SIMPLE MED
500.0000 mg | Freq: Four times a day (QID) | INTRAVENOUS | Status: DC | PRN
  Administered 2019-11-16: 500 mg via INTRAVENOUS
  Filled 2019-11-16: qty 50

## 2019-11-16 MED ORDER — LIDOCAINE 2% (20 MG/ML) 5 ML SYRINGE
INTRAMUSCULAR | Status: AC
  Filled 2019-11-16: qty 5

## 2019-11-16 MED ORDER — CYCLOSPORINE 0.05 % OP EMUL
1.0000 [drp] | Freq: Two times a day (BID) | OPHTHALMIC | Status: DC
  Administered 2019-11-16 – 2019-11-18 (×4): 1 [drp] via OPHTHALMIC
  Filled 2019-11-16 (×4): qty 1

## 2019-11-16 MED ORDER — PANTOPRAZOLE SODIUM 40 MG PO TBEC: 40.0000 mg | DELAYED_RELEASE_TABLET | Freq: Every day | ORAL | Status: DC | PRN

## 2019-11-16 MED ORDER — TRANEXAMIC ACID-NACL 1000-0.7 MG/100ML-% IV SOLN
1000.0000 mg | INTRAVENOUS | Status: AC
  Administered 2019-11-16: 1000 mg via INTRAVENOUS
  Filled 2019-11-16: qty 100

## 2019-11-16 MED ORDER — OXYCODONE HCL 5 MG PO TABS
5.0000 mg | ORAL_TABLET | ORAL | Status: DC | PRN
  Administered 2019-11-16 – 2019-11-17 (×5): 10 mg via ORAL
  Administered 2019-11-17: 5 mg via ORAL
  Administered 2019-11-17: 10 mg via ORAL
  Administered 2019-11-18 (×2): 5 mg via ORAL
  Filled 2019-11-16: qty 1
  Filled 2019-11-16 (×4): qty 2
  Filled 2019-11-16 (×2): qty 1
  Filled 2019-11-16 (×2): qty 2

## 2019-11-16 MED ORDER — CYCLOBENZAPRINE HCL 10 MG PO TABS
10.0000 mg | ORAL_TABLET | Freq: Two times a day (BID) | ORAL | Status: DC | PRN
  Administered 2019-11-16: 10 mg via ORAL
  Filled 2019-11-16: qty 1

## 2019-11-16 MED ORDER — LACTATED RINGERS IV SOLN: INTRAVENOUS | Status: DC

## 2019-11-16 MED ORDER — METHOCARBAMOL 500 MG PO TABS
500.0000 mg | ORAL_TABLET | Freq: Four times a day (QID) | ORAL | Status: DC | PRN
  Administered 2019-11-16 – 2019-11-18 (×4): 500 mg via ORAL
  Filled 2019-11-16 (×5): qty 1

## 2019-11-16 MED ORDER — POLYETHYLENE GLYCOL 3350 17 G PO PACK: 17.0000 g | PACK | Freq: Every day | ORAL | Status: DC | PRN

## 2019-11-16 MED ORDER — STERILE WATER FOR IRRIGATION IR SOLN
Status: DC | PRN
  Administered 2019-11-16: 2000 mL

## 2019-11-16 MED ORDER — ACETAMINOPHEN 10 MG/ML IV SOLN
1000.0000 mg | Freq: Once | INTRAVENOUS | Status: AC
  Administered 2019-11-16: 1000 mg via INTRAVENOUS
  Filled 2019-11-16: qty 100

## 2019-11-16 MED ORDER — DEXAMETHASONE SODIUM PHOSPHATE 10 MG/ML IJ SOLN
INTRAMUSCULAR | Status: AC
  Filled 2019-11-16: qty 1

## 2019-11-16 MED ORDER — OXYCODONE HCL 5 MG PO TABS: 5.0000 mg | ORAL_TABLET | Freq: Once | ORAL | Status: DC | PRN

## 2019-11-16 MED ORDER — LIDOCAINE 2% (20 MG/ML) 5 ML SYRINGE
INTRAMUSCULAR | Status: DC | PRN
  Administered 2019-11-16: 40 mg via INTRAVENOUS

## 2019-11-16 MED ORDER — SODIUM CHLORIDE (PF) 0.9 % IJ SOLN
INTRAMUSCULAR | Status: AC
  Filled 2019-11-16: qty 10

## 2019-11-16 MED ORDER — TRAMADOL HCL 50 MG PO TABS
50.0000 mg | ORAL_TABLET | Freq: Four times a day (QID) | ORAL | Status: DC | PRN
  Administered 2019-11-17: 50 mg via ORAL
  Filled 2019-11-16: qty 1

## 2019-11-16 MED ORDER — HYDROMORPHONE HCL 1 MG/ML IJ SOLN: 0.2500 mg | INTRAMUSCULAR | Status: DC | PRN

## 2019-11-16 MED ORDER — SODIUM CHLORIDE (PF) 0.9 % IJ SOLN
INTRAMUSCULAR | Status: AC
  Filled 2019-11-16: qty 50

## 2019-11-16 MED ORDER — CHLORHEXIDINE GLUCONATE 4 % EX LIQD: 60.0000 mL | Freq: Once | CUTANEOUS | Status: DC

## 2019-11-16 MED ORDER — ONDANSETRON HCL 4 MG/2ML IJ SOLN
INTRAMUSCULAR | Status: DC | PRN
  Administered 2019-11-16: 4 mg via INTRAVENOUS

## 2019-11-16 MED ORDER — BUPIVACAINE LIPOSOME 1.3 % IJ SUSP
INTRAMUSCULAR | Status: DC | PRN
  Administered 2019-11-16: 20 mL

## 2019-11-16 MED ORDER — BUPIVACAINE IN DEXTROSE 0.75-8.25 % IT SOLN
INTRATHECAL | Status: DC | PRN
  Administered 2019-11-16: 1.6 mL via INTRATHECAL

## 2019-11-16 SURGICAL SUPPLY — 58 items
ATTUNE PSFEM LTSZ5 NARCEM KNEE (Femur) ×3 IMPLANT
ATTUNE PSRP INSE SZ 5 7MM KNEE (Insert) ×1 IMPLANT
ATTUNE PSRP INSE SZ5 7 KNEE (Insert) ×2 IMPLANT
BAG SPEC THK2 15X12 ZIP CLS (MISCELLANEOUS) ×1
BAG ZIPLOCK 12X15 (MISCELLANEOUS) ×3 IMPLANT
BASEPLATE TIBIAL ROTATING SZ 4 (Knees) ×3 IMPLANT
BLADE SAG 18X100X1.27 (BLADE) ×3 IMPLANT
BLADE SAW SGTL 11.0X1.19X90.0M (BLADE) ×3 IMPLANT
BLADE SURG SZ10 CARB STEEL (BLADE) ×6 IMPLANT
BNDG ELASTIC 6X5.8 VLCR STR LF (GAUZE/BANDAGES/DRESSINGS) ×3 IMPLANT
BOWL SMART MIX CTS (DISPOSABLE) ×3 IMPLANT
BSPLAT TIB 4 CMNT ROT PLAT STR (Knees) ×1 IMPLANT
CEMENT HV SMART SET (Cement) ×6 IMPLANT
CLOSURE WOUND 1/2 X4 (GAUZE/BANDAGES/DRESSINGS) ×2
COVER SURGICAL LIGHT HANDLE (MISCELLANEOUS) ×3 IMPLANT
COVER WAND RF STERILE (DRAPES) IMPLANT
CUFF TOURN SGL QUICK 34 (TOURNIQUET CUFF) ×3
CUFF TRNQT CYL 34X4.125X (TOURNIQUET CUFF) ×1 IMPLANT
DECANTER SPIKE VIAL GLASS SM (MISCELLANEOUS) ×3 IMPLANT
DRAPE U-SHAPE 47X51 STRL (DRAPES) ×3 IMPLANT
DRSG AQUACEL AG ADV 3.5X10 (GAUZE/BANDAGES/DRESSINGS) ×3 IMPLANT
DURAPREP 26ML APPLICATOR (WOUND CARE) ×3 IMPLANT
ELECT REM PT RETURN 15FT ADLT (MISCELLANEOUS) ×3 IMPLANT
EVACUATOR 1/8 PVC DRAIN (DRAIN) IMPLANT
GAUZE SPONGE 2X2 8PLY STRL LF (GAUZE/BANDAGES/DRESSINGS) ×1 IMPLANT
GLOVE BIO SURGEON STRL SZ7 (GLOVE) ×3 IMPLANT
GLOVE BIO SURGEON STRL SZ8 (GLOVE) ×3 IMPLANT
GLOVE BIOGEL PI IND STRL 7.0 (GLOVE) ×1 IMPLANT
GLOVE BIOGEL PI IND STRL 8 (GLOVE) ×1 IMPLANT
GLOVE BIOGEL PI INDICATOR 7.0 (GLOVE) ×2
GLOVE BIOGEL PI INDICATOR 8 (GLOVE) ×2
GOWN STRL REUS W/TWL LRG LVL3 (GOWN DISPOSABLE) ×6 IMPLANT
HANDPIECE INTERPULSE COAX TIP (DISPOSABLE) ×3
HOLDER FOLEY CATH W/STRAP (MISCELLANEOUS) IMPLANT
IMMOBILIZER KNEE 20 (SOFTGOODS) ×3
IMMOBILIZER KNEE 20 THIGH 36 (SOFTGOODS) ×1 IMPLANT
KIT TURNOVER KIT A (KITS) IMPLANT
MANIFOLD NEPTUNE II (INSTRUMENTS) ×3 IMPLANT
NS IRRIG 1000ML POUR BTL (IV SOLUTION) ×3 IMPLANT
PACK TOTAL KNEE CUSTOM (KITS) ×3 IMPLANT
PADDING CAST COTTON 6X4 STRL (CAST SUPPLIES) ×3 IMPLANT
PATELLA MEDIAL ATTUN 35MM KNEE (Knees) ×3 IMPLANT
PENCIL SMOKE EVACUATOR (MISCELLANEOUS) IMPLANT
PIN DRILL FIX HALF THREAD (BIT) ×3 IMPLANT
PIN STEINMAN FIXATION KNEE (PIN) ×3 IMPLANT
PROTECTOR NERVE ULNAR (MISCELLANEOUS) ×3 IMPLANT
SET HNDPC FAN SPRY TIP SCT (DISPOSABLE) ×1 IMPLANT
SPONGE GAUZE 2X2 STER 10/PKG (GAUZE/BANDAGES/DRESSINGS) ×2
STRIP CLOSURE SKIN 1/2X4 (GAUZE/BANDAGES/DRESSINGS) ×4 IMPLANT
SUT MNCRL AB 4-0 PS2 18 (SUTURE) ×3 IMPLANT
SUT STRATAFIX 0 PDS 27 VIOLET (SUTURE) ×3
SUT VIC AB 2-0 CT1 27 (SUTURE) ×9
SUT VIC AB 2-0 CT1 TAPERPNT 27 (SUTURE) ×3 IMPLANT
SUTURE STRATFX 0 PDS 27 VIOLET (SUTURE) ×1 IMPLANT
TRAY FOLEY MTR SLVR 16FR STAT (SET/KITS/TRAYS/PACK) ×3 IMPLANT
WATER STERILE IRR 1000ML POUR (IV SOLUTION) ×6 IMPLANT
WRAP KNEE MAXI GEL POST OP (GAUZE/BANDAGES/DRESSINGS) ×3 IMPLANT
YANKAUER SUCT BULB TIP 10FT TU (MISCELLANEOUS) ×3 IMPLANT

## 2019-11-16 NOTE — Anesthesia Procedure Notes (Signed)
Anesthesia Regional Block: Adductor canal block   Pre-Anesthetic Checklist: ,, timeout performed, Correct Patient, Correct Site, Correct Laterality, Correct Procedure, Correct Position, site marked, Risks and benefits discussed,  Surgical consent,  Pre-op evaluation,  At surgeon's request and post-op pain management  Laterality: Left  Prep: chloraprep       Needles:  Injection technique: Single-shot  Needle Type: Stimiplex     Needle Length: 9cm  Needle Gauge: 21     Additional Needles:   Procedures:,,,, ultrasound used (permanent image in chart),,,,  Narrative:  Start time: 11/16/2019 8:05 AM End time: 11/16/2019 8:10 AM Injection made incrementally with aspirations every 5 mL.  Performed by: Personally  Anesthesiologist: Lowella Curb, MD

## 2019-11-16 NOTE — Interval H&P Note (Signed)
History and Physical Interval Note:  11/16/2019 6:52 AM  Kathy Duran  has presented today for surgery, with the diagnosis of left knee osteoarthritits.  The various methods of treatment have been discussed with the patient and family. After consideration of risks, benefits and other options for treatment, the patient has consented to  Procedure(s) with comments: TOTAL KNEE ARTHROPLASTY (Left) - as a surgical intervention.  The patient's history has been reviewed, patient examined, no change in status, stable for surgery.  I have reviewed the patient's chart and labs.  Questions were answered to the patient's satisfaction.     Homero Fellers Gwynevere Lizana

## 2019-11-16 NOTE — Anesthesia Procedure Notes (Signed)
Spinal  Patient location during procedure: OR Start time: 11/16/2019 8:20 AM Staffing Anesthesiologist: Lynda Rainwater, MD Resident/CRNA: Eben Burow, CRNA Preanesthetic Checklist Completed: patient identified, IV checked, site marked, risks and benefits discussed, surgical consent, monitors and equipment checked, pre-op evaluation and timeout performed Spinal Block Patient position: sitting Prep: DuraPrep and site prepped and draped Patient monitoring: heart rate, cardiac monitor, continuous pulse ox and blood pressure Approach: midline Location: L3-4 Injection technique: single-shot Needle Needle type: Pencan  Needle gauge: 24 G Needle length: 9 cm Assessment Sensory level: T4 Additional Notes Pt placed in sitting position, spinal kit expiration date checked and verified, + CSF, - heme, pt tolerated well. Dr Sabra Heck present and supervising for placement of SAB.

## 2019-11-16 NOTE — Discharge Instructions (Addendum)
 Frank Aluisio, MD Total Joint Specialist [facility] [address] [place], [place] [address] [phone]  TOTAL KNEE REPLACEMENT POSTOPERATIVE DIRECTIONS    Knee Rehabilitation, Guidelines Following Surgery  Results after knee surgery are often greatly improved when you follow the exercise, range of motion and muscle strengthening exercises prescribed by your doctor. Safety measures are also important to protect the knee from further injury. If any of these exercises cause you to have increased pain or swelling in your knee joint, decrease the amount until you are comfortable again and slowly increase them. If you have problems or questions, call your caregiver or physical therapist for advice.   BLOOD CLOT PREVENTION . Take a 325 mg Aspirin two times a day for three weeks following surgery. Then resume one 81 mg Aspirin once a day. . You may resume your vitamins/supplements upon discharge from the hospital. . Do not take any NSAIDs (Advil, Aleve, Ibuprofen, Meloxicam, etc.) until you have discontinued the 325 mg Aspirin.  HOME CARE INSTRUCTIONS  . Remove items at home which could result in a fall. This includes throw rugs or furniture in walking pathways.  . ICE to the affected knee as much as tolerated. Icing helps control swelling. If the swelling is well controlled you will be more comfortable and rehab easier. Continue to use ice on the knee for pain and swelling from surgery. You may notice swelling that will progress down to the foot and ankle. This is normal after surgery. Elevate the leg when you are not up walking on it.    . Continue to use the breathing machine which will help keep your temperature down. It is common for your temperature to cycle up and down following surgery, especially at night when you are not up moving around and exerting yourself. The breathing machine keeps your lungs expanded and your temperature down. . Do not place pillow  under the operative knee, focus on keeping the knee straight while resting  DIET You may resume your previous home diet once you are discharged from the hospital.  DRESSING / WOUND CARE / SHOWERING . Keep your bulky bandage on for 2 days. On the third post-operative day you may remove the Ace bandage and gauze. There is a waterproof adhesive bandage on your skin which will stay in place until your first follow-up appointment. Once you remove this you will not need to place another bandage . You may begin showering 3 days following surgery, but do not submerge the incision under water.  ACTIVITY For the first 5 days, the key is rest and control of pain and swelling . Do your home exercises twice a day starting on post-operative day 3. On the days you go to physical therapy, just do the home exercises once that day. . You should rest, ice and elevate the leg for 50 minutes out of every hour. Get up and walk/stretch for 10 minutes per hour. After 5 days you can increase your activity slowly as tolerated. . Walk with your walker as instructed. Use the walker until you are comfortable transitioning to a cane. Walk with the cane in the opposite hand of the operative leg. You may discontinue the cane once you are comfortable and walking steadily. . Avoid periods of inactivity such as sitting longer than an hour when not asleep. This helps prevent blood clots.  . You may discontinue the knee immobilizer once you are able to perform a straight leg raise while lying down. .   You may resume a sexual relationship in one month or when given the OK by your doctor.  . You may return to work once you are cleared by your doctor.  . Do not drive a car for 6 weeks or until released by your surgeon.  . Do not drive while taking narcotics.  TED HOSE STOCKINGS Wear the elastic stockings on both legs for three weeks following surgery during the day. You may remove them at night for sleeping.  WEIGHT BEARING Weight  bearing as tolerated with assist device (walker, cane, etc) as directed, use it as long as suggested by your surgeon or therapist, typically at least 4-6 weeks.  POSTOPERATIVE CONSTIPATION PROTOCOL Constipation - defined medically as fewer than three stools per week and severe constipation as less than one stool per week.  One of the most common issues patients have following surgery is constipation.  Even if you have a regular bowel pattern at home, your normal regimen is likely to be disrupted due to multiple reasons following surgery.  Combination of anesthesia, postoperative narcotics, change in appetite and fluid intake all can affect your bowels.  In order to avoid complications following surgery, here are some recommendations in order to help you during your recovery period.  . Colace (docusate) - Pick up an over-the-counter form of Colace or another stool softener and take twice a day as long as you are requiring postoperative pain medications.  Take with a full glass of water daily.  If you experience loose stools or diarrhea, hold the colace until you stool forms back up. If your symptoms do not get better within 1 week or if they get worse, check with your doctor. . Dulcolax (bisacodyl) - Pick up over-the-counter and take as directed by the product packaging as needed to assist with the movement of your bowels.  Take with a full glass of water.  Use this product as needed if not relieved by Colace only.  . MiraLax (polyethylene glycol) - Pick up over-the-counter to have on hand. MiraLax is a solution that will increase the amount of water in your bowels to assist with bowel movements.  Take as directed and can mix with a glass of water, juice, soda, coffee, or tea. Take if you go more than two days without a movement. Do not use MiraLax more than once per day. Call your doctor if you are still constipated or irregular after using this medication for 7 days in a row.  If you continue to have  problems with postoperative constipation, please contact the office for further assistance and recommendations.  If you experience "the worst abdominal pain ever" or develop nausea or vomiting, please contact the office immediatly for further recommendations for treatment.  ITCHING If you experience itching with your medications, try taking only a single pain pill, or even half a pain pill at a time.  You can also use Benadryl over the counter for itching or also to help with sleep.   MEDICATIONS See your medication summary on the "After Visit Summary" that the nursing staff will review with you prior to discharge.  You may have some home medications which will be placed on hold until you complete the course of blood thinner medication.  It is important for you to complete the blood thinner medication as prescribed by your surgeon.  Continue your approved medications as instructed at time of discharge.  PRECAUTIONS . If you experience chest pain or shortness of breath - call 911 immediately for   transfer to the hospital emergency department.  . If you develop a fever greater that 101 F, purulent drainage from wound, increased redness or drainage from wound, foul odor from the wound/dressing, or calf pain - CONTACT YOUR SURGEON.                                                   FOLLOW-UP APPOINTMENTS Make sure you keep all of your appointments after your operation with your surgeon and caregivers. You should call the office at the above phone number and make an appointment for approximately two weeks after the date of your surgery or on the date instructed by your surgeon outlined in the "After Visit Summary".  RANGE OF MOTION AND STRENGTHENING EXERCISES  Rehabilitation of the knee is important following a knee injury or an operation. After just a few days of immobilization, the muscles of the thigh which control the knee become weakened and shrink (atrophy). Knee exercises are designed to build up the  tone and strength of the thigh muscles and to improve knee motion. Often times heat used for twenty to thirty minutes before working out will loosen up your tissues and help with improving the range of motion but do not use heat for the first two weeks following surgery. These exercises can be done on a training (exercise) mat, on the floor, on a table or on a bed. Use what ever works the best and is most comfortable for you Knee exercises include:  . Leg Lifts - While your knee is still immobilized in a splint or cast, you can do straight leg raises. Lift the leg to 60 degrees, hold for 3 sec, and slowly lower the leg. Repeat 10-20 times 2-3 times daily. Perform this exercise against resistance later as your knee gets better.  . Quad and Hamstring Sets - Tighten up the muscle on the front of the thigh (Quad) and hold for 5-10 sec. Repeat this 10-20 times hourly. Hamstring sets are done by pushing the foot backward against an object and holding for 5-10 sec. Repeat as with quad sets.   Leg Slides: Lying on your back, slowly slide your foot toward your buttocks, bending your knee up off the floor (only go as far as is comfortable). Then slowly slide your foot back down until your leg is flat on the floor again.  Angel Wings: Lying on your back spread your legs to the side as far apart as you can without causing discomfort.  A rehabilitation program following serious knee injuries can speed recovery and prevent re-injury in the future due to weakened muscles. Contact your doctor or a physical therapist for more information on knee rehabilitation.   IF YOU ARE TRANSFERRED TO A SKILLED REHAB FACILITY If the patient is transferred to a skilled rehab facility following release from the hospital, a list of the current medications will be sent to the facility for the patient to continue.  When discharged from the skilled rehab facility, please have the facility set up the patient's Home Health Physical Therapy  prior to being released. Also, the skilled facility will be responsible for providing the patient with their medications at time of release from the facility to include their pain medication, the muscle relaxants, and their blood thinner medication. If the patient is still at the rehab facility at time of the   two week follow up appointment, the skilled rehab facility will also need to assist the patient in arranging follow up appointment in our office and any transportation needs.  MAKE SURE YOU:  . Understand these instructions.  . Get help right away if you are not doing well or get worse.   DENTAL ANTIBIOTICS:  In most cases prophylactic antibiotics for Dental procdeures after total joint surgery are not necessary.  Exceptions are as follows:  1. History of prior total joint infection  2. Severely immunocompromised (Organ Transplant, cancer chemotherapy, Rheumatoid biologic meds such as Humera)  3. Poorly controlled diabetes (A1C &gt; 8.0, blood glucose over 200)  If you have one of these conditions, contact your surgeon for an antibiotic prescription, prior to your dental procedure.    Pick up stool softner and laxative for home use following surgery while on pain medications. Do not submerge incision under water. Please use good hand washing techniques while changing dressing each day. May shower starting three days after surgery. Please use a clean towel to pat the incision dry following showers. Continue to use ice for pain and swelling after surgery. Do not use any lotions or creams on the incision until instructed by your surgeon.  

## 2019-11-16 NOTE — Progress Notes (Signed)
Orthopedic Tech Progress Note Patient Details:  Kathy Duran [birth year] [id number]  CPM Left Knee CPM Left Knee: On Left Knee Flexion (Degrees): 40 Left Knee Extension (Degrees): 10 Additional Comments: Trapeze bar and foot roll  Post Interventions Patient Tolerated: Well Instructions Provided: Care of device  Saul Fordyce 11/16/2019, 10:59 AM

## 2019-11-16 NOTE — Progress Notes (Signed)
Assisted Dr. Miller with left, ultrasound guided, adductor canal block. Side rails up, monitors on throughout procedure. See vital signs in flow sheet. Tolerated Procedure well.  

## 2019-11-16 NOTE — Care Plan (Signed)
Ortho Bundle Case Management Note  Patient Details  Name: Kathy Duran MRN: [id number] Date of Birth: 01-10-1946  L TKA on 11-16-19 DCP:  Home with spouse and dtr.  1 story home with 2 ste. DME:  No needs.  Has a RW and 3-in-1. PT::  [facility].  PT eval scheduled on 11-19-19.                   DME Arranged:  N/A DME Agency:  NA  HH Arranged:  NA HH Agency:  NA  Additional Comments: Please contact me with any questions of if this plan should need to change.  Ennis Forts, RN,CCM [facility]  [phone] 11/16/2019, 11:05 AM

## 2019-11-16 NOTE — Op Note (Signed)
OPERATIVE REPORT-TOTAL KNEE ARTHROPLASTY   Pre-operative diagnosis- Osteoarthritis  Left knee(s)  Post-operative diagnosis- Osteoarthritis Left knee(s)  Procedure-  Left  Total Knee Arthroplasty  Surgeon- Gus Rankin. Greyson Riccardi, MD  Assistant- Leilani Able, PA-C   Anesthesia-  Adductor canal block and spinal  EBL-50 mL   Drains Hemovac  Tourniquet time-  Total Tourniquet Time Documented: Thigh (Left) - 39 minutes Total: Thigh (Left) - 39 minutes     Complications- None  Condition-PACU - hemodynamically stable.   Brief Clinical Note  Kathy Duran is a 74 y.o. year old female with end stage OA of her left knee with progressively worsening pain and dysfunction. She has constant pain, with activity and at rest and significant functional deficits with difficulties even with ADLs. She has had extensive non-op management including analgesics, injections of cortisone and viscosupplements, and home exercise program, but remains in significant pain with significant dysfunction. Radiographs show bone on bone arthritis medial and patellofemoral. She presents now for left Total Knee Arthroplasty.    Procedure in detail---   The patient is brought into the operating room and positioned supine on the operating table. After successful administration of  Adductor canal block and spinal,   a tourniquet is placed high on the  Left thigh(s) and the lower extremity is prepped and draped in the usual sterile fashion. Time out is performed by the operating team and then the  Left lower extremity is wrapped in Esmarch, knee flexed and the tourniquet inflated to 300 mmHg.       A midline incision is made with a ten blade through the subcutaneous tissue to the level of the extensor mechanism. A fresh blade is used to make a medial parapatellar arthrotomy. Soft tissue over the proximal medial tibia is subperiosteally elevated to the joint line with a knife and into the semimembranosus bursa with a Cobb  elevator. Soft tissue over the proximal lateral tibia is elevated with attention being paid to avoiding the patellar tendon on the tibial tubercle. The patella is everted, knee flexed 90 degrees and the ACL and PCL are removed. Findings are bone on bone medial and patellofemoral with large global osteophytes        The drill is used to create a starting hole in the distal femur and the canal is thoroughly irrigated with sterile saline to remove the fatty contents. The 5 degree Left  valgus alignment guide is placed into the femoral canal and the distal femoral cutting block is pinned to remove 9 mm off the distal femur. Resection is made with an oscillating saw.      The tibia is subluxed forward and the menisci are removed. The extramedullary alignment guide is placed referencing proximally at the medial aspect of the tibial tubercle and distally along the second metatarsal axis and tibial crest. The block is pinned to remove 74mm off the more deficient medial  side. Resection is made with an oscillating saw. Size 4is the most appropriate size for the tibia and the proximal tibia is prepared with the modular drill and keel punch for that size.      The femoral sizing guide is placed and size 5 is most appropriate. Rotation is marked off the epicondylar axis and confirmed by creating a rectangular flexion gap at 90 degrees. The size 5 cutting block is pinned in this rotation and the anterior, posterior and chamfer cuts are made with the oscillating saw. The intercondylar block is then placed and that cut is made.  Trial size 4 tibial component, trial size 5 narrow posterior stabilized femur and a 7  mm posterior stabilized rotating platform insert trial is placed. Full extension is achieved with excellent varus/valgus and anterior/posterior balance throughout full range of motion. The patella is everted and thickness measured to be 21  mm. Free hand resection is taken to 12 mm, a 35 template is placed, lug  holes are drilled, trial patella is placed, and it tracks normally. Osteophytes are removed off the posterior femur with the trial in place. All trials are removed and the cut bone surfaces prepared with pulsatile lavage. Cement is mixed and once ready for implantation, the size 4 tibial implant, size  5 narrow posterior stabilized femoral component, and the size 35 patella are cemented in place and the patella is held with the clamp. The trial insert is placed and the knee held in full extension. The Exparel (20 ml mixed with 60 ml saline) is injected into the extensor mechanism, posterior capsule, medial and lateral gutters and subcutaneous tissues.  All extruded cement is removed and once the cement is hard the permanent 7 mm posterior stabilized rotating platform insert is placed into the tibial tray.      The wound is copiously irrigated with saline solution and the extensor mechanism closed over a hemovac drain with #1 V-loc suture. The tourniquet is released for a total tourniquet time of 39  minutes. Flexion against gravity is 130 degrees and the patella tracks normally. Subcutaneous tissue is closed with 2.0 vicryl and subcuticular with running 4.0 Monocryl. The incision is cleaned and dried and steri-strips and a bulky sterile dressing are applied. The limb is placed into a knee immobilizer and the patient is awakened and transported to recovery in stable condition.      Please note that a surgical assistant was a medical necessity for this procedure in order to perform it in a safe and expeditious manner. Surgical assistant was necessary to retract the ligaments and vital neurovascular structures to prevent injury to them and also necessary for proper positioning of the limb to allow for anatomic placement of the prosthesis.   Kathy Plover Braelen Sproule, MD    11/16/2019, 9:39 AM

## 2019-11-16 NOTE — Anesthesia Postprocedure Evaluation (Signed)
Anesthesia Post Note  Patient: Kathy Duran  Procedure(s) Performed: TOTAL KNEE ARTHROPLASTY (Left Knee)     Patient location during evaluation: PACU Anesthesia Type: Spinal Level of consciousness: awake and alert Pain management: pain level controlled Vital Signs Assessment: post-procedure vital signs reviewed and stable Respiratory status: spontaneous breathing, nonlabored ventilation and respiratory function stable Cardiovascular status: blood pressure returned to baseline and stable Postop Assessment: no apparent nausea or vomiting Anesthetic complications: no    Last Vitals:  Vitals:   11/16/19 1100 11/16/19 1123  BP: (!) 142/85 (!) 141/86  Pulse: 64 66  Resp: 13   Temp:    SpO2: 100% 100%    Last Pain:  Vitals:   11/16/19 1100  TempSrc:   PainSc: 2                  Lowella Curb

## 2019-11-16 NOTE — Transfer of Care (Signed)
Immediate Anesthesia Transfer of Care Note  Patient: Kathy Duran  Procedure(s) Performed: TOTAL KNEE ARTHROPLASTY (Left Knee)  Patient Location: PACU  Anesthesia Type:Spinal  Level of Consciousness: awake, alert  and oriented  Airway & Oxygen Therapy: Patient Spontanous Breathing and Patient connected to face mask oxygen  Post-op Assessment: Report given to RN and Post -op Vital signs reviewed and stable  Post vital signs: Reviewed and stable  Last Vitals:  Vitals Value Taken Time  BP    Temp    Pulse    Resp    SpO2      Last Pain:  Vitals:   11/16/19 0809  TempSrc:   PainSc: 0-No pain      Patients Stated Pain Goal: 2 (11/16/19 0802)  Complications: No apparent anesthesia complications

## 2019-11-16 NOTE — Anesthesia Procedure Notes (Signed)
Procedure Name: MAC Performed by: Eben Burow, CRNA Pre-anesthesia Checklist: Patient identified, Emergency Drugs available, Suction available, Patient being monitored and Timeout performed Oxygen Delivery Method: Simple face mask Dental Injury: Teeth and Oropharynx as per pre-operative assessment

## 2019-11-16 NOTE — Progress Notes (Signed)
Orthopedic Tech Progress Note Patient Details:  Kathy Duran January 30, 1946 [id number]  CPM Left Knee CPM Left Knee: Off Left Knee Flexion (Degrees): 40 Left Knee Extension (Degrees): 10 Additional Comments: Trapeze bar and foot roll  Post Interventions Patient Tolerated: Well Instructions Provided: Care of device  Saul Fordyce 11/16/2019, 3:45 PM

## 2019-11-16 NOTE — Evaluation (Signed)
Physical Therapy Evaluation Patient Details Name: Kathy Duran MRN: [id number] DOB: April 30, 1946 Today's Date: 11/16/2019   History of Present Illness  s/p L TKA; PMH: bil THA(posterior 2016), R RCR, back surgery, OA  Clinical Impression  Pt is s/p TKA resulting in the deficits listed below (see PT Problem List).  Pt amb  ~ 20 with RW and min assist --in room only per pt request. Limited by pain. Anticipate steady progress once pain more controlled.   Pt will benefit from skilled PT to increase their independence and safety with mobility to allow discharge to the venue listed below.      Follow Up Recommendations Follow surgeon's recommendation for DC plan and follow-up therapies    Equipment Recommendations  None recommended by PT    Recommendations for Other Services       Precautions / Restrictions Precautions Precautions: Fall Required Braces or Orthoses: Knee Immobilizer - Left Knee Immobilizer - Left: Discontinue once straight leg raise with < 10 degree lag Restrictions Weight Bearing Restrictions: No Other Position/Activity Restrictions: WBAT      Mobility  Bed Mobility Overal bed mobility: Needs Assistance Bed Mobility: Supine to Sit     Supine to sit: Min assist     General bed mobility comments: assist with LLE  Transfers Overall transfer level: Needs assistance Equipment used: Rolling walker (2 wheeled) Transfers: Sit to/from Stand Sit to Stand: Min assist         General transfer comment: cues for hand placement and LLE position  Ambulation/Gait Ambulation/Gait assistance: Min guard;Min assist Gait Distance (Feet): 20 Feet(amb in room at pt request) Assistive device: Rolling walker (2 wheeled) Gait Pattern/deviations: Step-to pattern;Decreased stance time - left     General Gait Details: cues for sequence and RW position  Stairs            Wheelchair Mobility    Modified Rankin (Stroke Patients Only)       Balance                                              Pertinent Vitals/Pain Pain Assessment: 0-10 Pain Score: 6  Pain Location: left knee Pain Descriptors / Indicators: Aching;Grimacing;Sore Pain Intervention(s): Premedicated before session;Repositioned;Monitored during session;Limited activity within patient's tolerance    Home Living Family/patient expects to be discharged to:: Private residence Living Arrangements: Spouse/significant other Available Help at Discharge: Family Type of Home: House Home Access: Stairs to enter Entrance Stairs-Rails: Right;Left;Can reach both Entrance Stairs-Number of Steps: 2 Home Layout: One level Home Equipment: Cane - single point;Walker - 2 wheels;Bedside commode;Tub bench      Prior Function Level of Independence: Independent               Hand Dominance        Extremity/Trunk Assessment   Upper Extremity Assessment Upper Extremity Assessment: Overall WFL for tasks assessed    Lower Extremity Assessment Lower Extremity Assessment: LLE deficits/detail LLE Deficits / Details: ankle WFL, knee and hip grossly 2/5, limited by pain LLE: Unable to fully assess due to pain       Communication   Communication: No difficulties  Cognition Arousal/Alertness: Awake/alert Behavior During Therapy: WFL for tasks assessed/performed Overall Cognitive Status: Within Functional Limits for tasks assessed  General Comments      Exercises Total Joint Exercises Ankle Circles/Pumps: AROM;Both;10 reps   Assessment/Plan    PT Assessment Patient needs continued PT services  PT Problem List Decreased strength;Decreased activity tolerance;Decreased mobility;Decreased knowledge of use of DME;Pain;Decreased range of motion       PT Treatment Interventions DME instruction;Therapeutic exercise;Gait training;Functional mobility training;Therapeutic activities;Patient/family education;Stair  training    PT Goals (Current goals can be found in the Care Plan section)  Acute Rehab PT Goals Patient Stated Goal: home PT Goal Formulation: With patient Time For Goal Achievement: 11/23/19 Potential to Achieve Goals: Good    Frequency 7X/week   Barriers to discharge        Co-evaluation               AM-PAC PT "6 Clicks" Mobility  Outcome Measure Help needed turning from your back to your side while in a flat bed without using bedrails?: A Little Help needed moving from lying on your back to sitting on the side of a flat bed without using bedrails?: A Little Help needed moving to and from a bed to a chair (including a wheelchair)?: A Little Help needed standing up from a chair using your arms (e.g., wheelchair or bedside chair)?: A Little Help needed to walk in hospital room?: A Little Help needed climbing 3-5 steps with a railing? : A Lot 6 Click Score: 17    End of Session Equipment Utilized During Treatment: Gait belt;Left knee immobilizer Activity Tolerance: Patient tolerated treatment well;Patient limited by pain Patient left: in chair;with call bell/phone within reach;with chair alarm set   PT Visit Diagnosis: Difficulty in walking, not elsewhere classified (R26.2)    Time: 5093-2671 PT Time Calculation (min) (ACUTE ONLY): 24 min   Charges:   PT Evaluation $PT Eval Low Complexity: 1 Low PT Treatments $Gait Training: 8-22 mins        Baxter Flattery, PT   Acute Rehab Dept [facility]): [phone]   11/16/2019   [facility] 11/16/2019, 2:57 PM

## 2019-11-16 NOTE — Anesthesia Preprocedure Evaluation (Signed)
Anesthesia Evaluation  Patient identified by MRN, date of birth, ID band Patient awake    Reviewed: Allergy & Precautions, NPO status , Patient's Chart, lab work & pertinent test results  Airway Mallampati: II  TM Distance: >3 FB Neck ROM: Full    Dental no notable dental hx. (+) Caps   Pulmonary asthma , former smoker,    Pulmonary exam normal breath sounds clear to auscultation       Cardiovascular hypertension, Pt. on medications Normal cardiovascular exam Rhythm:Regular Rate:Normal     Neuro/Psych Anxiety negative neurological ROS  negative psych ROS   GI/Hepatic Neg liver ROS, GERD  ,  Endo/Other  negative endocrine ROS  Renal/GU negative Renal ROS  negative genitourinary   Musculoskeletal  (+) Arthritis , Osteoarthritis,  Fibromyalgia -  Abdominal (+) + obese,   Peds negative pediatric ROS (+)  Hematology negative hematology ROS (+)   Anesthesia Other Findings   Reproductive/Obstetrics negative OB ROS                             Anesthesia Physical  Anesthesia Plan  ASA: II  Anesthesia Plan: Spinal   Post-op Pain Management:  Regional for Post-op pain   Induction: Intravenous  PONV Risk Score and Plan: 2 and Ondansetron, Midazolam and Treatment may vary due to age or medical condition  Airway Management Planned: Simple Face Mask  Additional Equipment:   Intra-op Plan:   Post-operative Plan:   Informed Consent: I have reviewed the patients History and Physical, chart, labs and discussed the procedure including the risks, benefits and alternatives for the proposed anesthesia with the patient or authorized representative who has indicated his/her understanding and acceptance.     Dental advisory given  Plan Discussed with: CRNA  Anesthesia Plan Comments:         Anesthesia Quick Evaluation

## 2019-11-17 ENCOUNTER — Encounter: Payer: Self-pay | Admitting: *Deleted

## 2019-11-17 DIAGNOSIS — I10 Essential (primary) hypertension: Secondary | ICD-10-CM | POA: Diagnosis not present

## 2019-11-17 DIAGNOSIS — Z20822 Contact with and (suspected) exposure to covid-19: Secondary | ICD-10-CM | POA: Diagnosis not present

## 2019-11-17 DIAGNOSIS — M1712 Unilateral primary osteoarthritis, left knee: Secondary | ICD-10-CM | POA: Diagnosis not present

## 2019-11-17 DIAGNOSIS — Z96643 Presence of artificial hip joint, bilateral: Secondary | ICD-10-CM | POA: Diagnosis not present

## 2019-11-17 DIAGNOSIS — J45909 Unspecified asthma, uncomplicated: Secondary | ICD-10-CM | POA: Diagnosis not present

## 2019-11-17 DIAGNOSIS — E785 Hyperlipidemia, unspecified: Secondary | ICD-10-CM | POA: Diagnosis not present

## 2019-11-17 DIAGNOSIS — M3500 Sicca syndrome, unspecified: Secondary | ICD-10-CM | POA: Diagnosis not present

## 2019-11-17 DIAGNOSIS — K219 Gastro-esophageal reflux disease without esophagitis: Secondary | ICD-10-CM | POA: Diagnosis not present

## 2019-11-17 DIAGNOSIS — Z7982 Long term (current) use of aspirin: Secondary | ICD-10-CM | POA: Diagnosis not present

## 2019-11-17 LAB — CBC
HCT: 35.7 % — ABNORMAL LOW (ref 36.0–46.0)
Hemoglobin: 11.2 g/dL — ABNORMAL LOW (ref 12.0–15.0)
MCH: 29.7 pg (ref 26.0–34.0)
MCHC: 31.4 g/dL (ref 30.0–36.0)
MCV: 94.7 fL (ref 80.0–100.0)
Platelets: 292 10*3/uL (ref 150–400)
RBC: 3.77 MIL/uL — ABNORMAL LOW (ref 3.87–5.11)
RDW: 14.3 % (ref 11.5–15.5)
WBC: 7.3 10*3/uL (ref 4.0–10.5)
nRBC: 0 % (ref 0.0–0.2)

## 2019-11-17 LAB — BASIC METABOLIC PANEL
Anion gap: 8 (ref 5–15)
BUN: 11 mg/dL (ref 8–23)
CO2: 25 mmol/L (ref 22–32)
Calcium: 9 mg/dL (ref 8.9–10.3)
Chloride: 104 mmol/L (ref 98–111)
Creatinine, Ser: 0.65 mg/dL (ref 0.44–1.00)
GFR calc Af Amer: >60 mL/min (ref >60)
GFR calc non Af Amer: >60 mL/min (ref >60)
Glucose, Bld: 124 mg/dL — ABNORMAL HIGH (ref 70–99)
Potassium: 4.3 mmol/L (ref 3.5–5.1)
Sodium: 137 mmol/L (ref 135–145)

## 2019-11-17 NOTE — Progress Notes (Signed)
Physical Therapy Treatment Patient Details Name: Kathy Duran MRN: [id number] DOB: [birth year] Today's Date: 11/17/2019    History of Present Illness s/p L TKA; PMH: bil THA(posterior 2016), R RCR, back surgery, OA    PT Comments    Pt progressing although limited by pain. Able to amb short distance in the room only. Will see again this pm.  Follow Up Recommendations  Follow surgeon's recommendation for DC plan and follow-up therapies     Equipment Recommendations  None recommended by PT    Recommendations for Other Services       Precautions / Restrictions Precautions Precautions: Fall Required Braces or Orthoses: Knee Immobilizer - Left Knee Immobilizer - Left: Discontinue once straight leg raise with < 10 degree lag Restrictions Weight Bearing Restrictions: No Other Position/Activity Restrictions: WBAT    Mobility  Bed Mobility Overal bed mobility: Needs Assistance Bed Mobility: Supine to Sit     Supine to sit: Min assist     General bed mobility comments: assist with LLE, incr time, cues for technique and self assist   Transfers Overall transfer level: Needs assistance Equipment used: Rolling walker (2 wheeled) Transfers: Sit to/from Stand Sit to Stand: Min assist         General transfer comment: cues for hand placement and LLE position  Ambulation/Gait Ambulation/Gait assistance: Min assist Gait Distance (Feet): 15 Feet(x2) Assistive device: Rolling walker (2 wheeled) Gait Pattern/deviations: Step-to pattern;Decreased stance time - left;Decreased weight shift to left;Antalgic;Trunk flexed     General Gait Details: cues for sequence and RW position   Stairs             Wheelchair Mobility    Modified Rankin (Stroke Patients Only)       Balance                                            Cognition Arousal/Alertness: Awake/alert Behavior During Therapy: WFL for tasks assessed/performed Overall Cognitive Status:  Within Functional Limits for tasks assessed                                        Exercises Total Joint Exercises Ankle Circles/Pumps: AROM;Both;10 reps Quad Sets: AROM;Both;5 reps;Limitations Quad Sets Limitations: pain    General Comments        Pertinent Vitals/Pain Pain Assessment: 0-10 Pain Score: 9  Pain Location: left knee Pain Descriptors / Indicators: Aching;Grimacing;Sore Pain Intervention(s): Limited activity within patient's tolerance;Monitored during session;Premedicated before session;Repositioned;Ice applied    Home Living                      Prior Function            PT Goals (current goals can now be found in the care plan section) Acute Rehab PT Goals Patient Stated Goal: home PT Goal Formulation: With patient Time For Goal Achievement: 11/23/19 Potential to Achieve Goals: Good Progress towards PT goals: Progressing toward goals    Frequency    7X/week      PT Plan Current plan remains appropriate    Co-evaluation              AM-PAC PT "6 Clicks" Mobility   Outcome Measure  Help needed turning from your back to your side while in a flat  bed without using bedrails?: A Little Help needed moving from lying on your back to sitting on the side of a flat bed without using bedrails?: A Little Help needed moving to and from a bed to a chair (including a wheelchair)?: A Little Help needed standing up from a chair using your arms (e.g., wheelchair or bedside chair)?: A Little Help needed to walk in hospital room?: A Little Help needed climbing 3-5 steps with a railing? : A Lot 6 Click Score: 17    End of Session Equipment Utilized During Treatment: Gait belt;Left knee immobilizer Activity Tolerance: Patient tolerated treatment well;Patient limited by pain Patient left: in chair;with call bell/phone within reach;with chair alarm set;with family/visitor present Nurse Communication: Mobility status PT Visit  Diagnosis: Difficulty in walking, not elsewhere classified (R26.2)     Time: 7858-8502 PT Time Calculation (min) (ACUTE ONLY): 21 min  Charges:  $Gait Training: 8-22 mins                     Baxter Flattery, PT   Acute Rehab Dept [facility]): [phone]   11/17/2019    [facility] 11/17/2019, 10:39 AM

## 2019-11-17 NOTE — Progress Notes (Signed)
Orthopedic Tech Progress Note Patient Details:  Kathy Duran 1945/09/02 [id number]  CPM Left Knee CPM Left Knee: On Left Knee Flexion (Degrees): 40 Left Knee Extension (Degrees): 10 Additional Comments: Trapeze bar and foot roll  Post Interventions Patient Tolerated: Well Instructions Provided: Care of device  Saul Fordyce 11/17/2019, 5:50 PM

## 2019-11-17 NOTE — Progress Notes (Signed)
Orthopedic Tech Progress Note Patient Details:  Kathy Duran 1946/01/01 [id number]  CPM Left Knee CPM Left Knee: Off Left Knee Flexion (Degrees): 40 Left Knee Extension (Degrees): 10 Additional Comments: Trapeze bar and foot roll  Post Interventions Patient Tolerated: Well Instructions Provided: Care of device  Saul Fordyce 11/17/2019, 6:48 PM

## 2019-11-17 NOTE — Progress Notes (Signed)
Subjective: 1 Day Post-Op Procedure(s) (LRB): TOTAL KNEE ARTHROPLASTY (Left) Patient reports pain as moderate.   Patient seen in rounds by Dr. Wynelle Link. Patient is well, and has had no acute complaints or problems other than pain in the left knee. Denies chest pain, SOB, or calf pain. No issues overnight. Foley catheter to be removed this AM. We will continue therapy today, ambulated 20' yesterday.   Objective: Vital signs in last 24 hours: Temp:  [97.1 F (36.2 C)-99.6 F (37.6 C)] 99.5 F (37.5 C) (04/27 0539) Pulse Rate:  [64-110] 110 (04/27 0539) Resp:  [11-20] 19 (04/27 0539) BP: (123-151)/(68-99) 142/70 (04/27 0539) SpO2:  [93 %-100 %] 93 % (04/27 0539)  Intake/Output from previous day:  Intake/Output Summary (Last 24 hours) at 11/17/2019 0737 Last data filed at 11/17/2019 0542 Gross per 24 hour  Intake 3522.2 ml  Output 1670 ml  Net 1852.2 ml     Intake/Output this shift: No intake/output data recorded.  Labs: Recent Labs    11/17/19 0247  HGB 11.2*   Recent Labs    11/17/19 0247  WBC 7.3  RBC 3.77*  HCT 35.7*  PLT 292   Recent Labs    11/17/19 0247  NA 137  K 4.3  CL 104  CO2 25  BUN 11  CREATININE 0.65  GLUCOSE 124*  CALCIUM 9.0   No results for input(s): LABPT, INR in the last 72 hours.  Exam: General - Patient is Alert and Oriented Extremity - Neurologically intact Neurovascular intact Sensation intact distally Dorsiflexion/Plantar flexion intact Dressing - dressing C/D/I Motor Function - intact, moving foot and toes well on exam.   Past Medical History:  Diagnosis Date  . Anxiety   . Arthritis    left hip  . Asthma    Inhalers for tx  . Bilateral primary osteoarthritis of knee 07/06/2016   Moderate  . Bilateral swelling of feet    bilateral ankle and feet edema  . Bruises easily   . Complication of anesthesia    sometimes needs oxygen when waking uo due to asthma  . DDD (degenerative disc disease), lumbar 07/06/2016  .  Fibromyalgia   . GERD (gastroesophageal reflux disease)   . Hypertension   . Myofacial muscle pain 07/06/2016  . Osteoarthritis of both hands 07/06/2016  . Seasonal allergies 07/06/2016  . Sinus complaint   . Sjogren's syndrome ([place])    dry eyes  . Tenonitis   . Urinary incontinence    occ. an issue wears pads    Assessment/Plan: 1 Day Post-Op Procedure(s) (LRB): TOTAL KNEE ARTHROPLASTY (Left) Principal Problem:   Unilateral primary osteoarthritis, left knee Active Problems:   Primary osteoarthritis of left knee  Estimated body mass index is 36.17 kg/m as calculated from the following:   Height as of this encounter: 5\' 5"  (1.651 m).   Weight as of this encounter: 98.6 kg. Advance diet Up with therapy   Patient's anticipated LOS is less than 2 midnights, meeting these requirements: - Younger than 43 - Lives within 1 hour of care - Has a competent adult at home to recover with post-op recover - NO history of  - Chronic pain requiring opiods  - Diabetes  - Coronary Artery Disease  - Heart failure  - Heart attack  - Stroke  - DVT/VTE  - Cardiac arrhythmia  - Respiratory Failure/COPD  - Renal failure  - Anemia  - Advanced Liver disease  DVT Prophylaxis - Aspirin Weight bearing as tolerated. D/C O2 and pulse  ox and try on room air. Hemovac pulled without difficulty, will continue therapy today.  Plan is to go Home after hospital stay. Plan for discharge tomorrow if pain controlled and meeting goals with therapy.  Arther Abbott, PA-C Orthopedic Surgery [phone] 11/17/2019, 7:37 AM

## 2019-11-17 NOTE — Progress Notes (Signed)
Orthopedic Tech Progress Note Patient Details:  Kathy Duran 10/22/1945 [id number]  Patient ID: [name], female   DOB: 30-Jun-1946, 74 y.o.   MRN: [id number]   Saul Fordyce 11/17/2019, 10:16 AMRemove foot roll charge

## 2019-11-17 NOTE — Progress Notes (Signed)
Physical Therapy Treatment Patient Details Name: Kathy Duran MRN: [id number] DOB: 06-Mar-1946 Today's Date: 11/17/2019    History of Present Illness s/p L TKA; PMH: bil THA(posterior 2016), R RCR, back surgery, OA    PT Comments    Pt progressing well, pain much improved this pm. incr gait distance. Knee flexion limited d/t incr guarding although improved from am session. Pt will benefit from additional day of PT prior to d/c  Follow Up Recommendations  Follow surgeon's recommendation for DC plan and follow-up therapies     Equipment Recommendations  None recommended by PT    Recommendations for Other Services       Precautions / Restrictions Precautions Precautions: Fall Required Braces or Orthoses: Knee Immobilizer - Left Knee Immobilizer - Left: Discontinue once straight leg raise with < 10 degree lag Restrictions Weight Bearing Restrictions: No Other Position/Activity Restrictions: WBAT    Mobility  Bed Mobility Overal bed mobility: Needs Assistance Bed Mobility: Sit to Supine       Sit to supine: Min assist   General bed mobility comments: assist with LLE, incr time, cues for technique and self assist with gait belt as leg lifter   Transfers Overall transfer level: Needs assistance Equipment used: Rolling walker (2 wheeled) Transfers: Sit to/from Stand Sit to Stand: Min assist;Min guard         General transfer comment: cues for hand placement and LLE position  Ambulation/Gait Ambulation/Gait assistance: Min assist Gait Distance (Feet): 45 Feet Assistive device: Rolling walker (2 wheeled) Gait Pattern/deviations: Step-to pattern;Decreased stance time - left;Decreased weight shift to left;Antalgic;Trunk flexed     General Gait Details: cues for sequence and RW position   Stairs             Wheelchair Mobility    Modified Rankin (Stroke Patients Only)       Balance                                             Cognition Arousal/Alertness: Awake/alert Behavior During Therapy: WFL for tasks assessed/performed Overall Cognitive Status: Within Functional Limits for tasks assessed                                        Exercises Total Joint Exercises Ankle Circles/Pumps: AROM;Both;10 reps Quad Sets: AROM;Both;10 reps Heel Slides: AAROM;Left;10 reps Hip ABduction/ADduction: AAROM;Left;10 reps Straight Leg Raises: AAROM;Left;10 reps Goniometric ROM: grossly 15 to 45 degress AAROM L knee flexion    General Comments        Pertinent Vitals/Pain Pain Assessment: 0-10 Pain Score: 4  Pain Location: left knee Pain Descriptors / Indicators: Aching;Grimacing;Sore Pain Intervention(s): Limited activity within patient's tolerance;Monitored during session;Repositioned;Ice applied    Home Living                      Prior Function            PT Goals (current goals can now be found in the care plan section) Acute Rehab PT Goals Patient Stated Goal: home PT Goal Formulation: With patient Time For Goal Achievement: 11/23/19 Potential to Achieve Goals: Good Progress towards PT goals: Progressing toward goals    Frequency    7X/week      PT Plan Current plan remains appropriate  Co-evaluation              AM-PAC PT "6 Clicks" Mobility   Outcome Measure  Help needed turning from your back to your side while in a flat bed without using bedrails?: A Little Help needed moving from lying on your back to sitting on the side of a flat bed without using bedrails?: A Little Help needed moving to and from a bed to a chair (including a wheelchair)?: A Little Help needed standing up from a chair using your arms (e.g., wheelchair or bedside chair)?: A Little Help needed to walk in hospital room?: A Little Help needed climbing 3-5 steps with a railing? : A Lot 6 Click Score: 17    End of Session Equipment Utilized During Treatment: Gait belt;Left knee  immobilizer Activity Tolerance: Patient tolerated treatment well;Patient limited by pain Patient left: with family/visitor present;in bed;with call bell/phone within reach;with bed alarm set Nurse Communication: Mobility status PT Visit Diagnosis: Difficulty in walking, not elsewhere classified (R26.2)     Time: 4884-5733 PT Time Calculation (min) (ACUTE ONLY): 25 min  Charges:  $Gait Training: 8-22 mins $Therapeutic Exercise: 8-22 mins                     Delice Bison, PT   Acute Rehab Dept [facility]): [phone]   11/17/2019    [facility] 11/17/2019, 2:33 PM

## 2019-11-18 DIAGNOSIS — Z7982 Long term (current) use of aspirin: Secondary | ICD-10-CM | POA: Diagnosis not present

## 2019-11-18 DIAGNOSIS — K219 Gastro-esophageal reflux disease without esophagitis: Secondary | ICD-10-CM | POA: Diagnosis not present

## 2019-11-18 DIAGNOSIS — Z96643 Presence of artificial hip joint, bilateral: Secondary | ICD-10-CM | POA: Diagnosis not present

## 2019-11-18 DIAGNOSIS — M1712 Unilateral primary osteoarthritis, left knee: Secondary | ICD-10-CM | POA: Diagnosis not present

## 2019-11-18 DIAGNOSIS — I10 Essential (primary) hypertension: Secondary | ICD-10-CM | POA: Diagnosis not present

## 2019-11-18 DIAGNOSIS — M3500 Sicca syndrome, unspecified: Secondary | ICD-10-CM | POA: Diagnosis not present

## 2019-11-18 DIAGNOSIS — E785 Hyperlipidemia, unspecified: Secondary | ICD-10-CM | POA: Diagnosis not present

## 2019-11-18 DIAGNOSIS — J45909 Unspecified asthma, uncomplicated: Secondary | ICD-10-CM | POA: Diagnosis not present

## 2019-11-18 DIAGNOSIS — Z20822 Contact with and (suspected) exposure to covid-19: Secondary | ICD-10-CM | POA: Diagnosis not present

## 2019-11-18 LAB — BASIC METABOLIC PANEL
Anion gap: 9 (ref 5–15)
BUN: 10 mg/dL (ref 8–23)
CO2: 26 mmol/L (ref 22–32)
Calcium: 9 mg/dL (ref 8.9–10.3)
Chloride: 105 mmol/L (ref 98–111)
Creatinine, Ser: 0.67 mg/dL (ref 0.44–1.00)
GFR calc Af Amer: >60 mL/min (ref >60)
GFR calc non Af Amer: >60 mL/min (ref >60)
Glucose, Bld: 124 mg/dL — ABNORMAL HIGH (ref 70–99)
Potassium: 4.2 mmol/L (ref 3.5–5.1)
Sodium: 140 mmol/L (ref 135–145)

## 2019-11-18 LAB — CBC
HCT: 35.9 % — ABNORMAL LOW (ref 36.0–46.0)
Hemoglobin: 11.4 g/dL — ABNORMAL LOW (ref 12.0–15.0)
MCH: 29.7 pg (ref 26.0–34.0)
MCHC: 31.8 g/dL (ref 30.0–36.0)
MCV: 93.5 fL (ref 80.0–100.0)
Platelets: 303 10*3/uL (ref 150–400)
RBC: 3.84 MIL/uL — ABNORMAL LOW (ref 3.87–5.11)
RDW: 14.3 % (ref 11.5–15.5)
WBC: 7.7 10*3/uL (ref 4.0–10.5)
nRBC: 0 % (ref 0.0–0.2)

## 2019-11-18 MED ORDER — METHOCARBAMOL 500 MG PO TABS: 500.0000 mg | ORAL_TABLET | Freq: Four times a day (QID) | ORAL | 0 refills | Status: DC | PRN

## 2019-11-18 MED ORDER — OXYCODONE HCL 5 MG PO TABS: 5.0000 mg | ORAL_TABLET | Freq: Four times a day (QID) | ORAL | 0 refills | Status: DC | PRN

## 2019-11-18 MED ORDER — GABAPENTIN 300 MG PO CAPS: ORAL_CAPSULE | ORAL | 0 refills | Status: DC

## 2019-11-18 MED ORDER — SODIUM CHLORIDE 0.9 % IV BOLUS
500.0000 mL | Freq: Once | INTRAVENOUS | Status: AC
  Administered 2019-11-18: 500 mL via INTRAVENOUS

## 2019-11-18 MED ORDER — TRAMADOL HCL 50 MG PO TABS: 50.0000 mg | ORAL_TABLET | Freq: Four times a day (QID) | ORAL | 0 refills | Status: DC | PRN

## 2019-11-18 MED ORDER — ASPIRIN 325 MG PO TBEC: 325.0000 mg | DELAYED_RELEASE_TABLET | Freq: Two times a day (BID) | ORAL | 0 refills | Status: AC

## 2019-11-18 NOTE — Progress Notes (Signed)
Reviewed AVS with patient and her husband.  Answered all questions to her satisfaction.  Patient alert and oriented, skin warm and dry.   Taken to main lobby and released to her husband without concerns or complaints.

## 2019-11-18 NOTE — Plan of Care (Signed)

## 2019-11-18 NOTE — Progress Notes (Signed)
Subjective: 2 Days Post-Op Procedure(s) (LRB): TOTAL KNEE ARTHROPLASTY (Left) Patient reports pain as mild.   Patient seen in rounds with Dr. Lequita Halt. Patient is well, and has had no acute complaints or problems. States she is feeling better this AM, pain well controlled with medications. Denies chest pain, SOB, or calf pain. Voiding without difficulty and positive flatus.  Plan is to go Home after hospital stay.  Objective: Vital signs in last 24 hours: Temp:  [99 F (37.2 C)-100.3 F (37.9 C)] 99.3 F (37.4 C) (04/28 0509) Pulse Rate:  [105-115] 107 (04/28 0509) Resp:  [16-19] 16 (04/28 0509) BP: (150-163)/(71-81) 150/71 (04/28 0509) SpO2:  [97 %-98 %] 98 % (04/28 0509)  Intake/Output from previous day:  Intake/Output Summary (Last 24 hours) at 11/18/2019 0700 Last data filed at 11/18/2019 0600 Gross per 24 hour  Intake 1385.81 ml  Output 2200 ml  Net -814.19 ml    Intake/Output this shift: Total I/O In: 360 [P.O.:360] Out: 700 [Urine:700]  Labs: Recent Labs    11/17/19 0247 11/18/19 0245  HGB 11.2* 11.4*   Recent Labs    11/17/19 0247 11/18/19 0245  WBC 7.3 7.7  RBC 3.77* 3.84*  HCT 35.7* 35.9*  PLT 292 303   Recent Labs    11/17/19 0247 11/18/19 0245  NA 137 140  K 4.3 4.2  CL 104 105  CO2 25 26  BUN 11 10  CREATININE 0.65 0.67  GLUCOSE 124* 124*  CALCIUM 9.0 9.0   No results for input(s): LABPT, INR in the last 72 hours.  Exam: General - Patient is Alert and Oriented Extremity - Neurologically intact Neurovascular intact Sensation intact distally Dorsiflexion/Plantar flexion intact Dressing/Incision - clean, dry, no drainage Motor Function - intact, moving foot and toes well on exam.   Past Medical History:  Diagnosis Date  . Anxiety   . Arthritis    left hip  . Asthma    Inhalers for tx  . Bilateral primary osteoarthritis of knee 07/06/2016   Moderate  . Bilateral swelling of feet    bilateral ankle and feet edema  . Bruises  easily   . Complication of anesthesia    sometimes needs oxygen when waking uo due to asthma  . DDD (degenerative disc disease), lumbar 07/06/2016  . Fibromyalgia   . GERD (gastroesophageal reflux disease)   . Hypertension   . Myofacial muscle pain 07/06/2016  . Osteoarthritis of both hands 07/06/2016  . Seasonal allergies 07/06/2016  . Sinus complaint   . Sjogren's syndrome ([facility])    dry eyes  . Tenonitis   . Urinary incontinence    occ. an issue wears pads    Assessment/Plan: 2 Days Post-Op Procedure(s) (LRB): TOTAL KNEE ARTHROPLASTY (Left) Principal Problem:   Unilateral primary osteoarthritis, left knee Active Problems:   Primary osteoarthritis of left knee  Estimated body mass index is 36.17 kg/m as calculated from the following:   Height as of this encounter: 5\' 5"  (1.651 m).   Weight as of this encounter: 98.6 kg. Up with therapy  DVT Prophylaxis - Aspirin Weight-bearing as tolerated  Pt tachycardic with HR ranging from 105-110 yesterday and overnight. Most likely due to hypovolemia.  Will order two 500 mL boluses to be given this AM, with the second occurring after the first physical therapy session. Plan for discharge once meeting goals with PT. Scheduled for OPPT at [facility]. Follow-up in the office in 2 weeks.   [facility], PA-C Orthopedic Surgery [phone] 11/18/2019, 7:00 AM

## 2019-11-18 NOTE — Progress Notes (Signed)
   11/18/19 1300  PT Visit Information  Last PT Received On 11/18/19  Pt continues to progress. Reviewed stairs and assisted pt with dressing. Ready for d/c from PT standpoint.   Assistance Needed +1  History of Present Illness s/p L TKA; PMH: bil THA(posterior 2016), R RCR, back surgery, OA  Subjective Data  Patient Stated Goal home  Precautions  Precautions Fall  Required Braces or Orthoses Knee Immobilizer - Left  Knee Immobilizer - Left Discontinue once straight leg raise with < 10 degree lag  Restrictions  Weight Bearing Restrictions No  Other Position/Activity Restrictions WBAT  Pain Assessment  Pain Assessment 0-10  Pain Score 3  Pain Location left knee  Pain Descriptors / Indicators Aching;Grimacing;Sore  Pain Intervention(s) Limited activity within patient's tolerance;Monitored during session;Premedicated before session  Cognition  Arousal/Alertness Awake/alert  Behavior During Therapy WFL for tasks assessed/performed  Overall Cognitive Status Within Functional Limits for tasks assessed  Bed Mobility  General bed mobility comments pt in recliner on arrival   Transfers  Overall transfer level Needs assistance  Equipment used Rolling walker (2 wheeled)  Transfers Sit to/from Stand  Sit to Stand Min guard;Supervision  General transfer comment cues for hand placement and LLE position--from bed and chair   Ambulation/Gait  Ambulation/Gait assistance Supervision  Gait Distance (Feet) 30 Feet  Assistive device Rolling walker (2 wheeled)  Gait Pattern/deviations Step-to pattern;Decreased stance time - left;Decreased weight shift to left;Antalgic;Trunk flexed  General Gait Details cues for sequence, posture  and RW position  Stairs Yes  Stairs assistance Min guard  Stair Management Two rails;Step to pattern;Forwards  Number of Stairs 3  General stair comments cues for sequence, husband present.  PT - End of Session  Equipment Utilized During Treatment Gait belt;Left knee  immobilizer  Activity Tolerance Patient tolerated treatment well  Patient left with family/visitor present;with call bell/phone within reach;in chair;with chair alarm set  Nurse Communication Mobility status   PT - Assessment/Plan  PT Plan Current plan remains appropriate  PT Visit Diagnosis Difficulty in walking, not elsewhere classified (R26.2)  PT Frequency (ACUTE ONLY) 7X/week  Follow Up Recommendations Follow surgeon's recommendation for DC plan and follow-up therapies  PT equipment None recommended by PT  AM-PAC PT "6 Clicks" Mobility Outcome Measure (Version 2)  Help needed turning from your back to your side while in a flat bed without using bedrails? 3  Help needed moving from lying on your back to sitting on the side of a flat bed without using bedrails? 3  Help needed moving to and from a bed to a chair (including a wheelchair)? 3  Help needed standing up from a chair using your arms (e.g., wheelchair or bedside chair)? 3  Help needed to walk in hospital room? 3  Help needed climbing 3-5 steps with a railing?  2  6 Click Score 17  Consider Recommendation of Discharge To: Home with [facility]  PT Goal Progression  Progress towards PT goals Progressing toward goals  Acute Rehab PT Goals  PT Goal Formulation With patient  Time For Goal Achievement 11/23/19  Potential to Achieve Goals Good  PT Time Calculation  PT Start Time (ACUTE ONLY) 1338  PT Stop Time (ACUTE ONLY) 1355  PT Time Calculation (min) (ACUTE ONLY) 17 min  PT General Charges  $$ ACUTE PT VISIT 1 Visit  PT Treatments  $Gait Training 8-22 mins

## 2019-11-18 NOTE — Progress Notes (Signed)
Physical Therapy Treatment Patient Details Name: Kathy Duran MRN: [id number] DOB: 08-Mar-1946 Today's Date: 11/18/2019    History of Present Illness s/p L TKA; PMH: bil THA(posterior 2016), R RCR, back surgery, OA    PT Comments    Pt  Progressing well today. incr amb/distance tolerance. Will see for a second session after pt's bolus. Pt should be ready to d/c later today   Follow Up Recommendations  Follow surgeon's recommendation for DC plan and follow-up therapies     Equipment Recommendations  None recommended by PT    Recommendations for Other Services       Precautions / Restrictions Precautions Precautions: Fall Required Braces or Orthoses: Knee Immobilizer - Left Knee Immobilizer - Left: Discontinue once straight leg raise with < 10 degree lag Restrictions Weight Bearing Restrictions: No Other Position/Activity Restrictions: WBAT    Mobility  Bed Mobility               General bed mobility comments: pt in recliner on arrival   Transfers Overall transfer level: Needs assistance Equipment used: Rolling walker (2 wheeled) Transfers: Sit to/from Stand Sit to Stand: Min guard;Supervision         General transfer comment: cues for hand placement and LLE position  Ambulation/Gait Ambulation/Gait assistance: Min guard Gait Distance (Feet): 90 Feet Assistive device: Rolling walker (2 wheeled) Gait Pattern/deviations: Step-to pattern;Decreased stance time - left;Decreased weight shift to left;Antalgic;Trunk flexed     General Gait Details: cues for sequence,posture  and RW position   Social research officer, government Rankin (Stroke Patients Only)       Balance                                            Cognition Arousal/Alertness: Awake/alert Behavior During Therapy: WFL for tasks assessed/performed Overall Cognitive Status: Within Functional Limits for tasks assessed                                         Exercises Total Joint Exercises Ankle Circles/Pumps: AROM;Both;10 reps Quad Sets: AROM;Both;10 reps Heel Slides: AAROM;Left;10 reps Hip ABduction/ADduction: AAROM;Left;10 reps Straight Leg Raises: AAROM;Left;10 reps Goniometric ROM: ~ 15 to 45 degrees L knee flexion, incr muscle guarding     General Comments        Pertinent Vitals/Pain Pain Assessment: 0-10 Pain Score: 4  Pain Location: left knee Pain Descriptors / Indicators: Aching;Grimacing;Sore Pain Intervention(s): Limited activity within patient's tolerance;Monitored during session;Premedicated before session;Repositioned;Ice applied    Home Living                      Prior Function            PT Goals (current goals can now be found in the care plan section) Acute Rehab PT Goals Patient Stated Goal: home PT Goal Formulation: With patient Time For Goal Achievement: 11/23/19 Potential to Achieve Goals: Good Progress towards PT goals: Progressing toward goals    Frequency    7X/week      PT Plan Current plan remains appropriate    Co-evaluation              AM-PAC PT "6 Clicks" Mobility   Outcome  Measure  Help needed turning from your back to your side while in a flat bed without using bedrails?: A Little Help needed moving from lying on your back to sitting on the side of a flat bed without using bedrails?: A Little Help needed moving to and from a bed to a chair (including a wheelchair)?: A Little Help needed standing up from a chair using your arms (e.g., wheelchair or bedside chair)?: A Little Help needed to walk in hospital room?: A Little Help needed climbing 3-5 steps with a railing? : A Lot 6 Click Score: 17    End of Session Equipment Utilized During Treatment: Gait belt;Left knee immobilizer Activity Tolerance: Patient tolerated treatment well Patient left: with family/visitor present;with call bell/phone within reach;in chair;with chair alarm  set Nurse Communication: Mobility status PT Visit Diagnosis: Difficulty in walking, not elsewhere classified (R26.2)     Time: 0347-4259 PT Time Calculation (min) (ACUTE ONLY): 24 min  Charges:  $Gait Training: 8-22 mins $Therapeutic Exercise: 8-22 mins                     Baxter Flattery, PT   Acute Rehab Dept [facility]): [phone]   11/18/2019    [facility] 11/18/2019, 12:03 PM

## 2019-11-18 NOTE — TOC Transition Note (Signed)
Transition of Care [facility]) - CM/SW Discharge Note   Patient Details  Name: Kathy Duran MRN: [id number] Date of Birth: Jun 13, 1946  [facility] CM/SW Contact:  Clearance Coots, LCSW Phone Number: 11/18/2019, 10:04 AM   Clinical Narrative:    Orthopedic Bundle Plan of Care: confirm with patient         Patient Goals and CMS Choice        Discharge Placement                       Discharge Plan and Services                DME Arranged: N/A DME Agency: NA       HH Arranged: NA HH Agency: NA        Social Determinants of Health (SDOH) Interventions     Readmission Risk Interventions No flowsheet data found.

## 2019-11-19 DIAGNOSIS — M25562 Pain in left knee: Secondary | ICD-10-CM | POA: Diagnosis not present

## 2019-11-19 DIAGNOSIS — M25662 Stiffness of left knee, not elsewhere classified: Secondary | ICD-10-CM | POA: Diagnosis not present

## 2019-11-24 ENCOUNTER — Other Ambulatory Visit (HOSPITAL_COMMUNITY): Payer: Self-pay | Admitting: Orthopedic Surgery

## 2019-11-24 ENCOUNTER — Ambulatory Visit (HOSPITAL_COMMUNITY)
Admission: RE | Admit: 2019-11-24 | Discharge: 2019-11-24 | Disposition: A | Discharge status: Home / Self Care | Payer: Medicare PPO | Source: Ambulatory Visit | Attending: Orthopedic Surgery | Admitting: Orthopedic Surgery

## 2019-11-24 ENCOUNTER — Other Ambulatory Visit: Payer: Self-pay

## 2019-11-24 DIAGNOSIS — M79605 Pain in left leg: Secondary | ICD-10-CM | POA: Insufficient documentation

## 2019-11-24 DIAGNOSIS — M7989 Other specified soft tissue disorders: Secondary | ICD-10-CM

## 2019-11-24 NOTE — Progress Notes (Signed)
Left lower extremity venous duplex completed. Refer to "CV Proc" under chart review to view preliminary results.  11/24/2019 2:01 PM Eula Fried., MHA, RVT, RDCS, RDMS

## 2019-11-27 DIAGNOSIS — M25562 Pain in left knee: Secondary | ICD-10-CM | POA: Diagnosis not present

## 2019-11-27 DIAGNOSIS — M25662 Stiffness of left knee, not elsewhere classified: Secondary | ICD-10-CM | POA: Diagnosis not present

## 2019-12-01 DIAGNOSIS — M25562 Pain in left knee: Secondary | ICD-10-CM | POA: Diagnosis not present

## 2019-12-01 DIAGNOSIS — M25662 Stiffness of left knee, not elsewhere classified: Secondary | ICD-10-CM | POA: Diagnosis not present

## 2019-12-04 DIAGNOSIS — M25562 Pain in left knee: Secondary | ICD-10-CM | POA: Diagnosis not present

## 2019-12-04 DIAGNOSIS — M25662 Stiffness of left knee, not elsewhere classified: Secondary | ICD-10-CM | POA: Diagnosis not present

## 2019-12-08 DIAGNOSIS — M25562 Pain in left knee: Secondary | ICD-10-CM | POA: Diagnosis not present

## 2019-12-08 DIAGNOSIS — M25662 Stiffness of left knee, not elsewhere classified: Secondary | ICD-10-CM | POA: Diagnosis not present

## 2019-12-11 DIAGNOSIS — M25662 Stiffness of left knee, not elsewhere classified: Secondary | ICD-10-CM | POA: Diagnosis not present

## 2019-12-11 DIAGNOSIS — M25562 Pain in left knee: Secondary | ICD-10-CM | POA: Diagnosis not present

## 2019-12-15 DIAGNOSIS — M25562 Pain in left knee: Secondary | ICD-10-CM | POA: Diagnosis not present

## 2019-12-15 DIAGNOSIS — M25662 Stiffness of left knee, not elsewhere classified: Secondary | ICD-10-CM | POA: Diagnosis not present

## 2019-12-18 DIAGNOSIS — M25662 Stiffness of left knee, not elsewhere classified: Secondary | ICD-10-CM | POA: Diagnosis not present

## 2019-12-18 DIAGNOSIS — M25562 Pain in left knee: Secondary | ICD-10-CM | POA: Diagnosis not present

## 2019-12-22 DIAGNOSIS — Z471 Aftercare following joint replacement surgery: Secondary | ICD-10-CM | POA: Diagnosis not present

## 2019-12-22 DIAGNOSIS — Z96652 Presence of left artificial knee joint: Secondary | ICD-10-CM | POA: Diagnosis not present

## 2019-12-29 DIAGNOSIS — M25562 Pain in left knee: Secondary | ICD-10-CM | POA: Diagnosis not present

## 2019-12-29 DIAGNOSIS — M25662 Stiffness of left knee, not elsewhere classified: Secondary | ICD-10-CM | POA: Diagnosis not present

## 2020-01-01 DIAGNOSIS — M25662 Stiffness of left knee, not elsewhere classified: Secondary | ICD-10-CM | POA: Diagnosis not present

## 2020-01-01 DIAGNOSIS — M25562 Pain in left knee: Secondary | ICD-10-CM | POA: Diagnosis not present

## 2020-01-04 ENCOUNTER — Other Ambulatory Visit: Payer: Self-pay | Admitting: Internal Medicine

## 2020-01-04 DIAGNOSIS — M5412 Radiculopathy, cervical region: Secondary | ICD-10-CM

## 2020-01-04 DIAGNOSIS — M5090 Cervical disc disorder, unspecified, unspecified cervical region: Secondary | ICD-10-CM | POA: Diagnosis not present

## 2020-01-05 DIAGNOSIS — M25562 Pain in left knee: Secondary | ICD-10-CM | POA: Diagnosis not present

## 2020-01-05 DIAGNOSIS — M25662 Stiffness of left knee, not elsewhere classified: Secondary | ICD-10-CM | POA: Diagnosis not present

## 2020-01-08 DIAGNOSIS — M25562 Pain in left knee: Secondary | ICD-10-CM | POA: Diagnosis not present

## 2020-01-08 DIAGNOSIS — M25662 Stiffness of left knee, not elsewhere classified: Secondary | ICD-10-CM | POA: Diagnosis not present

## 2020-01-12 DIAGNOSIS — M25662 Stiffness of left knee, not elsewhere classified: Secondary | ICD-10-CM | POA: Diagnosis not present

## 2020-01-12 DIAGNOSIS — M25562 Pain in left knee: Secondary | ICD-10-CM | POA: Diagnosis not present

## 2020-01-13 DIAGNOSIS — G5600 Carpal tunnel syndrome, unspecified upper limb: Secondary | ICD-10-CM | POA: Diagnosis not present

## 2020-01-19 DIAGNOSIS — M25662 Stiffness of left knee, not elsewhere classified: Secondary | ICD-10-CM | POA: Diagnosis not present

## 2020-01-19 DIAGNOSIS — M25562 Pain in left knee: Secondary | ICD-10-CM | POA: Diagnosis not present

## 2020-01-20 DIAGNOSIS — N3941 Urge incontinence: Secondary | ICD-10-CM | POA: Diagnosis not present

## 2020-01-20 DIAGNOSIS — N3281 Overactive bladder: Secondary | ICD-10-CM | POA: Diagnosis not present

## 2020-01-20 DIAGNOSIS — N952 Postmenopausal atrophic vaginitis: Secondary | ICD-10-CM | POA: Diagnosis not present

## 2020-01-22 DIAGNOSIS — M25562 Pain in left knee: Secondary | ICD-10-CM | POA: Diagnosis not present

## 2020-01-22 DIAGNOSIS — M25662 Stiffness of left knee, not elsewhere classified: Secondary | ICD-10-CM | POA: Diagnosis not present

## 2020-01-27 DIAGNOSIS — M25562 Pain in left knee: Secondary | ICD-10-CM | POA: Diagnosis not present

## 2020-01-27 DIAGNOSIS — M25662 Stiffness of left knee, not elsewhere classified: Secondary | ICD-10-CM | POA: Diagnosis not present

## 2020-02-05 ENCOUNTER — Other Ambulatory Visit: Payer: Medicare PPO

## 2020-02-05 DIAGNOSIS — M25562 Pain in left knee: Secondary | ICD-10-CM | POA: Diagnosis not present

## 2020-02-05 DIAGNOSIS — M25662 Stiffness of left knee, not elsewhere classified: Secondary | ICD-10-CM | POA: Diagnosis not present

## 2020-02-09 DIAGNOSIS — G5602 Carpal tunnel syndrome, left upper limb: Secondary | ICD-10-CM | POA: Diagnosis not present

## 2020-02-12 DIAGNOSIS — M25662 Stiffness of left knee, not elsewhere classified: Secondary | ICD-10-CM | POA: Diagnosis not present

## 2020-02-12 DIAGNOSIS — M25562 Pain in left knee: Secondary | ICD-10-CM | POA: Diagnosis not present

## 2020-02-16 DIAGNOSIS — M25662 Stiffness of left knee, not elsewhere classified: Secondary | ICD-10-CM | POA: Diagnosis not present

## 2020-02-16 DIAGNOSIS — M25562 Pain in left knee: Secondary | ICD-10-CM | POA: Diagnosis not present

## 2020-02-17 DIAGNOSIS — G5602 Carpal tunnel syndrome, left upper limb: Secondary | ICD-10-CM | POA: Diagnosis not present

## 2020-02-23 DIAGNOSIS — M25662 Stiffness of left knee, not elsewhere classified: Secondary | ICD-10-CM | POA: Diagnosis not present

## 2020-02-23 DIAGNOSIS — M25562 Pain in left knee: Secondary | ICD-10-CM | POA: Diagnosis not present

## 2020-02-25 DIAGNOSIS — Z471 Aftercare following joint replacement surgery: Secondary | ICD-10-CM | POA: Diagnosis not present

## 2020-02-25 DIAGNOSIS — Z96652 Presence of left artificial knee joint: Secondary | ICD-10-CM | POA: Diagnosis not present

## 2020-02-26 DIAGNOSIS — M25662 Stiffness of left knee, not elsewhere classified: Secondary | ICD-10-CM | POA: Diagnosis not present

## 2020-02-26 DIAGNOSIS — M25562 Pain in left knee: Secondary | ICD-10-CM | POA: Diagnosis not present

## 2020-03-01 DIAGNOSIS — G5602 Carpal tunnel syndrome, left upper limb: Secondary | ICD-10-CM | POA: Diagnosis not present

## 2020-03-01 DIAGNOSIS — M25562 Pain in left knee: Secondary | ICD-10-CM | POA: Diagnosis not present

## 2020-03-01 DIAGNOSIS — M25662 Stiffness of left knee, not elsewhere classified: Secondary | ICD-10-CM | POA: Diagnosis not present

## 2020-03-03 DIAGNOSIS — M25562 Pain in left knee: Secondary | ICD-10-CM | POA: Diagnosis not present

## 2020-03-03 DIAGNOSIS — M25662 Stiffness of left knee, not elsewhere classified: Secondary | ICD-10-CM | POA: Diagnosis not present

## 2020-03-09 DIAGNOSIS — I10 Essential (primary) hypertension: Secondary | ICD-10-CM | POA: Diagnosis not present

## 2020-03-09 DIAGNOSIS — M5412 Radiculopathy, cervical region: Secondary | ICD-10-CM | POA: Diagnosis not present

## 2020-03-09 DIAGNOSIS — Z6835 Body mass index (BMI) 35.0-35.9, adult: Secondary | ICD-10-CM | POA: Diagnosis not present

## 2020-03-16 DIAGNOSIS — G5602 Carpal tunnel syndrome, left upper limb: Secondary | ICD-10-CM | POA: Diagnosis not present

## 2020-03-21 ENCOUNTER — Other Ambulatory Visit (INDEPENDENT_AMBULATORY_CARE_PROVIDER_SITE_OTHER): Payer: Self-pay | Admitting: Orthopaedic Surgery

## 2020-05-10 DIAGNOSIS — J45901 Unspecified asthma with (acute) exacerbation: Secondary | ICD-10-CM | POA: Diagnosis not present

## 2020-06-20 ENCOUNTER — Other Ambulatory Visit: Payer: Self-pay | Admitting: Internal Medicine

## 2020-06-20 DIAGNOSIS — Z1231 Encounter for screening mammogram for malignant neoplasm of breast: Secondary | ICD-10-CM

## 2020-08-02 ENCOUNTER — Ambulatory Visit
Admission: RE | Admit: 2020-08-02 | Discharge: 2020-08-02 | Disposition: A | Discharge status: Home / Self Care | Payer: Medicare PPO | Source: Ambulatory Visit | Attending: Internal Medicine | Admitting: Internal Medicine

## 2020-08-02 ENCOUNTER — Other Ambulatory Visit: Payer: Self-pay

## 2020-08-02 DIAGNOSIS — Z1231 Encounter for screening mammogram for malignant neoplasm of breast: Secondary | ICD-10-CM

## 2020-08-04 ENCOUNTER — Other Ambulatory Visit: Payer: Self-pay | Admitting: Internal Medicine

## 2020-08-04 ENCOUNTER — Ambulatory Visit
Admission: RE | Admit: 2020-08-04 | Discharge: 2020-08-04 | Disposition: A | Discharge status: Home / Self Care | Payer: Medicare PPO | Source: Ambulatory Visit | Attending: Internal Medicine | Admitting: Internal Medicine

## 2020-08-04 DIAGNOSIS — M25512 Pain in left shoulder: Secondary | ICD-10-CM

## 2020-08-04 DIAGNOSIS — M19012 Primary osteoarthritis, left shoulder: Secondary | ICD-10-CM | POA: Diagnosis not present

## 2020-08-15 DIAGNOSIS — N3941 Urge incontinence: Secondary | ICD-10-CM | POA: Diagnosis not present

## 2020-08-15 DIAGNOSIS — M5090 Cervical disc disorder, unspecified, unspecified cervical region: Secondary | ICD-10-CM | POA: Diagnosis not present

## 2020-08-15 DIAGNOSIS — Z0000 Encounter for general adult medical examination without abnormal findings: Secondary | ICD-10-CM | POA: Diagnosis not present

## 2020-08-15 DIAGNOSIS — E559 Vitamin D deficiency, unspecified: Secondary | ICD-10-CM | POA: Diagnosis not present

## 2020-08-15 DIAGNOSIS — E782 Mixed hyperlipidemia: Secondary | ICD-10-CM | POA: Diagnosis not present

## 2020-08-15 DIAGNOSIS — J45909 Unspecified asthma, uncomplicated: Secondary | ICD-10-CM | POA: Diagnosis not present

## 2020-08-15 DIAGNOSIS — R7309 Other abnormal glucose: Secondary | ICD-10-CM | POA: Diagnosis not present

## 2020-08-15 DIAGNOSIS — Z96652 Presence of left artificial knee joint: Secondary | ICD-10-CM | POA: Diagnosis not present

## 2020-08-15 DIAGNOSIS — M3500 Sicca syndrome, unspecified: Secondary | ICD-10-CM | POA: Diagnosis not present

## 2020-08-15 DIAGNOSIS — I10 Essential (primary) hypertension: Secondary | ICD-10-CM | POA: Diagnosis not present

## 2020-08-22 DIAGNOSIS — R0602 Shortness of breath: Secondary | ICD-10-CM | POA: Diagnosis not present

## 2020-08-22 DIAGNOSIS — R059 Cough, unspecified: Secondary | ICD-10-CM | POA: Diagnosis not present

## 2020-08-22 DIAGNOSIS — R6883 Chills (without fever): Secondary | ICD-10-CM | POA: Diagnosis not present

## 2020-08-22 DIAGNOSIS — Z8709 Personal history of other diseases of the respiratory system: Secondary | ICD-10-CM | POA: Diagnosis not present

## 2020-08-31 DIAGNOSIS — M25562 Pain in left knee: Secondary | ICD-10-CM | POA: Diagnosis not present

## 2020-08-31 DIAGNOSIS — M25662 Stiffness of left knee, not elsewhere classified: Secondary | ICD-10-CM | POA: Diagnosis not present

## 2020-09-02 DIAGNOSIS — M25662 Stiffness of left knee, not elsewhere classified: Secondary | ICD-10-CM | POA: Diagnosis not present

## 2020-09-02 DIAGNOSIS — M25562 Pain in left knee: Secondary | ICD-10-CM | POA: Diagnosis not present

## 2020-09-07 DIAGNOSIS — M25662 Stiffness of left knee, not elsewhere classified: Secondary | ICD-10-CM | POA: Diagnosis not present

## 2020-09-07 DIAGNOSIS — M25562 Pain in left knee: Secondary | ICD-10-CM | POA: Diagnosis not present

## 2020-09-08 NOTE — Progress Notes (Signed)
Cardiology Office Note:    Date:  09/09/2020   ID:  Kathy Duran, DOB 1946/02/05, MRN [id number]  PCP:  Georgann Housekeeper, MD  Cardiologist:  Chrystie Nose, MD  Electrophysiologist:  None   Referring MD: Georgann Housekeeper, MD   Chief Complaint: shortness of breath and medication concerns  History of Present Illness:    Kathy Duran is a 75 y.o. female with a history of chronic lower extremity edema, varicose veins, hypertension, dyslipidemia, asthma, GERD, Sjogren's syndrome, rheumatoid arthritis, fibromyalgia, and low back pain who is followed by Dr. Rennis Golden and presents today for evaluation of shortness of breath and medication concerns.  Patient was initially seen by Dr. Rennis Golden in 11/2013 for further evaluation of lower extremity edema. She denied any shortness of breath or chest pain at that time. Lower extremity venous duplex were ordered and were negative for DVT as well as any venous reflux. BNP was low. Edema was ultimately felt to be due to her neuropathy. HCTZ was recommended to help with the edema and she was advised to follow-up as needed. She was seen again in 03/2015 due to concerns of continued lower extremity swelling. Echo was ordered and showed LVEF of 60-65% with mild LVH and grade 1 diastolic dysfunction. Again, lower extremity edema was not felt to be due to cardiac etiology. Compression stockings were recommended. Patient was last seen by Dr. Cyndie Chime in 10/2019 for pre-op evaluation for upcoming knee surgery after not having been seen since 2018. She was doing well from a cardiac standpoint at that time and felt to be at acceptable risk for surgery.  Patient presents today for further evaluation of shortness of breath and medications concerns. Patient states her PCP recently changed her Losartan 50mg  daily to Losartan-HCTZ 50-12.5mg  daily in late January due to elevated BP and lower extremity edema. BP has been mostly well controlled with this. She had one isolated low reading of  74/46 but otherwise BP ranging from the 110's-130's/50's-70's For the last several days on this medication, she has had a severe headache which she describes as a "migraine" (although no history of migraine headaches). One day, she reported some trouble getting out of her words. No other stroke symptoms. She went back to taking Losartan 50mg  only this morning and she has not had a headache. She also reports recent dyspnea. She does have a history of asthma and has maintenance and rescue inhalers. However, she states her breathing has been worse than usual and does not feel like an asthma flare. She describes stable orthopnea but no PND. She has chronic lower extremity edema but this has been stable recently (right leg chronically larger than left). She also reports some chest discomfort that she describes as "indigestion." She also describes it as a "congestion tightness." Occurs at both rest and exertion. Worse after eating some foods. She is also very concerned about the diaphoresis she has been having over the last 4-5 months. She states with very minimal activities, such as getting dressed, she will start pouring sweat. Chest pain is not associated with this. She has some palpitations when she is very anxious/stressed. She is currently helping take care of her mother and sister and this is causing her a lot of stress. No significant dizziness or syncope.   Past Medical History:  Diagnosis Date  . Anxiety   . Arthritis    left hip  . Asthma    Inhalers for tx  . Bilateral primary osteoarthritis of knee 07/06/2016  Moderate  . Bilateral swelling of feet    bilateral ankle and feet edema  . Bruises easily   . Complication of anesthesia    sometimes needs oxygen when waking uo due to asthma  . DDD (degenerative disc disease), lumbar 07/06/2016  . Fibromyalgia   . GERD (gastroesophageal reflux disease)   . Hypertension   . Myofacial muscle pain 07/06/2016  . Osteoarthritis of both hands  07/06/2016  . Seasonal allergies 07/06/2016  . Sinus complaint   . Sjogren's syndrome (HCC)    dry eyes  . Tenonitis   . Urinary incontinence    occ. an issue wears pads    Past Surgical History:  Procedure Laterality Date  . ABDOMINAL HYSTERECTOMY  1986  . BACK SURGERY  1985  . BREAST CYST ASPIRATION    . BREAST SURGERY Right    cyst drainage  . CARPAL TUNNEL RELEASE Right 2001  . KNEE SURGERY  1991  . SHOULDER SURGERY Right    right shoulder tendon and rotator cuff repair  . TOTAL HIP ARTHROPLASTY Right 2006  . TOTAL HIP ARTHROPLASTY Left 02/24/2016   Procedure: LEFT TOTAL HIP ARTHROPLASTY ANTERIOR APPROACH;  Surgeon: Kathryne Hitch, MD;  Location: [facility];  Service: Orthopedics;  Laterality: Left;  . TOTAL KNEE ARTHROPLASTY Left 11/16/2019   Procedure: TOTAL KNEE ARTHROPLASTY;  Surgeon: Ollen Gross, MD;  Location: [facility];  Service: Orthopedics;  Laterality: Left;     Current Medications: Current Meds  Medication Sig  . albuterol (PROVENTIL) (2.5 MG/3ML) 0.083% nebulizer solution Take 2.5 mg by nebulization as needed for wheezing.  Marland Kitchen albuterol (VENTOLIN HFA) 108 (90 Base) MCG/ACT inhaler Inhale 1 puff into the lungs every 4 (four) hours as needed for wheezing or shortness of breath.  Mar[address] Kitchen atorvastatin (LIPITOR) 40 MG tablet TAKE 1 TABLET BY MOUTH EVERY DAY (Patient taking differently: Take 40 mg by mouth every evening.)  . cetirizine (ZYRTEC) 10 MG tablet Take 10 mg by mouth daily.  . Cholecalciferol (VITAMIN D3) 50 MCG (2000 UT) TABS Take 2,000-4,000 Units by mouth daily.  . cyclobenzaprine (FLEXERIL) 10 MG tablet TAKE 1 TABLET (10 MG TOTAL) BY MOUTH 2 (TWO) TIMES DAILY AS NEEDED (SPASMS.).  Marland Kitchen estradiol (VIVELLE-DOT) 0.1 MG/24HR patch Place 1 patch onto the skin 2 (two) times a week. Sundays & Wednesdays.  . Fluticasone-Salmeterol (ADVAIR) 250-50 MCG/DOSE AEPB Inhale 1 puff into the lungs 2 (two) times daily.  . furosemide (LASIX) 20 MG tablet Take 1 tablet (20 mg  total) by mouth daily.  Marland Kitchen gabapentin (NEURONTIN) 300 MG capsule Take a 300 mg capsule three times a day for two weeks following surgery.Then take a 300 mg capsule two times a day for two weeks. Then take a 300 mg capsule once a day for two weeks. Then discontinue.  . Menthol-Methyl Salicylate (SALONPAS PAIN RELIEF PATCH EX) Place 1 patch onto the skin 2 (two) times daily as needed (pain.).  Marland Kitchen methocarbamol (ROBAXIN) 500 MG tablet Take 1 tablet (500 mg total) by mouth every 6 (six) hours as needed for muscle spasms.  . mometasone (NASONEX) 50 MCG/ACT nasal spray Place 2 sprays into the nose daily as needed (allergies.).   Marland Kitchen Multiple Vitamin (MULTIVITAMIN WITH MINERALS) TABS tablet Take 1 tablet by mouth daily.  . ondansetron (ZOFRAN-ODT) 8 MG disintegrating tablet Take 4 mg by mouth every 8 (eight) hours as needed (nausea/vomiting).   Marland Kitchen oxyCODONE (OXY IR/ROXICODONE) 5 MG immediate release tablet Take 1-2 tablets (5-10 mg total) by mouth every 6 (  six) hours as needed for severe pain.  . pantoprazole (PROTONIX) 40 MG tablet Take 40 mg by mouth daily as needed (reflux).   . RESTASIS 0.05 % ophthalmic emulsion Place 1 drop into both eyes 2 (two) times daily.  . traMADol (ULTRAM) 50 MG tablet Take 1-2 tablets (50-100 mg total) by mouth every 6 (six) hours as needed for moderate pain.  Marland Kitchen YUVAFEM 10 MCG TABS vaginal tablet Place 10 mcg vaginally 2 (two) times a week. Tuesdays & Thursdays.  Marland Kitchen zolpidem (AMBIEN) 10 MG tablet Take 10 mg by mouth at bedtime as needed for sleep.   . [DISCONTINUED] losartan (COZAAR) 50 MG tablet Take 50 mg by mouth daily.     Allergies:   Losartan potassium-hctz, Amoxicillin-pot clavulanate, Diclofenac-misoprostol, Latex, and Naproxen   Social History   Socioeconomic History  . Marital status: Married    Spouse name: Not on file  . Number of children: 1  . Years of education: Not on file  . Highest education level: Not on file  Occupational History  . Not on file   Tobacco Use  . Smoking status: Former Smoker    Quit date: 11/21/1983    Years since quitting: 36.8  . Smokeless tobacco: Never Used  Vaping Use  . Vaping Use: Never used  Substance and Sexual Activity  . Alcohol use: No  . Drug use: No  . Sexual activity: Not Currently  Other Topics Concern  . Not on file  Social History Narrative  . Not on file   Social Determinants of Health   Financial Resource Strain: Not on file  Food Insecurity: Not on file  Transportation Needs: Not on file  Physical Activity: Not on file  Stress: Not on file  Social Connections: Not on file     Family History: The patient's family history includes Allergies in her daughter; Arrhythmia in her mother; Cancer in her father; Dementia in her sister; Diabetes in her father, mother, and sister; Healthy in her daughter; Heart attack in her father and paternal grandfather; Hyperlipidemia in her maternal grandmother and mother; Hypertension in her father, maternal grandmother, and mother; Lung disease in her brother; Stroke in her mother and paternal grandfather.  ROS:   Please see the history of present illness.     EKGs/Labs/Other Studies Reviewed:    The following studies were reviewed today:  Echocardiogram 05/09/2015: Study Conclusions: - Left ventricle: The cavity size was normal. Wall thickness was  increased in a pattern of mild LVH. Systolic function was normal.  The estimated ejection fraction was in the range of 60% to 65%.  Wall motion was normal; there were no regional wall motion  abnormalities. Doppler parameters are consistent with abnormal  left ventricular relaxation (grade 1 diastolic dysfunction).  - Aortic valve: There was trivial regurgitation.  - Mitral valve: There was no regurgitation.  - Left atrium: The atrium was normal in size.  - Right ventricle: The cavity size was normal. Wall thickness was  normal. Systolic function was normal.  - Atrial septum: No defect or  patent foramen ovale was identified.  There was an atrial septal aneurysm.  - Tricuspid valve: There was mild regurgitation.  - Pulmonary arteries: PA peak pressure: 22 mm Hg (S).  - Inferior vena cava: The vessel was normal in size. The  respirophasic diameter changes were in the normal range (= 50%),  consistent with normal central venous pressure.  EKG:  EKG ordered today. EKG personally reviewed and demonstrates: Normal sinus rhythm, rate  95 bpm, with no acute ST/T changes. Normal PR and QRS intervals. Normal axis. QTc 442 ms..  Recent Labs: 11/06/2019: ALT 32 11/18/2019: BUN 10; Creatinine, Ser 0.67; Hemoglobin 11.4; Platelets 303; Potassium 4.2; Sodium 140  Recent Lipid Panel    Component Value Date/Time   CHOL 245 (H) 04/25/2015 0955   CHOL 212 (H) 12/01/2013 1142   TRIG 47 04/25/2015 0955   TRIG 83 12/01/2013 1142   HDL 107 04/25/2015 0955   HDL 81 12/01/2013 1142   CHOLHDL 2.3 04/25/2015 0955   LDLCALC 129 04/25/2015 0955   LDLCALC 114 (H) 12/01/2013 1142    Physical Exam:    Vital Signs: BP 120/88   Pulse 95   Ht 5\' 5"  (1.651 m)   Wt 217 lb (98.4 kg)   SpO2 98%   BMI 36.11 kg/m     Wt Readings from Last 3 Encounters:  09/09/20 217 lb (98.4 kg)  11/16/19 217 lb 6 oz (98.6 kg)  11/06/19 217 lb 6 oz (98.6 kg)     General: 75 y.o. female in no acute distress. HEENT: Normocephalic and atraumatic. Sclera clear.  Neck: Supple. No carotid bruits. No JVD. Heart: RRR. Distinct S1 and S2. No murmurs, gallops, or rubs. Radial pulses 2+ and equal bilaterally. Lungs: No increased work of breathing. Clear to ausculation bilaterally. No wheezes, rhonchi, or rales.  Abdomen: Soft, non-distended, and non-tender to palpation.  Extremities: Mild lower extremity edema (right > left).    Skin: Warm and dry. Neuro: Alert and oriented x3. No focal deficits. Psych: Normal affect. Responds appropriately.  Assessment:    1. Hypertension, unspecified type   2. Mixed  hyperlipidemia   3. Chest pain of uncertain etiology   4. DOE (dyspnea on exertion)     Plan:    Dyspnea on Exertion - Patient reports worsening shortness of breath lately.  - She has chronic lower extremity edema but otherwise does not feel volume overloaded on exam. - Will check BNP and Echo to evaluate for CHF. Will also check BMET. - Will start Lasix for lower extremity edema. - Discussed importance of daily weights and sodium/fluid restrictions.  Chronic Lower Extremity Edema - Stable. She noted minimally improved when HCTZ was added to Losartan. - Will stop HCTZ and start Lasix 20mg  daily.  - Continue compression stockings. - Recommend sodium/fluid restrictions.  - Will check BMET today and again in 1 week after staring Lasix.  Of note, patient does have Sjogren's syndrome. Lasix may exacerbate these symptom. Discussed this with patient.  Chest Pain - Patient reports chest discomfort that she describes as both "indigestion" and "congestion tightness" Occurs at rest and with exertion. - EKG shows no acute changes.  - Chest pain sounds atypical. Possibe GERD. However, patient has also had episode of diaphoresis with minimal activity. After discussion with patient, we will proceed with [facility]. Will also have patient start Protonix 40mg  daily to see if that helps.  Shared Decision Making/Informed Consent{ The risks [chest pain, shortness of breath, cardiac arrhythmias, dizziness, blood pressure fluctuations, myocardial infarction, stroke/transient ischemic attack, nausea, vomiting, allergic reaction, radiation exposure, metallic taste sensation and life-threatening complications (estimated to be 1 in 10,000)], benefits (risk stratification, diagnosing coronary artery disease, treatment guidance) and alternatives of a nuclear stress test were discussed in detail with Ms. Sahm and she agrees to proceed.  Hypertension - BP well controlled.  - Will stop Losartan-HCTZ given  severe headaches with the addition of HCTZ. Will restart Losartan 50mg  daily. - Asked  patient to continue to monitor BP at home to make sure BP stays well controlled off HCTZ.  Dyslipidemia - Continue Lipitor 40mg  daily.  - Followed by PCP.   Disposition: Follow up with me in 1 month after above tests.   Medication Adjustments/Labs and Tests Ordered: Current medicines are reviewed at length with the patient today.  Concerns regarding medicines are outlined above.  Orders Placed This Encounter  Procedures  . Basic metabolic panel  . Pro b natriuretic peptide (BNP)[facility])  . Basic Metabolic Panel (BMET)  . Cardiac Stress Test: Informed Consent Details: Physician/Practitioner Attestation; Transcribe to consent form and obtain patient signature  . MYOCARDIAL PERFUSION IMAGING  . EKG 12-Lead  . ECHOCARDIOGRAM COMPLETE   Meds ordered this encounter  Medications  . losartan (COZAAR) 50 MG tablet    Sig: Take 1 tablet (50 mg total) by mouth daily.    Dispense:  90 tablet    Refill:  3  . furosemide (LASIX) 20 MG tablet    Sig: Take 1 tablet (20 mg total) by mouth daily.    Dispense:  90 tablet    Refill:  3    Patient Instructions  Medication Instructions:  Start Lasix 20 mg (1 Tablet Daily). Start Losartan 50mg  (1 Tablet Daily) *If you need a refill on your cardiac medications before your next appointment, please call your pharmacy*   Lab Work: BMP,BNP If you have labs (blood work) drawn today and your tests are completely normal, you will receive your results only by: MyChart Message (if you have MyChart) OR . A paper copy in the mail If you have any lab test that is abnormal or we need to change your treatment, we will call you to review the results.   Testing/Procedures: [address]  Your physician has requested that you have an echocardiogram. Echocardiography is a painless test that uses sound waves to create images of  your heart. It provides your doctor with information about the size and shape of your heart and how well your heart's chambers and valves are working. This procedure takes approximately one hour. There are no restrictions for this procedure.  [address], [address] Your physician has requested that you have a lexiscan myoview. For further information please visit [address] Please follow instruction sheet, as given.    Follow-Up: At [facility], you and your health needs are our priority.  As part of our continuing mission to provide you with exceptional heart care, we have created designated Provider Care Teams.  These Care Teams include your primary Cardiologist (physician) and Advanced Practice Providers (APPs -  Physician Assistants and Nurse Practitioners) who all work together to provide you with the care you need, when you need it.   Your next appointment:   October 21, 2020 11:45 AM  The format for your next appointment:   In Person  Provider:   [facility] PA_[facility]   Other Instructions Limit Sodium intake. Limit fluid intake less than 64 oz daily    Signed, October 23, 2020  09/09/2020 6:39 PM    [facility]

## 2020-09-09 ENCOUNTER — Encounter: Payer: Self-pay | Admitting: Internal Medicine

## 2020-09-09 ENCOUNTER — Ambulatory Visit (INDEPENDENT_AMBULATORY_CARE_PROVIDER_SITE_OTHER): Payer: Medicare PPO | Admitting: Student

## 2020-09-09 ENCOUNTER — Other Ambulatory Visit: Payer: Self-pay

## 2020-09-09 ENCOUNTER — Encounter: Payer: Self-pay | Admitting: Student

## 2020-09-09 VITALS — BP 120/88 | HR 95 | Ht 65.0 in | Wt 217.0 lb

## 2020-09-09 DIAGNOSIS — R600 Localized edema: Secondary | ICD-10-CM | POA: Diagnosis not present

## 2020-09-09 DIAGNOSIS — E782 Mixed hyperlipidemia: Secondary | ICD-10-CM

## 2020-09-09 DIAGNOSIS — I10 Essential (primary) hypertension: Secondary | ICD-10-CM | POA: Diagnosis not present

## 2020-09-09 DIAGNOSIS — R0600 Dyspnea, unspecified: Secondary | ICD-10-CM

## 2020-09-09 DIAGNOSIS — R079 Chest pain, unspecified: Secondary | ICD-10-CM

## 2020-09-09 DIAGNOSIS — K219 Gastro-esophageal reflux disease without esophagitis: Secondary | ICD-10-CM | POA: Diagnosis not present

## 2020-09-09 DIAGNOSIS — R0609 Other forms of dyspnea: Secondary | ICD-10-CM

## 2020-09-09 MED ORDER — PANTOPRAZOLE SODIUM 40 MG PO TBEC: 40.0000 mg | DELAYED_RELEASE_TABLET | Freq: Every day | ORAL | 2 refills | Status: AC

## 2020-09-09 MED ORDER — FUROSEMIDE 20 MG PO TABS
20.0000 mg | ORAL_TABLET | Freq: Every day | ORAL | 3 refills | Status: DC
Start: 2020-09-09 — End: 2021-02-13

## 2020-09-09 MED ORDER — LOSARTAN POTASSIUM 50 MG PO TABS: 50.0000 mg | ORAL_TABLET | Freq: Every day | ORAL | 3 refills | Status: DC

## 2020-09-09 NOTE — Patient Instructions (Signed)
Medication Instructions:  Start Lasix 20 mg (1 Tablet Daily). Start Losartan 50mg  (1 Tablet Daily) *If you need a refill on your cardiac medications before your next appointment, please call your pharmacy*   Lab Work: BMP,BNP If you have labs (blood work) drawn today and your tests are completely normal, you will receive your results only by: MyChart Message (if you have MyChart) OR . A paper copy in the mail If you have any lab test that is abnormal or we need to change your treatment, we will call you to review the results.   Testing/Procedures: [address], [address]  Your physician has requested that you have an echocardiogram. Echocardiography is a painless test that uses sound waves to create images of your heart. It provides your doctor with information about the size and shape of your heart and how well your heart's chambers and valves are working. This procedure takes approximately one hour. There are no restrictions for this procedure.  [address], [address] Your physician has requested that you have a lexiscan myoview. For further information please visit [employer]. Please follow instruction sheet, as given.    Follow-Up: At [facility], you and your health needs are our priority.  As part of our continuing mission to provide you with exceptional heart care, we have created designated Provider Care Teams.  These Care Teams include your primary Cardiologist (physician) and Advanced Practice Providers (APPs -  Physician Assistants and Nurse Practitioners) who all work together to provide you with the care you need, when you need it.   Your next appointment:   October 21, 2020 11:45 AM  The format for your next appointment:   In Person  Provider:   October 23, 2020 PA_c   Other Instructions Limit Sodium intake. Limit fluid intake less than 64 oz daily

## 2020-09-10 LAB — BASIC METABOLIC PANEL
BUN/Creatinine Ratio: 22 (ref 12–28)
BUN: 16 mg/dL (ref 8–27)
CO2: 22 mmol/L (ref 20–29)
Calcium: 10 mg/dL (ref 8.7–10.3)
Chloride: 102 mmol/L (ref 96–106)
Creatinine, Ser: 0.74 mg/dL (ref 0.57–1.00)
GFR calc Af Amer: 94 mL/min/{1.73_m2} (ref >59)
GFR calc non Af Amer: 81 mL/min/{1.73_m2} (ref >59)
Glucose: 88 mg/dL (ref 65–99)
Potassium: 4 mmol/L (ref 3.5–5.2)
Sodium: 141 mmol/L (ref 134–144)

## 2020-09-10 LAB — PRO B NATRIURETIC PEPTIDE: NT-Pro BNP: 29 pg/mL (ref 0–301)

## 2020-09-12 ENCOUNTER — Other Ambulatory Visit: Payer: Self-pay

## 2020-09-12 DIAGNOSIS — E782 Mixed hyperlipidemia: Secondary | ICD-10-CM

## 2020-09-12 DIAGNOSIS — Z79899 Other long term (current) drug therapy: Secondary | ICD-10-CM

## 2020-09-12 DIAGNOSIS — I10 Essential (primary) hypertension: Secondary | ICD-10-CM

## 2020-09-12 DIAGNOSIS — R0609 Other forms of dyspnea: Secondary | ICD-10-CM

## 2020-09-12 DIAGNOSIS — R0600 Dyspnea, unspecified: Secondary | ICD-10-CM

## 2020-09-12 DIAGNOSIS — S40011A Contusion of right shoulder, initial encounter: Secondary | ICD-10-CM | POA: Diagnosis not present

## 2020-09-16 DIAGNOSIS — M25562 Pain in left knee: Secondary | ICD-10-CM | POA: Diagnosis not present

## 2020-09-16 DIAGNOSIS — I10 Essential (primary) hypertension: Secondary | ICD-10-CM | POA: Diagnosis not present

## 2020-09-16 DIAGNOSIS — R0600 Dyspnea, unspecified: Secondary | ICD-10-CM | POA: Diagnosis not present

## 2020-09-16 DIAGNOSIS — M25662 Stiffness of left knee, not elsewhere classified: Secondary | ICD-10-CM | POA: Diagnosis not present

## 2020-09-16 DIAGNOSIS — E782 Mixed hyperlipidemia: Secondary | ICD-10-CM | POA: Diagnosis not present

## 2020-09-16 DIAGNOSIS — R079 Chest pain, unspecified: Secondary | ICD-10-CM | POA: Diagnosis not present

## 2020-09-17 LAB — BASIC METABOLIC PANEL
BUN/Creatinine Ratio: 19 (ref 12–28)
BUN: 15 mg/dL (ref 8–27)
CO2: 22 mmol/L (ref 20–29)
Calcium: 9.6 mg/dL (ref 8.7–10.3)
Chloride: 102 mmol/L (ref 96–106)
Creatinine, Ser: 0.77 mg/dL (ref 0.57–1.00)
GFR calc Af Amer: 89 mL/min/{1.73_m2} (ref >59)
GFR calc non Af Amer: 77 mL/min/{1.73_m2} (ref >59)
Glucose: 91 mg/dL (ref 65–99)
Potassium: 3.9 mmol/L (ref 3.5–5.2)
Sodium: 141 mmol/L (ref 134–144)

## 2020-09-19 DIAGNOSIS — M25662 Stiffness of left knee, not elsewhere classified: Secondary | ICD-10-CM | POA: Diagnosis not present

## 2020-09-19 DIAGNOSIS — M25562 Pain in left knee: Secondary | ICD-10-CM | POA: Diagnosis not present

## 2020-09-21 DIAGNOSIS — M25662 Stiffness of left knee, not elsewhere classified: Secondary | ICD-10-CM | POA: Diagnosis not present

## 2020-09-21 DIAGNOSIS — M25562 Pain in left knee: Secondary | ICD-10-CM | POA: Diagnosis not present

## 2020-09-23 DIAGNOSIS — M25562 Pain in left knee: Secondary | ICD-10-CM | POA: Diagnosis not present

## 2020-09-23 DIAGNOSIS — M25662 Stiffness of left knee, not elsewhere classified: Secondary | ICD-10-CM | POA: Diagnosis not present

## 2020-09-27 ENCOUNTER — Ambulatory Visit (HOSPITAL_COMMUNITY)
Admission: RE | Admit: 2020-09-27 | Discharge: 2020-09-27 | Disposition: A | Discharge status: Home / Self Care | Payer: Medicare PPO | Source: Ambulatory Visit | Attending: Internal Medicine | Admitting: Internal Medicine

## 2020-09-27 ENCOUNTER — Other Ambulatory Visit: Payer: Self-pay

## 2020-09-27 DIAGNOSIS — R079 Chest pain, unspecified: Secondary | ICD-10-CM | POA: Insufficient documentation

## 2020-09-27 DIAGNOSIS — R0600 Dyspnea, unspecified: Secondary | ICD-10-CM | POA: Diagnosis not present

## 2020-09-27 DIAGNOSIS — R0609 Other forms of dyspnea: Secondary | ICD-10-CM

## 2020-09-27 DIAGNOSIS — E782 Mixed hyperlipidemia: Secondary | ICD-10-CM | POA: Insufficient documentation

## 2020-09-27 DIAGNOSIS — I10 Essential (primary) hypertension: Secondary | ICD-10-CM | POA: Insufficient documentation

## 2020-09-27 LAB — MYOCARDIAL PERFUSION IMAGING
LV dias vol: 83 mL (ref 46–106)
LV sys vol: 29 mL
Peak HR: 91 {beats}/min
Rest HR: 78 {beats}/min
SDS: 0
SRS: 0
SSS: 0
TID: 1.24

## 2020-09-27 MED ORDER — TECHNETIUM TC 99M TETROFOSMIN IV KIT
31.9000 | PACK | Freq: Once | INTRAVENOUS | Status: AC | PRN
  Administered 2020-09-27: 31.9 via INTRAVENOUS
  Filled 2020-09-27: qty 32

## 2020-09-27 MED ORDER — TECHNETIUM TC 99M TETROFOSMIN IV KIT
10.2000 | PACK | Freq: Once | INTRAVENOUS | Status: AC | PRN
  Administered 2020-09-27: 10.2 via INTRAVENOUS
  Filled 2020-09-27: qty 11

## 2020-09-27 MED ORDER — REGADENOSON 0.4 MG/5ML IV SOLN
0.4000 mg | Freq: Once | INTRAVENOUS | Status: AC
Start: 2020-09-27 — End: 2020-09-27
  Administered 2020-09-27: 0.4 mg via INTRAVENOUS

## 2020-09-28 DIAGNOSIS — M25562 Pain in left knee: Secondary | ICD-10-CM | POA: Diagnosis not present

## 2020-09-28 DIAGNOSIS — M25662 Stiffness of left knee, not elsewhere classified: Secondary | ICD-10-CM | POA: Diagnosis not present

## 2020-09-30 DIAGNOSIS — M25562 Pain in left knee: Secondary | ICD-10-CM | POA: Diagnosis not present

## 2020-09-30 DIAGNOSIS — M25662 Stiffness of left knee, not elsewhere classified: Secondary | ICD-10-CM | POA: Diagnosis not present

## 2020-10-04 ENCOUNTER — Ambulatory Visit (HOSPITAL_COMMUNITY): Payer: Medicare PPO | Attending: Cardiovascular Disease

## 2020-10-04 ENCOUNTER — Other Ambulatory Visit: Payer: Self-pay

## 2020-10-04 DIAGNOSIS — I10 Essential (primary) hypertension: Secondary | ICD-10-CM | POA: Diagnosis not present

## 2020-10-04 DIAGNOSIS — E782 Mixed hyperlipidemia: Secondary | ICD-10-CM | POA: Insufficient documentation

## 2020-10-04 DIAGNOSIS — R079 Chest pain, unspecified: Secondary | ICD-10-CM | POA: Insufficient documentation

## 2020-10-04 DIAGNOSIS — R0600 Dyspnea, unspecified: Secondary | ICD-10-CM | POA: Diagnosis not present

## 2020-10-04 DIAGNOSIS — R0609 Other forms of dyspnea: Secondary | ICD-10-CM

## 2020-10-04 LAB — ECHOCARDIOGRAM COMPLETE
Area-P 1/2: 4.65 cm2
P 1/2 time: 344 msec
S' Lateral: 1.7 cm

## 2020-10-11 ENCOUNTER — Telehealth: Payer: Self-pay | Admitting: Orthopaedic Surgery

## 2020-10-11 NOTE — Telephone Encounter (Signed)
Spoke with [employer]. I explained that Dr.Whitfield premedicates all patients that have had any total joint replacements, before doing dental procedures.

## 2020-10-11 NOTE — Telephone Encounter (Signed)
LMOM for Kathy Duran letting her know Dr. Magnus Ivan does not need her to premedicate

## 2020-10-11 NOTE — Telephone Encounter (Signed)
Melanie from [facility] called requesting a call back concerning patient had hip replacement surgery and needed to knoe if pre meds are needed, Please call Melanie at [phone].

## 2020-10-11 NOTE — Telephone Encounter (Signed)
Kathy Duran from [facility] called requesting a call back concerning if pre meds are needed. Patient had hip replacement surgery from Dr. Cleophas Dunker and the other hip replacement from Dr. Magnus Ivan. Message also sent to [facility] about pre meds. Please call Kathy Duran at [phone].

## 2020-10-15 NOTE — Progress Notes (Signed)
Cardiology Office Note:    Date:  10/21/2020   ID:  Kathy Duran, DOB November 03, 1945, MRN [id number]  PCP:  Georgann Housekeeper, MD  Cardiologist:  Chrystie Nose, MD  Electrophysiologist:  None   Referring MD: Georgann Housekeeper, MD   Chief Complaint: follow-up of chest pain, dyspnea, and lower extremity edema.   History of Present Illness:    Kathy Duran is a 75 y.o. female with a history of with a history of chronic lower extremity edema, varicose veins, hypertension, dyslipidemia, asthma, GERD, Sjogren's syndrome, rheumatoid arthritis, fibromyalgia, and low back pain who is followed by Dr. Rennis Golden and presents today for follow-up of chest pain, dyspnea, and lower extremity edema.   Patient was initially seen by Dr. Rennis Golden in 11/2013 for further evaluation of lower extremity edema. Edema was ultimately felt to be due to her neuropathy. HCTZ was recommended to help with the edema and she was advised to follow-up as needed. She was seen again in 03/2015 due to concerns of continued lower extremity swelling. Echo was ordered and showed LVEF of 60-65% with mild LVH and grade 1 diastolic dysfunction. Again, lower extremity edema was not felt to be due to cardiac etiology. Compression stockings were recommended.  I recently saw the patient on 09/09/2020 for further evaluation of shortness of breath and medications concerns. PCP recently changed her Losartan 50mg  daily to Losartan-HCTZ 50-12.5mg  due to elevated BP and lower extremity edema. BP improved with this but she developed a severe headache which seemed to resolve when she went make to taking Losartan alone. She also reported worsening dyspnea as well as a chest discomfort that she described both as "indigestion" and "congestiion tightness." She also mentioned episodes of diaphoresis with minimal activity. BNP was normal. She was started on Lasix 20mg  daily to replace the HCTZ for her lower extremity edema. Chest pain sound atypical and Protonix was started  for possible GERD. However, Echo and Lexiscan were ordered for further evaluation. Echo showed LVEF of 60-65% with moderate septal hypertrophy and otherwise mild concentric LVH as well as grade 1 diastolic dysfunction. Lexiscan was low risk with no evidence of ischemia or infarction.  Patient presents today for follow-up. Patient doing better since last visit. BP has remained well controlled off HCTZ. The reassuring Echo and Lexiscan results have helped her feel less anxious and symptoms seem to have improved. She still has some shortness of breath at rest and with activity but states it is stable. Seems to be worse if she eats certain foods such as pork/bacon. She describes stable orthopnea and occasional PND. She has chronic lower extremity edema which is stable. Improves with elevation of legs. She notes this is also worse if she eats certain foods. She still has occasional chest discomfort but states this now only occurs rarely. Protonix has helped and she notices she will have some chest discomfort if she does not take it. She notes occasional palpitations but states these are much better now that some of her anxiety has been relieved. She has some mild lightheadedness/dizziness if she changes positions too quickly but this is not new. No falls or syncope.    Past Medical History:  Diagnosis Date  . Anxiety   . Arthritis    left hip  . Asthma    Inhalers for tx  . Bilateral primary osteoarthritis of knee 07/06/2016   Moderate  . Bilateral swelling of feet    bilateral ankle and feet edema  . Bruises easily   .  Complication of anesthesia    sometimes needs oxygen when waking uo due to asthma  . DDD (degenerative disc disease), lumbar 07/06/2016  . Fibromyalgia   . GERD (gastroesophageal reflux disease)   . Hypertension   . Myofacial muscle pain 07/06/2016  . Osteoarthritis of both hands 07/06/2016  . Seasonal allergies 07/06/2016  . Sinus complaint   . Sjogren's syndrome (HCC)    dry  eyes  . Tenonitis   . Urinary incontinence    occ. an issue wears pads    Past Surgical History:  Procedure Laterality Date  . ABDOMINAL HYSTERECTOMY  1986  . BACK SURGERY  1985  . BREAST CYST ASPIRATION    . BREAST SURGERY Right    cyst drainage  . CARPAL TUNNEL RELEASE Right 2001  . KNEE SURGERY  1991  . SHOULDER SURGERY Right    right shoulder tendon and rotator cuff repair  . TOTAL HIP ARTHROPLASTY Right 2006  . TOTAL HIP ARTHROPLASTY Left 02/24/2016   Procedure: LEFT TOTAL HIP ARTHROPLASTY ANTERIOR APPROACH;  Surgeon: Kathryne Hitch, MD;  Location: [facility];  Service: Orthopedics;  Laterality: Left;  . TOTAL KNEE ARTHROPLASTY Left 11/16/2019   Procedure: TOTAL KNEE ARTHROPLASTY;  Surgeon: Ollen Gross, MD;  Location: [facility];  Service: Orthopedics;  Laterality: Left;     Current Medications: Current Meds  Medication Sig  . albuterol (PROVENTIL) (2.5 MG/3ML) 0.083% nebulizer solution Take 2.5 mg by nebulization as needed for wheezing.  Marland Kitchen albuterol (VENTOLIN HFA) 108 (90 Base) MCG/ACT inhaler Inhale 1 puff into the lungs every 4 (four) hours as needed for wheezing or shortness of breath.  Marland Kitchen atorvastatin (LIPITOR) 40 MG tablet TAKE 1 TABLET BY MOUTH EVERY DAY (Patient taking differently: Take 40 mg by mouth every evening.)  . cetirizine (ZYRTEC) 10 MG tablet Take 10 mg by mouth daily.  . Cholecalciferol (VITAMIN D3) 50 MCG (2000 UT) TABS Take 2,000-4,000 Units by mouth daily.  . cyclobenzaprine (FLEXERIL) 10 MG tablet TAKE 1 TABLET (10 MG TOTAL) BY MOUTH 2 (TWO) TIMES DAILY AS NEEDED (SPASMS.).  Marland Kitchen estradiol (VIVELLE-DOT) 0.1 MG/24HR patch Place 1 patch onto the skin 2 (two) times a week. Sundays & Wednesdays.  . Fluticasone-Salmeterol (ADVAIR) 250-50 MCG/DOSE AEPB Inhale 1 puff into the lungs 2 (two) times daily.  . furosemide (LASIX) 20 MG tablet Take 1 tablet (20 mg total) by mouth daily.  . methocarbamol (ROBAXIN) 500 MG tablet Take 1 tablet (500 mg total) by mouth  every 6 (six) hours as needed for muscle spasms.  . Multiple Vitamin (MULTIVITAMIN WITH MINERALS) TABS tablet Take 1 tablet by mouth daily.  . pantoprazole (PROTONIX) 40 MG tablet Take 1 tablet (40 mg total) by mouth daily.  . RESTASIS 0.05 % ophthalmic emulsion Place 1 drop into both eyes 2 (two) times daily.  Anson Fret 10 MCG TABS vaginal tablet Place 10 mcg vaginally 2 (two) times a week. Tuesdays & Thursdays.     Allergies:   Clavulanic acid, Losartan potassium-hctz, Amoxicillin-pot clavulanate, Diclofenac-misoprostol, Latex, and Naproxen   Social History   Socioeconomic History  . Marital status: Married    Spouse name: Not on file  . Number of children: 1  . Years of education: Not on file  . Highest education level: Not on file  Occupational History  . Not on file  Tobacco Use  . Smoking status: Former Smoker    Quit date: 11/21/1983    Years since quitting: 36.9  . Smokeless tobacco: Never Used  Vaping  Use  . Vaping Use: Never used  Substance and Sexual Activity  . Alcohol use: No  . Drug use: No  . Sexual activity: Not Currently  Other Topics Concern  . Not on file  Social History Narrative  . Not on file   Social Determinants of Health   Financial Resource Strain: Not on file  Food Insecurity: Not on file  Transportation Needs: Not on file  Physical Activity: Not on file  Stress: Not on file  Social Connections: Not on file     Family History: The patient's family history includes Allergies in her daughter; Arrhythmia in her mother; Cancer in her father; Dementia in her sister; Diabetes in her father, mother, and sister; Healthy in her daughter; Heart attack in her father and paternal grandfather; Hyperlipidemia in her maternal grandmother and mother; Hypertension in her father, maternal grandmother, and mother; Lung disease in her brother; Stroke in her mother and paternal grandfather.  ROS:   Please see the history of present illness.     EKGs/Labs/Other  Studies Reviewed:    The following studies were reviewed today:  Lexiscan Myoview 09/27/2020:  The left ventricular ejection fraction is normal (55-65%).  Nuclear stress EF: 65%.  There was no ST segment deviation noted during stress.  The study is normal.  This is a low risk study.   Normal stress nuclear study with no ischemia or infarction.  Gated ejection fraction 65% with normal wall motion. _______________  Echocardiogram 10/04/2020: 1. Moderate septal hypertrophy with otherwise mild concentric LVH. Left  ventricular ejection fraction, by estimation, is 60 to 65%. The left  ventricle has normal function. The left ventricle has no regional wall  motion abnormalities. There is moderate  asymmetric left ventricular hypertrophy. Left ventricular diastolic  parameters are consistent with Grade I diastolic dysfunction (impaired  relaxation).  2. Right ventricular systolic function is normal. The right ventricular  size is normal. There is mildly elevated pulmonary artery systolic  pressure.  3. The mitral valve is normal in structure. No evidence of mitral valve  regurgitation. No evidence of mitral stenosis.  4. The aortic valve is tricuspid. Aortic valve regurgitation is mild. No  aortic stenosis is present. Aortic regurgitation PHT measures 344 msec.  5. The inferior vena cava is dilated in size with <50% respiratory  variability, suggesting right atrial pressure of 15 mmHg.   EKG:  EKG not ordered today.   Recent Labs: 11/06/2019: ALT 32 11/18/2019: Hemoglobin 11.4; Platelets 303 09/09/2020: NT-Pro BNP 29 09/16/2020: BUN 15; Creatinine, Ser 0.77; Potassium 3.9; Sodium 141  Recent Lipid Panel    Component Value Date/Time   CHOL 245 (H) 04/25/2015 0955   CHOL 212 (H) 12/01/2013 1142   TRIG 47 04/25/2015 0955   TRIG 83 12/01/2013 1142   HDL 107 04/25/2015 0955   HDL 81 12/01/2013 1142   CHOLHDL 2.3 04/25/2015 0955   LDLCALC 129 04/25/2015 0955   LDLCALC 114 (H)  12/01/2013 1142    Physical Exam:    Vital Signs: BP 134/86   Pulse 80   Ht 5' 5.5" (1.664 m)   Wt 221 lb (100.2 kg)   SpO2 96%   BMI 36.22 kg/m     Wt Readings from Last 3 Encounters:  10/21/20 221 lb (100.2 kg)  09/27/20 217 lb (98.4 kg)  09/09/20 217 lb (98.4 kg)     General: 75 y.o. obese African-American female in no acute distress. HEENT: Normocephalic and atraumatic. Sclera clear.  Neck: Supple. No JVD. Heart:  RRR. Distinct S1 and S2. No murmurs, gallops, or rubs. Radial pulses 2+ and equal bilaterally. Lungs: No increased work of breathing. Clear to ausculation bilaterally. No wheezes, rhonchi, or rales.  Abdomen: Soft, non-distended, and non-tender to palpation. MSK: Normal strength and tone for age.  Extremities: Mild lower extremity edema bilaterally. Wearing compression stockings. Skin: Warm and dry. Neuro: Alert and oriented x3. No focal deficits. Psych: Normal affect. Responds appropriately.   Assessment:    1. Dyspnea, unspecified type   2. Lower extremity edema   3. Mild diastolic dysfunction   4. Chest pain of uncertain etiology   5. Primary hypertension   6. Dyslipidemia     Plan:    Dyspnea Lower Extremity Edema Mild Diastolic Dysfunction - Continues to have some dyspnea but stable. - BNP was normal at last visit. - Echo showed LVEF of 60-65% with moderate septal hypertrophy and otherwise mild concentric LVH as well as grade 1 diastolic dysfunction. - Myoview was low risk with no evidence of ischemia or infarction.  - May have a component of mild diastolic CHF but patient long history of lower extremity edema was not felt to be due to cardiac etiology in the past. Continue Lasix  daily. Advised patient that she can take an extra  as needed for weight gain or worsening lower extremity edema. Continue compression stockings. Continue sodium restrictions.   Chest Pain - Chest pain improved and now only occurs rarely (such as when she  does  not take Protonix). - Recent Myoview on 10/04/2020 was low risk with no evidence of ischemia or infarction. - Suspect there is an anxiety component as patient states symptoms have improved after reassuring stress test and Echo results.  - No additional work-up needed at this time.  Hypertension - BP well controlled. - Continue Losartan  daily.  Dyslipidemia - Continue Lipitor  daily. - Followed by PCP.  Disposition: Follow up in 4-6 months with Dr. Rennis Golden    Medication Adjustments/Labs and Tests Ordered: Current medicines are reviewed at length with the patient today.  Concerns regarding medicines are outlined above.  No orders of the defined types were placed in this encounter.  No orders of the defined types were placed in this encounter.   Patient Instructions  Medication Instructions:   No Changes (Can Take an Additional Lasix 20 mg for Swelling and Weight Gain) *If you need a refill on your cardiac medications before your next appointment, please call your pharmacy*   Lab Work: No labs If you have labs (blood work) drawn today and your tests are completely normal, you will receive your results only by: Marland Kitchen MyChart Message (if you have MyChart) OR . A paper copy in the mail If you have any lab test that is abnormal or we need to change your treatment, we will call you to review the results.   Testing/Procedures: No Testing    Follow-Up: At [facility], you and your health needs are our priority.  As part of our continuing mission to provide you with exceptional heart care, we have created designated Provider Care Teams.  These Care Teams include your primary Cardiologist (physician) and Advanced Practice Providers (APPs -  Physician Assistants and Nurse Practitioners) who all work together to provide you with the care you need, when you need it.  Your next appointment:   4-6  month(s)  The format for your next appointment:   In Person  Provider:   K. [place]  Hilty, MD   Other Instructions  Heart Failure Education:  Weigh yourself EVERY morning after you go to the bathroom but before you eat or drink anything. Write this number down in a weight log/diary. If you gain 3 pounds overnight or 5 pounds in a week, call the office. Take your medicines as prescribed. If you have concerns about your medications, please call us before you stop taking them. Eat low salt foods--Limit salt (sodium) to 2000 mg per day. This will help prevent your body from holding onto fluid. Read food labels as many processed foods have a lot of sodium, especially canned goods and prepackaged meats. If you would like some assistance choosing low sodium foods, we would be happy to set you up with a nutritionist. Limit all fluids for the day to less than 2 liters (64 ounces). Fluid includes all drinks, coffee, juice, ice chips, soup, jello, and all other liquids. Stay as active as you can everyday. Staying active will give you more energy and make your muscles stronger. Start with 5 minutes at a time and work your way up to 30 minutes a day. Break up your activities--do some in the morning and some in the afternoon. Start with 3 days per week and work your way up to 5 days as you can.  If you have chest pain, feel short of breath, dizzy, or lightheaded, STOP. If you don't feel better after a short rest, call 911. If you do feel better, call the office to let us know you have symptoms with exercise.    Signed, Corrin Parker, PA-C  10/21/2020 1:20 PM    [facility]

## 2020-10-21 ENCOUNTER — Ambulatory Visit (INDEPENDENT_AMBULATORY_CARE_PROVIDER_SITE_OTHER): Payer: Medicare PPO | Admitting: Student

## 2020-10-21 ENCOUNTER — Encounter: Payer: Self-pay | Admitting: Student

## 2020-10-21 ENCOUNTER — Other Ambulatory Visit: Payer: Self-pay

## 2020-10-21 VITALS — BP 134/86 | HR 80 | Ht 65.5 in | Wt 221.0 lb

## 2020-10-21 DIAGNOSIS — R600 Localized edema: Secondary | ICD-10-CM | POA: Diagnosis not present

## 2020-10-21 DIAGNOSIS — I10 Essential (primary) hypertension: Secondary | ICD-10-CM

## 2020-10-21 DIAGNOSIS — E785 Hyperlipidemia, unspecified: Secondary | ICD-10-CM

## 2020-10-21 DIAGNOSIS — R0600 Dyspnea, unspecified: Secondary | ICD-10-CM | POA: Diagnosis not present

## 2020-10-21 DIAGNOSIS — R079 Chest pain, unspecified: Secondary | ICD-10-CM | POA: Diagnosis not present

## 2020-10-21 DIAGNOSIS — I519 Heart disease, unspecified: Secondary | ICD-10-CM

## 2020-10-21 NOTE — Patient Instructions (Addendum)
Medication Instructions:   No Changes (Can Take an Additional Lasix 20 mg for Swelling and Weight Gain) *If you need a refill on your cardiac medications before your next appointment, please call your pharmacy*   Lab Work: No labs If you have labs (blood work) drawn today and your tests are completely normal, you will receive your results only by: Marland Kitchen MyChart Message (if you have MyChart) OR . A paper copy in the mail If you have any lab test that is abnormal or we need to change your treatment, we will call you to review the results.   Testing/Procedures: No Testing    Follow-Up: At [facility], you and your health needs are our priority.  As part of our continuing mission to provide you with exceptional heart care, we have created designated Provider Care Teams.  These Care Teams include your primary Cardiologist (physician) and Advanced Practice Providers (APPs -  Physician Assistants and Nurse Practitioners) who all work together to provide you with the care you need, when you need it.  Your next appointment:   4-6  month(s)  The format for your next appointment:   In Person  Provider:   K. [place] Hilty, MD   Other Instructions     Heart Failure Education:  Weigh yourself EVERY morning after you go to the bathroom but before you eat or drink anything. Write this number down in a weight log/diary. If you gain 3 pounds overnight or 5 pounds in a week, call the office. Take your medicines as prescribed. If you have concerns about your medications, please call us before you stop taking them. Eat low salt foods--Limit salt (sodium) to 2000 mg per day. This will help prevent your body from holding onto fluid. Read food labels as many processed foods have a lot of sodium, especially canned goods and prepackaged meats. If you would like some assistance choosing low sodium foods, we would be happy to set you up with a nutritionist. Limit all fluids for the day to less than 2 liters  (64 ounces). Fluid includes all drinks, coffee, juice, ice chips, soup, jello, and all other liquids. Stay as active as you can everyday. Staying active will give you more energy and make your muscles stronger. Start with 5 minutes at a time and work your way up to 30 minutes a day. Break up your activities--do some in the morning and some in the afternoon. Start with 3 days per week and work your way up to 5 days as you can.  If you have chest pain, feel short of breath, dizzy, or lightheaded, STOP. If you don't feel better after a short rest, call 911. If you do feel better, call the office to let us know you have symptoms with exercise.

## 2020-11-15 DIAGNOSIS — R059 Cough, unspecified: Secondary | ICD-10-CM | POA: Diagnosis not present

## 2020-11-15 DIAGNOSIS — M1990 Unspecified osteoarthritis, unspecified site: Secondary | ICD-10-CM | POA: Diagnosis not present

## 2020-12-01 ENCOUNTER — Encounter: Payer: Medicare PPO | Admitting: Sports Medicine

## 2020-12-13 ENCOUNTER — Encounter: Payer: Medicare PPO | Admitting: Sports Medicine

## 2020-12-15 ENCOUNTER — Other Ambulatory Visit: Payer: Self-pay

## 2020-12-15 ENCOUNTER — Ambulatory Visit (INDEPENDENT_AMBULATORY_CARE_PROVIDER_SITE_OTHER): Payer: Medicare PPO | Admitting: Sports Medicine

## 2020-12-15 DIAGNOSIS — R269 Unspecified abnormalities of gait and mobility: Secondary | ICD-10-CM

## 2020-12-15 NOTE — Progress Notes (Signed)
CC: Chronic ankle swelling and foot pain  Patient in custom orthotics since 2018  These have helped her walk up to 5000 steps day Wants a new pair as older ones are worn  Foot eval Loss of long arch bilaterally Pronated at mid foot and forefoot Calcaneal valgus  Patient was fitted for a : standard, cushioned, semi-rigid orthotic. The orthotic was heated and afterward the patient stood on the orthotic blank positioned on the orthotic stand. The patient was positioned in subtalar neutral position and 10 degrees of ankle dorsiflexion in a weight bearing stance. After completion of molding, a stable base was applied to the orthotic blank. The blank was ground to a stable position for weight bearing. Size:9 Blue swirl med EVA Base: Blue medium EVA Posting: Heel wedge Left first ray post Additional orthotic padding:  Good comfort and neutral gait on completion

## 2020-12-15 NOTE — Assessment & Plan Note (Signed)
New orthotics prepared today

## 2021-02-13 ENCOUNTER — Telehealth: Payer: Self-pay | Admitting: Student

## 2021-02-13 MED ORDER — BUMETANIDE 0.5 MG PO TABS
0.5000 mg | ORAL_TABLET | Freq: Every day | ORAL | 3 refills | Status: DC
Start: 2021-02-13 — End: 2021-06-06

## 2021-02-13 NOTE — Telephone Encounter (Signed)
Spoke with patient.  She states that this started several months ago, but has been worse in the past few weeks.  Would like to determine if caused by the furosemide.  Explained that it could be more related to hot weather and her sweating more frequently.   Will have her d/c furosemide for now and try bumetanide 0.5 mg once daily for a month.  Explained that with skin issues, she could still have itching for another week as the skin heals.    Rx to [facility] [address]

## 2021-02-13 NOTE — Telephone Encounter (Signed)
Pt c/o medication issue:  1. Name of Medication: furosemide (LASIX) 20 MG tablet (Expired)  2. How are you currently taking this medication (dosage and times per day)? As Noted  3. Are you having a reaction (difficulty breathing--STAT)? Yes   4. What is your medication issue? furosemide (LASIX) 20 MG tablet  has an major allergic reaction to medication. Scalp is very irritated and feels like it's on fire all the time. Has taken oil and moisture treatments but that has not helped.

## 2021-03-15 DIAGNOSIS — Z8739 Personal history of other diseases of the musculoskeletal system and connective tissue: Secondary | ICD-10-CM | POA: Diagnosis not present

## 2021-03-15 DIAGNOSIS — M1990 Unspecified osteoarthritis, unspecified site: Secondary | ICD-10-CM | POA: Diagnosis not present

## 2021-03-22 DIAGNOSIS — M19011 Primary osteoarthritis, right shoulder: Secondary | ICD-10-CM | POA: Diagnosis not present

## 2021-03-22 DIAGNOSIS — M25511 Pain in right shoulder: Secondary | ICD-10-CM | POA: Diagnosis not present

## 2021-04-04 ENCOUNTER — Encounter: Payer: Self-pay | Admitting: Internal Medicine

## 2021-04-04 ENCOUNTER — Ambulatory Visit (INDEPENDENT_AMBULATORY_CARE_PROVIDER_SITE_OTHER): Payer: Medicare PPO | Admitting: Internal Medicine

## 2021-04-04 ENCOUNTER — Other Ambulatory Visit: Payer: Self-pay

## 2021-04-04 VITALS — BP 128/72 | HR 97 | Ht 65.0 in | Wt 217.2 lb

## 2021-04-04 DIAGNOSIS — R0600 Dyspnea, unspecified: Secondary | ICD-10-CM

## 2021-04-04 DIAGNOSIS — R252 Cramp and spasm: Secondary | ICD-10-CM

## 2021-04-04 DIAGNOSIS — Z79899 Other long term (current) drug therapy: Secondary | ICD-10-CM

## 2021-04-04 DIAGNOSIS — E785 Hyperlipidemia, unspecified: Secondary | ICD-10-CM

## 2021-04-04 NOTE — Patient Instructions (Signed)
Medication Instructions:  Your physician recommends that you continue on your current medications as directed. Please refer to the Current Medication list given to you today.  *If you need a refill on your cardiac medications before your next appointment, please call your pharmacy*   Lab Work: BMET, BNP, Magnesium today   If you have labs (blood work) drawn today and your tests are completely normal, you will receive your results only by: MyChart Message (if you have MyChart) OR A paper copy in the mail If you have any lab test that is abnormal or we need to change your treatment, we will call you to review the results.   Testing/Procedures: Dr. Rennis Golden has ordered a CT coronary calcium score. This test is done at [address] [address]. This is $99 out of pocket.   Coronary CalciumScan A coronary calcium scan is an imaging test used to look for deposits of calcium and other fatty materials (plaques) in the inner lining of the blood vessels of the heart (coronary arteries). These deposits of calcium and plaques can partly clog and narrow the coronary arteries without producing any symptoms or warning signs. This puts a person at risk for a heart attack. This test can detect these deposits before symptoms develop. Tell a health care provider about: Any allergies you have. All medicines you are taking, including vitamins, herbs, eye drops, creams, and over-the-counter medicines. Any problems you or family members have had with anesthetic medicines. Any blood disorders you have. Any surgeries you have had. Any medical conditions you have. Whether you are pregnant or may be pregnant. What are the risks? Generally, this is a safe procedure. However, problems may occur, including: Harm to a pregnant woman and her unborn baby. This test involves the use of radiation. Radiation exposure can be dangerous to a pregnant woman and her unborn baby. If you are pregnant, you generally should  not have this procedure done. Slight increase in the risk of cancer. This is because of the radiation involved in the test. What happens before the procedure? No preparation is needed for this procedure. What happens during the procedure? You will undress and remove any jewelry around your neck or chest. You will put on a hospital gown. Sticky electrodes will be placed on your chest. The electrodes will be connected to an electrocardiogram (ECG) machine to record a tracing of the electrical activity of your heart. A CT scanner will take pictures of your heart. During this time, you will be asked to lie still and hold your breath for 2-3 seconds while a picture of your heart is being taken. The procedure may vary among health care providers and hospitals. What happens after the procedure? You can get dressed. You can return to your normal activities. It is up to you to get the results of your test. Ask your health care provider, or the department that is doing the test, when your results will be ready. Summary A coronary calcium scan is an imaging test used to look for deposits of calcium and other fatty materials (plaques) in the inner lining of the blood vessels of the heart (coronary arteries). Generally, this is a safe procedure. Tell your health care provider if you are pregnant or may be pregnant. No preparation is needed for this procedure. A CT scanner will take pictures of your heart. You can return to your normal activities after the scan is done. This information is not intended to replace advice given to you by  your health care provider. Make sure you discuss any questions you have with your health care provider. Document Released: 01/05/2008 Document Revised: 05/28/2016 Document Reviewed: 05/28/2016 Elsevier Interactive Patient Education  2017 [employer].    Follow-Up: At [facility], you and your health needs are our priority.  As part of our continuing mission to  provide you with exceptional heart care, we have created designated Provider Care Teams.  These Care Teams include your primary Cardiologist (physician) and Advanced Practice Providers (APPs -  Physician Assistants and Nurse Practitioners) who all work together to provide you with the care you need, when you need it.  We recommend signing up for the patient portal called "MyChart".  Sign up information is provided on this After Visit Summary.  MyChart is used to connect with patients for Virtual Visits (Telemedicine).  Patients are able to view lab/test results, encounter notes, upcoming appointments, etc.  Non-urgent messages can be sent to your provider as well.   To learn more about what you can do with MyChart, go to [email or web address].    Your next appointment:   6 month(s)  The format for your next appointment:   In Person  Provider:   You may see Chrystie Nose, MD or one of the following Advanced Practice Providers on your designated Care Team:   Azalee Course, PA-C Micah Flesher, PA-C or  Judy Pimple, [place]   Other Instructions

## 2021-04-04 NOTE — Progress Notes (Signed)
OFFICE NOTE  Chief Complaint:  Leg cramps  Primary Care Physician: Georgann Housekeeper, MD  HPI:  Kathy Duran is a pleasant 75 year old female with a history of premature arthritis, hypertension, dyslipidemia, gout bladder spasm, GERD and low back pain. She's also had right hip surgery.  She is a self-referral for evaluation of lower stringy swelling. She previously seen Dr. Newt Lukes in 2007 in May that a stress test at that time. She's managed to use some compression stockings for her swelling which worked. She reports the swelling is gotten worse during the day which is on her feet but improves after she elevates it at night. She's had a limited workup of her swelling before occluding venous Dopplers which showed no evidence of a DVT in 2011. She denies any varicose veins. She has been having some problems with sores on her feet. She is seeing Dr. Leary Roca [facility]. She denies any shortness of breath or chest pain. She has no orthopnea or PND.  Mrs. Kathy Duran returns today for followup of her venous Dopplers. This did not show any evidence for venous insufficiency. There was no evidence for deep vein thrombosis. She continues to have some swelling mostly in her left foot. She is now back in a walking boot. Her renal function is normal and BNP was low indicating this is not likely heart failure. 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 In fact in her right hand it is so significant particularly toward the wrist that she is undergoing carpal tunnel surgery later this week.  Mrs. Kathy Duran he was seen back in the office today in follow-up. Overall she seems to be doing fairly well although continues to have concerns about leg swelling. At times she reports some swelling in her legs including today. She's had venous Dopplers last year which failed to show any venous insufficiency. She's concerned that there may  be a cardiac etiology of this. She denies any significant shortness of breath, orthopnea or PND.  I had the pleasure seeing Mrs. Kathy Duran back in the office today for follow-up. She underwent an echocardiogram which showed normal systolic function mild diastolic dysfunction, but no clear etiology for her lower extremity swelling. She says she's been using compression stockings with a significant improvement in her symptoms.  12/06/2016  Kathy Duran returns today for follow-up. I last saw her about 2 years ago. She recently had blood work from Dr. Eula Listen with the goal and will obtain those labs. Over the past several years she's done fairly well denies any worsening shortness of breath, chest pain or associated symptoms. She still struggles with some swelling of her legs which I suspect his lymphedema. We have done previously ultrasounds of her legs without any clear evidence for venous insufficiency. She does have some improvement with compression stockings but finds them intolerable to wear when it's hot out. Blood pressure is at goal today.  10/26/2019  Kathy Duran is seen today for preoperative clearance.  I last saw her back a few years ago.  She overall has been doing fairly well but struggling with knee pain.  She is desiring left total knee replacement.  She denies any new chest pain or worsening shortness of breath.  She did have labs in 2020 which showed an LDL cholesterol 126.  She is on atorvastatin 40 mg.  Her HDL is actually 83.  No known coronary disease.  She does have arthritis  which I thought was rheumatoid but might also be osteoarthritis.  She is followed by Dr. Erlene Senters.  EKG is normal sinus rhythm.  Is on no blood thinners.  04/04/2021  Mrs. Kathy Duran returns today for follow-up.  She recently saw Marjie Skiff, PA-C in April 2022.  At the time she was having some indigestion or chest discomfort.  She underwent a stress test and echocardiogram.  Her Myoview stress test was negative for ischemia.  Echo  showed mild concentric LVH, normal LVEF 60 to 65% and grade 1 diastolic dysfunction.  She was placed on Lasix but ultimately switched to Bumex.  That dose was then decreased from milligrams to a half a milligram and seems to be well-tolerated.  She noted some swelling which has improved.  She is now however having cramping in her legs.  She has a history of lipedema and appears to have no worsening edema today compared to her baseline from my recollection.  She also had lipids in January showing total cholesterol 216, HDL 80, LDL 120 and triglycerides 93.  She is on a statin based on these numbers her calculated LDL cholesterol may be much higher perhaps even well over 200 without therapy.  This could suggest a familial hyperlipidemia, there is actually in fact a family history of high cholesterol in both parents and cardiovascular disease.  PMHx:  Past Medical History:  Diagnosis Date   Anxiety    Arthritis    left hip   Asthma    Inhalers for tx   Bilateral primary osteoarthritis of knee 07/06/2016   Moderate   Bilateral swelling of feet    bilateral ankle and feet edema   Bruises easily    Complication of anesthesia    sometimes needs oxygen when waking uo due to asthma   DDD (degenerative disc disease), lumbar 07/06/2016   Fibromyalgia    GERD (gastroesophageal reflux disease)    Hypertension    Myofacial muscle pain 07/06/2016   Osteoarthritis of both hands 07/06/2016   Seasonal allergies 07/06/2016   Sinus complaint    Sjogren's syndrome (HCC)    dry eyes   Tenonitis    Urinary incontinence    occ. an issue wears pads    Past Surgical History:  Procedure Laterality Date   ABDOMINAL HYSTERECTOMY  1986   BACK SURGERY  1985   BREAST CYST ASPIRATION     BREAST SURGERY Right    cyst drainage   CARPAL TUNNEL RELEASE Right 2001   KNEE SURGERY  1991   SHOULDER SURGERY Right    right shoulder tendon and rotator cuff repair   TOTAL HIP ARTHROPLASTY Right 2006   TOTAL HIP  ARTHROPLASTY Left 02/24/2016   Procedure: LEFT TOTAL HIP ARTHROPLASTY ANTERIOR APPROACH;  Surgeon: Kathryne Hitch, MD;  Location: [facility];  Service: Orthopedics;  Laterality: Left;   TOTAL KNEE ARTHROPLASTY Left 11/16/2019   Procedure: TOTAL KNEE ARTHROPLASTY;  Surgeon: Ollen Gross, MD;  Location: [facility];  Service: Orthopedics;  Laterality: Left;     FAMHx:  Family History  Problem Relation Age of Onset   Arrhythmia Mother    Stroke Mother    Hyperlipidemia Mother    Diabetes Mother    Hypertension Mother    Heart attack Father    Diabetes Father    Cancer Father    Hypertension Father    Hyperlipidemia Maternal Grandmother    Hypertension Maternal Grandmother    Heart attack Paternal Grandfather    Stroke Paternal Grandfather  Lung disease Brother    Diabetes Sister    Dementia Sister    Allergies Daughter    Healthy Daughter     SOCHx:   reports that she quit smoking about 37 years ago. Her smoking use included cigarettes. She has never used smokeless tobacco. She reports that she does not drink alcohol and does not use drugs.  ALLERGIES:  Allergies  Allergen Reactions   Clavulanic Acid    Losartan Potassium-Hctz Other (See Comments)    HEADACHES   Amoxicillin-Pot Clavulanate Rash   Diclofenac-Misoprostol Itching    Arthrotec   Latex Itching   Naproxen Nausea Only    ROS: Pertinent items noted in HPI and remainder of comprehensive ROS otherwise negative.  HOME MEDS: Current Outpatient Medications  Medication Sig Dispense Refill   albuterol (PROVENTIL) (2.5 MG/3ML) 0.083% nebulizer solution Take 2.5 mg by nebulization as needed for wheezing.     albuterol (VENTOLIN HFA) 108 (90 Base) MCG/ACT inhaler Inhale 1 puff into the lungs every 4 (four) hours as needed for wheezing or shortness of breath.     atorvastatin (LIPITOR) 40 MG tablet TAKE 1 TABLET BY MOUTH EVERY DAY (Patient taking differently: Take 40 mg by mouth every evening.) 90 tablet 3    bumetanide (BUMEX) 0.5 MG tablet Take 1 tablet (0.5 mg total) by mouth daily. 30 tablet 3   cetirizine (ZYRTEC) 10 MG tablet Take 10 mg by mouth daily.     cyclobenzaprine (FLEXERIL) 10 MG tablet TAKE 1 TABLET (10 MG TOTAL) BY MOUTH 2 (TWO) TIMES DAILY AS NEEDED (SPASMS.). 40 tablet 1   estradiol (VIVELLE-DOT) 0.1 MG/24HR patch Place 1 patch onto the skin 2 (two) times a week. Sundays & Wednesdays.     Fluticasone-Salmeterol (ADVAIR) 250-50 MCG/DOSE AEPB Inhale 1 puff into the lungs 2 (two) times daily.     methocarbamol (ROBAXIN) 500 MG tablet Take 1 tablet (500 mg total) by mouth every 6 (six) hours as needed for muscle spasms. 40 tablet 0   Multiple Vitamin (MULTIVITAMIN WITH MINERALS) TABS tablet Take 1 tablet by mouth daily.     pantoprazole (PROTONIX) 40 MG tablet Take 1 tablet (40 mg total) by mouth daily. 30 tablet 2   RESTASIS 0.05 % ophthalmic emulsion Place 1 drop into both eyes 2 (two) times daily.     YUVAFEM 10 MCG TABS vaginal tablet Place 10 mcg vaginally 2 (two) times a week. Tuesdays & Thursdays.     Cholecalciferol (VITAMIN D3) 50 MCG (2000 UT) TABS Take 2,000-4,000 Units by mouth daily. (Patient not taking: Reported on 04/04/2021)     No current facility-administered medications for this visit.    LABS/IMAGING: No results found for this or any previous visit (from the past 48 hour(s)). No results found.  VITALS: BP 128/72   Pulse 97   Ht 5\' 5"  (1.651 m)   Wt 217 lb 3.2 oz (98.5 kg)   SpO2 95%   BMI 36.14 kg/m   EXAM: General appearance: alert and no distress Neck: no carotid bruit and no JVD Lungs: clear to auscultation bilaterally Heart: regular rate and rhythm, S1, S2 normal, no murmur, click, rub or gallop Abdomen: soft, non-tender; bowel sounds normal; no masses,  no organomegaly Extremities: edema Trace bilateral Pulses: 2+ and symmetric Skin: Skin color, texture, turgor normal. No rashes or lesions Neurologic: Grossly normal Psych:  Pleasant  EKG: Normal sinus rhythm at 97, possible left atrial enlargement-personally reviewed  ASSESSMENT: Leg cramps Recent shortness of breath/chest pain-negative Myoview stress test (09/2020)  and echo with LVEF 60 to 65%, grade 1 diastolic dysfunction and mild LVH. Mild AI is noted.  RVSP was 37 mmHg with RA pressure of 15 mmHg -diuretics were started after this. History of lipidema Hypertension Dyslipidemia Rheumatoid arthritis  PLAN: 1.   Ms. Waage has recently been having some leg cramping.  She had some shortness of breath which is improved on diuretics but may be over diuresed.  We will go ahead and check labs today including a metabolic profile, magnesium and BNP.  With regards to further risk assessment, her cholesterol is quite high despite the fact that she is on high potency statin.  Given her family history of high cholesterol, I would like to look further with a calcium score.  Although she had negative recent stress testing, I am interested as to whether her higher HDL could be cardioprotective or not.  We will go ahead and contact her with those results and may need to augment her therapy if her calcium score is elevated.  Follow-up in 6 months or sooner as necessary.  Chrystie Nose, MD, [facility], FACP  [facility]  Medical Director of the [facility]  [facility] Diplomate of the American Board of Clinical Lipidology Attending Cardiologist  Direct Dial: [phone]  Fax: [phone]  Website:  [email or web address] Nicely Jensyn Cambria 04/04/2021, 10:53 AM

## 2021-04-05 LAB — BASIC METABOLIC PANEL
BUN/Creatinine Ratio: 19 (ref 12–28)
BUN: 13 mg/dL (ref 8–27)
CO2: 23 mmol/L (ref 20–29)
Calcium: 9.7 mg/dL (ref 8.7–10.3)
Chloride: 103 mmol/L (ref 96–106)
Creatinine, Ser: 0.68 mg/dL (ref 0.57–1.00)
Glucose: 91 mg/dL (ref 65–99)
Potassium: 4.4 mmol/L (ref 3.5–5.2)
Sodium: 140 mmol/L (ref 134–144)
eGFR: 92 mL/min/{1.73_m2} (ref >59)

## 2021-04-05 LAB — MAGNESIUM: Magnesium: 2.2 mg/dL (ref 1.6–2.3)

## 2021-04-05 LAB — BRAIN NATRIURETIC PEPTIDE: BNP: 19.8 pg/mL (ref 0.0–100.0)

## 2021-04-06 NOTE — Telephone Encounter (Signed)
Ok thanks.  Dr H 

## 2021-04-07 NOTE — Progress Notes (Signed)
Office Visit Note  Patient: Kathy Duran             Date of Birth: 01/21/1946           MRN: [id number]             PCP: Georgann Housekeeper, MD Referring: Georgann Housekeeper, MD Visit Date: 04/21/2021 Occupation: @GUAROCC @  Subjective:  Pain in multiple joints.   History of Present Illness: Kathy Duran is a 75 y.o. female with a history of sicca syndrome, osteoarthritis and fibromyalgia.  She continues to have dry mouth and dry eyes symptoms.  She has been using Restasis and over-the-counter eyedrops.  She had left total knee replacement in April 2021 by Dr. May 2021.  She still continues to have some discomfort in her left knee.  The right knee joint is not as painful.  Her bilateral total hip replacements are doing well.  She continues to have some discomfort in her neck and lower back.  She also has osteoarthritis in her hands which causes discomfort.  She continues to have some generalized pain from fibromyalgia.  She states she has been under a lot of stress recently.  Her mother had to go into the nursing home and she had to sell her mother's home.  She states she developed pain and swelling in her hands about a month ago.  She was given a prednisone taper for 7 days by her PCP which helped.  She states the swelling is coming back.  Activities of Daily Living:  Patient reports morning stiffness for 24 hours.   Patient Reports nocturnal pain.  Difficulty dressing/grooming: Reports Difficulty climbing stairs: Reports Difficulty getting out of chair: Reports Difficulty using hands for taps, buttons, cutlery, and/or writing: Reports  Review of Systems  Constitutional:  Positive for fatigue.  HENT:  Positive for mouth dryness.   Eyes:  Positive for dryness.  Respiratory:  Positive for shortness of breath.   Cardiovascular:  Positive for swelling in legs/feet.  Gastrointestinal:  Positive for constipation.  Endocrine: Positive for cold intolerance, heat intolerance, excessive thirst and  increased urination.  Genitourinary:  Negative for difficulty urinating.  Musculoskeletal:  Positive for joint pain, gait problem, joint pain, joint swelling, muscle weakness, morning stiffness and muscle tenderness.  Skin:  Negative for rash.  Allergic/Immunologic: Negative for susceptible to infections.  Neurological:  Positive for numbness and weakness.  Hematological:  Positive for bruising/bleeding tendency.  Psychiatric/Behavioral:  Positive for sleep disturbance.    PMFS History:  Patient Active Problem List   Diagnosis Date Noted  . Primary osteoarthritis of left knee 11/16/2019  . Unilateral primary osteoarthritis, left knee 05/25/2019  . Trochanteric bursitis, left hip 03/07/2017  . Lymphedema 12/06/2016  . Bilateral bunions 08/21/2016  . Seasonal allergies 07/06/2016  . Asthma 07/06/2016  . Sjogren's syndrome (HCC) 07/06/2016  . Bilateral primary osteoarthritis of knee 07/06/2016  . Osteoarthritis of both hands 07/06/2016  . Myofacial muscle pain 07/06/2016  . DDD lumbar spine with spinal stenosis 07/06/2016  . Abnormality of gait 06/26/2016  . Chronic pain of right ankle 06/26/2016  . H/O bilateral hip replacements 02/24/2016  . Varicose veins 04/20/2015  . Lipidemia 04/20/2015  . Rheumatoid factor positive 11/20/2013  . Back pain with left-sided sciatica 11/20/2013  . HTN (hypertension) 11/20/2013  . GERD (gastroesophageal reflux disease) 11/20/2013  . Bladder spasm 11/20/2013  . Hyperlipidemia 11/20/2013  . Bilateral swelling of feet   . Bruises easily     Past Medical History:  Diagnosis  Date  . Anxiety   . Arthritis    left hip  . Asthma    Inhalers for tx  . Bilateral primary osteoarthritis of knee 07/06/2016   Moderate  . Bilateral swelling of feet    bilateral ankle and feet edema  . Bruises easily   . Complication of anesthesia    sometimes needs oxygen when waking uo due to asthma  . DDD (degenerative disc disease), lumbar 07/06/2016  .  Fibromyalgia   . GERD (gastroesophageal reflux disease)   . Hypertension   . Myofacial muscle pain 07/06/2016  . Osteoarthritis of both hands 07/06/2016  . Seasonal allergies 07/06/2016  . Sinus complaint   . Sjogren's syndrome (HCC)    dry eyes  . Tenonitis   . Urinary incontinence    occ. an issue wears pads    Family History  Problem Relation Age of Onset  . Arrhythmia Mother   . Stroke Mother   . Hyperlipidemia Mother   . Diabetes Mother   . Hypertension Mother   . Heart attack Father   . Diabetes Father   . Cancer Father   . Hypertension Father   . Hyperlipidemia Maternal Grandmother   . Hypertension Maternal Grandmother   . Heart attack Paternal Grandfather   . Stroke Paternal Grandfather   . Lung disease Brother   . Diabetes Sister   . Dementia Sister   . Allergies Daughter   . Healthy Daughter    Past Surgical History:  Procedure Laterality Date  . ABDOMINAL HYSTERECTOMY  1986  . BACK SURGERY  1985  . BREAST CYST ASPIRATION    . BREAST SURGERY Right    cyst drainage  . CARPAL TUNNEL RELEASE Right 2001  . KNEE SURGERY  1991  . SHOULDER SURGERY Right    right shoulder tendon and rotator cuff repair  . TOTAL HIP ARTHROPLASTY Right 2006  . TOTAL HIP ARTHROPLASTY Left 02/24/2016   Procedure: LEFT TOTAL HIP ARTHROPLASTY ANTERIOR APPROACH;  Surgeon: Kathryne Hitch, MD;  Location: [facility];  Service: Orthopedics;  Laterality: Left;  . TOTAL KNEE ARTHROPLASTY Left 11/16/2019   Procedure: TOTAL KNEE ARTHROPLASTY;  Surgeon: Ollen Gross, MD;  Location: [facility];  Service: Orthopedics;  Laterality: Left;    Social History   Social History Narrative  . Not on file   Immunization History  Administered Date(s) Administered  . PFIZER(Purple Top)SARS-COV-2 Vaccination 09/04/2019, 09/29/2019, 05/30/2020     Objective: Vital Signs: BP (!) 162/84 (BP Location: Left Arm, Patient Position: Sitting, Cuff Size: Normal)   Pulse (!) 103   Resp 16   Ht 5\' 5"   (1.651 m)   Wt 223 lb (101.2 kg)   BMI 37.11 kg/m    Physical Exam Vitals and nursing note reviewed.  Constitutional:      Appearance: She is well-developed.  HENT:     Head: Normocephalic and atraumatic.  Eyes:     Conjunctiva/sclera: Conjunctivae normal.  Cardiovascular:     Rate and Rhythm: Normal rate and regular rhythm.     Heart sounds: Normal heart sounds.  Pulmonary:     Effort: Pulmonary effort is normal.     Breath sounds: Normal breath sounds.  Abdominal:     General: Bowel sounds are normal.     Palpations: Abdomen is soft.  Musculoskeletal:     Cervical back: Normal range of motion.  Lymphadenopathy:     Cervical: No cervical adenopathy.  Skin:    General: Skin is warm and dry.  Capillary Refill: Capillary refill takes less than 2 seconds.  Neurological:     Mental Status: She is alert and oriented to person, place, and time.  Psychiatric:        Behavior: Behavior normal.     Musculoskeletal Exam: C-spine was in good range of motion.  She had painful range of motion of her left shoulder joint.  Right shoulder joints with full range of motion.  Elbow joints, wrist joints, MCPs PIPs and DIPs with good range of motion.  She had bilateral PIP and DIP thickening with no synovitis.  Hip joints were in good range of motion.  Left knee joint was replaced had warmth on palpation.  She had painful range of motion.  Right knee joint was in good range of motion.  There was no tenderness over ankles or MTPs.  CDAI Exam: CDAI Score: -- Patient Global: --; Provider Global: -- Swollen: --; Tender: -- Joint Exam 04/21/2021   No joint exam has been documented for this visit   There is currently no information documented on the homunculus. Go to the Rheumatology activity and complete the homunculus joint exam.  Investigation: No additional findings.  Imaging: No results found.  Recent Labs: Lab Results  Component Value Date   WBC 7.7 11/18/2019   HGB 11.4 (L)  11/18/2019   PLT 303 11/18/2019   NA 140 04/04/2021   K 4.4 04/04/2021   CL 103 04/04/2021   CO2 23 04/04/2021   GLUCOSE 91 04/04/2021   BUN 13 04/04/2021   CREATININE 0.68 04/04/2021   BILITOT 0.4 11/06/2019   ALKPHOS 96 11/06/2019   AST 24 11/06/2019   ALT 32 11/06/2019   PROT 7.5 11/06/2019   ALBUMIN 4.2 11/06/2019   CALCIUM 9.7 04/04/2021   GFRAA 89 09/16/2020    Speciality Comments: No specialty comments available.  Procedures:  Large Joint Inj: L glenohumeral on 04/21/2021 9:09 AM Indications: pain Details: 27 G 1.5 in needle, posterior approach  Arthrogram: No  Medications: 1.5 mL lidocaine 1 %; 40 mg triamcinolone acetonide 40 MG/ML Aspirate: 0 mL Outcome: tolerated well, no immediate complications Procedure, treatment alternatives, risks and benefits explained, specific risks discussed. Consent was given by the patient. Immediately prior to procedure a time out was called to verify the correct patient, procedure, equipment, support staff and site/side marked as required. Patient was prepped and draped in the usual sterile fashion.    Allergies: Clavulanic acid, Losartan potassium-hctz, Amoxicillin-pot clavulanate, Diclofenac-misoprostol, Latex, and Naproxen   Assessment / Plan:     Visit Diagnoses: Sicca syndrome (HCC) - +ANA, +La, +RF, dry mouth and dry eyes.  She continues to have dry mouth and dry eye symptoms.  She states her symptoms are manageable with Restasis and over-the-counter medications.  Neck pain - The x-rays show multilevel spondylosis with most severe narrowing between C7 and T1.  She continues to have some neck stiffness but had fairly good range of motion.  Chronic left shoulder pain-she has been having increased pain and discomfort in the left shoulder joint and difficulty lifting.  She requested a cortisone injection today.  After informed consent was obtained left shoulder joint was injected with lidocaine and cortisone as described above.   Postprocedure instructions were given.  She tolerated the procedure well.  A handout on shoulder joint exercises was given.  Primary osteoarthritis of both hands-she complains of pain and discomfort in her hands and also gives history of swelling.  No synovitis was noted.  She mostly had PIP and  DIP thickening.  She was given a recent prednisone taper by her PCP a month ago for increased swelling in her hands.  I advised her to contact us in case she develops swelling.  H/O bilateral hip replacements - Left total hip replacement 2017 by Dr. Magnus Ivan, right total hip replacement 2006 by Dr. Cleophas Dunker.   Bilateral primary osteoarthritis of knee-she continues to have discomfort in the bilateral knee joints mostly in her left knee joint.  Status post total knee replacement, left - April 2021 Dr.Alusio.  She continues to have discomfort.  Bilateral bunions-proper fitting shoes were discussed.  DDD (degenerative disc disease), cervical-chronic pain.  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  Fibromyalgia-she experiences generalized pain and positive tender points.  Need for regular exercise was emphasized.  She has been under a lot of stress that she is taking care of her mother's property and moved her mother to the nursing home.  Other insomnia-good sleep hygiene was discussed.  Essential hypertension-her blood pressure was elevated today.  She believes it is due to stress.  Have advised her to monitor blood pressure closely especially after the cortisone injection.  Other medical problems are listed as follows:  History of hyperlipidemia  Prediabetes  History of gastroesophageal reflux (GERD)  History of asthma  Vitamin D deficiency  Orders: Orders Placed This Encounter  Procedures  . Large Joint Inj: L glenohumeral    No orders of the defined types were placed in this encounter.    Follow-Up Instructions: Return in about 3 months (around 07/21/2021) for Sicca  syndrome, Osteoarthritis.   Pollyann Savoy, MD  Note - This record has been created using Animal nutritionist.  Chart creation errors have been sought, but may not always  have been located. Such creation errors do not reflect on  the standard of medical care.

## 2021-04-10 ENCOUNTER — Ambulatory Visit: Payer: Medicare PPO | Admitting: Rheumatology

## 2021-04-12 ENCOUNTER — Encounter: Payer: Self-pay | Admitting: Internal Medicine

## 2021-04-21 ENCOUNTER — Ambulatory Visit (INDEPENDENT_AMBULATORY_CARE_PROVIDER_SITE_OTHER): Payer: Medicare PPO | Admitting: Rheumatology

## 2021-04-21 ENCOUNTER — Encounter: Payer: Self-pay | Admitting: Rheumatology

## 2021-04-21 ENCOUNTER — Other Ambulatory Visit: Payer: Self-pay

## 2021-04-21 VITALS — BP 162/84 | HR 103 | Resp 16 | Ht 65.0 in | Wt 223.0 lb

## 2021-04-21 DIAGNOSIS — M19041 Primary osteoarthritis, right hand: Secondary | ICD-10-CM

## 2021-04-21 DIAGNOSIS — M25512 Pain in left shoulder: Secondary | ICD-10-CM

## 2021-04-21 DIAGNOSIS — M5030 Other cervical disc degeneration, unspecified cervical region: Secondary | ICD-10-CM | POA: Diagnosis not present

## 2021-04-21 DIAGNOSIS — M79641 Pain in right hand: Secondary | ICD-10-CM

## 2021-04-21 DIAGNOSIS — M21611 Bunion of right foot: Secondary | ICD-10-CM | POA: Diagnosis not present

## 2021-04-21 DIAGNOSIS — Z8639 Personal history of other endocrine, nutritional and metabolic disease: Secondary | ICD-10-CM

## 2021-04-21 DIAGNOSIS — M47816 Spondylosis without myelopathy or radiculopathy, lumbar region: Secondary | ICD-10-CM

## 2021-04-21 DIAGNOSIS — M797 Fibromyalgia: Secondary | ICD-10-CM

## 2021-04-21 DIAGNOSIS — M3500 Sicca syndrome, unspecified: Secondary | ICD-10-CM

## 2021-04-21 DIAGNOSIS — G8929 Other chronic pain: Secondary | ICD-10-CM | POA: Diagnosis not present

## 2021-04-21 DIAGNOSIS — M542 Cervicalgia: Secondary | ICD-10-CM

## 2021-04-21 DIAGNOSIS — M21612 Bunion of left foot: Secondary | ICD-10-CM

## 2021-04-21 DIAGNOSIS — E559 Vitamin D deficiency, unspecified: Secondary | ICD-10-CM

## 2021-04-21 DIAGNOSIS — Z96652 Presence of left artificial knee joint: Secondary | ICD-10-CM | POA: Diagnosis not present

## 2021-04-21 DIAGNOSIS — I10 Essential (primary) hypertension: Secondary | ICD-10-CM

## 2021-04-21 DIAGNOSIS — Z96643 Presence of artificial hip joint, bilateral: Secondary | ICD-10-CM

## 2021-04-21 DIAGNOSIS — M19042 Primary osteoarthritis, left hand: Secondary | ICD-10-CM

## 2021-04-21 DIAGNOSIS — G4709 Other insomnia: Secondary | ICD-10-CM

## 2021-04-21 DIAGNOSIS — Z8709 Personal history of other diseases of the respiratory system: Secondary | ICD-10-CM

## 2021-04-21 DIAGNOSIS — M170 Bilateral primary osteoarthritis of knee: Secondary | ICD-10-CM | POA: Diagnosis not present

## 2021-04-21 DIAGNOSIS — R7303 Prediabetes: Secondary | ICD-10-CM

## 2021-04-21 DIAGNOSIS — Z8719 Personal history of other diseases of the digestive system: Secondary | ICD-10-CM

## 2021-04-21 MED ORDER — LIDOCAINE HCL 1 % IJ SOLN
1.5000 mL | INTRAMUSCULAR | Status: AC | PRN
  Administered 2021-04-21: 1.5 mL

## 2021-04-21 MED ORDER — TRIAMCINOLONE ACETONIDE 40 MG/ML IJ SUSP
40.0000 mg | INTRAMUSCULAR | Status: AC | PRN
  Administered 2021-04-21: 40 mg via INTRA_ARTICULAR

## 2021-04-21 NOTE — Patient Instructions (Signed)
Shoulder Exercises Ask your health care provider which exercises are safe for you. Do exercises exactly as told by your health care provider and adjust them as directed. It is normal to feel mild stretching, pulling, tightness, or discomfort as you do these exercises. Stop right away if you feel sudden pain or your pain gets worse. Do not begin these exercises until told by your health care provider. Stretching exercises External rotation and abduction This exercise is sometimes called corner stretch. This exercise rotates your arm outward (external rotation) and moves your arm out from your body (abduction). Stand in a doorway with one of your feet slightly in front of the other. This is called a staggered stance. If you cannot reach your forearms to the door frame, stand facing a corner of a room. Choose one of the following positions as told by your health care provider: Place your hands and forearms on the door frame above your head. Place your hands and forearms on the door frame at the height of your head. Place your hands on the door frame at the height of your elbows. Slowly move your weight onto your front foot until you feel a stretch across your chest and in the front of your shoulders. Keep your head and chest upright and keep your abdominal muscles tight. Hold for __________ seconds. To release the stretch, shift your weight to your back foot. Repeat __________ times. Complete this exercise __________ times a day. Extension, standing Stand and hold a broomstick, a cane, or a similar object behind your back. Your hands should be a little wider than shoulder width apart. Your palms should face away from your back. Keeping your elbows straight and your shoulder muscles relaxed, move the stick away from your body until you feel a stretch in your shoulders (extension). Avoid shrugging your shoulders while you move the stick. Keep your shoulder blades tucked down toward the middle of your  back. Hold for __________ seconds. Slowly return to the starting position. Repeat __________ times. Complete this exercise __________ times a day. Range-of-motion exercises Pendulum  Stand near a wall or a surface that you can hold onto for balance. Bend at the waist and let your left / right arm hang straight down. Use your other arm to support you. Keep your back straight and do not lock your knees. Relax your left / right arm and shoulder muscles, and move your hips and your trunk so your left / right arm swings freely. Your arm should swing because of the motion of your body, not because you are using your arm or shoulder muscles. Keep moving your hips and trunk so your arm swings in the following directions, as told by your health care provider: Side to side. Forward and backward. In clockwise and counterclockwise circles. Continue each motion for __________ seconds, or for as long as told by your health care provider. Slowly return to the starting position. Repeat __________ times. Complete this exercise __________ times a day. Shoulder flexion, standing  Stand and hold a broomstick, a cane, or a similar object. Place your hands a little more than shoulder width apart on the object. Your left / right hand should be palm up, and your other hand should be palm down. Keep your elbow straight and your shoulder muscles relaxed. Push the stick up with your healthy arm to raise your left / right arm in front of your body, and then over your head until you feel a stretch in your shoulder (flexion). Avoid   shrugging your shoulder while you raise your arm. Keep your shoulder blade tucked down toward the middle of your back. Hold for __________ seconds. Slowly return to the starting position. Repeat __________ times. Complete this exercise __________ times a day. Shoulder abduction, standing Stand and hold a broomstick, a cane, or a similar object. Place your hands a little more than shoulder  width apart on the object. Your left / right hand should be palm up, and your other hand should be palm down. Keep your elbow straight and your shoulder muscles relaxed. Push the object across your body toward your left / right side. Raise your left / right arm to the side of your body (abduction) until you feel a stretch in your shoulder. Do not raise your arm above shoulder height unless your health care provider tells you to do that. If directed, raise your arm over your head. Avoid shrugging your shoulder while you raise your arm. Keep your shoulder blade tucked down toward the middle of your back. Hold for __________ seconds. Slowly return to the starting position. Repeat __________ times. Complete this exercise __________ times a day. Internal rotation  Place your left / right hand behind your back, palm up. Use your other hand to dangle an exercise band, a towel, or a similar object over your shoulder. Grasp the band with your left / right hand so you are holding on to both ends. Gently pull up on the band until you feel a stretch in the front of your left / right shoulder. The movement of your arm toward the center of your body is called internal rotation. Avoid shrugging your shoulder while you raise your arm. Keep your shoulder blade tucked down toward the middle of your back. Hold for __________ seconds. Release the stretch by letting go of the band and lowering your hands. Repeat __________ times. Complete this exercise __________ times a day. Strengthening exercises External rotation  Sit in a stable chair without armrests. Secure an exercise band to a stable object at elbow height on your left / right side. Place a soft object, such as a folded towel or a small pillow, between your left / right upper arm and your body to move your elbow about 4 inches (10 cm) away from your side. Hold the end of the exercise band so it is tight and there is no slack. Keeping your elbow pressed  against the soft object, slowly move your forearm out, away from your abdomen (external rotation). Keep your body steady so only your forearm moves. Hold for __________ seconds. Slowly return to the starting position. Repeat __________ times. Complete this exercise __________ times a day. Shoulder abduction  Sit in a stable chair without armrests, or stand up. Hold a __________ weight in your left / right hand, or hold an exercise band with both hands. Start with your arms straight down and your left / right palm facing in, toward your body. Slowly lift your left / right hand out to your side (abduction). Do not lift your hand above shoulder height unless your health care provider tells you that this is safe. Keep your arms straight. Avoid shrugging your shoulder while you do this movement. Keep your shoulder blade tucked down toward the middle of your back. Hold for __________ seconds. Slowly lower your arm, and return to the starting position. Repeat __________ times. Complete this exercise __________ times a day. Shoulder extension Sit in a stable chair without armrests, or stand up. Secure an exercise band   to a stable object in front of you so it is at shoulder height. Hold one end of the exercise band in each hand. Your palms should face each other. Straighten your elbows and lift your hands up to shoulder height. Step back, away from the secured end of the exercise band, until the band is tight and there is no slack. Squeeze your shoulder blades together as you pull your hands down to the sides of your thighs (extension). Stop when your hands are straight down by your sides. Do not let your hands go behind your body. Hold for __________ seconds. Slowly return to the starting position. Repeat __________ times. Complete this exercise __________ times a day. Shoulder row Sit in a stable chair without armrests, or stand up. Secure an exercise band to a stable object in front of you so it  is at waist height. Hold one end of the exercise band in each hand. Position your palms so that your thumbs are facing the ceiling (neutral position). Bend each of your elbows to a 90-degree angle (right angle) and keep your upper arms at your sides. Step back until the band is tight and there is no slack. Slowly pull your elbows back behind you. Hold for __________ seconds. Slowly return to the starting position. Repeat __________ times. Complete this exercise __________ times a day. Shoulder press-ups  Sit in a stable chair that has armrests. Sit upright, with your feet flat on the floor. Put your hands on the armrests so your elbows are bent and your fingers are pointing forward. Your hands should be about even with the sides of your body. Push down on the armrests and use your arms to lift yourself off the chair. Straighten your elbows and lift yourself up as much as you comfortably can. Move your shoulder blades down, and avoid letting your shoulders move up toward your ears. Keep your feet on the ground. As you get stronger, your feet should support less of your body weight as you lift yourself up. Hold for __________ seconds. Slowly lower yourself back into the chair. Repeat __________ times. Complete this exercise __________ times a day. Wall push-ups  Stand so you are facing a stable wall. Your feet should be about one arm-length away from the wall. Lean forward and place your palms on the wall at shoulder height. Keep your feet flat on the floor as you bend your elbows and lean forward toward the wall. Hold for __________ seconds. Straighten your elbows to push yourself back to the starting position. Repeat __________ times. Complete this exercise __________ times a day. This information is not intended to replace advice given to you by your health care provider. Make sure you discuss any questions you have with your healthcare provider. Document Revised: 10/31/2018 Document  Reviewed: 08/08/2018 [employer] Patient Education  2022 [employer]  

## 2021-04-26 ENCOUNTER — Other Ambulatory Visit: Payer: Self-pay

## 2021-04-26 ENCOUNTER — Ambulatory Visit (INDEPENDENT_AMBULATORY_CARE_PROVIDER_SITE_OTHER)
Admission: RE | Admit: 2021-04-26 | Discharge: 2021-04-26 | Disposition: A | Discharge status: Home / Self Care | Payer: Self-pay | Source: Ambulatory Visit | Attending: Internal Medicine | Admitting: Internal Medicine

## 2021-04-26 DIAGNOSIS — E785 Hyperlipidemia, unspecified: Secondary | ICD-10-CM

## 2021-06-06 ENCOUNTER — Other Ambulatory Visit: Payer: Self-pay | Admitting: Internal Medicine

## 2021-06-20 ENCOUNTER — Encounter: Payer: Self-pay | Admitting: Rheumatology

## 2021-06-20 ENCOUNTER — Ambulatory Visit (INDEPENDENT_AMBULATORY_CARE_PROVIDER_SITE_OTHER): Payer: Medicare PPO | Admitting: Rheumatology

## 2021-06-20 ENCOUNTER — Other Ambulatory Visit: Payer: Self-pay

## 2021-06-20 VITALS — BP 141/83 | HR 86 | Ht 65.0 in | Wt 221.4 lb

## 2021-06-20 DIAGNOSIS — M5030 Other cervical disc degeneration, unspecified cervical region: Secondary | ICD-10-CM

## 2021-06-20 DIAGNOSIS — M533 Sacrococcygeal disorders, not elsewhere classified: Secondary | ICD-10-CM | POA: Diagnosis not present

## 2021-06-20 DIAGNOSIS — M19012 Primary osteoarthritis, left shoulder: Secondary | ICD-10-CM | POA: Diagnosis not present

## 2021-06-20 DIAGNOSIS — G4709 Other insomnia: Secondary | ICD-10-CM

## 2021-06-20 DIAGNOSIS — Z96652 Presence of left artificial knee joint: Secondary | ICD-10-CM

## 2021-06-20 DIAGNOSIS — I10 Essential (primary) hypertension: Secondary | ICD-10-CM

## 2021-06-20 DIAGNOSIS — M19042 Primary osteoarthritis, left hand: Secondary | ICD-10-CM

## 2021-06-20 DIAGNOSIS — Z8719 Personal history of other diseases of the digestive system: Secondary | ICD-10-CM

## 2021-06-20 DIAGNOSIS — M21611 Bunion of right foot: Secondary | ICD-10-CM

## 2021-06-20 DIAGNOSIS — M170 Bilateral primary osteoarthritis of knee: Secondary | ICD-10-CM | POA: Diagnosis not present

## 2021-06-20 DIAGNOSIS — R7303 Prediabetes: Secondary | ICD-10-CM

## 2021-06-20 DIAGNOSIS — M21612 Bunion of left foot: Secondary | ICD-10-CM

## 2021-06-20 DIAGNOSIS — M797 Fibromyalgia: Secondary | ICD-10-CM

## 2021-06-20 DIAGNOSIS — Z8639 Personal history of other endocrine, nutritional and metabolic disease: Secondary | ICD-10-CM

## 2021-06-20 DIAGNOSIS — M7062 Trochanteric bursitis, left hip: Secondary | ICD-10-CM

## 2021-06-20 DIAGNOSIS — G8929 Other chronic pain: Secondary | ICD-10-CM

## 2021-06-20 DIAGNOSIS — M3500 Sicca syndrome, unspecified: Secondary | ICD-10-CM | POA: Diagnosis not present

## 2021-06-20 DIAGNOSIS — M19041 Primary osteoarthritis, right hand: Secondary | ICD-10-CM | POA: Diagnosis not present

## 2021-06-20 DIAGNOSIS — Z96643 Presence of artificial hip joint, bilateral: Secondary | ICD-10-CM

## 2021-06-20 DIAGNOSIS — E559 Vitamin D deficiency, unspecified: Secondary | ICD-10-CM

## 2021-06-20 DIAGNOSIS — Z8709 Personal history of other diseases of the respiratory system: Secondary | ICD-10-CM

## 2021-06-20 DIAGNOSIS — M47816 Spondylosis without myelopathy or radiculopathy, lumbar region: Secondary | ICD-10-CM

## 2021-06-20 MED ORDER — LIDOCAINE HCL 1 % IJ SOLN
1.5000 mL | INTRAMUSCULAR | Status: AC | PRN
  Administered 2021-06-20: 1.5 mL

## 2021-06-20 MED ORDER — TRIAMCINOLONE ACETONIDE 40 MG/ML IJ SUSP
40.0000 mg | INTRAMUSCULAR | Status: AC | PRN
  Administered 2021-06-20: 40 mg via INTRA_ARTICULAR

## 2021-06-20 NOTE — Patient Instructions (Signed)
Hand Exercises °Hand exercises can be helpful for almost anyone. These exercises can strengthen the hands, improve flexibility and movement, and increase blood flow to the hands. These results can make work and daily tasks easier. °Hand exercises can be especially helpful for people who have joint pain from arthritis or have nerve damage from overuse (carpal tunnel syndrome). °These exercises can also help people who have injured a hand. °Exercises °Most of these hand exercises are gentle stretching and motion exercises. It is usually safe to do them often throughout the day. Warming up your hands before exercise may help to reduce stiffness. You can do this with gentle massage or by placing your hands in warm water for 10-15 minutes. °It is normal to feel some stretching, pulling, tightness, or mild discomfort as you begin new exercises. This will gradually improve. Stop an exercise right away if you feel sudden, severe pain or your pain gets worse. Ask your health care provider which exercises are best for you. °Knuckle bend or "claw" fist ° °Stand or sit with your arm, hand, and all five fingers pointed straight up. Make sure to keep your wrist straight during the exercise. °Gently bend your fingers down toward your palm until the tips of your fingers are touching the top of your palm. Keep your big knuckle straight and just bend the small knuckles in your fingers. °Hold this position for __________ seconds. °Straighten (extend) your fingers back to the starting position. °Repeat this exercise 5-10 times with each hand. °Full finger fist ° °Stand or sit with your arm, hand, and all five fingers pointed straight up. Make sure to keep your wrist straight during the exercise. °Gently bend your fingers into your palm until the tips of your fingers are touching the middle of your palm. °Hold this position for __________ seconds. °Extend your fingers back to the starting position, stretching every joint fully. °Repeat  this exercise 5-10 times with each hand. °Straight fist °Stand or sit with your arm, hand, and all five fingers pointed straight up. Make sure to keep your wrist straight during the exercise. °Gently bend your fingers at the big knuckle, where your fingers meet your hand, and the middle knuckle. Keep the knuckle at the tips of your fingers straight and try to touch the bottom of your palm. °Hold this position for __________ seconds. °Extend your fingers back to the starting position, stretching every joint fully. °Repeat this exercise 5-10 times with each hand. °Tabletop ° °Stand or sit with your arm, hand, and all five fingers pointed straight up. Make sure to keep your wrist straight during the exercise. °Gently bend your fingers at the big knuckle, where your fingers meet your hand, as far down as you can while keeping the small knuckles in your fingers straight. Think of forming a tabletop with your fingers. °Hold this position for __________ seconds. °Extend your fingers back to the starting position, stretching every joint fully. °Repeat this exercise 5-10 times with each hand. °Finger spread ° °Place your hand flat on a table with your palm facing down. Make sure your wrist stays straight as you do this exercise. °Spread your fingers and thumb apart from each other as far as you can until you feel a gentle stretch. Hold this position for __________ seconds. °Bring your fingers and thumb tight together again. Hold this position for __________ seconds. °Repeat this exercise 5-10 times with each hand. °Making circles ° °Stand or sit with your arm, hand, and all five fingers pointed   straight up. Make sure to keep your wrist straight during the exercise. °Make a circle by touching the tip of your thumb to the tip of your index finger. °Hold for __________ seconds. Then open your hand wide. °Repeat this motion with your thumb and each finger on your hand. °Repeat this exercise 5-10 times with each hand. °Thumb  motion ° °Sit with your forearm resting on a table and your wrist straight. Your thumb should be facing up toward the ceiling. Keep your fingers relaxed as you move your thumb. °Lift your thumb up as high as you can toward the ceiling. Hold for __________ seconds. °Bend your thumb across your palm as far as you can, reaching the tip of your thumb for the small finger (pinkie) side of your palm. Hold for __________ seconds. °Repeat this exercise 5-10 times with each hand. °Grip strengthening ° °Hold a stress ball or other soft ball in the middle of your hand. °Slowly increase the pressure, squeezing the ball as much as you can without causing pain. Think of bringing the tips of your fingers into the middle of your palm. All of your finger joints should bend when doing this exercise. °Hold your squeeze for __________ seconds, then relax. °Repeat this exercise 5-10 times with each hand. °Contact a health care provider if: °Your hand pain or discomfort gets much worse when you do an exercise. °Your hand pain or discomfort does not improve within 2 hours after you exercise. °If you have any of these problems, stop doing these exercises right away. Do not do them again unless your health care provider says that you can. °Get help right away if: °You develop sudden, severe hand pain or swelling. If this happens, stop doing these exercises right away. Do not do them again unless your health care provider says that you can. °This information is not intended to replace advice given to you by your health care provider. Make sure you discuss any questions you have with your health care provider. °Document Revised: 10/27/2020 Document Reviewed: 10/27/2020 °Elsevier Patient Education © 2022 [employer] ° °Back Exercises °The following exercises strengthen the muscles that help to support the trunk (torso) and back. They also help to keep the lower back flexible. Doing these exercises can help to prevent or lessen existing low  back pain. °If you have back pain or discomfort, try doing these exercises 2-3 times each day or as told by your health care provider. °As your pain improves, do them once each day, but increase the number of times that you repeat the steps for each exercise (do more repetitions). °To prevent the recurrence of back pain, continue to do these exercises once each day or as told by your health care provider. °Do exercises exactly as told by your health care provider and adjust them as directed. It is normal to feel mild stretching, pulling, tightness, or discomfort as you do these exercises, but you should stop right away if you feel sudden pain or your pain gets worse. °Exercises °Single knee to chest °Repeat these steps 3-5 times for each leg: °Lie on your back on a firm bed or the floor with your legs extended. °Bring one knee to your chest. Your other leg should stay extended and in contact with the floor. °Hold your knee in place by grabbing your knee or thigh with both hands and hold. °Pull on your knee until you feel a gentle stretch in your lower back or buttocks. °Hold the stretch for 10-30   seconds. °Slowly release and straighten your leg. ° °Pelvic tilt °Repeat these steps 5-10 times: °Lie on your back on a firm bed or the floor with your legs extended. °Bend your knees so they are pointing toward the ceiling and your feet are flat on the floor. °Tighten your lower abdominal muscles to press your lower back against the floor. This motion will tilt your pelvis so your tailbone points up toward the ceiling instead of pointing to your feet or the floor. °With gentle tension and even breathing, hold this position for 5-10 seconds. ° °Cat-cow °Repeat these steps until your lower back becomes more flexible: °Get into a hands-and-knees position on a firm bed or the floor. Keep your hands under your shoulders, and keep your knees under your hips. You may place padding under your knees for comfort. °Let your head hang  down toward your chest. Contract your abdominal muscles and point your tailbone toward the floor so your lower back becomes rounded like the back of a cat. °Hold this position for 5 seconds. °Slowly lift your head, let your abdominal muscles relax, and point your tailbone up toward the ceiling so your back forms a sagging arch like the back of a cow. °Hold this position for 5 seconds. ° °Press-ups °Repeat these steps 5-10 times: °Lie on your abdomen (face-down) on a firm bed or the floor. °Place your palms near your head, about shoulder-width apart. °Keeping your back as relaxed as possible and keeping your hips on the floor, slowly straighten your arms to raise the top half of your body and lift your shoulders. Do not use your back muscles to raise your upper torso. You may adjust the placement of your hands to make yourself more comfortable. °Hold this position for 5 seconds while you keep your back relaxed. °Slowly return to lying flat on the floor. ° °Bridges °Repeat these steps 10 times: °Lie on your back on a firm bed or the floor. °Bend your knees so they are pointing toward the ceiling and your feet are flat on the floor. Your arms should be flat at your sides, next to your body. °Tighten your buttocks muscles and lift your buttocks off the floor until your waist is at almost the same height as your knees. You should feel the muscles working in your buttocks and the back of your thighs. If you do not feel these muscles, slide your feet 1-2 inches (2.5-5 cm) farther away from your buttocks. °Hold this position for 3-5 seconds. °Slowly lower your hips to the starting position, and allow your buttocks muscles to relax completely. °If this exercise is too easy, try doing it with your arms crossed over your chest. °Abdominal crunches °Repeat these steps 5-10 times: °Lie on your back on a firm bed or the floor with your legs extended. °Bend your knees so they are pointing toward the ceiling and your feet are flat  on the floor. °Cross your arms over your chest. °Tip your chin slightly toward your chest without bending your neck. °Tighten your abdominal muscles and slowly raise your torso high enough to lift your shoulder blades a tiny bit off the floor. Avoid raising your torso higher than that because it can put too much stress on your lower back and does not help to strengthen your abdominal muscles. °Slowly return to your starting position. ° °Back lifts °Repeat these steps 5-10 times: °Lie on your abdomen (face-down) with your arms at your sides, and rest your forehead on the floor. °  Tighten the muscles in your legs and your buttocks. °Slowly lift your chest off the floor while you keep your hips pressed to the floor. Keep the back of your head in line with the curve in your back. Your eyes should be looking at the floor. °Hold this position for 3-5 seconds. °Slowly return to your starting position. ° °Contact a health care provider if: °Your back pain or discomfort gets much worse when you do an exercise. °Your worsening back pain or discomfort does not lessen within 2 hours after you exercise. °If you have any of these problems, stop doing these exercises right away. Do not do them again unless your health care provider says that you can. °Get help right away if: °You develop sudden, severe back pain. If this happens, stop doing the exercises right away. Do not do them again unless your health care provider says that you can. °This information is not intended to replace advice given to you by your health care provider. Make sure you discuss any questions you have with your health care provider. °Document Revised: 01/03/2021 Document Reviewed: 09/21/2020 °Elsevier Patient Education © 2022 [employer] ° ° °

## 2021-06-20 NOTE — Progress Notes (Signed)
Office Visit Note  Patient: Kathy Duran             Date of Birth: 10-25-1945           MRN: [id number]             PCP: Wenda Low, MD Referring: Wenda Low, MD Visit Date: 06/20/2021 Occupation: @GUAROCC @  Subjective:  Pain in multiple joints.   History of Present Illness: Kathy Duran is a 75 y.o. female with a history of sicca symptoms, osteoarthritis and degenerative disc disease.  She states over-the-counter products and Restasis has been helpful in controlling her sicca symptoms.  She continues to have pain and discomfort in her bilateral hands.  She describes increased discomfort in her CMC joints.  She has been having pain and discomfort in her lower back, left hip, and both knees.  Her left knee joint is replaced.  She has been having difficulty walking.  She will be traveling in December and would like to have an injection in her left SI joint.  Activities of Daily Living:  Patient reports morning stiffness for all day. Patient Reports nocturnal pain.  Difficulty dressing/grooming: Reports Difficulty climbing stairs: Reports Difficulty getting out of chair: Denies Difficulty using hands for taps, buttons, cutlery, and/or writing: Reports  Review of Systems  Constitutional:  Positive for fatigue.  HENT:  Positive for mouth dryness and nose dryness. Negative for mouth sores.   Eyes:  Positive for dryness. Negative for pain and itching.  Respiratory:  Negative for shortness of breath and difficulty breathing.   Cardiovascular:  Negative for chest pain and palpitations.  Gastrointestinal:  Negative for blood in stool, constipation and diarrhea.  Endocrine: Negative for increased urination.  Genitourinary:  Negative for difficulty urinating.  Musculoskeletal:  Positive for joint pain, joint pain, joint swelling, myalgias, morning stiffness, muscle tenderness and myalgias.  Skin:  Negative for color change, rash and redness.  Allergic/Immunologic: Negative for  susceptible to infections.  Neurological:  Positive for numbness and weakness. Negative for dizziness, headaches and memory loss.  Hematological:  Positive for bruising/bleeding tendency.  Psychiatric/Behavioral:  Negative for confusion.    PMFS History:  Patient Active Problem List   Diagnosis Date Noted   Primary osteoarthritis of left knee 11/16/2019   Unilateral primary osteoarthritis, left knee 05/25/2019   Trochanteric bursitis, left hip 03/07/2017   Lymphedema 12/06/2016   Bilateral bunions 08/21/2016   Seasonal allergies 07/06/2016   Asthma 07/06/2016   Sjogren's syndrome ([place]) 07/06/2016   Bilateral primary osteoarthritis of knee 07/06/2016   Osteoarthritis of both hands 07/06/2016   Myofacial muscle pain 07/06/2016   DDD lumbar spine with spinal stenosis 07/06/2016   Abnormality of gait 06/26/2016   Chronic pain of right ankle 06/26/2016   H/O bilateral hip replacements 02/24/2016   Varicose veins 04/20/2015   Lipidemia 04/20/2015   Rheumatoid factor positive 11/20/2013   Back pain with left-sided sciatica 11/20/2013   HTN (hypertension) 11/20/2013   GERD (gastroesophageal reflux disease) 11/20/2013   Bladder spasm 11/20/2013   Hyperlipidemia 11/20/2013   Bilateral swelling of feet    Bruises easily     Past Medical History:  Diagnosis Date   Anxiety    Arthritis    left hip   Asthma    Inhalers for tx   Bilateral primary osteoarthritis of knee 07/06/2016   Moderate   Bilateral swelling of feet    bilateral ankle and feet edema   Bruises easily    Complication of anesthesia  sometimes needs oxygen when waking uo due to asthma   DDD (degenerative disc disease), lumbar 07/06/2016   Fibromyalgia    GERD (gastroesophageal reflux disease)    Hypertension    Myofacial muscle pain 07/06/2016   Osteoarthritis of both hands 07/06/2016   Seasonal allergies 07/06/2016   Sinus complaint    Sjogren's syndrome (HCC)    dry eyes   Tenonitis    Urinary  incontinence    occ. an issue wears pads    Family History  Problem Relation Age of Onset   Arrhythmia Mother    Stroke Mother    Hyperlipidemia Mother    Diabetes Mother    Hypertension Mother    Heart attack Father    Diabetes Father    Cancer Father    Hypertension Father    Hyperlipidemia Maternal Grandmother    Hypertension Maternal Grandmother    Heart attack Paternal Grandfather    Stroke Paternal Grandfather    Lung disease Brother    Diabetes Sister    Dementia Sister    Allergies Daughter    Healthy Daughter    Past Surgical History:  Procedure Laterality Date   ABDOMINAL HYSTERECTOMY  1986   BACK SURGERY  1985   BREAST CYST ASPIRATION     BREAST SURGERY Right    cyst drainage   CARPAL TUNNEL RELEASE Right 2001   [place] Right    right shoulder tendon and rotator cuff repair   TOTAL HIP ARTHROPLASTY Right 2006   TOTAL HIP ARTHROPLASTY Left 02/24/2016   Procedure: LEFT TOTAL HIP ARTHROPLASTY ANTERIOR APPROACH;  Surgeon: Mcarthur Rossetti, MD;  Location: [facility];  Service: Orthopedics;  Laterality: Left;   TOTAL KNEE ARTHROPLASTY Left 11/16/2019   Procedure: TOTAL KNEE ARTHROPLASTY;  Surgeon: Gaynelle Arabian, MD;  Location: [facility];  Service: Orthopedics;  Laterality: Left;  67min   Social History   Social History Narrative   Not on file   Immunization History  Administered Date(s) Administered   PFIZER(Purple Top)SARS-COV-2 Vaccination 09/04/2019, 09/29/2019, 05/30/2020     Objective: Vital Signs: BP (!) 141/83 (BP Location: Right Arm, Patient Position: Sitting, Cuff Size: Large)   Pulse 86   Ht 5\' 5"  (1.651 m)   Wt 221 lb 6.4 oz (100.4 kg)   BMI 36.84 kg/m    Physical Exam Vitals and nursing note reviewed.  Constitutional:      Appearance: She is well-developed.  HENT:     Head: Normocephalic and atraumatic.  Eyes:     Conjunctiva/sclera: Conjunctivae normal.  Cardiovascular:     Rate and Rhythm: Normal rate  and regular rhythm.     Heart sounds: Normal heart sounds.  Pulmonary:     Effort: Pulmonary effort is normal.     Breath sounds: Normal breath sounds.  Abdominal:     General: Bowel sounds are normal.     Palpations: Abdomen is soft.  Musculoskeletal:     Cervical back: Normal range of motion.  Lymphadenopathy:     Cervical: No cervical adenopathy.  Skin:    General: Skin is warm and dry.     Capillary Refill: Capillary refill takes less than 2 seconds.  Neurological:     Mental Status: She is alert and oriented to person, place, and time.  Psychiatric:        Behavior: Behavior normal.     Musculoskeletal Exam: C-spine was in good range of motion.  She had discomfort range of motion of  her lumbar spine.  She had tenderness over left SI joint and left trochanteric area.  She had discomfort range of motion of her shoulder joints.  Elbow joints and wrist joints with good range of motion.  She had tenderness over bilateral CMC joints.  Some PIP and DIP thickening was noted.  Hip joints with good range of motion.  Left knee joint was replaced and painful range of motion.  Right knee joint was in good range of motion with discomfort without any warmth, swelling or effusion.  There was no tenderness over ankles or MTPs.  CDAI Exam: CDAI Score: -- Patient Global: --; Provider Global: -- Swollen: --; Tender: -- Joint Exam 06/20/2021   No joint exam has been documented for this visit   There is currently no information documented on the homunculus. Go to the Rheumatology activity and complete the homunculus joint exam.  Investigation: No additional findings.  Imaging: No results found.  Recent Labs: Lab Results  Component Value Date   WBC 7.7 11/18/2019   HGB 11.4 (L) 11/18/2019   PLT 303 11/18/2019   NA 140 04/04/2021   K 4.4 04/04/2021   CL 103 04/04/2021   CO2 23 04/04/2021   GLUCOSE 91 04/04/2021   BUN 13 04/04/2021   CREATININE 0.68 04/04/2021   BILITOT 0.4 11/06/2019    ALKPHOS 96 11/06/2019   AST 24 11/06/2019   ALT 32 11/06/2019   PROT 7.5 11/06/2019   ALBUMIN 4.2 11/06/2019   CALCIUM 9.7 04/04/2021   GFRAA 89 09/16/2020    Speciality Comments: No specialty comments available.  Procedures:  Large Joint Inj: L greater trochanter on 06/20/2021 2:41 PM Indications: pain Details: 27 G 1.5 in needle, lateral approach  Arthrogram: No  Medications: 1.5 mL lidocaine 1 %; 40 mg triamcinolone acetonide 40 MG/ML Aspirate: 0 mL Outcome: tolerated well, no immediate complications Procedure, treatment alternatives, risks and benefits explained, specific risks discussed. Consent was given by the patient. Immediately prior to procedure a time out was called to verify the correct patient, procedure, equipment, support staff and site/side marked as required. Patient was prepped and draped in the usual sterile fashion.    Allergies: Clavulanic acid, Losartan potassium-hctz, Amoxicillin-pot clavulanate, Diclofenac-misoprostol, Latex, and Naproxen   Assessment / Plan:     Visit Diagnoses: Sicca syndrome (HCC) - +ANA, +La, +RF, dry mouth and dry eyes.  She uses Restasis.  Her symptoms are fairly well controlled with over-the-counter products and increase fluid intake.  Primary osteoarthritis, left shoulder - Done by Dr. Kennith Gain on January 2022 showed mild acromioclavicular and glenohumeral arthritis.  Injected with lidocaine and Kenalog on April 21, 2021.  She continues to have some discomfort in her left shoulder joint.  Primary osteoarthritis of both hands-she has been having increased pain and discomfort in her CMC joints.  Hand muscle strengthening exercises were discussed.  A prescription for bilateral CMC braces was given.  H/O bilateral hip replacements -  Left total hip replacement 2017 by Dr. Ninfa Linden, right total hip replacement 2006 by Dr. Durward Fortes.  Bilateral primary osteoarthritis of knee-she has been having increased pain and discomfort in both  knees.  Left knee joint is replaced.  I will give her a prescription for right knee joint brace.  Status post total knee replacement, left - April 2021 Dr.Alusio.  Chronic pain.  Bilateral bunions  Chronic left SI joint pain-she has been having difficulty walking due to left SI joint pain.    Left trochanteric bursitis-she has been having discomfort in  the left trochanteric bursa difficulty sleeping on the left side.  She she has difficulty walking.  Per her request left trochanteric bursa was injected with lidocaine and cortisone as described above.  She tolerated the procedure well.  Postprocedure instructions were given.  IT band stretches were advised.  DDD (degenerative disc disease), cervical - She has multilevel spondylosis with severe narrowing between C7 and T1.  DDD lumbar spine with spinal stenosis-she has chronic pain and discomfort.  Fibromyalgia-she continues to have generalized pain and discomfort.  She had multiple tender points.  Other insomnia-secondary to discomfort.  Essential hypertension-blood pressure was mildly elevated today.  Have advised her to monitor blood pressure closely.  History of hyperlipidemia  Prediabetes-patient states that her blood sugar has been normal.  History of gastroesophageal reflux (GERD)  History of asthma  Vitamin D deficiency  Orders: Orders Placed This Encounter  Procedures   Sacroiliac Joint Inj   No orders of the defined types were placed in this encounter.   Follow-Up Instructions: Return in about 6 months (around 12/18/2021) for Osteoarthritis.   Pollyann Savoy, MD  Note - This record has been created using Animal nutritionist.  Chart creation errors have been sought, but may not always  have been located. Such creation errors do not reflect on  the standard of medical care.

## 2021-07-07 DIAGNOSIS — R059 Cough, unspecified: Secondary | ICD-10-CM | POA: Diagnosis not present

## 2021-07-07 DIAGNOSIS — J069 Acute upper respiratory infection, unspecified: Secondary | ICD-10-CM | POA: Diagnosis not present

## 2021-07-07 DIAGNOSIS — Z03818 Encounter for observation for suspected exposure to other biological agents ruled out: Secondary | ICD-10-CM | POA: Diagnosis not present

## 2021-07-07 DIAGNOSIS — R0981 Nasal congestion: Secondary | ICD-10-CM | POA: Diagnosis not present

## 2021-07-12 DIAGNOSIS — J4541 Moderate persistent asthma with (acute) exacerbation: Secondary | ICD-10-CM | POA: Diagnosis not present

## 2021-07-19 ENCOUNTER — Ambulatory Visit: Payer: Medicare PPO | Admitting: Physician Assistant

## 2021-08-02 ENCOUNTER — Other Ambulatory Visit: Payer: Self-pay | Admitting: Internal Medicine

## 2021-08-02 DIAGNOSIS — Z1231 Encounter for screening mammogram for malignant neoplasm of breast: Secondary | ICD-10-CM

## 2021-08-14 ENCOUNTER — Ambulatory Visit
Admission: RE | Admit: 2021-08-14 | Discharge: 2021-08-14 | Disposition: A | Discharge status: Home / Self Care | Payer: Medicare PPO | Source: Ambulatory Visit | Attending: Internal Medicine | Admitting: Internal Medicine

## 2021-08-14 DIAGNOSIS — Z1231 Encounter for screening mammogram for malignant neoplasm of breast: Secondary | ICD-10-CM | POA: Diagnosis not present

## 2021-09-06 DIAGNOSIS — M25512 Pain in left shoulder: Secondary | ICD-10-CM | POA: Diagnosis not present

## 2021-09-27 DIAGNOSIS — M19011 Primary osteoarthritis, right shoulder: Secondary | ICD-10-CM | POA: Diagnosis not present

## 2021-10-09 DIAGNOSIS — M19012 Primary osteoarthritis, left shoulder: Secondary | ICD-10-CM | POA: Diagnosis not present

## 2021-10-09 DIAGNOSIS — M19011 Primary osteoarthritis, right shoulder: Secondary | ICD-10-CM | POA: Diagnosis not present

## 2021-10-13 ENCOUNTER — Other Ambulatory Visit: Payer: Self-pay

## 2021-10-13 ENCOUNTER — Ambulatory Visit: Payer: Medicare PPO | Admitting: Internal Medicine

## 2021-10-13 ENCOUNTER — Encounter: Payer: Self-pay | Admitting: Internal Medicine

## 2021-10-13 VITALS — BP 122/74 | HR 92 | Ht 65.0 in | Wt 217.0 lb

## 2021-10-13 DIAGNOSIS — R600 Localized edema: Secondary | ICD-10-CM

## 2021-10-13 DIAGNOSIS — I10 Essential (primary) hypertension: Secondary | ICD-10-CM | POA: Diagnosis not present

## 2021-10-13 DIAGNOSIS — E785 Hyperlipidemia, unspecified: Secondary | ICD-10-CM | POA: Diagnosis not present

## 2021-10-13 NOTE — Progress Notes (Signed)
? ? ?OFFICE NOTE ? ?Chief Complaint:  ?No complaints ? ?Primary Care Physician: ?Wenda Low, MD ? ?HPI:  ?Kathy Duran is a pleasant 76 year old female with a history of premature arthritis, hypertension, dyslipidemia, gout bladder spasm, GERD and low back pain. She's also had right hip surgery.  She is a self-referral for evaluation of lower stringy swelling. She previously seen Dr. Duke Salvia in 2007 in May that a stress test at that time. She's managed to use some compression stockings for her swelling which worked. She reports the swelling is gotten worse during the day which is on her feet but improves after she elevates it at night. She's had a limited workup of her swelling before occluding venous Dopplers which showed no evidence of a DVT in 2011. She denies any varicose veins. She has been having some problems with sores on her feet. She is seeing Dr. Jerrilyn [place] [facility]. She denies any shortness of breath or chest pain. She has no orthopnea or PND. ? ?Kathy Duran returns today for followup of her venous Dopplers. This did not show any evidence for venous insufficiency. There was no evidence for deep vein thrombosis. She continues to have some swelling mostly in her left foot. She is now back in a walking boot. Her renal function is normal and BNP was low indicating this is not likely heart failure. 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 In fact in her right hand it is so significant particularly toward the wrist that she is undergoing carpal tunnel surgery later this week. ? ?Kathy Duran he was seen back in the office today in follow-up. Overall she seems to be doing fairly well although continues to have concerns about leg swelling. At times she reports some swelling in her legs including today. She's had venous Dopplers last year which failed to show any venous insufficiency. She's concerned that there  may be a cardiac etiology of this. She denies any significant shortness of breath, orthopnea or PND. ? ?I had the pleasure seeing Kathy Duran back in the office today for follow-up. She underwent an echocardiogram which showed normal systolic function mild diastolic dysfunction, but no clear etiology for her lower extremity swelling. She says she's been using compression stockings with a significant improvement in her symptoms. ? ?12/06/2016 ? ?Kathy Duran returns today for follow-up. I last saw her about 2 years ago. She recently had blood work from Dr. Deforest Hoyles with the goal and will obtain those labs. Over the past several years she's done fairly well denies any worsening shortness of breath, chest pain or associated symptoms. She still struggles with some swelling of her legs which I suspect his lymphedema. We have done previously ultrasounds of her legs without any clear evidence for venous insufficiency. She does have some improvement with compression stockings but finds them intolerable to wear when it's hot out. Blood pressure is at goal today. ? ?10/26/2019 ? ?Kathy Duran is seen today for preoperative clearance.  I last saw her back a few years ago.  She overall has been doing fairly well but struggling with knee pain.  She is desiring left total knee replacement.  She denies any new chest pain or worsening shortness of breath.  She did have labs in 2020 which showed an LDL cholesterol 126.  She is on atorvastatin 40 mg.  Her HDL is actually 83.  No known coronary disease.  She does have arthritis  which I thought was rheumatoid but might also be osteoarthritis.  She is followed by Dr. Bennie Dallas.  EKG is normal sinus rhythm.  Is on no blood thinners. ? ?04/04/2021 ? ?Kathy Duran returns today for follow-up.  She recently saw Sande Rives, PA-C in April 2022.  At the time she was having some indigestion or chest discomfort.  She underwent a stress test and echocardiogram.  Her Myoview stress test was negative for ischemia.   Echo showed mild concentric LVH, normal LVEF 60 to 65% and grade 1 diastolic dysfunction.  She was placed on Lasix but ultimately switched to Bumex.  That dose was then decreased from milligrams to a half a milligram and seems to be well-tolerated.  She noted some swelling which has improved.  She is now however having cramping in her legs.  She has a history of lipedema and appears to have no worsening edema today compared to her baseline from my recollection.  She also had lipids in January showing total cholesterol 216, HDL 80, LDL 120 and triglycerides 93.  She is on a statin based on these numbers her calculated LDL cholesterol may be much higher perhaps even well over 200 without therapy.  This could suggest a familial hyperlipidemia, there is actually in fact a family history of high cholesterol in both parents and cardiovascular disease. ? ?10/13/2021 ? ?Kathy Duran is seen today in follow-up.  Overall she seems to be doing well.  Blood pressure is now much better controlled at 122/74.  She is still struggling with some rheumatologic issues and has had some weight gain and intermittent edema although appears to have more lipedema.  She is only taking losartan 25 mg daily rather than 50 mg daily and notes that her blood pressure was a little too low on the higher dose.  EKG today shows normal sinus rhythm.  Lipids in January 2022 showed an LDL of 120.  She is actually scheduled to see her PCP this summer. ? ?PMHx:  ?Past Medical History:  ?Diagnosis Date  ? Anxiety   ? Arthritis   ? left hip  ? Asthma   ? Inhalers for tx  ? Bilateral primary osteoarthritis of knee 07/06/2016  ? Moderate  ? Bilateral swelling of feet   ? bilateral ankle and feet edema  ? Bruises easily   ? Complication of anesthesia   ? sometimes needs oxygen when waking uo due to asthma  ? DDD (degenerative disc disease), lumbar 07/06/2016  ? Fibromyalgia   ? GERD (gastroesophageal reflux disease)   ? Hypertension   ? Myofacial muscle pain  07/06/2016  ? Osteoarthritis of both hands 07/06/2016  ? Seasonal allergies 07/06/2016  ? Sinus complaint   ? Sjogren's syndrome ([place])   ? dry eyes  ? Tenonitis   ? Urinary incontinence   ? occ. an issue wears pads  ? ? ?Past Surgical History:  ?Procedure Laterality Date  ? ABDOMINAL HYSTERECTOMY  1986  ? [place]  ? BREAST CYST ASPIRATION    ? BREAST SURGERY Right   ? cyst drainage  ? CARPAL TUNNEL RELEASE Right 2001  ? KNEE SURGERY  1991  ? SHOULDER SURGERY Right   ? right shoulder tendon and rotator cuff repair  ? TOTAL HIP ARTHROPLASTY Right 2006  ? TOTAL HIP ARTHROPLASTY Left 02/24/2016  ? Procedure: LEFT TOTAL HIP ARTHROPLASTY ANTERIOR APPROACH;  Surgeon: Mcarthur Rossetti, MD;  Location: [facility];  Service: Orthopedics;  Laterality: Left;  ? TOTAL KNEE ARTHROPLASTY  Left 11/16/2019  ? Procedure: TOTAL KNEE ARTHROPLASTY;  Surgeon: Gaynelle Arabian, MD;  Location: [facility];  Service: Orthopedics;  Laterality: Left;  38min  ? ? ?FAMHx:  ?Family History  ?Problem Relation Age of Onset  ? Arrhythmia Mother   ? Stroke Mother   ? Hyperlipidemia Mother   ? Diabetes Mother   ? Hypertension Mother   ? Heart attack Father   ? Diabetes Father   ? Cancer Father   ? Hypertension Father   ? Hyperlipidemia Maternal Grandmother   ? Hypertension Maternal Grandmother   ? Heart attack Paternal Grandfather   ? Stroke Paternal Grandfather   ? Lung disease Brother   ? Diabetes Sister   ? Dementia Sister   ? Allergies Daughter   ? Healthy Daughter   ? ? ?SOCHx:  ? reports that she quit smoking about 37 years ago. Her smoking use included cigarettes. She has a 1.50 pack-year smoking history. She has never used smokeless tobacco. She reports that she does not drink alcohol and does not use drugs. ? ?ALLERGIES:  ?Allergies  ?Allergen Reactions  ? Clavulanic Acid   ? Losartan Potassium-Hctz Other (See Comments)  ?  HEADACHES  ? Amoxicillin-Pot Clavulanate Rash  ? Diclofenac-Misoprostol Itching  ?  Arthrotec  ? Latex Itching  ?  Naproxen Nausea Only  ? ? ?ROS: ?Pertinent items noted in HPI and remainder of comprehensive ROS otherwise negative. ? ?HOME MEDS: ?Current Outpatient Medications  ?Medication Sig Dispense Refill  ? acetaminophe

## 2021-10-13 NOTE — Patient Instructions (Signed)

## 2021-10-25 DIAGNOSIS — K219 Gastro-esophageal reflux disease without esophagitis: Secondary | ICD-10-CM | POA: Diagnosis not present

## 2021-10-25 DIAGNOSIS — R7303 Prediabetes: Secondary | ICD-10-CM | POA: Diagnosis not present

## 2021-10-25 DIAGNOSIS — G4700 Insomnia, unspecified: Secondary | ICD-10-CM | POA: Diagnosis not present

## 2021-10-25 DIAGNOSIS — Z0000 Encounter for general adult medical examination without abnormal findings: Secondary | ICD-10-CM | POA: Diagnosis not present

## 2021-10-25 DIAGNOSIS — E782 Mixed hyperlipidemia: Secondary | ICD-10-CM | POA: Diagnosis not present

## 2021-10-25 DIAGNOSIS — E559 Vitamin D deficiency, unspecified: Secondary | ICD-10-CM | POA: Diagnosis not present

## 2021-10-25 DIAGNOSIS — M1990 Unspecified osteoarthritis, unspecified site: Secondary | ICD-10-CM | POA: Diagnosis not present

## 2021-10-25 DIAGNOSIS — I10 Essential (primary) hypertension: Secondary | ICD-10-CM | POA: Diagnosis not present

## 2021-10-25 DIAGNOSIS — M3500 Sicca syndrome, unspecified: Secondary | ICD-10-CM | POA: Diagnosis not present

## 2021-10-25 DIAGNOSIS — J45909 Unspecified asthma, uncomplicated: Secondary | ICD-10-CM | POA: Diagnosis not present

## 2021-10-25 DIAGNOSIS — Z1389 Encounter for screening for other disorder: Secondary | ICD-10-CM | POA: Diagnosis not present

## 2021-11-01 DIAGNOSIS — M25512 Pain in left shoulder: Secondary | ICD-10-CM | POA: Diagnosis not present

## 2021-11-10 DIAGNOSIS — M25512 Pain in left shoulder: Secondary | ICD-10-CM | POA: Diagnosis not present

## 2021-11-17 DIAGNOSIS — M19012 Primary osteoarthritis, left shoulder: Secondary | ICD-10-CM | POA: Diagnosis not present

## 2021-11-17 DIAGNOSIS — M25512 Pain in left shoulder: Secondary | ICD-10-CM | POA: Diagnosis not present

## 2021-11-17 DIAGNOSIS — M75122 Complete rotator cuff tear or rupture of left shoulder, not specified as traumatic: Secondary | ICD-10-CM | POA: Diagnosis not present

## 2021-11-20 ENCOUNTER — Other Ambulatory Visit: Payer: Self-pay | Admitting: Internal Medicine

## 2021-11-22 DIAGNOSIS — M19012 Primary osteoarthritis, left shoulder: Secondary | ICD-10-CM | POA: Diagnosis not present

## 2021-11-22 DIAGNOSIS — M75102 Unspecified rotator cuff tear or rupture of left shoulder, not specified as traumatic: Secondary | ICD-10-CM | POA: Diagnosis not present

## 2021-11-23 ENCOUNTER — Telehealth: Payer: Self-pay | Admitting: *Deleted

## 2021-11-23 NOTE — Telephone Encounter (Signed)
? ?  Pre-operative Risk Assessment  ?  ?Patient Name: Kathy Duran  ?DOB: August 05, 1945 ?MRN: [id number]  ? ?  ? ?Request for Surgical Clearance   ? ?Procedure:   lt reverse shoulder arthroplasty  ? ?Date of Surgery:  Clearance TBD                              ?   ?Surgeon:  Dr  Justice Britain                 ?Surgeon's Group or Practice Name:  [facility] ?Phone number: [phone] number:  [phone] attn [place] ?  ?Type of Clearance Requested:   ?- Medical  ?  ?Type of Anesthesia:  General  ?  ?Additional requests/questions:     ? ?Signed, ?Trixie Dredge V   ?11/23/2021, 12:32 PM   ?

## 2021-11-23 NOTE — Telephone Encounter (Signed)
Patient contacted for preoperative risk assessment regarding upcoming shoulder surgery.  Patient was unable to talk by phone today. She advised to please call back next week any day with the exception of Thursday. I agreed with plan and will attempt to contact patient next week. ?

## 2021-12-05 NOTE — Progress Notes (Unsigned)
Office Visit Note  Patient: Kathy Duran             Date of Birth: [birth year]           MRN: [id number]             PCP: Georgann Housekeeper, MD Referring: Georgann Housekeeper, MD Visit Date: 12/19/2021 Occupation: @  Subjective:  Pain in multiple joints   History of Present Illness: Kathy Duran is a 76 y.o. female with history of sicca and osteoarthritis.  Patient continues to experience intermittent arthralgias and joint stiffness.  Her pain has been most severe in the left shoulder joint.  She has had difficulty raising her left arm and has had difficulty with ADLs due to severity of pain and stiffness.  She is scheduled for a left total shoulder replacement on 03/01/22.  She continues to have chronic pain in her right knee joint.  She is having to postpone a right knee replacement since she needs to have her shoulder replaced first.  She states her left knee replacement is doing well there is occasional joint stiffness.  She denies any obvious joint swelling at this time.  She has been taking Tylenol and tramadol for pain relief.  She tried a course of meloxicam but discontinued due to GI side effects.  She continues to experience intermittent pain and stiffness in both hands.  She uses arthritis compression gloves in the fall and wintertime.   Activities of Daily Living:  Patient reports morning stiffness for 1 hour.   Patient Reports nocturnal pain.  Difficulty dressing/grooming: Reports Difficulty climbing stairs: Reports Difficulty getting out of chair: Reports Difficulty using hands for taps, buttons, cutlery, and/or writing: Reports  Review of Systems  Constitutional:  Positive for fatigue.  HENT:  Positive for mouth dryness.   Eyes:  Positive for dryness.  Respiratory:  Positive for shortness of breath.   Cardiovascular:  Positive for swelling in legs/feet.  Gastrointestinal:  Positive for constipation.  Endocrine: Positive for heat intolerance, excessive thirst and  increased urination.  Genitourinary:  Negative for difficulty urinating.  Musculoskeletal:  Positive for joint pain, joint pain, joint swelling, muscle weakness, morning stiffness and muscle tenderness.  Skin:  Negative for rash.  Allergic/Immunologic: Negative for susceptible to infections.  Neurological:  Positive for numbness and weakness.  Hematological:  Positive for bruising/bleeding tendency.  Psychiatric/Behavioral:  Positive for sleep disturbance.    PMFS History:  Patient Active Problem List   Diagnosis Date Noted   Primary osteoarthritis of left knee 11/16/2019   Unilateral primary osteoarthritis, left knee 05/25/2019   Trochanteric bursitis, left hip 03/07/2017   Lymphedema 12/06/2016   Bilateral bunions 08/21/2016   Seasonal allergies 07/06/2016   Asthma 07/06/2016   Sjogren's syndrome (HCC) 07/06/2016   Bilateral primary osteoarthritis of knee 07/06/2016   Osteoarthritis of both hands 07/06/2016   Myofacial muscle pain 07/06/2016   DDD lumbar spine with spinal stenosis 07/06/2016   Abnormality of gait 06/26/2016   Chronic pain of right ankle 06/26/2016   H/O bilateral hip replacements 02/24/2016   Varicose veins 04/20/2015   Lipidemia 04/20/2015   Rheumatoid factor positive 11/20/2013   Back pain with left-sided sciatica 11/20/2013   HTN (hypertension) 11/20/2013   GERD (gastroesophageal reflux disease) 11/20/2013   Bladder spasm 11/20/2013   Hyperlipidemia 11/20/2013   Bilateral swelling of feet    Bruises easily     Past Medical History:  Diagnosis Date   Anxiety    Arthritis  left hip   Asthma    Inhalers for tx   Bilateral primary osteoarthritis of knee 07/06/2016   Moderate   Bilateral swelling of feet    bilateral ankle and feet edema   Bruises easily    Complication of anesthesia    sometimes needs oxygen when waking uo due to asthma   DDD (degenerative disc disease), lumbar 07/06/2016   Fibromyalgia    GERD (gastroesophageal reflux  disease)    Hypertension    Myofacial muscle pain 07/06/2016   Osteoarthritis of both hands 07/06/2016   Seasonal allergies 07/06/2016   Sinus complaint    Sjogren's syndrome (HCC)    dry eyes   Tenonitis    Urinary incontinence    occ. an issue wears pads    Family History  Problem Relation Age of Onset   Arrhythmia Mother    Stroke Mother    Hyperlipidemia Mother    Diabetes Mother    Hypertension Mother    Heart attack Father    Diabetes Father    Cancer Father    Hypertension Father    Hyperlipidemia Maternal Grandmother    Hypertension Maternal Grandmother    Heart attack Paternal Grandfather    Stroke Paternal Grandfather    Lung disease Brother    Diabetes Sister    Dementia Sister    Allergies Daughter    Healthy Daughter    Past Surgical History:  Procedure Laterality Date   ABDOMINAL HYSTERECTOMY  1986   BACK SURGERY  1985   BREAST CYST ASPIRATION     BREAST SURGERY Right    cyst drainage   CARPAL TUNNEL RELEASE Right 2001   KNEE SURGERY  1991   SHOULDER SURGERY Right    right shoulder tendon and rotator cuff repair   TOTAL HIP ARTHROPLASTY Right 2006   TOTAL HIP ARTHROPLASTY Left 02/24/2016   Procedure: LEFT TOTAL HIP ARTHROPLASTY ANTERIOR APPROACH;  Surgeon: Kathryne Hitchhristopher Y Blackman, MD;  Location: [facility];  Service: Orthopedics;  Laterality: Left;   TOTAL KNEE ARTHROPLASTY Left 11/16/2019   Procedure: TOTAL KNEE ARTHROPLASTY;  Surgeon: Ollen GrossAluisio, Frank, MD;  Location: [facility];  Service: Orthopedics;  Laterality: Left;  50min   Social History   Social History Narrative   Not on file   Immunization History  Administered Date(s) Administered   PFIZER(Purple Top)SARS-COV-2 Vaccination 09/04/2019, 09/29/2019, 05/30/2020     Objective: Vital Signs: BP 125/78 (BP Location: Right Arm, Patient Position: Sitting, Cuff Size: Normal)   Pulse 96   Resp 16   Ht 5\' 5"  (1.651 m)   Wt 212 lb 6.4 oz (96.3 kg)   BMI 35.35 kg/m    Physical Exam Vitals and nursing  note reviewed.  Constitutional:      Appearance: She is well-developed.  HENT:     Head: Normocephalic and atraumatic.  Eyes:     Conjunctiva/sclera: Conjunctivae normal.  Cardiovascular:     Rate and Rhythm: Normal rate and regular rhythm.     Heart sounds: Normal heart sounds.  Pulmonary:     Effort: Pulmonary effort is normal.     Breath sounds: Normal breath sounds.  Abdominal:     General: Bowel sounds are normal.     Palpations: Abdomen is soft.  Musculoskeletal:     Cervical back: Normal range of motion.  Skin:    General: Skin is warm and dry.     Capillary Refill: Capillary refill takes less than 2 seconds.  Neurological:     Mental Status: She  is alert and oriented to person, place, and time.  Psychiatric:        Behavior: Behavior normal.     Musculoskeletal Exam: C-spine has good ROM.  Shoulder joints, elbow joints, wrist joints, MCPs, PIPs, and DIPs good ROM with no synovitis. CMC joint thickening and tenderness bilaterally. Complete fist formation bilaterally. PIP and DIP thickening consistent with osteoarthritis of both hands. Hip replacements have good ROM with no groin pain. Tenderness to palpation over bilateral trochanteric bursa.  Left knee replacement has good ROM. Right knee joint has limited extension and painful flexion.  Ankle joints have good ROM with no discomfort. Pedal edema noted in bilateral LE.  CDAI Exam: CDAI Score: -- Patient Global: --; Provider Global: -- Swollen: --; Tender: -- Joint Exam 12/19/2021   No joint exam has been documented for this visit   There is currently no information documented on the homunculus. Go to the Rheumatology activity and complete the homunculus joint exam.  Investigation: No additional findings.  Imaging: No results found.  Recent Labs: Lab Results  Component Value Date   WBC 7.7 11/18/2019   HGB 11.4 (L) 11/18/2019   PLT 303 11/18/2019   NA 140 04/04/2021   K 4.4 04/04/2021   CL 103 04/04/2021    CO2 23 04/04/2021   GLUCOSE 91 04/04/2021   BUN 13 04/04/2021   CREATININE 0.68 04/04/2021   BILITOT 0.4 11/06/2019   ALKPHOS 96 11/06/2019   AST 24 11/06/2019   ALT 32 11/06/2019   PROT 7.5 11/06/2019   ALBUMIN 4.2 11/06/2019   CALCIUM 9.7 04/04/2021   GFRAA 89 09/16/2020    Speciality Comments: No specialty comments available.  Procedures:  No procedures performed Allergies: Clavulanic acid, Losartan potassium-hctz, Amoxicillin-pot clavulanate, Diclofenac-misoprostol, Latex, and Naproxen   Assessment / Plan:     Visit Diagnoses: Sicca syndrome (HCC) - +ANA, +La, +RF, dry mouth and dry eyes: Overall her sicca symptoms have been tolerable.  She has been using Restasis eyedrops on a daily basis and Biotene oral rinse for symptomatic relief.  No new or worsening symptoms.   Primary osteoarthritis, left shoulder -Painful and limited ROM of the left shoulder.  She is scheduled for a left shoulder replacement on 03/01/22.  Primary osteoarthritis of both hands: She has PIP and DIP thickening consistent with osteoarthritis of both hands.  CMC joint thickening and tenderness noted bilaterally.  She experiences intermittent pain and stiffness in both hands.  No synovitis or signs of inflammatory arthritis were noted on examination today.  She was able to make a complete fist bilaterally.  She uses topical agents as needed for symptomatic relief and uses arthritis compression gloves during the fall and winter months.  She was previously given a handout of hand exercises to perform and has been trying to continue to perform these on a regular basis.  She was given a list of natural anti-inflammatories to try taking. She was advised to notify us if she develops increased joint pain or joint swelling.  H/O bilateral hip replacements - Left total hip replacement 2017 by Dr. Magnus Ivan, right total hip replacement 2006 by Dr. Cleophas Dunker.  Doing well.  Good range of motion with no groin pain  currently.  Primary osteoarthritis of right knee: Chronic pain.  She experiences pain and stiffness after sitting for prolonged periods of time.  She was planning on having a knee replacement in the future but has had to postpone surgery due to requiring a left shoulder replacement first.  On examination  she has limited extension and painful flexion of the right knee joint.  She has been using topical agents and taking tramadol and Tylenol as needed for pain relief.  Status post total knee replacement, left - April 2021 Dr.Alusio.  Doing well overall.  She experiences stiffness intermittently.  She has been trying to remain active.  Bilateral bunions: She experiences intermittent discomfort in her feet.  She is wearing proper fitting shoes.   Trochanteric bursitis of both hips: She has tenderness palpation over bilateral trochanteric bursa.  The left trochanteric bursa was injected with cortisone at her last office visit on 06/20/2021.  Chronic left SI joint pain: Chronic pain.   DDD (degenerative disc disease), cervical - She has multilevel spondylosis with severe narrowing between C7 and T1.  She has good range of motion of the C-spine with no increased discomfort or symptoms of radiculopathy at this time.  DDD lumbar spine with spinal stenosis: Chronic pain.  Fibromyalgia: She has generalized hyperalgesia and positive tender points.  She has been taking Tylenol and tramadol for pain relief.  Other insomnia: She is taking gabapentin 300 mg at bedtime PRN.  Other medical conditions are listed as follows:  History of hyperlipidemia  Essential hypertension: Blood pressure was 125/78 today in the office.  History of gastroesophageal reflux (GERD)  Prediabetes  Vitamin D deficiency  History of asthma  Orders: No orders of the defined types were placed in this encounter.  No orders of the defined types were placed in this encounter.   Follow-Up Instructions: Return in 6 months (on  06/21/2022) for Sicca complex, Osteoarthritis.   Gearldine Bienenstock, PA-C  Note - This record has been created using Dragon software.  Chart creation errors have been sought, but may not always  have been located. Such creation errors do not reflect on  the standard of medical care.

## 2021-12-07 ENCOUNTER — Telehealth: Payer: Self-pay

## 2021-12-07 NOTE — Telephone Encounter (Signed)
Pt is scheduled for a TELE visit on 12/13/21 for pre op clearance.. consent and med rec complete.

## 2021-12-07 NOTE — Telephone Encounter (Signed)
  Patient Consent for Virtual Visit        ANWITHA Duran has provided verbal consent on 12/07/2021 for a virtual visit (video or telephone).   CONSENT FOR VIRTUAL VISIT FOR:  Kathy Duran  By participating in this virtual visit I agree to the following:  I hereby voluntarily request, consent and authorize [facility] and its employed or contracted physicians, physician assistants, nurse practitioners or other licensed health care professionals (the Practitioner), to provide me with telemedicine health care services (the "Services") as deemed necessary by the treating Practitioner. I acknowledge and consent to receive the Services by the Practitioner via telemedicine. I understand that the telemedicine visit will involve communicating with the Practitioner through live audiovisual communication technology and the disclosure of certain medical information by electronic transmission. I acknowledge that I have been given the opportunity to request an in-person assessment or other available alternative prior to the telemedicine visit and am voluntarily participating in the telemedicine visit.  I understand that I have the right to withhold or withdraw my consent to the use of telemedicine in the course of my care at any time, without affecting my right to future care or treatment, and that the Practitioner or I may terminate the telemedicine visit at any time. I understand that I have the right to inspect all information obtained and/or recorded in the course of the telemedicine visit and may receive copies of available information for a reasonable fee.  I understand that some of the potential risks of receiving the Services via telemedicine include:  Delay or interruption in medical evaluation due to technological equipment failure or disruption; Information transmitted may not be sufficient (e.g. poor resolution of images) to allow for appropriate medical decision making by the Practitioner; and/or   In rare instances, security protocols could fail, causing a breach of personal health information.  Furthermore, I acknowledge that it is my responsibility to provide information about my medical history, conditions and care that is complete and accurate to the best of my ability. I acknowledge that Practitioner's advice, recommendations, and/or decision may be based on factors not within their control, such as incomplete or inaccurate data provided by me or distortions of diagnostic images or specimens that may result from electronic transmissions. I understand that the practice of medicine is not an exact science and that Practitioner makes no warranties or guarantees regarding treatment outcomes. I acknowledge that a copy of this consent can be made available to me via my patient portal [facility] MyChart), or I can request a printed copy by calling the office of [facility].    I understand that my insurance will be billed for this visit.   I have read or had this consent read to me. I understand the contents of this consent, which adequately explains the benefits and risks of the Services being provided via telemedicine.  I have been provided ample opportunity to ask questions regarding this consent and the Services and have had my questions answered to my satisfaction. I give my informed consent for the services to be provided through the use of telemedicine in my medical care

## 2021-12-07 NOTE — Telephone Encounter (Signed)
LM to call back to schedule VV for Pre-op clearance.

## 2021-12-07 NOTE — Telephone Encounter (Signed)
    Name: Kathy Duran  DOB: Nov 13, 1945  MRN: [id number]  Primary Cardiologist: Chrystie Nose, MD  It has now been greater than 1 month since last OV. Preoperative team, please contact this patient and set up a phone call appointment for further preoperative risk assessment. Please obtain consent and complete medication review. Thank you for your help.   Laurann Montana, PA-C 12/07/2021, 8:37 AM [facility] [address] [place], [place] [address]

## 2021-12-13 ENCOUNTER — Ambulatory Visit (INDEPENDENT_AMBULATORY_CARE_PROVIDER_SITE_OTHER): Payer: Medicare PPO | Admitting: General Practice

## 2021-12-13 DIAGNOSIS — Z01810 Encounter for preprocedural cardiovascular examination: Secondary | ICD-10-CM

## 2021-12-13 NOTE — Progress Notes (Signed)
Virtual Visit via Telephone Note   Because of Shevelle T Averhart's co-morbid illnesses, she is at least at moderate risk for complications without adequate follow up.  This format is felt to be most appropriate for this patient at this time.  The patient did not have access to video technology/had technical difficulties with video requiring transitioning to audio format only (telephone).  All issues noted in this document were discussed and addressed.  No physical exam could be performed with this format.  Please refer to the patient's chart for her consent to telehealth for [facility].  Evaluation Performed:  Preoperative cardiovascular risk assessment _____________   Date:  12/13/2021   Patient ID:  Kathy Duran, DOB 02-07-1946, MRN [id number] Patient Location:  Home Provider location:   Office  Primary Care Provider:  Georgann Housekeeper, MD Primary Cardiologist:  Chrystie Nose, MD  Chief Complaint / Patient Profile   76 y.o. y/o female with a h/o dyslipidemia, HTN, lower extremity edema who is pending left reverse shoulder arthroplasty and presents today for telephonic preoperative cardiovascular risk assessment.  Past Medical History    Past Medical History:  Diagnosis Date   Anxiety    Arthritis    left hip   Asthma    Inhalers for tx   Bilateral primary osteoarthritis of knee 07/06/2016   Moderate   Bilateral swelling of feet    bilateral ankle and feet edema   Bruises easily    Complication of anesthesia    sometimes needs oxygen when waking uo due to asthma   DDD (degenerative disc disease), lumbar 07/06/2016   Fibromyalgia    GERD (gastroesophageal reflux disease)    Hypertension    Myofacial muscle pain 07/06/2016   Osteoarthritis of both hands 07/06/2016   Seasonal allergies 07/06/2016   Sinus complaint    Sjogren's syndrome (HCC)    dry eyes   Tenonitis    Urinary incontinence    occ. an issue wears pads   Past Surgical History:  Procedure Laterality  Date   ABDOMINAL HYSTERECTOMY  1986   BACK SURGERY  1985   BREAST CYST ASPIRATION     BREAST SURGERY Right    cyst drainage   CARPAL TUNNEL RELEASE Right 2001   KNEE SURGERY  1991   SHOULDER SURGERY Right    right shoulder tendon and rotator cuff repair   TOTAL HIP ARTHROPLASTY Right 2006   TOTAL HIP ARTHROPLASTY Left 02/24/2016   Procedure: LEFT TOTAL HIP ARTHROPLASTY ANTERIOR APPROACH;  Surgeon: Kathryne Hitch, MD;  Location: [facility];  Service: Orthopedics;  Laterality: Left;   TOTAL KNEE ARTHROPLASTY Left 11/16/2019   Procedure: TOTAL KNEE ARTHROPLASTY;  Surgeon: Ollen Gross, MD;  Location: [facility];  Service: Orthopedics;  Laterality: Left;     Allergies  Allergies  Allergen Reactions   Clavulanic Acid    Losartan Potassium-Hctz Other (See Comments)    HEADACHES   Amoxicillin-Pot Clavulanate Rash   Diclofenac-Misoprostol Itching    Arthrotec   Latex Itching   Naproxen Nausea Only    History of Present Illness    AMIYLAH ANASTOS is a 76 y.o. female who presents via audio/video conferencing for a telehealth visit today.  Pt was last seen in cardiology clinic on 10/13/2021 by Dr. Rennis Golden.  At that time SHAQUELLA STAMANT was doing well .  The patient is now pending procedure as outlined above. Since her last visit, she remained stable from a cardiac standpoint  Today she denies chest  pain, shortness of breath, lower extremity edema, fatigue, palpitations, melena, hematuria, hemoptysis, diaphoresis, weakness, presyncope, syncope, orthopnea, and PND.   Home Medications    Prior to Admission medications   Medication Sig Start Date End Date Taking? Authorizing Provider  acetaminophen (TYLENOL) 650 MG CR tablet Take 650 mg by mouth every morning.    [provider]  ADVAIR DISKUS 250-50 MCG/ACT AEPB Inhale 1 puff into the lungs 2 (two) times daily. 09/23/21   [provider]  albuterol (PROVENTIL) (2.5 MG/3ML) 0.083% nebulizer solution Take 2.5 mg by  nebulization as needed for wheezing.    [provider]  albuterol (VENTOLIN HFA) 108 (90 Base) MCG/ACT inhaler Inhale 1 puff into the lungs every 4 (four) hours as needed for wheezing or shortness of breath.    [provider]  atorvastatin (LIPITOR) 20 MG tablet Take 20 mg by mouth daily.    [provider]  bumetanide (BUMEX) 0.5 MG tablet TAKE 1 TABLET BY MOUTH EVERY DAY 11/21/21   Hilty, Lisette AbuKenneth C, MD  cetirizine (ZYRTEC) 10 MG tablet Take 10 mg by mouth daily as needed.    [provider]  estradiol (VIVELLE-DOT) 0.1 MG/24HR patch Place 1 patch onto the skin 2 (two) times a week. Sundays & Wednesdays.    [provider]  gabapentin (NEURONTIN) 300 MG capsule Take 300 mg by mouth at bedtime as needed. 04/10/21   [provider]  losartan (COZAAR) 25 MG tablet Take 25 mg by mouth daily. 02/02/21   [provider]  Multiple Vitamin (MULTIVITAMIN WITH MINERALS) TABS tablet Take 1 tablet by mouth daily.    [provider]  pantoprazole (PROTONIX) 40 MG tablet Take 1 tablet (40 mg total) by mouth daily. 09/09/20   Marjie SkiffGoodrich, Callie E, PA-C  RESTASIS 0.05 % ophthalmic emulsion Place 1 drop into both eyes 2 (two) times daily. 04/30/15   [provider]  traMADol (ULTRAM) 50 MG tablet Take 50 mg by mouth daily as needed. 11/25/21   [provider]  YUVAFEM 10 MCG TABS vaginal tablet Place 10 mcg vaginally 2 (two) times a week. Tuesdays & Thursdays.    [provider]    Physical Exam    Vital Signs:  Wyline CopasWanda T Montee does not have vital signs available for review today.  Given telephonic nature of communication, physical exam is limited. AAOx3. NAD. Normal affect.  Speech and respirations are unlabored.  Accessory Clinical Findings    None  Assessment & Plan    1.  Preoperative Cardiovascular Risk Assessment: Reverse shoulder arthroplasty, Dr. Rennis ChrisSupple     Primary Cardiologist: Chrystie NoseKenneth C Hilty,  MD  Chart reviewed as part of pre-operative protocol coverage. Given past medical history and time since last visit, based on ACC/AHA guidelines, Wyline CopasWanda T Mcmanigal would be at acceptable risk for the planned procedure without further cardiovascular testing.   Patient was advised that if she develops new symptoms prior to surgery to contact our office to arrange a follow-up appointment.  He verbalized understanding.   Her RCRI is a class II risk, 0.9% risk of major cardiac event.  She is able to complete greater than 4 METS of physical activity.    A copy of this note will be routed to requesting surgeon.  Time:   Today, I have spent 7 minutes with the patient with telehealth technology discussing medical history, symptoms, and management plan.  I spent greater than 10 minutes reviewing the patient's past medical history and medications prior to her  phone evaluation.   Ronney Asters, NP  12/13/2021, 8:22 AM

## 2021-12-19 ENCOUNTER — Encounter: Payer: Self-pay | Admitting: Physician Assistant

## 2021-12-19 ENCOUNTER — Ambulatory Visit: Payer: Medicare PPO | Admitting: Physician Assistant

## 2021-12-19 VITALS — BP 125/78 | HR 96 | Resp 16 | Ht 65.0 in | Wt 212.4 lb

## 2021-12-19 DIAGNOSIS — G8929 Other chronic pain: Secondary | ICD-10-CM

## 2021-12-19 DIAGNOSIS — Z96643 Presence of artificial hip joint, bilateral: Secondary | ICD-10-CM

## 2021-12-19 DIAGNOSIS — M797 Fibromyalgia: Secondary | ICD-10-CM

## 2021-12-19 DIAGNOSIS — I10 Essential (primary) hypertension: Secondary | ICD-10-CM

## 2021-12-19 DIAGNOSIS — M19042 Primary osteoarthritis, left hand: Secondary | ICD-10-CM

## 2021-12-19 DIAGNOSIS — Z8719 Personal history of other diseases of the digestive system: Secondary | ICD-10-CM

## 2021-12-19 DIAGNOSIS — Z8709 Personal history of other diseases of the respiratory system: Secondary | ICD-10-CM

## 2021-12-19 DIAGNOSIS — Z96652 Presence of left artificial knee joint: Secondary | ICD-10-CM | POA: Diagnosis not present

## 2021-12-19 DIAGNOSIS — M19041 Primary osteoarthritis, right hand: Secondary | ICD-10-CM

## 2021-12-19 DIAGNOSIS — Z8639 Personal history of other endocrine, nutritional and metabolic disease: Secondary | ICD-10-CM

## 2021-12-19 DIAGNOSIS — M1711 Unilateral primary osteoarthritis, right knee: Secondary | ICD-10-CM

## 2021-12-19 DIAGNOSIS — E559 Vitamin D deficiency, unspecified: Secondary | ICD-10-CM

## 2021-12-19 DIAGNOSIS — M19012 Primary osteoarthritis, left shoulder: Secondary | ICD-10-CM | POA: Diagnosis not present

## 2021-12-19 DIAGNOSIS — M170 Bilateral primary osteoarthritis of knee: Secondary | ICD-10-CM

## 2021-12-19 DIAGNOSIS — M533 Sacrococcygeal disorders, not elsewhere classified: Secondary | ICD-10-CM | POA: Diagnosis not present

## 2021-12-19 DIAGNOSIS — M21611 Bunion of right foot: Secondary | ICD-10-CM

## 2021-12-19 DIAGNOSIS — G4709 Other insomnia: Secondary | ICD-10-CM

## 2021-12-19 DIAGNOSIS — M5030 Other cervical disc degeneration, unspecified cervical region: Secondary | ICD-10-CM

## 2021-12-19 DIAGNOSIS — M7062 Trochanteric bursitis, left hip: Secondary | ICD-10-CM

## 2021-12-19 DIAGNOSIS — M3500 Sicca syndrome, unspecified: Secondary | ICD-10-CM

## 2021-12-19 DIAGNOSIS — M47816 Spondylosis without myelopathy or radiculopathy, lumbar region: Secondary | ICD-10-CM

## 2021-12-19 DIAGNOSIS — M7061 Trochanteric bursitis, right hip: Secondary | ICD-10-CM | POA: Diagnosis not present

## 2021-12-19 DIAGNOSIS — M21612 Bunion of left foot: Secondary | ICD-10-CM

## 2021-12-19 DIAGNOSIS — R7303 Prediabetes: Secondary | ICD-10-CM

## 2022-01-30 DIAGNOSIS — G4700 Insomnia, unspecified: Secondary | ICD-10-CM | POA: Diagnosis not present

## 2022-01-30 DIAGNOSIS — E782 Mixed hyperlipidemia: Secondary | ICD-10-CM | POA: Diagnosis not present

## 2022-01-30 DIAGNOSIS — M3500 Sicca syndrome, unspecified: Secondary | ICD-10-CM | POA: Diagnosis not present

## 2022-01-30 DIAGNOSIS — K219 Gastro-esophageal reflux disease without esophagitis: Secondary | ICD-10-CM | POA: Diagnosis not present

## 2022-01-30 DIAGNOSIS — J45909 Unspecified asthma, uncomplicated: Secondary | ICD-10-CM | POA: Diagnosis not present

## 2022-01-30 DIAGNOSIS — M1990 Unspecified osteoarthritis, unspecified site: Secondary | ICD-10-CM | POA: Diagnosis not present

## 2022-01-30 DIAGNOSIS — R7303 Prediabetes: Secondary | ICD-10-CM | POA: Diagnosis not present

## 2022-01-30 DIAGNOSIS — I10 Essential (primary) hypertension: Secondary | ICD-10-CM | POA: Diagnosis not present

## 2022-01-30 DIAGNOSIS — R300 Dysuria: Secondary | ICD-10-CM | POA: Diagnosis not present

## 2022-02-02 DIAGNOSIS — M25561 Pain in right knee: Secondary | ICD-10-CM | POA: Diagnosis not present

## 2022-02-07 DIAGNOSIS — N952 Postmenopausal atrophic vaginitis: Secondary | ICD-10-CM | POA: Diagnosis not present

## 2022-02-15 NOTE — Patient Instructions (Addendum)
SURGICAL WAITING ROOM VISITATION Patients having surgery or a procedure may have no more than 2 support people in the waiting area - these visitors may rotate.   Children under the age of 86 must have an adult with them who is not the patient. If the patient needs to stay at the hospital during part of their recovery, the visitor guidelines for inpatient rooms apply. Pre-op nurse will coordinate an appropriate time for 1 support person to accompany patient in pre-op.  This support person may not rotate.    Please refer to the [facility] website for the visitor guidelines for Inpatients (after your surgery is over and you are in a regular room).    Your procedure is scheduled on: 03/01/22   Report to [facility] Main Entrance    Report to admitting at 5:15 AM   Call this number if you have problems the morning of surgery [phone]   Do not eat food :After Midnight.   After Midnight you may have the following liquids until 4:30 AM DAY OF SURGERY  Water Non-Citrus Juices (without pulp, NO RED) Carbonated Beverages Black Coffee (NO MILK/CREAM OR CREAMERS, sugar ok)  Clear Tea (NO MILK/CREAM OR CREAMERS, sugar ok) regular and decaf                             Plain Jell-O (NO RED)                                           Fruit ices (not with fruit pulp, NO RED)                                     Popsicles (NO RED)                                                               Sports drinks like Gatorade (NO RED)               The day of surgery:  Drink ONE (1) Pre-Surgery G2 at 4:30 AM the morning of surgery. Drink in one sitting. Do not sip.  This drink was given to you during your hospital  pre-op appointment visit. Nothing else to drink after completing the  Pre-Surgery G2.          If you have questions, please contact your surgeon's office.   FOLLOW BOWEL PREP AND ANY ADDITIONAL PRE OP INSTRUCTIONS YOU RECEIVED FROM YOUR SURGEON'S OFFICE!!!     Oral Hygiene is  also important to reduce your risk of infection.                                    Remember - BRUSH YOUR TEETH THE MORNING OF SURGERY WITH YOUR REGULAR TOOTHPASTE   Take these medicines the morning of surgery with A SIP OF WATER: Tylenol, Inhaler, Atorvastatin, Zyrtec, Pantoprazole, Tramadol  You may not have any metal on your body including hair pins, jewelry, and body piercing             Do not wear make-up, lotions, powders, perfumes, or deodorant  Do not wear nail polish including gel and S&S, artificial/acrylic nails, or any other type of covering on natural nails including finger and toenails. If you have artificial nails, gel coating, etc. that needs to be removed by a nail salon please have this removed prior to surgery or surgery may need to be canceled/ delayed if the surgeon/ anesthesia feels like they are unable to be safely monitored.   Do not shave  48 hours prior to surgery.    Do not bring valuables to the hospital.  IS NOT             RESPONSIBLE   FOR VALUABLES.   Bring small overnight bag day of surgery.   DO NOT BRING YOUR HOME MEDICATIONS TO THE HOSPITAL. PHARMACY WILL DISPENSE MEDICATIONS LISTED ON YOUR MEDICATION LIST TO YOU DURING YOUR ADMISSION IN THE HOSPITAL!    Special Instructions: Bring a copy of your healthcare power of attorney and living will documents         the day of surgery if you haven't scanned them before.              Please read over the following fact sheets you were given: IF YOU HAVE QUESTIONS ABOUT YOUR PRE-OP INSTRUCTIONS PLEASE CALL [phone]- [facility] - Preparing for Surgery Before surgery, you can play an important role.  Because skin is not sterile, your skin needs to be as free of germs as possible.  You can reduce the number of germs on your skin by washing with CHG (chlorahexidine gluconate) soap before surgery.  CHG is an antiseptic cleaner which kills germs and bonds with the  skin to continue killing germs even after washing. Please DO NOT use if you have an allergy to CHG or antibacterial soaps.  If your skin becomes reddened/irritated stop using the CHG and inform your nurse when you arrive at Short Stay. Do not shave (including legs and underarms) for at least 48 hours prior to the first CHG shower.  You may shave your face/neck.  Please follow these instructions carefully:  1.  Shower with CHG Soap the night before surgery and the  morning of surgery.  2.  If you choose to wash your hair, wash your hair first as usual with your normal  shampoo.  3.  After you shampoo, rinse your hair and body thoroughly to remove the shampoo.                             4.  Use CHG as you would any other liquid soap.  You can apply chg directly to the skin and wash.  Gently with a scrungie or clean washcloth.  5.  Apply the CHG Soap to your body ONLY FROM THE NECK DOWN.   Do   not use on face/ open                           Wound or open sores. Avoid contact with eyes, ears mouth and   genitals (private parts).                       Wash  face,  Genitals (private parts) with your normal soap.             6.  Wash thoroughly, paying special attention to the area where your    surgery  will be performed.  7.  Thoroughly rinse your body with warm water from the neck down.  8.  DO NOT shower/wash with your normal soap after using and rinsing off the CHG Soap.                9.  Pat yourself dry with a clean towel.            10.  Wear clean pajamas.            11.  Place clean sheets on your bed the night of your first shower and do not  sleep with pets. Day of Surgery : Do not apply any lotions/deodorants the morning of surgery.  Please wear clean clothes to the hospital/surgery center.  FAILURE TO FOLLOW THESE INSTRUCTIONS MAY RESULT IN THE CANCELLATION OF YOUR SURGERY  PATIENT SIGNATURE_________________________________  NURSE  SIGNATURE__________________________________  ________________________________________________________________________   Kathy Duran  An incentive spirometer is a tool that can help keep your lungs clear and active. This tool measures how well you are filling your lungs with each breath. Taking long deep breaths may help reverse or decrease the chance of developing breathing (pulmonary) problems (especially infection) following: A long period of time when you are unable to move or be active. BEFORE THE PROCEDURE  If the spirometer includes an indicator to show your best effort, your nurse or respiratory therapist will set it to a desired goal. If possible, sit up straight or lean slightly forward. Try not to slouch. Hold the incentive spirometer in an upright position. INSTRUCTIONS FOR USE  Sit on the edge of your bed if possible, or sit up as far as you can in bed or on a chair. Hold the incentive spirometer in an upright position. Breathe out normally. Place the mouthpiece in your mouth and seal your lips tightly around it. Breathe in slowly and as deeply as possible, raising the piston or the ball toward the top of the column. Hold your breath for 3-5 seconds or for as long as possible. Allow the piston or ball to fall to the bottom of the column. Remove the mouthpiece from your mouth and breathe out normally. Rest for a few seconds and repeat Steps 1 through 7 at least 10 times every 1-2 hours when you are awake. Take your time and take a few normal breaths between deep breaths. The spirometer may include an indicator to show your best effort. Use the indicator as a goal to work toward during each repetition. After each set of 10 deep breaths, practice coughing to be sure your lungs are clear. If you have an incision (the cut made at the time of surgery), support your incision when coughing by placing a pillow or rolled up towels firmly against it. Once you are able to get out of  bed, walk around indoors and cough well. You may stop using the incentive spirometer when instructed by your caregiver.  RISKS AND COMPLICATIONS Take your time so you do not get dizzy or light-headed. If you are in pain, you may need to take or ask for pain medication before doing incentive spirometry. It is harder to take a deep breath if you are having pain. AFTER USE Rest and breathe slowly and easily. It can be helpful to  keep track of a log of your progress. Your caregiver can provide you with a simple table to help with this. If you are using the spirometer at home, follow these instructions: [place] IF:  You are having difficultly using the spirometer. You have trouble using the spirometer as often as instructed. Your pain medication is not giving enough relief while using the spirometer. You develop fever of 100.5 F (38.1 C) or higher. SEEK IMMEDIATE MEDICAL CARE IF:  You cough up bloody sputum that had not been present before. You develop fever of 102 F (38.9 C) or greater. You develop worsening pain at or near the incision site. MAKE SURE YOU:  Understand these instructions. Will watch your condition. Will get help right away if you are not doing well or get worse. Document Released: 11/19/2006 Document Revised: 10/01/2011 Document Reviewed: 01/20/2007 ExitCare Patient Information 2014 [employer].   ________________________________________________________________________ [facility]- Preparing for Total Shoulder Arthroplasty    Before surgery, you can play an important role. Because skin is not sterile, your skin needs to be as free of germs as possible. You can reduce the number of germs on your skin by using the following products. Benzoyl Peroxide Gel Reduces the number of germs present on the skin Applied twice a day to shoulder area starting two days before surgery    ==================================================================  Please follow  these instructions carefully:  BENZOYL PEROXIDE 5% GEL  Please do not use if you have an allergy to benzoyl peroxide.   If your skin becomes reddened/irritated stop using the benzoyl peroxide.  Starting two days before surgery, apply as follows: Apply benzoyl peroxide in the morning and at night. Apply after taking a shower. If you are not taking a shower clean entire shoulder front, back, and side along with the armpit with a clean wet washcloth.  Place a quarter-sized dollop on your shoulder and rub in thoroughly, making sure to cover the front, back, and side of your shoulder, along with the armpit.   2 days before ____ AM   ____ PM              1 day before ____ AM   ____ PM                         Do this twice a day for two days.  (Last application is the night before surgery, AFTER using the CHG soap as described below).  Do NOT apply benzoyl peroxide gel on the day of surgery.

## 2022-02-15 NOTE — Progress Notes (Addendum)
COVID Vaccine Completed: yes x3  Date of COVID positive in last 90 days: no  PCP - Georgann Housekeeper, MD Cardiologist - Zoila Shutter, MD  Cardiac clearance by Edd Fabian 12/13/21 in Epic   Chest x-ray - n/a EKG - 10/13/21 Epic Stress Test - 09/27/20 Epic ECHO - 10/04/20 Epic Cardiac Cath - n/a Pacemaker/ICD device last checked: n/a Spinal Cord Stimulator:n/a  Bowel Prep - no  Sleep Study - yes, negative CPAP -   Fasting Blood Sugar - preDM, no medications, no checks at home Checks Blood Sugar _____ times a day  Blood Thinner Instructions: n/a Aspirin Instructions: Last Dose:  Activity level: Can go up a flight of stairs and perform activities of daily living without stopping and without symptoms of chest pain or shortness of breath.    Anesthesia review:   Patient denies shortness of breath, fever, cough and chest pain at PAT appointment  Patient verbalized understanding of instructions that were given to them at the PAT appointment. Patient was also instructed that they will need to review over the PAT instructions again at home before surgery.

## 2022-02-19 ENCOUNTER — Encounter (HOSPITAL_COMMUNITY): Payer: Self-pay

## 2022-02-19 ENCOUNTER — Encounter (HOSPITAL_COMMUNITY)
Admission: RE | Admit: 2022-02-19 | Discharge: 2022-02-19 | Disposition: A | Discharge status: Home / Self Care | Payer: Medicare PPO | Source: Ambulatory Visit | Attending: Orthopedic Surgery | Admitting: Orthopedic Surgery

## 2022-02-19 VITALS — BP 164/87 | HR 87 | Temp 98.0°F | Resp 14 | Ht 65.0 in | Wt 208.0 lb

## 2022-02-19 DIAGNOSIS — R7303 Prediabetes: Secondary | ICD-10-CM | POA: Insufficient documentation

## 2022-02-19 DIAGNOSIS — Z01812 Encounter for preprocedural laboratory examination: Secondary | ICD-10-CM | POA: Insufficient documentation

## 2022-02-19 DIAGNOSIS — Z01818 Encounter for other preprocedural examination: Secondary | ICD-10-CM

## 2022-02-19 DIAGNOSIS — I10 Essential (primary) hypertension: Secondary | ICD-10-CM | POA: Diagnosis not present

## 2022-02-19 HISTORY — DX: Prediabetes: R73.03

## 2022-02-19 LAB — CBC
HCT: 39.7 % (ref 36.0–46.0)
Hemoglobin: 12.9 g/dL (ref 12.0–15.0)
MCH: 28 pg (ref 26.0–34.0)
MCHC: 32.5 g/dL (ref 30.0–36.0)
MCV: 86.3 fL (ref 80.0–100.0)
Platelets: 302 10*3/uL (ref 150–400)
RBC: 4.6 MIL/uL (ref 3.87–5.11)
RDW: 15.3 % (ref 11.5–15.5)
WBC: 5.3 10*3/uL (ref 4.0–10.5)
nRBC: 0 % (ref 0.0–0.2)

## 2022-02-19 LAB — BASIC METABOLIC PANEL
Anion gap: 9 (ref 5–15)
BUN: 11 mg/dL (ref 8–23)
CO2: 25 mmol/L (ref 22–32)
Calcium: 9.4 mg/dL (ref 8.9–10.3)
Chloride: 108 mmol/L (ref 98–111)
Creatinine, Ser: 0.46 mg/dL (ref 0.44–1.00)
GFR, Estimated: >60 mL/min (ref >60)
Glucose, Bld: 101 mg/dL — ABNORMAL HIGH (ref 70–99)
Potassium: 4 mmol/L (ref 3.5–5.1)
Sodium: 142 mmol/L (ref 135–145)

## 2022-02-19 LAB — GLUCOSE, CAPILLARY: Glucose-Capillary: 102 mg/dL — ABNORMAL HIGH (ref 70–99)

## 2022-02-19 LAB — HEMOGLOBIN A1C
Hgb A1c MFr Bld: 5.4 % (ref 4.8–5.6)
Mean Plasma Glucose: 108.28 mg/dL

## 2022-02-19 LAB — SURGICAL PCR SCREEN
MRSA, PCR: NEGATIVE
Staphylococcus aureus: NEGATIVE

## 2022-02-28 NOTE — Anesthesia Preprocedure Evaluation (Signed)
Anesthesia Evaluation  Patient identified by MRN, date of birth, ID band Patient awake    Reviewed: Allergy & Precautions, NPO status , Patient's Chart, lab work & pertinent test results  History of Anesthesia Complications Negative for: history of anesthetic complications  Airway Mallampati: II  TM Distance: >3 FB Neck ROM: Full    Dental  (+) Dental Advisory Given   Pulmonary asthma , COPD,  COPD inhaler, former smoker,    breath sounds clear to auscultation       Cardiovascular hypertension, Pt. on medications (-) angina Rhythm:Regular Rate:Normal  '22 ECHO: EF 60-65%. The LV has normal function, no regional wall motion abnormalities. There is moderate  asymmetric left ventricular hypertrophy. Grade I DD, normal RVF, mild AI  '22 Stress test: normal, no ischemia   Neuro/Psych negative neurological ROS     GI/Hepatic Neg liver ROS, GERD  Medicated and Controlled,  Endo/Other  Morbid obesitySjogren's  Renal/GU negative Renal ROS     Musculoskeletal  (+) Arthritis , Osteoarthritis,    Abdominal (+) + obese,   Peds  Hematology negative hematology ROS (+)   Anesthesia Other Findings   Reproductive/Obstetrics                            Anesthesia Physical Anesthesia Plan  ASA: 2  Anesthesia Plan: General   Post-op Pain Management: Tylenol PO (pre-op)* and Regional block*   Induction: Intravenous  PONV Risk Score and Plan: 3 and Ondansetron, Dexamethasone and Scopolamine patch - Pre-op  Airway Management Planned: Oral ETT  Additional Equipment: None  Intra-op Plan:   Post-operative Plan: Extubation in OR  Informed Consent: I have reviewed the patients History and Physical, chart, labs and discussed the procedure including the risks, benefits and alternatives for the proposed anesthesia with the patient or authorized representative who has indicated his/her understanding and  acceptance.     Dental advisory given  Plan Discussed with: CRNA and Surgeon  Anesthesia Plan Comments: (Plan routine monitors, GA with interscalene block for post op analgesia)       Anesthesia Quick Evaluation

## 2022-03-01 ENCOUNTER — Encounter (HOSPITAL_COMMUNITY): Payer: Self-pay | Admitting: Orthopedic Surgery

## 2022-03-01 ENCOUNTER — Ambulatory Visit (HOSPITAL_COMMUNITY): Payer: Medicare PPO | Admitting: Anesthesiology

## 2022-03-01 ENCOUNTER — Encounter (HOSPITAL_COMMUNITY)
Admission: RE | Disposition: A | Discharge status: Home / Self Care | Payer: Self-pay | Source: Home / Self Care | Attending: Orthopedic Surgery

## 2022-03-01 ENCOUNTER — Other Ambulatory Visit: Payer: Self-pay

## 2022-03-01 ENCOUNTER — Ambulatory Visit (HOSPITAL_BASED_OUTPATIENT_CLINIC_OR_DEPARTMENT_OTHER): Payer: Medicare PPO | Admitting: Anesthesiology

## 2022-03-01 ENCOUNTER — Ambulatory Visit (HOSPITAL_COMMUNITY)
Admission: RE | Admit: 2022-03-01 | Discharge: 2022-03-02 | Disposition: A | Discharge status: Home / Self Care | Payer: Medicare PPO | Attending: Orthopedic Surgery | Admitting: Orthopedic Surgery

## 2022-03-01 DIAGNOSIS — Z79899 Other long term (current) drug therapy: Secondary | ICD-10-CM | POA: Insufficient documentation

## 2022-03-01 DIAGNOSIS — M75102 Unspecified rotator cuff tear or rupture of left shoulder, not specified as traumatic: Secondary | ICD-10-CM | POA: Diagnosis not present

## 2022-03-01 DIAGNOSIS — M19041 Primary osteoarthritis, right hand: Secondary | ICD-10-CM | POA: Diagnosis not present

## 2022-03-01 DIAGNOSIS — M3500 Sicca syndrome, unspecified: Secondary | ICD-10-CM | POA: Insufficient documentation

## 2022-03-01 DIAGNOSIS — Z6834 Body mass index (BMI) 34.0-34.9, adult: Secondary | ICD-10-CM | POA: Diagnosis not present

## 2022-03-01 DIAGNOSIS — K219 Gastro-esophageal reflux disease without esophagitis: Secondary | ICD-10-CM | POA: Diagnosis not present

## 2022-03-01 DIAGNOSIS — Z87891 Personal history of nicotine dependence: Secondary | ICD-10-CM | POA: Diagnosis not present

## 2022-03-01 DIAGNOSIS — Z96612 Presence of left artificial shoulder joint: Secondary | ICD-10-CM | POA: Diagnosis present

## 2022-03-01 DIAGNOSIS — J449 Chronic obstructive pulmonary disease, unspecified: Secondary | ICD-10-CM | POA: Insufficient documentation

## 2022-03-01 DIAGNOSIS — M19042 Primary osteoarthritis, left hand: Secondary | ICD-10-CM | POA: Diagnosis not present

## 2022-03-01 DIAGNOSIS — I10 Essential (primary) hypertension: Secondary | ICD-10-CM | POA: Diagnosis not present

## 2022-03-01 DIAGNOSIS — M12812 Other specific arthropathies, not elsewhere classified, left shoulder: Secondary | ICD-10-CM

## 2022-03-01 DIAGNOSIS — G8918 Other acute postprocedural pain: Secondary | ICD-10-CM | POA: Diagnosis not present

## 2022-03-01 DIAGNOSIS — R7303 Prediabetes: Secondary | ICD-10-CM

## 2022-03-01 DIAGNOSIS — M19012 Primary osteoarthritis, left shoulder: Secondary | ICD-10-CM | POA: Diagnosis not present

## 2022-03-01 HISTORY — PX: REVERSE SHOULDER ARTHROPLASTY: SHX5054

## 2022-03-01 SURGERY — ARTHROPLASTY, SHOULDER, TOTAL, REVERSE
Anesthesia: General | Site: Shoulder | Laterality: Left

## 2022-03-01 MED ORDER — DEXAMETHASONE SODIUM PHOSPHATE 10 MG/ML IJ SOLN
INTRAMUSCULAR | Status: DC | PRN
  Administered 2022-03-01: 10 mg via INTRAVENOUS

## 2022-03-01 MED ORDER — PHENOL 1.4 % MT LIQD: 1.0000 | OROMUCOSAL | Status: DC | PRN

## 2022-03-01 MED ORDER — OXYCODONE-ACETAMINOPHEN 5-325 MG PO TABS: 1.0000 | ORAL_TABLET | ORAL | 0 refills | Status: DC | PRN

## 2022-03-01 MED ORDER — BISACODYL 5 MG PO TBEC: 5.0000 mg | DELAYED_RELEASE_TABLET | Freq: Every day | ORAL | Status: DC | PRN

## 2022-03-01 MED ORDER — PHENYLEPHRINE 80 MCG/ML (10ML) SYRINGE FOR IV PUSH (FOR BLOOD PRESSURE SUPPORT)
PREFILLED_SYRINGE | INTRAVENOUS | Status: AC
  Filled 2022-03-01: qty 10

## 2022-03-01 MED ORDER — FENTANYL CITRATE (PF) 100 MCG/2ML IJ SOLN
INTRAMUSCULAR | Status: AC
  Filled 2022-03-01: qty 2

## 2022-03-01 MED ORDER — PROMETHAZINE HCL 25 MG/ML IJ SOLN: 6.2500 mg | INTRAMUSCULAR | Status: DC | PRN

## 2022-03-01 MED ORDER — DIPHENHYDRAMINE HCL 12.5 MG/5ML PO ELIX: 12.5000 mg | ORAL_SOLUTION | ORAL | Status: DC | PRN

## 2022-03-01 MED ORDER — VANCOMYCIN HCL 1000 MG IV SOLR
INTRAVENOUS | Status: AC
  Filled 2022-03-01: qty 20

## 2022-03-01 MED ORDER — MENTHOL 3 MG MT LOZG: 1.0000 | LOZENGE | OROMUCOSAL | Status: DC | PRN

## 2022-03-01 MED ORDER — MEPERIDINE HCL 50 MG/ML IJ SOLN: 6.2500 mg | INTRAMUSCULAR | Status: DC | PRN

## 2022-03-01 MED ORDER — ACETAMINOPHEN 325 MG PO TABS
325.0000 mg | ORAL_TABLET | Freq: Four times a day (QID) | ORAL | Status: DC | PRN
  Administered 2022-03-01: 650 mg via ORAL
  Filled 2022-03-01: qty 2

## 2022-03-01 MED ORDER — OXYCODONE HCL 5 MG PO TABS
10.0000 mg | ORAL_TABLET | ORAL | Status: DC | PRN
  Filled 2022-03-01: qty 2

## 2022-03-01 MED ORDER — CHLORHEXIDINE GLUCONATE 0.12 % MT SOLN
15.0000 mL | Freq: Once | OROMUCOSAL | Status: AC
  Administered 2022-03-01: 15 mL via OROMUCOSAL

## 2022-03-01 MED ORDER — PROPOFOL 10 MG/ML IV BOLUS
INTRAVENOUS | Status: DC | PRN
  Administered 2022-03-01: 80 mg via INTRAVENOUS
  Administered 2022-03-01: 120 mg via INTRAVENOUS

## 2022-03-01 MED ORDER — ROCURONIUM BROMIDE 10 MG/ML (PF) SYRINGE
PREFILLED_SYRINGE | INTRAVENOUS | Status: AC
  Filled 2022-03-01: qty 10

## 2022-03-01 MED ORDER — CYCLOBENZAPRINE HCL 10 MG PO TABS
10.0000 mg | ORAL_TABLET | Freq: Three times a day (TID) | ORAL | 1 refills | Status: DC | PRN
Start: 2022-03-01 — End: 2024-02-20

## 2022-03-01 MED ORDER — ONDANSETRON HCL 4 MG/2ML IJ SOLN
INTRAMUSCULAR | Status: AC
Start: 2022-03-01 — End: ?
  Filled 2022-03-01: qty 2

## 2022-03-01 MED ORDER — LABETALOL HCL 5 MG/ML IV SOLN
INTRAVENOUS | Status: DC | PRN
  Administered 2022-03-01 (×2): 10 mg via INTRAVENOUS

## 2022-03-01 MED ORDER — ONDANSETRON HCL 4 MG/2ML IJ SOLN
INTRAMUSCULAR | Status: DC | PRN
  Administered 2022-03-01: 4 mg via INTRAVENOUS

## 2022-03-01 MED ORDER — ONDANSETRON HCL 4 MG/2ML IJ SOLN: 4.0000 mg | Freq: Four times a day (QID) | INTRAMUSCULAR | Status: DC | PRN

## 2022-03-01 MED ORDER — LACTATED RINGERS IV SOLN: INTRAVENOUS | Status: DC

## 2022-03-01 MED ORDER — MIDAZOLAM HCL 2 MG/2ML IJ SOLN
INTRAMUSCULAR | Status: AC
Start: 2022-03-01 — End: ?
  Filled 2022-03-01: qty 2

## 2022-03-01 MED ORDER — METHOCARBAMOL 500 MG PO TABS
500.0000 mg | ORAL_TABLET | Freq: Four times a day (QID) | ORAL | Status: DC | PRN
  Administered 2022-03-02: 500 mg via ORAL
  Filled 2022-03-01: qty 1

## 2022-03-01 MED ORDER — OXYCODONE HCL 5 MG/5ML PO SOLN: 5.0000 mg | Freq: Once | ORAL | Status: DC | PRN

## 2022-03-01 MED ORDER — TRAMADOL HCL 50 MG PO TABS: 50.0000 mg | ORAL_TABLET | Freq: Four times a day (QID) | ORAL | 0 refills | Status: DC | PRN

## 2022-03-01 MED ORDER — 0.9 % SODIUM CHLORIDE (POUR BTL) OPTIME
TOPICAL | Status: DC | PRN
  Administered 2022-03-01: 1000 mL

## 2022-03-01 MED ORDER — SUGAMMADEX SODIUM 200 MG/2ML IV SOLN
INTRAVENOUS | Status: DC | PRN
  Administered 2022-03-01: 200 mg via INTRAVENOUS

## 2022-03-01 MED ORDER — HYDROMORPHONE HCL 1 MG/ML IJ SOLN: 0.2500 mg | INTRAMUSCULAR | Status: DC | PRN

## 2022-03-01 MED ORDER — MIDAZOLAM HCL 5 MG/5ML IJ SOLN
INTRAMUSCULAR | Status: DC | PRN
  Administered 2022-03-01: 2 mg via INTRAVENOUS

## 2022-03-01 MED ORDER — VANCOMYCIN HCL 1000 MG IV SOLR
INTRAVENOUS | Status: DC | PRN
  Administered 2022-03-01: 1000 mg via TOPICAL

## 2022-03-01 MED ORDER — DOCUSATE SODIUM 100 MG PO CAPS
100.0000 mg | ORAL_CAPSULE | Freq: Two times a day (BID) | ORAL | Status: DC
  Administered 2022-03-01 – 2022-03-02 (×2): 100 mg via ORAL
  Filled 2022-03-01 (×2): qty 1

## 2022-03-01 MED ORDER — MAGNESIUM CITRATE PO SOLN: 1.0000 | Freq: Once | ORAL | Status: DC | PRN

## 2022-03-01 MED ORDER — METOCLOPRAMIDE HCL 5 MG/ML IJ SOLN: 5.0000 mg | Freq: Three times a day (TID) | INTRAMUSCULAR | Status: DC | PRN

## 2022-03-01 MED ORDER — ROCURONIUM BROMIDE 10 MG/ML (PF) SYRINGE
PREFILLED_SYRINGE | INTRAVENOUS | Status: DC | PRN
  Administered 2022-03-01: 60 mg via INTRAVENOUS

## 2022-03-01 MED ORDER — ORAL CARE MOUTH RINSE: 15.0000 mL | Freq: Once | OROMUCOSAL | Status: AC

## 2022-03-01 MED ORDER — CEFAZOLIN SODIUM-DEXTROSE 2-4 GM/100ML-% IV SOLN
2.0000 g | INTRAVENOUS | Status: AC
  Administered 2022-03-01: 2 g via INTRAVENOUS
  Filled 2022-03-01: qty 100

## 2022-03-01 MED ORDER — ONDANSETRON HCL 4 MG PO TABS: 4.0000 mg | ORAL_TABLET | Freq: Three times a day (TID) | ORAL | 0 refills | Status: AC | PRN

## 2022-03-01 MED ORDER — ALUM & MAG HYDROXIDE-SIMETH 200-200-20 MG/5ML PO SUSP
30.0000 mL | ORAL | Status: DC | PRN
  Administered 2022-03-01: 30 mL via ORAL
  Filled 2022-03-01: qty 30

## 2022-03-01 MED ORDER — PANTOPRAZOLE SODIUM 40 MG PO TBEC
40.0000 mg | DELAYED_RELEASE_TABLET | Freq: Every day | ORAL | Status: DC
  Administered 2022-03-02: 40 mg via ORAL
  Filled 2022-03-01: qty 1

## 2022-03-01 MED ORDER — PHENYLEPHRINE 80 MCG/ML (10ML) SYRINGE FOR IV PUSH (FOR BLOOD PRESSURE SUPPORT)
PREFILLED_SYRINGE | INTRAVENOUS | Status: DC | PRN
  Administered 2022-03-01: 160 ug via INTRAVENOUS
  Administered 2022-03-01: 80 ug via INTRAVENOUS
  Administered 2022-03-01 (×2): 160 ug via INTRAVENOUS

## 2022-03-01 MED ORDER — TRANEXAMIC ACID-NACL 1000-0.7 MG/100ML-% IV SOLN
1000.0000 mg | INTRAVENOUS | Status: AC
Start: 2022-03-01 — End: 2022-03-01
  Administered 2022-03-01: 1000 mg via INTRAVENOUS
  Filled 2022-03-01: qty 100

## 2022-03-01 MED ORDER — PHENYLEPHRINE HCL (PRESSORS) 10 MG/ML IV SOLN
INTRAVENOUS | Status: AC
Start: 2022-03-01 — End: ?
  Filled 2022-03-01: qty 1

## 2022-03-01 MED ORDER — METHOCARBAMOL 500 MG IVPB - SIMPLE MED: 500.0000 mg | Freq: Four times a day (QID) | INTRAVENOUS | Status: DC | PRN

## 2022-03-01 MED ORDER — LIDOCAINE HCL (PF) 2 % IJ SOLN
INTRAMUSCULAR | Status: AC
Start: 2022-03-01 — End: ?
  Filled 2022-03-01: qty 5

## 2022-03-01 MED ORDER — HYDROMORPHONE HCL 1 MG/ML IJ SOLN
0.5000 mg | INTRAMUSCULAR | Status: DC | PRN
  Filled 2022-03-01: qty 1

## 2022-03-01 MED ORDER — OXYCODONE HCL 5 MG PO TABS: 5.0000 mg | ORAL_TABLET | Freq: Once | ORAL | Status: DC | PRN

## 2022-03-01 MED ORDER — ONDANSETRON HCL 4 MG PO TABS: 4.0000 mg | ORAL_TABLET | Freq: Four times a day (QID) | ORAL | Status: DC | PRN

## 2022-03-01 MED ORDER — BUPIVACAINE LIPOSOME 1.3 % IJ SUSP
INTRAMUSCULAR | Status: DC | PRN
  Administered 2022-03-01: 10 mL via PERINEURAL

## 2022-03-01 MED ORDER — PHENYLEPHRINE HCL-NACL 20-0.9 MG/250ML-% IV SOLN
INTRAVENOUS | Status: DC | PRN
  Administered 2022-03-01: 30 ug/min via INTRAVENOUS

## 2022-03-01 MED ORDER — DEXAMETHASONE SODIUM PHOSPHATE 10 MG/ML IJ SOLN
INTRAMUSCULAR | Status: AC
  Filled 2022-03-01: qty 1

## 2022-03-01 MED ORDER — OXYCODONE HCL 5 MG PO TABS
5.0000 mg | ORAL_TABLET | ORAL | Status: DC | PRN
  Administered 2022-03-02 (×3): 5 mg via ORAL
  Filled 2022-03-01 (×2): qty 1

## 2022-03-01 MED ORDER — TRANEXAMIC ACID 1000 MG/10ML IV SOLN: 1000.0000 mg | INTRAVENOUS | Status: DC

## 2022-03-01 MED ORDER — LIDOCAINE 2% (20 MG/ML) 5 ML SYRINGE
INTRAMUSCULAR | Status: DC | PRN
  Administered 2022-03-01: 40 mg via INTRAVENOUS

## 2022-03-01 MED ORDER — POLYETHYLENE GLYCOL 3350 17 G PO PACK: 17.0000 g | PACK | Freq: Every day | ORAL | Status: DC | PRN

## 2022-03-01 MED ORDER — BUPIVACAINE-EPINEPHRINE (PF) 0.5% -1:200000 IJ SOLN
INTRAMUSCULAR | Status: DC | PRN
  Administered 2022-03-01: 10 mL via PERINEURAL

## 2022-03-01 MED ORDER — METOCLOPRAMIDE HCL 5 MG PO TABS: 5.0000 mg | ORAL_TABLET | Freq: Three times a day (TID) | ORAL | Status: DC | PRN

## 2022-03-01 MED ORDER — MIDAZOLAM HCL 2 MG/2ML IJ SOLN: 0.5000 mg | Freq: Once | INTRAMUSCULAR | Status: DC | PRN

## 2022-03-01 MED ORDER — FENTANYL CITRATE (PF) 100 MCG/2ML IJ SOLN
INTRAMUSCULAR | Status: DC | PRN
  Administered 2022-03-01 (×2): 50 ug via INTRAVENOUS

## 2022-03-01 SURGICAL SUPPLY — 78 items
ADH SKN CLS APL DERMABOND .7 (GAUZE/BANDAGES/DRESSINGS) ×1
AID PSTN UNV HD RSTRNT DISP (MISCELLANEOUS) ×1
BAG COUNTER SPONGE SURGICOUNT (BAG) IMPLANT
BAG SPEC THK2 15X12 ZIP CLS (MISCELLANEOUS) ×1
BAG SPNG CNTER NS LX DISP (BAG)
BAG ZIPLOCK 12X15 (MISCELLANEOUS) ×2 IMPLANT
BLADE SAW SGTL 83.5X18.5 (BLADE) ×2 IMPLANT
BNDG CMPR 5X4 CHSV STRCH STRL (GAUZE/BANDAGES/DRESSINGS) ×1
BNDG COHESIVE 4X5 TAN STRL LF (GAUZE/BANDAGES/DRESSINGS) ×2 IMPLANT
BSPLAT GLND +2X24 MDLR (Joint) ×1 IMPLANT
COOLER ICEMAN CLASSIC (MISCELLANEOUS) ×2 IMPLANT
COVER BACK TABLE 60X90IN (DRAPES) ×2 IMPLANT
COVER SURGICAL LIGHT HANDLE (MISCELLANEOUS) ×2 IMPLANT
CUP SUT UNIV REVERS 36 NEUTRAL (Cup) ×1 IMPLANT
DERMABOND ADVANCED (GAUZE/BANDAGES/DRESSINGS) ×1
DERMABOND ADVANCED .7 DNX12 (GAUZE/BANDAGES/DRESSINGS) ×1 IMPLANT
DRAPE INCISE IOBAN 66X45 STRL (DRAPES) IMPLANT
DRAPE ORTHO SPLIT 77X108 STRL (DRAPES) ×4
DRAPE SHEET LG 3/4 BI-LAMINATE (DRAPES) ×2 IMPLANT
DRAPE SURG 17X11 SM STRL (DRAPES) ×2 IMPLANT
DRAPE SURG ORHT 6 SPLT 77X108 (DRAPES) ×2 IMPLANT
DRAPE TOP 10253 STERILE (DRAPES) ×2 IMPLANT
DRAPE U-SHAPE 47X51 STRL (DRAPES) ×2 IMPLANT
DRESSING AQUACEL AG SP 3.5X6 (GAUZE/BANDAGES/DRESSINGS) ×1 IMPLANT
DRSG AQUACEL AG ADV 3.5X 6 (GAUZE/BANDAGES/DRESSINGS) ×1 IMPLANT
DRSG AQUACEL AG ADV 3.5X10 (GAUZE/BANDAGES/DRESSINGS) IMPLANT
DRSG AQUACEL AG SP 3.5X6 (GAUZE/BANDAGES/DRESSINGS) ×2
DRSG TEGADERM 8X12 (GAUZE/BANDAGES/DRESSINGS) ×2 IMPLANT
DURAPREP 26ML APPLICATOR (WOUND CARE) ×2 IMPLANT
ELECT BLADE TIP CTD 4 INCH (ELECTRODE) ×2 IMPLANT
ELECT PENCIL ROCKER SW 15FT (MISCELLANEOUS) ×2 IMPLANT
ELECT REM PT RETURN 15FT ADLT (MISCELLANEOUS) ×2 IMPLANT
FACESHIELD WRAPAROUND (MASK) ×8 IMPLANT
FACESHIELD WRAPAROUND OR TEAM (MASK) ×4 IMPLANT
GLENOID UNI REV MOD 24 +2 LAT (Joint) ×1 IMPLANT
GLENOSPHERE 36 +4 LAT/24 (Joint) ×1 IMPLANT
GLOVE BIO SURGEON STRL SZ7.5 (GLOVE) ×2 IMPLANT
GLOVE BIO SURGEON STRL SZ8 (GLOVE) ×2 IMPLANT
GLOVE SS BIOGEL STRL SZ 7 (GLOVE) ×1 IMPLANT
GLOVE SS BIOGEL STRL SZ 7.5 (GLOVE) ×1 IMPLANT
GLOVE SUPERSENSE BIOGEL SZ 7 (GLOVE) ×1
GLOVE SUPERSENSE BIOGEL SZ 7.5 (GLOVE) ×1
GOWN STRL REIN XL XLG (GOWN DISPOSABLE) ×4 IMPLANT
KIT BASIN OR (CUSTOM PROCEDURE TRAY) ×2 IMPLANT
KIT TURNOVER KIT A (KITS) IMPLANT
LINER HUMERAL 36 +3MM SM (Shoulder) ×1 IMPLANT
MANIFOLD NEPTUNE II (INSTRUMENTS) ×2 IMPLANT
NDL TAPERED W/ NITINOL LOOP (MISCELLANEOUS) ×1 IMPLANT
NEEDLE TAPERED W/ NITINOL LOOP (MISCELLANEOUS) ×2 IMPLANT
NS IRRIG 1000ML POUR BTL (IV SOLUTION) ×2 IMPLANT
PACK SHOULDER (CUSTOM PROCEDURE TRAY) ×2 IMPLANT
PAD ARMBOARD 7.5X6 YLW CONV (MISCELLANEOUS) ×2 IMPLANT
PAD COLD SHLDR WRAP-ON (PAD) ×2 IMPLANT
PIN NITINOL TARGETER 2.8 (PIN) IMPLANT
PIN SET MODULAR GLENOID SYSTEM (PIN) IMPLANT
RESTRAINT HEAD UNIVERSAL NS (MISCELLANEOUS) ×2 IMPLANT
SCREW CENTRAL MODULAR 25 (Screw) ×1 IMPLANT
SCREW PERI LOCK 5.5X16 (Screw) ×1 IMPLANT
SCREW PERI LOCK 5.5X32 (Screw) ×2 IMPLANT
SCREW PERIPHERAL 5.5X20 LOCK (Screw) ×1 IMPLANT
SLING ARM FOAM STRAP LRG (SOFTGOODS) IMPLANT
SLING ARM FOAM STRAP MED (SOFTGOODS) IMPLANT
SPONGE T-LAP 4X18 ~~LOC~~+RFID (SPONGE) ×2 IMPLANT
STEM HUMERAL MOD SZ 5 135 DEG (Stem) ×1 IMPLANT
SUCTION FRAZIER HANDLE 12FR (TUBING) ×2
SUCTION TUBE FRAZIER 12FR DISP (TUBING) ×1 IMPLANT
SUT FIBERWIRE #2 38 T-5 BLUE (SUTURE)
SUT MNCRL AB 3-0 PS2 18 (SUTURE) ×2 IMPLANT
SUT MON AB 2-0 CT1 36 (SUTURE) ×2 IMPLANT
SUT VIC AB 1 CT1 36 (SUTURE) ×2 IMPLANT
SUTURE FIBERWR #2 38 T-5 BLUE (SUTURE) IMPLANT
SUTURE TAPE 1.3 40 TPR END (SUTURE) ×2 IMPLANT
SUTURETAPE 1.3 40 TPR END (SUTURE) ×4
TOWEL OR 17X26 10 PK STRL BLUE (TOWEL DISPOSABLE) ×2 IMPLANT
TOWEL OR NON WOVEN STRL DISP B (DISPOSABLE) ×2 IMPLANT
TUBE SUCTION HIGH CAP CLEAR NV (SUCTIONS) ×2 IMPLANT
WATER STERILE IRR 1000ML POUR (IV SOLUTION) ×4 IMPLANT
YANKAUER SUCT BULB TIP 10FT TU (MISCELLANEOUS) IMPLANT

## 2022-03-01 NOTE — H&P (Signed)
Kathy Duran    Chief Complaint: Left shoulder rotator cuff tear arthropathy HPI: The patient is a 76 y.o. female with chronic and progressively increasing left shoulder pain related to severe rotator cuff tear arthropathy.  Due to her increasing functional limitations and failure to respond to prolonged attempts at conservative management, she is brought to the operating room at this time for planned left shoulder reverse arthroplasty  Past Medical History:  Diagnosis Date   Anxiety    Arthritis    left hip   Asthma    Inhalers for tx   Bilateral primary osteoarthritis of knee 07/06/2016   Moderate   Bilateral swelling of feet    bilateral ankle and feet edema   Bruises easily    Complication of anesthesia    sometimes needs oxygen when waking uo due to asthma   DDD (degenerative disc disease), lumbar 07/06/2016   Fibromyalgia    GERD (gastroesophageal reflux disease)    Hypertension    Myofacial muscle pain 07/06/2016   Osteoarthritis of both hands 07/06/2016   Pre-diabetes    Seasonal allergies 07/06/2016   Sinus complaint    Sjogren's syndrome (HCC)    dry eyes   Tenonitis    Urinary incontinence    occ. an issue wears pads      Past Surgical History:  Procedure Laterality Date   ABDOMINAL HYSTERECTOMY  1986   BACK SURGERY  1985   BREAST CYST ASPIRATION     BREAST SURGERY Right    cyst drainage   CARPAL TUNNEL RELEASE Right 2001   KNEE SURGERY  1991   SHOULDER SURGERY Right    right shoulder tendon and rotator cuff repair   TOTAL HIP ARTHROPLASTY Right 2006   TOTAL HIP ARTHROPLASTY Left 02/24/2016   Procedure: LEFT TOTAL HIP ARTHROPLASTY ANTERIOR APPROACH;  Surgeon: Kathryne Hitch, MD;  Location: [facility];  Service: Orthopedics;  Laterality: Left;   TOTAL KNEE ARTHROPLASTY Left 11/16/2019   Procedure: TOTAL KNEE ARTHROPLASTY;  Surgeon: Ollen Gross, MD;  Location: [facility];  Service: Orthopedics;  Laterality: Left;     Family History  Problem  Relation Age of Onset   Arrhythmia Mother    Stroke Mother    Hyperlipidemia Mother    Diabetes Mother    Hypertension Mother    Heart attack Father    Diabetes Father    Cancer Father    Hypertension Father    Hyperlipidemia Maternal Grandmother    Hypertension Maternal Grandmother    Heart attack Paternal Grandfather    Stroke Paternal Grandfather    Lung disease Brother    Diabetes Sister    Dementia Sister    Allergies Daughter    Healthy Daughter     Social History:  reports that she quit smoking about 38 years ago. Her smoking use included cigarettes. She has a 1.50 pack-year smoking history. She has never used smokeless tobacco. She reports that she does not drink alcohol and does not use drugs.  BMI: Estimated body mass index is 34.61 kg/m as calculated from the following:   Height as of this encounter: 5\' 5"  (1.651 m).   Weight as of this encounter: 94.3 kg.  Lab Results  Component Value Date   ALBUMIN 4.2 11/06/2019   Diabetes: Patient does not have a diagnosis of diabetes. Lab Results  Component Value Date   HGBA1C 5.4 02/19/2022     Smoking Status: Social History   Tobacco Use  Smoking Status Former  Packs/day: 0.10   Years: 15.00   Total pack years: 1.50   Types: Cigarettes   Quit date: 11/21/1983   Years since quitting: 38.3  Smokeless Tobacco Never   The patient is not currently a tobacco user. Counseling given: Not Answered      Medications Prior to Admission  Medication Sig Dispense Refill   acetaminophen (TYLENOL) 650 MG CR tablet Take 650 mg by mouth every morning.     ADVAIR DISKUS 250-50 MCG/ACT AEPB Inhale 1 puff into the lungs 2 (two) times daily.     albuterol (PROVENTIL) (2.5 MG/3ML) 0.083% nebulizer solution Take 2.5 mg by nebulization every 4 (four) hours as needed for wheezing or shortness of breath.     albuterol (VENTOLIN HFA) 108 (90 Base) MCG/ACT inhaler Inhale 1 puff into the lungs every 4 (four) hours as needed for  wheezing or shortness of breath.     atorvastatin (LIPITOR) 20 MG tablet Take 20 mg by mouth daily.     bumetanide (BUMEX) 0.5 MG tablet TAKE 1 TABLET BY MOUTH EVERY DAY 90 tablet 3   cetirizine (ZYRTEC) 10 MG tablet Take 10 mg by mouth daily as needed for allergies.     estradiol (VIVELLE-DOT) 0.1 MG/24HR patch Place 1 patch onto the skin 2 (two) times a week. Sundays & Wednesdays.     gabapentin (NEURONTIN) 300 MG capsule Take 300 mg by mouth at bedtime as needed (pain).     losartan (COZAAR) 25 MG tablet Take 25 mg by mouth daily.     Multiple Vitamin (MULTIVITAMIN WITH MINERALS) TABS tablet Take 1 tablet by mouth daily.     pantoprazole (PROTONIX) 40 MG tablet Take 1 tablet (40 mg total) by mouth daily. (Patient taking differently: Take 40 mg by mouth daily as needed (acid reflux).) 30 tablet 2   RESTASIS 0.05 % ophthalmic emulsion Place 1 drop into both eyes 2 (two) times daily.     traMADol (ULTRAM) 50 MG tablet Take 50 mg by mouth every 6 (six) hours as needed for severe pain.     YUVAFEM 10 MCG TABS vaginal tablet Place 10 mcg vaginally 2 (two) times a week. Tuesdays & Thursdays.       Physical Exam: Left shoulder demonstrates painful and guarded motion is noted at recent office visits.  She has globally decreased strength.  Otherwise is neurovascularly intact in the left upper extremity examination otherwise is documented at recent office visits.  Radiographs confirm changes consistent with chronic rotator cuff tear arthropathy  Vitals  Temp:  [97.5 F (36.4 C)] 97.5 F (36.4 C) (08/10 0536) Pulse Rate:  [92] 92 (08/10 0536) Resp:  [16] 16 (08/10 0536) BP: (161-192)/(91-100) 161/91 (08/10 0551) SpO2:  [99 %] 99 % (08/10 0536) Weight:  [94.3 kg] 94.3 kg (08/10 0541)  Assessment/Plan  Impression: Left shoulder rotator cuff tear arthropathy  Plan of Action: Procedure(s): REVERSE SHOULDER ARTHROPLASTY  Anoop Hemmer M Jamaiya Tunnell 03/01/2022, 5:57 AM Contact # [phone]

## 2022-03-01 NOTE — Transfer of Care (Signed)
Immediate Anesthesia Transfer of Care Note  Patient: SHAWNITA KRIZEK  Procedure(s) Performed: REVERSE SHOULDER ARTHROPLASTY (Left: Shoulder)  Patient Location: PACU  Anesthesia Type:General  Level of Consciousness: awake, oriented, drowsy and patient cooperative  Airway & Oxygen Therapy: Patient connected to face mask oxygen  Post-op Assessment: Report given to RN and Post -op Vital signs reviewed and stable  Post vital signs: stable  Last Vitals:  Vitals Value Taken Time  BP 147/83 03/01/22 0931  Temp    Pulse 73 03/01/22 0933  Resp 17 03/01/22 0933  SpO2 95 % 03/01/22 0933  Vitals shown include unvalidated device data.  Last Pain:  Vitals:   03/01/22 0541  TempSrc:   PainSc: 2       Patients Stated Pain Goal: 3 (03/01/22 0541)  Complications:  Encounter Notable Events  Notable Event Outcome Phase Comment  Difficult to intubate - unexpected  Intraprocedure Filed from anesthesia note documentation.

## 2022-03-01 NOTE — Care Plan (Signed)
Ortho Bundle Case Management Note  Patient Details  Name: TABITHA RIGGINS MRN: [id number] Date of Birth: [birth year]  L Rev TSA on 03-01-22 DCP:  Home with husband and dtr DME:  No needs PT:  [facility]                   DME Arranged:  N/A DME Agency:  NA  HH Arranged:  NA HH Agency:  NA  Additional Comments: Please contact me with any questions of if this plan should need to change.  Ennis Forts, RN,CCM [facility]  [phone] 03/01/2022, 10:56 AM

## 2022-03-01 NOTE — Anesthesia Procedure Notes (Signed)
Procedure Name: Intubation Date/Time: 03/01/2022 7:39 AM  Performed by: Pilar Grammes, CRNAPre-anesthesia Checklist: Patient identified, Emergency Drugs available, Suction available, Patient being monitored and Timeout performed Patient Re-evaluated:Patient Re-evaluated prior to induction Oxygen Delivery Method: Circle system utilized Preoxygenation: Pre-oxygenation with 100% oxygen Induction Type: IV induction Ventilation: Mask ventilation without difficulty Laryngoscope Size: Mac and 3 Grade View: Grade III Tube type: Oral Tube size: 7.0 mm Number of attempts: 3 Placement Confirmation: positive ETCO2, ETT inserted through vocal cords under direct vision, CO2 detector and breath sounds checked- equal and bilateral Secured at: 22 cm Tube secured with: Tape Dental Injury: Teeth and Oropharynx as per pre-operative assessment  Difficulty Due To: Difficulty was unanticipated and Difficult Airway- due to reduced neck mobility Comments: Easy mask airway. 3 attempts w Sabra Heck 2, Mac 3 x 2 attempts.   Grade 3 view in patient w limited extension, but also had shoulder head frame and large hair bun in back of head that made extension even more difficult. No desats.... easy mask ventilation between attempts.

## 2022-03-01 NOTE — Anesthesia Procedure Notes (Signed)
Anesthesia Regional Block: Interscalene brachial plexus block   Pre-Anesthetic Checklist: , timeout performed,  Correct Patient, Correct Site, Correct Laterality,  Correct Procedure, Correct Position, site marked,  Risks and benefits discussed,  Surgical consent,  Pre-op evaluation,  At surgeon's request and post-op pain management  Laterality: Left and Upper  Prep: chloraprep       Needles:  Injection technique: Single-shot  Needle Type: Echogenic Needle     Needle Length: 9cm  Needle Gauge: 21     Additional Needles:   Procedures:,,,, ultrasound used (permanent image in chart),,    Narrative:  Start time: 03/01/2022 6:54 AM End time: 03/01/2022 7:01 AM Injection made incrementally with aspirations every 5 mL.  Performed by: Personally  Anesthesiologist: Jairo Ben, MD  Additional Notes: Pt identified in Holding room.  Monitors applied. Working IV access confirmed. Sterile prep L clavicle and neck.  #21ga ECHOgenic Arrow block needle to interscalene brachial plexus with US guidance.  10cc 0.5% Bupivacaine 1:200k epi, Exparel injected incrementally after negative test dose.  Patient asymptomatic, VSS, no heme aspirated, tolerated well.   Sandford Craze, MD

## 2022-03-01 NOTE — Op Note (Signed)
03/01/2022  9:29 AM  PATIENT:   Kathy Duran  76 y.o. female  PRE-OPERATIVE DIAGNOSIS:  Left shoulder rotator cuff tear arthropathy  POST-OPERATIVE DIAGNOSIS: Same  PROCEDURE: Left shoulder reverse arthroplasty utilizing a size 5.5 press-fit Arthrex stem with a neutral metaphysis, +3 polyethylene insert, 36/+4 glenosphere and a small/+2 baseplate  SURGEON:  Lyrick Worland, Vania Rea M.D.  ASSISTANTS: Ralene Bathe, PA-C  ANESTHESIA:   General endotracheal and interscalene block with Exparel  EBL: 150cc  SPECIMEN: None  Drains: None   PATIENT DISPOSITION:  PACU - hemodynamically stable.    PLAN OF CARE: Discharge to home after PACU  Brief history:   Patient is a 76 year old female who has had chronic and progressively increasing left shoulder pain related to severe underlying arthritis with bony deformity as well as profound rotator cuff dysfunction with diffuse high-grade tearing of the entire superior rotator cuff.  Due to her increasing functional limitations and failure to respond to prolonged attempts at conservative management, she is brought to the operating room at this time for planned left shoulder reverse arthroplasty.  Preoperatively, I counseled the patient regarding treatment options and risks versus benefits thereof.  Possible surgical complications were all reviewed including potential for bleeding, infection, neurovascular injury, persistent pain, loss of motion, anesthetic complication, failure of the implant, and possible need for additional surgery. They understand and accept and agrees with our planned procedure.   Procedure in detail:  After undergoing routine preop evaluation the patient received prophylactic antibiotics and interscalene block with Exparel was established in the holding area by the anesthesia department.  Patient was subsequently placed supine on the operating table and underwent the smooth induction of general endotracheal anesthesia.  Placed  into the beachchair position and appropriately padded and protected.  The left shoulder girdle region was sterilely prepped and draped in standard fashion.  Timeout was called.  A deltopectoral approach left shoulder was made to an approximately 10 cm incision.  Skin flaps were elevated dissection carried deeply and the deltopectoral interval was identified although there was no dominant vein or defined interval and so we dissected from the level the coracoid process distally along the intermuscular plane.  Adhesions were then divided beneath the deltoid and the conjoined tendon was mobilized and retracted medially and the upper centimeter the pectoralis major was tenotomized for exposure.  The long head biceps tendon was then tenodesed to the upper border the pectoralis major tendon and the proximal segment was unroofed and excised.  The superior rotator cuff was then split from the apex of the bicipital groove to the base of the coracoid and the subscapularis was then separated from the lesser tuberosity using electrocautery and the free margin was tagged with a pair of suture tape sutures.  Capsular attachments were then divided from the anterior and inferior margins of the humeral neck and humeral head was then delivered through the wound.  An extra medullary guide was then used to outline the proposed humeral head resection which was performed with an oscillating saw at approximate 20 degrees retroversion.  Marginal osteophytes were removed with rondure and a metal cap was then placed over the cut proximal humeral surface.  We then exposed the glenoid with appropriate retractors and performed a circumferential labral resection and then directed a guidepin into the center of the glenoid and the glenoid was then reamed with a central followed by peripheral reamer to a stable subchondral bony bed.  Preparation was then completed with central drill and tapped for a  25 mm lag screw.  Our baseplate was then assembled  and was then inserted with vancomycin powder applied to the threads of the lag screw and excellent purchase and fixation was achieved.  The peripheral locking screws were all then placed using standard technique with excellent fixation.  A 36/+4 glenosphere was then impacted onto the baseplate and the central locking screw was placed.  We then returned our attention back to the proximal humerus where the canal was opened and we broached up to initially size 6 but were not able to achieve appropriate soft tissue balance so downsized to a high 5.5 and seated approach more deeply prepare the metaphysis and at this point a trial showed good motion good stability good soft tissue balance.  This point a trial implant was then removed.  Our final implant was assembled.  Vancomycin powder was then sprayed into the mall and the final implant was then seated with excellent fit and fixation.  Trial reduction at this point showed excellent motion stability and soft tissue balance with a +3 poly-.  The trial poly was then removed.  The final poly was then impacted after the implant was cleaned and dried.  Final reduction showed good motion good stability good soft tissue balance all much to our satisfaction.  The wound was then copiously irrigated.  Final hemostasis was obtained.  The subscapularis was then confirmed to have good elasticity and was repaired back to the eyelets on the collar the implant.  The deltopectoral interval was then reapproximated with a series of figure-of-eight and 1 Vicryl sutures.  2-0 Monocryl used to close the subcu layer and intracuticular 3 Monocryl for the skin followed by Dermabond and Aquacel dressing.  Left arm placed into a sling and the patient was awakened, extubated, and taken to the recovery room in stable condition.  Ralene Bathe, PA-C was utilized as an Geophysicist/field seismologist throughout this case, essential for help with positioning the patient, positioning extremity, tissue manipulation,  implantation of the prosthesis, suture management, wound closure, and intraoperative decision-making.  Senaida Lange MD  Contact # [phone]

## 2022-03-01 NOTE — Plan of Care (Signed)
  Problem: Education: Goal: Knowledge of General Education information will improve Description: Including pain rating scale, medication(s)/side effects and non-pharmacologic comfort measures Outcome: Progressing   Problem: Nutrition: Goal: Adequate nutrition will be maintained Outcome: Adequate for Discharge   Problem: Elimination: Goal: Will not experience complications related to urinary retention Outcome: Adequate for Discharge   Problem: Pain Managment: Goal: General experience of comfort will improve Outcome: Progressing   

## 2022-03-01 NOTE — Anesthesia Postprocedure Evaluation (Signed)
Anesthesia Post Note  Patient: Kathy Duran  Procedure(s) Performed: REVERSE SHOULDER ARTHROPLASTY (Left: Shoulder)     Patient location during evaluation: PACU Anesthesia Type: General Level of consciousness: awake and alert, patient cooperative and oriented Pain management: pain level controlled Vital Signs Assessment: post-procedure vital signs reviewed and stable Respiratory status: spontaneous breathing, nonlabored ventilation and respiratory function stable Cardiovascular status: blood pressure returned to baseline and stable Postop Assessment: no apparent nausea or vomiting Anesthetic complications: yes   Encounter Notable Events  Notable Event Outcome Phase Comment  Difficult to intubate - unexpected  Intraprocedure Filed from anesthesia note documentation.    Last Vitals:  Vitals:   03/01/22 1030 03/01/22 1045  BP:  (!) 154/87  Pulse:  68  Resp:  15  Temp:    SpO2: 94% 92%    Last Pain:  Vitals:   03/01/22 1045  TempSrc:   PainSc: 0-No pain                 Daya Dutt,E. Amera Banos

## 2022-03-01 NOTE — Discharge Instructions (Signed)

## 2022-03-02 ENCOUNTER — Encounter (HOSPITAL_COMMUNITY): Payer: Self-pay | Admitting: Orthopedic Surgery

## 2022-03-02 DIAGNOSIS — I10 Essential (primary) hypertension: Secondary | ICD-10-CM | POA: Diagnosis not present

## 2022-03-02 DIAGNOSIS — K219 Gastro-esophageal reflux disease without esophagitis: Secondary | ICD-10-CM | POA: Diagnosis not present

## 2022-03-02 DIAGNOSIS — J449 Chronic obstructive pulmonary disease, unspecified: Secondary | ICD-10-CM | POA: Diagnosis not present

## 2022-03-02 DIAGNOSIS — Z6834 Body mass index (BMI) 34.0-34.9, adult: Secondary | ICD-10-CM | POA: Diagnosis not present

## 2022-03-02 DIAGNOSIS — Z79899 Other long term (current) drug therapy: Secondary | ICD-10-CM | POA: Diagnosis not present

## 2022-03-02 DIAGNOSIS — M3500 Sicca syndrome, unspecified: Secondary | ICD-10-CM | POA: Diagnosis not present

## 2022-03-02 DIAGNOSIS — M75102 Unspecified rotator cuff tear or rupture of left shoulder, not specified as traumatic: Secondary | ICD-10-CM | POA: Diagnosis not present

## 2022-03-02 DIAGNOSIS — Z87891 Personal history of nicotine dependence: Secondary | ICD-10-CM | POA: Diagnosis not present

## 2022-03-02 NOTE — Discharge Summary (Signed)
PATIENT ID:      Kathy Duran  MRN:     [id number] DOB/AGE:    Nov 03, 1945 / 76 y.o.     DISCHARGE SUMMARY  ADMISSION DATE:    03/01/2022 DISCHARGE DATE:  03/02/22  ADMISSION DIAGNOSIS: Left shoulder rotator cuff tear arthropathy Past Medical History:  Diagnosis Date   Anxiety    Arthritis    left hip   Asthma    Inhalers for tx   Bilateral primary osteoarthritis of knee 07/06/2016   Moderate   Bilateral swelling of feet    bilateral ankle and feet edema   Bruises easily    Complication of anesthesia    sometimes needs oxygen when waking uo due to asthma   DDD (degenerative disc disease), lumbar 07/06/2016   Fibromyalgia    GERD (gastroesophageal reflux disease)    Hypertension    Myofacial muscle pain 07/06/2016   Osteoarthritis of both hands 07/06/2016   Pre-diabetes    Seasonal allergies 07/06/2016   Sinus complaint    Sjogren's syndrome (HCC)    dry eyes   Tenonitis    Urinary incontinence    occ. an issue wears pads    DISCHARGE DIAGNOSIS:   Principal Problem:   S/P reverse total shoulder arthroplasty, left   PROCEDURE: Procedure(s): REVERSE SHOULDER ARTHROPLASTY on 03/01/2022  CONSULTS:    HISTORY:  See H&P in chart.  HOSPITAL COURSE:  Kathy Duran is a 76 y.o. admitted on 03/01/2022 with a diagnosis of Left shoulder rotator cuff tear arthropathy.  They were brought to the operating room on 03/01/2022 and underwent Procedure(s): REVERSE SHOULDER ARTHROPLASTY.    They were given perioperative antibiotics:  Anti-infectives (From admission, onward)    Start     Dose/Rate Route Frequency Ordered Stop   03/01/22 0845  vancomycin (VANCOCIN) powder  Status:  Discontinued          As needed 03/01/22 0909 03/01/22 1058   03/01/22 0600  ceFAZolin (ANCEF) IVPB 2g/100 mL premix        2 g 200 mL/hr over 30 Minutes Intravenous On call to O.R. 03/01/22 8527 03/01/22 0801     .  Patient underwent the above named procedure and tolerated it well. The following day  they were hemodynamically stable and pain was controlled on oral analgesics. They were neurovascularly intact to the operative extremity. OT was ordered and worked with patient per protocol. They were medically and orthopaedically stable for discharge on .    DIAGNOSTIC STUDIES:  RECENT RADIOGRAPHIC STUDIES :  No results found.  RECENT VITAL SIGNS:  Patient Vitals for the past 24 hrs:  BP Temp Temp src Pulse Resp SpO2  03/02/22 0550 (!) 127/59 97.8 F (36.6 C) -- 90 17 96 %  03/02/22 0149 133/68 99.9 F (37.7 C) Oral 82 17 94 %  03/01/22 2114 (!) 148/67 98.7 F (37.1 C) -- 87 17 99 %  03/01/22 1735 (!) 160/86 98.2 F (36.8 C) Oral 71 16 97 %  03/01/22 1310 (!) 160/78 97.8 F (36.6 C) Oral 73 16 --  03/01/22 1110 (!) 153/81 98 F (36.7 C) Oral 68 16 96 %  03/01/22 1045 (!) 154/87 -- -- 68 15 92 %  03/01/22 1030 -- -- -- -- -- 94 %  03/01/22 1015 (!) 152/87 -- -- 65 14 92 %  03/01/22 1000 (!) 147/107 -- -- 66 15 93 %  03/01/22 0945 (!) 141/91 (!) 97.5 F (36.4 C) -- 68 12 93 %  03/01/22 0930 (!)  147/83 (!) 97.4 F (36.3 C) -- -- 13 95 %  .  RECENT EKG RESULTS:    Orders placed or performed in visit on 10/13/21   EKG 12-Lead    DISCHARGE INSTRUCTIONS:    DISCHARGE MEDICATIONS:   Allergies as of 03/02/2022       Reactions   Clavulanic Acid    Losartan Potassium-hctz Other (See Comments)   HEADACHES   Augmentin [amoxicillin-pot Clavulanate] Rash   Diclofenac-misoprostol Itching   Arthrotec   Latex Itching   Naproxen Nausea Only        Medication List     TAKE these medications    acetaminophen 650 MG CR tablet Commonly known as: TYLENOL Take 650 mg by mouth every morning.   Advair Diskus 250-50 MCG/ACT Aepb Generic drug: fluticasone-salmeterol Inhale 1 puff into the lungs 2 (two) times daily.   albuterol (2.5 MG/3ML) 0.083% nebulizer solution Commonly known as: PROVENTIL Take 2.5 mg by nebulization every 4 (four) hours as needed for wheezing or  shortness of breath.   albuterol 108 (90 Base) MCG/ACT inhaler Commonly known as: VENTOLIN HFA Inhale 1 puff into the lungs every 4 (four) hours as needed for wheezing or shortness of breath.   atorvastatin 20 MG tablet Commonly known as: LIPITOR Take 20 mg by mouth daily.   bumetanide 0.5 MG tablet Commonly known as: BUMEX TAKE 1 TABLET BY MOUTH EVERY DAY   cetirizine 10 MG tablet Commonly known as: ZYRTEC Take 10 mg by mouth daily as needed for allergies.   cyclobenzaprine 10 MG tablet Commonly known as: FLEXERIL Take 1 tablet (10 mg total) by mouth 3 (three) times daily as needed for muscle spasms.   estradiol 0.1 MG/24HR patch Commonly known as: VIVELLE-DOT Place 1 patch onto the skin 2 (two) times a week. Sundays & Wednesdays.   gabapentin 300 MG capsule Commonly known as: NEURONTIN Take 300 mg by mouth at bedtime as needed (pain).   losartan 25 MG tablet Commonly known as: COZAAR Take 25 mg by mouth daily.   multivitamin with minerals Tabs tablet Take 1 tablet by mouth daily.   ondansetron 4 MG tablet Commonly known as: Zofran Take 1 tablet (4 mg total) by mouth every 8 (eight) hours as needed for nausea or vomiting.   oxyCODONE-acetaminophen 5-325 MG tablet Commonly known as: Percocet Take 1 tablet by mouth every 4 (four) hours as needed for severe pain (max 6 q).   pantoprazole 40 MG tablet Commonly known as: PROTONIX Take 1 tablet (40 mg total) by mouth daily. What changed:  when to take this reasons to take this   Restasis 0.05 % ophthalmic emulsion Generic drug: cycloSPORINE Place 1 drop into both eyes 2 (two) times daily.   traMADol 50 MG tablet Commonly known as: ULTRAM Take 1 tablet (50 mg total) by mouth every 6 (six) hours as needed for severe pain or moderate pain. What changed: reasons to take this   Yuvafem 10 MCG Tabs vaginal tablet Generic drug: Estradiol Place 10 mcg vaginally 2 (two) times a week. Tuesdays & Thursdays.         FOLLOW UP VISIT:    Follow-up Information     Kathy Hanly, MD Follow up.   Specialty: Orthopedic Surgery Why: 03-12-2022 at 10:15 AM for -post-op Contact information: [address] [place] [address] [phone]                 DISCHARGE TO: home    DISCHARGE CONDITION:  Good  French Ana Tanveer Brammer for Dr. Francena Duran 03/02/2022, 7:48 AM

## 2022-03-02 NOTE — Plan of Care (Signed)
  Problem: Education: Goal: Knowledge of General Education information will improve Description: Including pain rating scale, medication(s)/side effects and non-pharmacologic comfort measures Outcome: Progressing   Problem: Activity: Goal: Risk for activity intolerance will decrease Outcome: Progressing   Problem: Pain Managment: Goal: General experience of comfort will improve Outcome: Progressing   

## 2022-03-02 NOTE — Progress Notes (Signed)
Orthopedic Tech Progress Note Patient Details:  Kathy Duran 12-24-1945 [id number]  Ortho Devices Type of Ortho Device: Arm sling Ortho Device/Splint Location: LUE Ortho Device/Splint Interventions: Application   Post Interventions Patient Tolerated: Well  Genelle Bal [name] 03/02/2022, 9:59 AM

## 2022-03-02 NOTE — Evaluation (Signed)
Occupational Therapy Evaluation Patient Details Name: Kathy Duran MRN: [id number] DOB: March 12, 1946 Today's Date: 03/02/2022   History of Present Illness s/p L reverse shoulder Arthroplasty; PMH: bil THA(posterior 2016), L TKA,  R RCR, back surgery, OA   Clinical Impression   Kathy Duran is a 76 year old woman s/p shoulder replacement without functional use of left non- dominant upper extremity secondary to effects of surgery and interscalene block and shoulder precautions. Therapist provided education and instruction to patient and spouse in regards to exercises, precautions, positioning, donning upper extremity clothing and bathing while maintaining shoulder precautions, ice and edema management and donning/doffing sling. Patient and spouse verbalized understanding and demonstrated as needed. Bulk of education was to patient with spouse arriving near end of session. Patient's sling too small. Request larger sling prior to discharge. Patient to follow up with MD for further therapy needs.        Recommendations for follow up therapy are one component of a multi-disciplinary discharge planning process, led by the attending physician.  Recommendations may be updated based on patient status, additional functional criteria and insurance authorization.   Follow Up Recommendations  Follow physician's recommendations for discharge plan and follow up therapies    Assistance Recommended at Discharge Intermittent Supervision/Assistance  Patient can return home with the following A little help with bathing/dressing/bathroom;Assistance with cooking/housework    Functional Status Assessment  Patient has had a recent decline in their functional status and demonstrates the ability to make significant improvements in function in a reasonable and predictable amount of time.  Equipment Recommendations  None recommended by OT    Recommendations for Other Services       Precautions / Restrictions  Precautions Precautions: Shoulder Type of Shoulder Precautions: If sitting in controlled environment, ok to come out of sling to give neck a break. Please sleep in it to protect until follow up in office.     OK to use operative arm for feeding, hygiene and ADLs.   Ok to instruct Pendulums and lap slides as exercises. Ok to use operative arm within the following parameters for ADL purposes     New ROM (1/09)   Ok for PROM, AAROM, AROM within pain tolerance and within the following ROM   ER 20   ABD 45   FE 60 Shoulder Interventions: Shoulder sling/immobilizer;Off for dressing/bathing/exercises Precaution Booklet Issued: Yes (comment) Required Braces or Orthoses: Sling Restrictions Weight Bearing Restrictions: Yes LUE Weight Bearing: Non weight bearing      Mobility Bed Mobility Overal bed mobility: Needs Assistance Bed Mobility: Supine to Sit     Supine to sit: Supervision          Transfers Overall transfer level: Modified independent                        Balance Overall balance assessment: No apparent balance deficits (not formally assessed)                                         ADL either performed or assessed with clinical judgement   ADL Overall ADL's : Needs assistance/impaired Eating/Feeding: Set up   Grooming: Modified independent   Upper Body Bathing: Minimal assistance   Lower Body Bathing: Minimal assistance   Upper Body Dressing : Moderate assistance   Lower Body Dressing: Minimal assistance   Toilet Transfer: Supervision/safety   Toileting-  Clothing Manipulation and Hygiene: Minimal assistance       Functional mobility during ADLs: Supervision/safety       Vision Baseline Vision/History: 1 Wears glasses       Perception     Praxis      Pertinent Vitals/Pain Pain Assessment Pain Assessment: 0-10 Pain Score: 4  Pain Location: L shoulder Pain Descriptors / Indicators: Aching, Guarding, Grimacing Pain  Intervention(s): Monitored during session     Hand Dominance Right   Extremity/Trunk Assessment Upper Extremity Assessment Upper Extremity Assessment: RUE deficits/detail;LUE deficits/detail RUE Deficits / Details: WFL ROM and strength RUE Sensation: WNL RUE Coordination: WNL LUE Deficits / Details: impaired secondary to effects of surgery and block - though able to grossly move elbow, forearm, wrist and fingrs LUE Sensation: WNL LUE Coordination: WNL   Lower Extremity Assessment Lower Extremity Assessment: Overall WFL for tasks assessed   Cervical / Trunk Assessment Cervical / Trunk Assessment: Normal   Communication Communication Communication: No difficulties   Cognition Arousal/Alertness: Awake/alert Behavior During Therapy: WFL for tasks assessed/performed Overall Cognitive Status: Within Functional Limits for tasks assessed                                       General Comments       Exercises     Shoulder Instructions Shoulder Instructions Donning/doffing shirt without moving shoulder: Patient able to independently direct caregiver Method for sponge bathing under operated UE: Independent Donning/doffing sling/immobilizer: Patient able to independently direct caregiver Correct positioning of sling/immobilizer: Patient able to independently direct caregiver Pendulum exercises (written home exercise program): Patient able to independently direct caregiver ROM for elbow, wrist and digits of operated UE: Independent Sling wearing schedule (on at all times/off for ADL's): Independent Proper positioning of operated UE when showering: Independent Dressing change: Independent Positioning of UE while sleeping: Independent    Home Living Family/patient expects to be discharged to:: Private residence Living Arrangements: Spouse/significant other Available Help at Discharge: Family;Available 24 hours/day Type of Home: House Home Access: Stairs to  enter [employer] of Steps: 2 Entrance Stairs-Rails: Can reach both;Left;Right Home Layout: One level     Bathroom Shower/Tub: Tub/shower unit;Walk-in shower   Bathroom Toilet: Standard     Home Equipment: Cane - single Librarian, academic (2 wheels);BSC/3in1;Tub bench          Prior Functioning/Environment Prior Level of Function : Independent/Modified Independent                        OT Problem List: Decreased strength;Decreased range of motion;Impaired UE functional use;Pain      OT Treatment/Interventions:      OT Goals(Current goals can be found in the care plan section) Acute Rehab OT Goals OT Goal Formulation: All assessment and education complete, DC therapy  OT Frequency:      Co-evaluation              AM-PAC OT "6 Clicks" Daily Activity     Outcome Measure Help from another person eating meals?: A Little Help from another person taking care of personal grooming?: None Help from another person toileting, which includes using toliet, bedpan, or urinal?: A Little Help from another person bathing (including washing, rinsing, drying)?: A Little Help from another person to put on and taking off regular upper body clothing?: A Lot Help from another person to put on and taking off regular lower  body clothing?: A Little 6 Click Score: 18   End of Session Nurse Communication:  (OT education complete, needs larger sling before discharge)  Activity Tolerance: Patient tolerated treatment well Patient left: in chair;with call bell/phone within reach;with family/visitor present  OT Visit Diagnosis: Muscle weakness (generalized) (M62.81);Pain                Time: 2426-8341 OT Time Calculation (min): 45 min Charges:  OT General Charges $OT Visit: 1 Visit OT Evaluation $OT Eval Low Complexity: 1 Low OT Treatments $Self Care/Home Management : 23-37 mins  Gillie Fleites, OTR/L Acute Care Rehab Services  Office [phone] Pager: [phone]    Kelli Churn 03/02/2022, 9:38 AM

## 2022-03-02 NOTE — Progress Notes (Signed)
  Transition of Care [facility]) Screening Note   Patient Details  Name: Kathy Duran Date of Birth: 1946/02/28   Transition of [facility]) CM/SW Contact:    Amada Jupiter, LCSW Phone Number: 03/02/2022, 10:06 AM    Transition of Care Department [facility]) has reviewed patient and no TOC needs have been identified at this time. We will continue to monitor patient advancement through interdisciplinary progression rounds. If new patient transition needs arise, please place a TOC consult.

## 2022-03-14 DIAGNOSIS — Z471 Aftercare following joint replacement surgery: Secondary | ICD-10-CM | POA: Diagnosis not present

## 2022-03-14 DIAGNOSIS — M25512 Pain in left shoulder: Secondary | ICD-10-CM | POA: Diagnosis not present

## 2022-03-14 DIAGNOSIS — M542 Cervicalgia: Secondary | ICD-10-CM | POA: Diagnosis not present

## 2022-03-14 DIAGNOSIS — Z96612 Presence of left artificial shoulder joint: Secondary | ICD-10-CM | POA: Diagnosis not present

## 2022-03-18 DIAGNOSIS — M542 Cervicalgia: Secondary | ICD-10-CM | POA: Diagnosis not present

## 2022-03-23 DIAGNOSIS — G5602 Carpal tunnel syndrome, left upper limb: Secondary | ICD-10-CM | POA: Diagnosis not present

## 2022-03-29 DIAGNOSIS — M25512 Pain in left shoulder: Secondary | ICD-10-CM | POA: Diagnosis not present

## 2022-04-03 DIAGNOSIS — M25512 Pain in left shoulder: Secondary | ICD-10-CM | POA: Diagnosis not present

## 2022-04-10 DIAGNOSIS — M25512 Pain in left shoulder: Secondary | ICD-10-CM | POA: Diagnosis not present

## 2022-04-18 DIAGNOSIS — M25512 Pain in left shoulder: Secondary | ICD-10-CM | POA: Diagnosis not present

## 2022-04-20 DIAGNOSIS — M5412 Radiculopathy, cervical region: Secondary | ICD-10-CM | POA: Diagnosis not present

## 2022-04-24 DIAGNOSIS — M25512 Pain in left shoulder: Secondary | ICD-10-CM | POA: Diagnosis not present

## 2022-04-24 DIAGNOSIS — M79645 Pain in left finger(s): Secondary | ICD-10-CM | POA: Diagnosis not present

## 2022-04-24 DIAGNOSIS — M25511 Pain in right shoulder: Secondary | ICD-10-CM | POA: Diagnosis not present

## 2022-04-24 DIAGNOSIS — M542 Cervicalgia: Secondary | ICD-10-CM | POA: Diagnosis not present

## 2022-04-25 DIAGNOSIS — M25512 Pain in left shoulder: Secondary | ICD-10-CM | POA: Diagnosis not present

## 2022-05-02 DIAGNOSIS — M25512 Pain in left shoulder: Secondary | ICD-10-CM | POA: Diagnosis not present

## 2022-05-03 DIAGNOSIS — G540 Brachial plexus disorders: Secondary | ICD-10-CM | POA: Diagnosis not present

## 2022-05-07 DIAGNOSIS — G540 Brachial plexus disorders: Secondary | ICD-10-CM | POA: Diagnosis not present

## 2022-05-07 DIAGNOSIS — M79645 Pain in left finger(s): Secondary | ICD-10-CM | POA: Diagnosis not present

## 2022-05-08 DIAGNOSIS — G540 Brachial plexus disorders: Secondary | ICD-10-CM | POA: Diagnosis not present

## 2022-05-16 DIAGNOSIS — M25512 Pain in left shoulder: Secondary | ICD-10-CM | POA: Diagnosis not present

## 2022-05-23 DIAGNOSIS — M25512 Pain in left shoulder: Secondary | ICD-10-CM | POA: Diagnosis not present

## 2022-05-25 DIAGNOSIS — M1711 Unilateral primary osteoarthritis, right knee: Secondary | ICD-10-CM | POA: Diagnosis not present

## 2022-05-29 DIAGNOSIS — N390 Urinary tract infection, site not specified: Secondary | ICD-10-CM | POA: Diagnosis not present

## 2022-06-07 DIAGNOSIS — Z96612 Presence of left artificial shoulder joint: Secondary | ICD-10-CM | POA: Diagnosis not present

## 2022-06-25 DIAGNOSIS — M25512 Pain in left shoulder: Secondary | ICD-10-CM | POA: Diagnosis not present

## 2022-06-28 DIAGNOSIS — S143XXA Injury of brachial plexus, initial encounter: Secondary | ICD-10-CM | POA: Diagnosis not present

## 2022-06-28 DIAGNOSIS — M67911 Unspecified disorder of synovium and tendon, right shoulder: Secondary | ICD-10-CM | POA: Diagnosis not present

## 2022-06-28 DIAGNOSIS — X58XXXA Exposure to other specified factors, initial encounter: Secondary | ICD-10-CM | POA: Diagnosis not present

## 2022-06-28 DIAGNOSIS — G540 Brachial plexus disorders: Secondary | ICD-10-CM | POA: Diagnosis not present

## 2022-07-02 DIAGNOSIS — M25512 Pain in left shoulder: Secondary | ICD-10-CM | POA: Diagnosis not present

## 2022-07-03 DIAGNOSIS — S143XXA Injury of brachial plexus, initial encounter: Secondary | ICD-10-CM | POA: Diagnosis not present

## 2022-07-03 DIAGNOSIS — J45909 Unspecified asthma, uncomplicated: Secondary | ICD-10-CM | POA: Diagnosis not present

## 2022-07-03 DIAGNOSIS — K219 Gastro-esophageal reflux disease without esophagitis: Secondary | ICD-10-CM | POA: Diagnosis not present

## 2022-07-03 DIAGNOSIS — I10 Essential (primary) hypertension: Secondary | ICD-10-CM | POA: Diagnosis not present

## 2022-07-03 DIAGNOSIS — Z23 Encounter for immunization: Secondary | ICD-10-CM | POA: Diagnosis not present

## 2022-07-03 DIAGNOSIS — R7303 Prediabetes: Secondary | ICD-10-CM | POA: Diagnosis not present

## 2022-07-03 DIAGNOSIS — E782 Mixed hyperlipidemia: Secondary | ICD-10-CM | POA: Diagnosis not present

## 2022-07-10 DIAGNOSIS — I10 Essential (primary) hypertension: Secondary | ICD-10-CM | POA: Diagnosis not present

## 2022-07-11 DIAGNOSIS — M67911 Unspecified disorder of synovium and tendon, right shoulder: Secondary | ICD-10-CM | POA: Diagnosis not present

## 2022-07-11 DIAGNOSIS — Z96612 Presence of left artificial shoulder joint: Secondary | ICD-10-CM | POA: Diagnosis not present

## 2022-07-13 ENCOUNTER — Ambulatory Visit: Payer: Medicare PPO | Admitting: Diagnostic Neuroimaging

## 2022-07-20 DIAGNOSIS — Z471 Aftercare following joint replacement surgery: Secondary | ICD-10-CM | POA: Diagnosis not present

## 2022-07-20 DIAGNOSIS — Z96612 Presence of left artificial shoulder joint: Secondary | ICD-10-CM | POA: Diagnosis not present

## 2022-07-24 ENCOUNTER — Other Ambulatory Visit: Payer: Self-pay | Admitting: Internal Medicine

## 2022-08-03 ENCOUNTER — Other Ambulatory Visit: Payer: Self-pay | Admitting: Internal Medicine

## 2022-08-07 DIAGNOSIS — M25512 Pain in left shoulder: Secondary | ICD-10-CM | POA: Diagnosis not present

## 2022-08-07 NOTE — Telephone Encounter (Signed)
Called [facility]. Bumex 0.5mg  is on backorder and there is none within 20 miles. There is also no 1mg  tablets available (for patient to be able to cut in half).   Routed to MD/PharmD team for assistance with possible change

## 2022-08-07 NOTE — Telephone Encounter (Signed)
Called patient to discuss medication. She reports PCP changed her from bumex to lasix. Med list updated

## 2022-08-07 NOTE — Telephone Encounter (Signed)
Previously took furosemide, pt reported irritated scalp and was changed to bumetanide 01/2021.   Can change to torsemide instead, equivalent dose of torsemide would be 10mg  daily.

## 2022-08-17 DIAGNOSIS — M25512 Pain in left shoulder: Secondary | ICD-10-CM | POA: Diagnosis not present

## 2022-08-20 DIAGNOSIS — G5602 Carpal tunnel syndrome, left upper limb: Secondary | ICD-10-CM | POA: Diagnosis not present

## 2022-08-20 DIAGNOSIS — G540 Brachial plexus disorders: Secondary | ICD-10-CM | POA: Diagnosis not present

## 2022-08-22 DIAGNOSIS — M25512 Pain in left shoulder: Secondary | ICD-10-CM | POA: Diagnosis not present

## 2022-08-23 DIAGNOSIS — M26609 Unspecified temporomandibular joint disorder, unspecified side: Secondary | ICD-10-CM | POA: Diagnosis not present

## 2022-08-24 DIAGNOSIS — M25512 Pain in left shoulder: Secondary | ICD-10-CM | POA: Diagnosis not present

## 2022-08-29 DIAGNOSIS — M25512 Pain in left shoulder: Secondary | ICD-10-CM | POA: Diagnosis not present

## 2022-09-07 DIAGNOSIS — M25512 Pain in left shoulder: Secondary | ICD-10-CM | POA: Diagnosis not present

## 2022-09-10 DIAGNOSIS — Z96612 Presence of left artificial shoulder joint: Secondary | ICD-10-CM | POA: Diagnosis not present

## 2022-09-10 DIAGNOSIS — G540 Brachial plexus disorders: Secondary | ICD-10-CM | POA: Diagnosis not present

## 2022-09-24 DIAGNOSIS — R21 Rash and other nonspecific skin eruption: Secondary | ICD-10-CM | POA: Diagnosis not present

## 2022-09-27 DIAGNOSIS — M25512 Pain in left shoulder: Secondary | ICD-10-CM | POA: Diagnosis not present

## 2022-10-02 DIAGNOSIS — M25512 Pain in left shoulder: Secondary | ICD-10-CM | POA: Diagnosis not present

## 2022-10-03 DIAGNOSIS — M25561 Pain in right knee: Secondary | ICD-10-CM | POA: Diagnosis not present

## 2022-10-04 DIAGNOSIS — M25512 Pain in left shoulder: Secondary | ICD-10-CM | POA: Diagnosis not present

## 2022-10-08 DIAGNOSIS — M25512 Pain in left shoulder: Secondary | ICD-10-CM | POA: Diagnosis not present

## 2022-10-10 DIAGNOSIS — Z96612 Presence of left artificial shoulder joint: Secondary | ICD-10-CM | POA: Diagnosis not present

## 2022-10-10 DIAGNOSIS — S143XXA Injury of brachial plexus, initial encounter: Secondary | ICD-10-CM | POA: Diagnosis not present

## 2022-10-10 DIAGNOSIS — M67911 Unspecified disorder of synovium and tendon, right shoulder: Secondary | ICD-10-CM | POA: Diagnosis not present

## 2022-10-12 DIAGNOSIS — M25512 Pain in left shoulder: Secondary | ICD-10-CM | POA: Diagnosis not present

## 2022-10-16 DIAGNOSIS — M25512 Pain in left shoulder: Secondary | ICD-10-CM | POA: Diagnosis not present

## 2022-10-18 DIAGNOSIS — M25512 Pain in left shoulder: Secondary | ICD-10-CM | POA: Diagnosis not present

## 2022-10-22 DIAGNOSIS — M25512 Pain in left shoulder: Secondary | ICD-10-CM | POA: Diagnosis not present

## 2022-10-24 DIAGNOSIS — Z96612 Presence of left artificial shoulder joint: Secondary | ICD-10-CM | POA: Diagnosis not present

## 2022-10-24 DIAGNOSIS — G540 Brachial plexus disorders: Secondary | ICD-10-CM | POA: Diagnosis not present

## 2022-10-29 DIAGNOSIS — G540 Brachial plexus disorders: Secondary | ICD-10-CM | POA: Diagnosis not present

## 2022-10-29 DIAGNOSIS — R29898 Other symptoms and signs involving the musculoskeletal system: Secondary | ICD-10-CM | POA: Diagnosis not present

## 2022-10-30 DIAGNOSIS — G4700 Insomnia, unspecified: Secondary | ICD-10-CM | POA: Diagnosis not present

## 2022-10-30 DIAGNOSIS — Z0000 Encounter for general adult medical examination without abnormal findings: Secondary | ICD-10-CM | POA: Diagnosis not present

## 2022-10-30 DIAGNOSIS — M3500 Sicca syndrome, unspecified: Secondary | ICD-10-CM | POA: Diagnosis not present

## 2022-10-30 DIAGNOSIS — Z1389 Encounter for screening for other disorder: Secondary | ICD-10-CM | POA: Diagnosis not present

## 2022-10-30 DIAGNOSIS — E782 Mixed hyperlipidemia: Secondary | ICD-10-CM | POA: Diagnosis not present

## 2022-10-30 DIAGNOSIS — I10 Essential (primary) hypertension: Secondary | ICD-10-CM | POA: Diagnosis not present

## 2022-10-30 DIAGNOSIS — R7303 Prediabetes: Secondary | ICD-10-CM | POA: Diagnosis not present

## 2022-10-30 DIAGNOSIS — E559 Vitamin D deficiency, unspecified: Secondary | ICD-10-CM | POA: Diagnosis not present

## 2022-10-30 DIAGNOSIS — J45909 Unspecified asthma, uncomplicated: Secondary | ICD-10-CM | POA: Diagnosis not present

## 2022-11-06 DIAGNOSIS — M25512 Pain in left shoulder: Secondary | ICD-10-CM | POA: Diagnosis not present

## 2022-11-08 DIAGNOSIS — M25512 Pain in left shoulder: Secondary | ICD-10-CM | POA: Diagnosis not present

## 2022-11-13 DIAGNOSIS — M25512 Pain in left shoulder: Secondary | ICD-10-CM | POA: Diagnosis not present

## 2022-11-15 DIAGNOSIS — M069 Rheumatoid arthritis, unspecified: Secondary | ICD-10-CM | POA: Diagnosis not present

## 2022-11-15 DIAGNOSIS — M25512 Pain in left shoulder: Secondary | ICD-10-CM | POA: Diagnosis not present

## 2022-11-22 DIAGNOSIS — M25512 Pain in left shoulder: Secondary | ICD-10-CM | POA: Diagnosis not present

## 2022-11-27 DIAGNOSIS — M3500 Sicca syndrome, unspecified: Secondary | ICD-10-CM | POA: Diagnosis not present

## 2022-11-27 DIAGNOSIS — M1990 Unspecified osteoarthritis, unspecified site: Secondary | ICD-10-CM | POA: Diagnosis not present

## 2022-11-27 DIAGNOSIS — M25512 Pain in left shoulder: Secondary | ICD-10-CM | POA: Diagnosis not present

## 2022-11-29 DIAGNOSIS — M25512 Pain in left shoulder: Secondary | ICD-10-CM | POA: Diagnosis not present

## 2022-12-04 DIAGNOSIS — M25512 Pain in left shoulder: Secondary | ICD-10-CM | POA: Diagnosis not present

## 2022-12-11 DIAGNOSIS — M25512 Pain in left shoulder: Secondary | ICD-10-CM | POA: Diagnosis not present

## 2022-12-13 DIAGNOSIS — M25512 Pain in left shoulder: Secondary | ICD-10-CM | POA: Diagnosis not present

## 2022-12-18 DIAGNOSIS — M25512 Pain in left shoulder: Secondary | ICD-10-CM | POA: Diagnosis not present

## 2022-12-20 DIAGNOSIS — M25512 Pain in left shoulder: Secondary | ICD-10-CM | POA: Diagnosis not present

## 2022-12-26 DIAGNOSIS — M25512 Pain in left shoulder: Secondary | ICD-10-CM | POA: Diagnosis not present

## 2022-12-26 DIAGNOSIS — Z96612 Presence of left artificial shoulder joint: Secondary | ICD-10-CM | POA: Diagnosis not present

## 2022-12-26 DIAGNOSIS — G540 Brachial plexus disorders: Secondary | ICD-10-CM | POA: Diagnosis not present

## 2022-12-31 DIAGNOSIS — Z111 Encounter for screening for respiratory tuberculosis: Secondary | ICD-10-CM | POA: Diagnosis not present

## 2022-12-31 DIAGNOSIS — M79641 Pain in right hand: Secondary | ICD-10-CM | POA: Diagnosis not present

## 2022-12-31 DIAGNOSIS — D8989 Other specified disorders involving the immune mechanism, not elsewhere classified: Secondary | ICD-10-CM | POA: Diagnosis not present

## 2022-12-31 DIAGNOSIS — M79672 Pain in left foot: Secondary | ICD-10-CM | POA: Diagnosis not present

## 2022-12-31 DIAGNOSIS — M79671 Pain in right foot: Secondary | ICD-10-CM | POA: Diagnosis not present

## 2022-12-31 DIAGNOSIS — M1991 Primary osteoarthritis, unspecified site: Secondary | ICD-10-CM | POA: Diagnosis not present

## 2022-12-31 DIAGNOSIS — M79642 Pain in left hand: Secondary | ICD-10-CM | POA: Diagnosis not present

## 2022-12-31 DIAGNOSIS — M2540 Effusion, unspecified joint: Secondary | ICD-10-CM | POA: Diagnosis not present

## 2022-12-31 DIAGNOSIS — Z8739 Personal history of other diseases of the musculoskeletal system and connective tissue: Secondary | ICD-10-CM | POA: Diagnosis not present

## 2022-12-31 DIAGNOSIS — M25512 Pain in left shoulder: Secondary | ICD-10-CM | POA: Diagnosis not present

## 2022-12-31 DIAGNOSIS — M2560 Stiffness of unspecified joint, not elsewhere classified: Secondary | ICD-10-CM | POA: Diagnosis not present

## 2023-01-02 DIAGNOSIS — M25512 Pain in left shoulder: Secondary | ICD-10-CM | POA: Diagnosis not present

## 2023-01-04 DIAGNOSIS — M25571 Pain in right ankle and joints of right foot: Secondary | ICD-10-CM | POA: Diagnosis not present

## 2023-01-04 DIAGNOSIS — M25561 Pain in right knee: Secondary | ICD-10-CM | POA: Diagnosis not present

## 2023-01-07 DIAGNOSIS — M1991 Primary osteoarthritis, unspecified site: Secondary | ICD-10-CM | POA: Diagnosis not present

## 2023-01-07 DIAGNOSIS — Z8739 Personal history of other diseases of the musculoskeletal system and connective tissue: Secondary | ICD-10-CM | POA: Diagnosis not present

## 2023-01-07 DIAGNOSIS — M059 Rheumatoid arthritis with rheumatoid factor, unspecified: Secondary | ICD-10-CM | POA: Diagnosis not present

## 2023-01-07 DIAGNOSIS — M2540 Effusion, unspecified joint: Secondary | ICD-10-CM | POA: Diagnosis not present

## 2023-01-07 DIAGNOSIS — E669 Obesity, unspecified: Secondary | ICD-10-CM | POA: Diagnosis not present

## 2023-01-07 DIAGNOSIS — Z6833 Body mass index (BMI) 33.0-33.9, adult: Secondary | ICD-10-CM | POA: Diagnosis not present

## 2023-01-07 DIAGNOSIS — M2560 Stiffness of unspecified joint, not elsewhere classified: Secondary | ICD-10-CM | POA: Diagnosis not present

## 2023-01-08 DIAGNOSIS — M25512 Pain in left shoulder: Secondary | ICD-10-CM | POA: Diagnosis not present

## 2023-01-10 DIAGNOSIS — M25512 Pain in left shoulder: Secondary | ICD-10-CM | POA: Diagnosis not present

## 2023-01-15 DIAGNOSIS — M25512 Pain in left shoulder: Secondary | ICD-10-CM | POA: Diagnosis not present

## 2023-01-17 DIAGNOSIS — M25512 Pain in left shoulder: Secondary | ICD-10-CM | POA: Diagnosis not present

## 2023-01-22 DIAGNOSIS — M25512 Pain in left shoulder: Secondary | ICD-10-CM | POA: Diagnosis not present

## 2023-01-31 DIAGNOSIS — M25512 Pain in left shoulder: Secondary | ICD-10-CM | POA: Diagnosis not present

## 2023-02-04 DIAGNOSIS — M059 Rheumatoid arthritis with rheumatoid factor, unspecified: Secondary | ICD-10-CM | POA: Diagnosis not present

## 2023-02-13 DIAGNOSIS — M25512 Pain in left shoulder: Secondary | ICD-10-CM | POA: Diagnosis not present

## 2023-02-15 DIAGNOSIS — M25512 Pain in left shoulder: Secondary | ICD-10-CM | POA: Diagnosis not present

## 2023-02-21 DIAGNOSIS — M25512 Pain in left shoulder: Secondary | ICD-10-CM | POA: Diagnosis not present

## 2023-02-25 DIAGNOSIS — Z96612 Presence of left artificial shoulder joint: Secondary | ICD-10-CM | POA: Diagnosis not present

## 2023-03-04 DIAGNOSIS — M059 Rheumatoid arthritis with rheumatoid factor, unspecified: Secondary | ICD-10-CM | POA: Diagnosis not present

## 2023-03-04 DIAGNOSIS — M25512 Pain in left shoulder: Secondary | ICD-10-CM | POA: Diagnosis not present

## 2023-03-05 DIAGNOSIS — M2141 Flat foot [pes planus] (acquired), right foot: Secondary | ICD-10-CM | POA: Diagnosis not present

## 2023-03-05 DIAGNOSIS — M76821 Posterior tibial tendinitis, right leg: Secondary | ICD-10-CM | POA: Diagnosis not present

## 2023-03-05 DIAGNOSIS — M25571 Pain in right ankle and joints of right foot: Secondary | ICD-10-CM | POA: Diagnosis not present

## 2023-03-08 DIAGNOSIS — M25512 Pain in left shoulder: Secondary | ICD-10-CM | POA: Diagnosis not present

## 2023-03-11 NOTE — Progress Notes (Deleted)
Cardiology Clinic Note   Date: 03/11/2023 ID: Kathy Duran, DOB 1946/02/22, MRN [id number]  Primary Cardiologist:  Chrystie Nose, MD  Patient Profile    Kathy Duran is a 77 y.o. female who presents to the clinic today for ***    Past medical history significant for: Chest pain/dyspnea. Nuclear stress test 09/27/2020: Normal, low risk study. Echo 10/04/2020: EF 60 to 65%.  No RWMA.  Moderate asymmetric LVH.  Grade I DD.  Normal RV function.  Mildly elevated PA pressure.  Mild AI.  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 Chronic lower extremity edema. Hypertension. Hyperlipidemia. CT cardiac scoring 04/26/2021: Coronary calcium score of 0. GERD. Asthma.     History of Present Illness    Kathy Duran was first evaluated by Dr. Rennis Golden on 11/20/2013 for lower extremity edema at the request of Dr. Donette Larry.  She continues to be followed by Dr. Rennis Golden for the above outlined history.  Last seen in the office by Dr. Rennis Golden on 10/13/2021 for routine follow-up.  She was doing well at that time and no changes were made.  Today, patient ***  Chronic lower extremity edema.*** Hypertension: BP today *** Patient denies headaches, dizziness or vision changes. Continue losartan. Hyperlipidemia.***  ROS: All other systems reviewed and are otherwise negative except as noted in History of Present Illness.  Studies Reviewed       ***  Risk Assessment/Calculations    {Does this patient have ATRIAL FIBRILLATION?:[phone]} No BP recorded.  {Refresh Note OR Click here to enter BP  :1}***        Physical Exam    VS:  There were no vitals taken for this visit. , BMI There is no height or weight on file to calculate BMI.  GEN: Well nourished, well developed, in no acute distress. Neck: No JVD or carotid bruits. Cardiac: *** RRR. No murmurs. No rubs or gallops.   Respiratory:  Respirations regular and unlabored. Clear to auscultation without rales, wheezing or rhonchi. GI: Soft, nontender,  nondistended. Extremities: Radials/DP/PT 2+ and equal bilaterally. No clubbing or cyanosis. No edema ***  Skin: Warm and dry, no rash. Neuro: Strength intact.  Assessment & Plan   ***  Disposition: ***     {Are you ordering a CV Procedure (e.g. stress test, cath, DCCV, TEE, etc)?   Press F2        :[id number]}   Signed, Etta Grandchild. Shean Gerding, DNP, NP-C

## 2023-03-12 DIAGNOSIS — M25512 Pain in left shoulder: Secondary | ICD-10-CM | POA: Diagnosis not present

## 2023-03-13 ENCOUNTER — Ambulatory Visit: Payer: Medicare PPO | Admitting: Student

## 2023-03-15 DIAGNOSIS — M25512 Pain in left shoulder: Secondary | ICD-10-CM | POA: Diagnosis not present

## 2023-03-18 DIAGNOSIS — M25512 Pain in left shoulder: Secondary | ICD-10-CM | POA: Diagnosis not present

## 2023-03-20 ENCOUNTER — Other Ambulatory Visit: Payer: Self-pay

## 2023-03-20 ENCOUNTER — Ambulatory Visit (HOSPITAL_COMMUNITY): Payer: Medicare PPO | Attending: Orthopaedic Surgery | Admitting: Physical Therapy

## 2023-03-20 DIAGNOSIS — R262 Difficulty in walking, not elsewhere classified: Secondary | ICD-10-CM | POA: Diagnosis not present

## 2023-03-20 DIAGNOSIS — M25571 Pain in right ankle and joints of right foot: Secondary | ICD-10-CM | POA: Insufficient documentation

## 2023-03-20 NOTE — Therapy (Signed)
OUTPATIENT PHYSICAL THERAPY LOWER EXTREMITY EVALUATION   Patient Name: Kathy Duran MRN: [id number] DOB:1945-11-17, 77 y.o., female Today's Date: 03/20/2023  END OF SESSION:  PT End of Session - 03/20/23 1340    Visit Number 1    Number of Visits 12    Date for PT Re-Evaluation 05/01/23    Authorization Type humana-auth put in    Progress Note Due on Visit 10    PT Start Time 1300    PT Stop Time 1340    PT Time Calculation (min) 40 min    Activity Tolerance Patient tolerated treatment well    Behavior During Therapy [facility] for tasks assessed/performed             Past Medical History:  Diagnosis Date   Anxiety    Arthritis    left hip   Asthma    Inhalers for tx   Bilateral primary osteoarthritis of knee 07/06/2016   Moderate   Bilateral swelling of feet    bilateral ankle and feet edema   Bruises easily    Complication of anesthesia    sometimes needs oxygen when waking uo due to asthma   DDD (degenerative disc disease), lumbar 07/06/2016   Fibromyalgia    GERD (gastroesophageal reflux disease)    Hypertension    Myofacial muscle pain 07/06/2016   Osteoarthritis of both hands 07/06/2016   Pre-diabetes    Seasonal allergies 07/06/2016   Sinus complaint    Sjogren's syndrome (HCC)    dry eyes   Tenonitis    Urinary incontinence    occ. an issue wears pads   Past Surgical History:  Procedure Laterality Date   ABDOMINAL HYSTERECTOMY  1986   BACK SURGERY  1985   BREAST CYST ASPIRATION     BREAST SURGERY Right    cyst drainage   CARPAL TUNNEL RELEASE Right 2001   KNEE SURGERY  1991   REVERSE SHOULDER ARTHROPLASTY Left 03/01/2022   Procedure: REVERSE SHOULDER ARTHROPLASTY;  Surgeon: Francena Hanly, MD;  Location: [facility];  Service: Orthopedics;  Laterality: Left;    SHOULDER SURGERY Right    right shoulder tendon and rotator cuff repair   TOTAL HIP ARTHROPLASTY Right 2006   TOTAL HIP ARTHROPLASTY Left 02/24/2016   Procedure: LEFT TOTAL HIP  ARTHROPLASTY ANTERIOR APPROACH;  Surgeon: Kathryne Hitch, MD;  Location: [facility];  Service: Orthopedics;  Laterality: Left;   TOTAL KNEE ARTHROPLASTY Left 11/16/2019   Procedure: TOTAL KNEE ARTHROPLASTY;  Surgeon: Ollen Gross, MD;  Location: [facility];  Service: Orthopedics;  Laterality: Left;    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   Primary osteoarthritis of left knee 11/16/2019   Unilateral primary osteoarthritis, left knee 05/25/2019   Trochanteric bursitis, left hip 03/07/2017   Lymphedema 12/06/2016   Bilateral bunions 08/21/2016   Seasonal allergies 07/06/2016   Asthma 07/06/2016   Sjogren's syndrome (HCC) 07/06/2016   Bilateral primary osteoarthritis of knee 07/06/2016   Osteoarthritis of both hands 07/06/2016   Myofacial muscle pain 07/06/2016   DDD lumbar spine with spinal stenosis 07/06/2016   Abnormality of gait 06/26/2016   Chronic pain of right ankle 06/26/2016   H/O bilateral hip replacements 02/24/2016   Varicose veins 04/20/2015   Lipidemia 04/20/2015   Rheumatoid factor positive 11/20/2013   Back pain with left-sided sciatica 11/20/2013   HTN (hypertension) 11/20/2013   GERD (gastroesophageal reflux disease) 11/20/2013   Bladder spasm 11/20/2013  Hyperlipidemia 11/20/2013   Bilateral swelling of feet    Bruises easily     PCP: Georgann Housekeeper  REFERRING PROVIDER: Netta Cedars, MD  REFERRING DIAG:  Diagnosis  M25.571 (ICD-10-CM) - Pain in right ankle and joints of right foot    THERAPY DIAG:  Diagnosis  M25.571 (ICD-10-CM) - Pain in right ankle and joints of right foot  Muscle weakness Rt ankle pain  Rationale for Evaluation and Treatment: Rehabilitation  ONSET DATE: chronic with acute exacerbation on     SUBJECTIVE STATEMENT: Pt states that she has had ankle pains on and off for years.  This acute episode started about 3 months ago, pt started using a  cane since this time.  Pt states new shoes seemed to flare her up.  Pt states that she is only able to be on her feet for about 10 minutes with her cane at this time and then she is very sore.  aPt was given an ankle brace from the ortho MD.  PERTINENT HISTORY: OA, THR B, TSR, TKR Lt  PAIN:  Are you having pain? Yes: NPRS scale: 5/10;  highest is a 10/10; lowest 2 (pt has other pain but we will not address) Pain location: inside of foot.  Pt wears orthotics but she has not had new orthotics for 5 years.  Pain description: burning and sharp  Aggravating factors:wt bearing  Relieving factors: getting off her feet, ice and elevate.    PRECAUTIONS: None  RED FLAGS: None   WEIGHT BEARING RESTRICTIONS: No  FALLS:  Has patient fallen in last 6 months? No  LIVING ENVIRONMENT: Lives with: lives with their family Lives in: House/apartment Stairs: Yes: External: 2 steps; on right going up; goes up step to not reciprocal  Has following equipment at home: Single point cane  OCCUPATION: retired  PLOF: Independent with household mobility with device  PATIENT GOALS: less pain in her Rt ankle , get the strength that she needs back in her ankle, to be able to shop and be on her feet longer to be able to enjoy her life again.    NEXT MD VISIT: 04/27/23  OBJECTIVE:   DIAGNOSTIC FINDINGS: none available for ankle   PATIENT SURVEYS:  FOTO 22  COGNITION: Overall cognitive status: Within functional limits for tasks assessed     EDEMA:  Noted edema in both LE's pt states that the MD wanted therapy to look at this therapist explained that it most likely is lymphedema and we can see her following ankle rehab for this.     POSTURE: rounded shoulders, decreased lumbar lordosis, and increased thoracic kyphosis  PALPATION: Sore to the touch   LOWER EXTREMITY ROM:  Active ROM Right eval Left eval  Hip flexion    Hip extension    Hip abduction    Hip adduction    Hip internal rotation     Hip external rotation    Knee flexion    Knee extension Lacking 15    Ankle dorsiflexion 4 5  Ankle plantarflexion 45 50  Ankle inversion 12 30  Ankle eversion 10 12   (Blank rows = not tested)  LOWER EXTREMITY MMT:  MMT Right eval Left eval  Hip flexion    Hip extension    Hip abduction    Hip adduction    Hip internal rotation    Hip external rotation    Knee flexion    Knee extension    Ankle dorsiflexion 4- 4  Ankle plantarflexion 1 4  Ankle inversion 3- 5  Ankle eversion 5 5   (Blank rows = not tested)  FUNCTIONAL TESTS:  30 seconds chair stand test: 4 reps , 6 is poor for age and sex; 56 is average.  2 minute walk test: using a cane 202 ft   Single leg stance: Lt: 10 seconds, Rt: 0"   TODAY'S TREATMENT:                                                                                                                              DATE:  03/20/23:  Evaluation   Ankle alphabet Heel raises x 10 Quad set x 10     PATIENT EDUCATION:  Education details: HEP Person educated: Patient Education method: Programmer, multimedia, Verbal cues, and Handouts Education comprehension: returned demonstration  HOME EXERCISE PROGRAM: - Supine Quadricep Sets  - 10 x daily - 7 x weekly - 1 sets - 10 reps - Seated Ankle Alphabet  - 3 x daily - 7 x weekly - 1 sets - 1 reps - Heel Raises with Counter Support  - 3 x daily - 7 x weekly - 3 sets - 10 reps  ASSESSMENT:  CLINICAL IMPRESSION: Patient is a 77 y.o. female who was seen today for physical therapy evaluation and treatment for Rt ankle pain. Pt evaluation demonstrates decreased ROM, decreased strength, decreased balance, decreased activity tolerance and increased pain.  Ms. Ko will benefit from skilled PT to address these deficits and improve her pain and functional ability.    OBJECTIVE IMPAIRMENTS: Abnormal gait, decreased activity tolerance, decreased balance, difficulty walking, decreased ROM, and decreased strength.   ACTIVITY  LIMITATIONS: standing, squatting, stairs, and locomotion level  PARTICIPATION LIMITATIONS: cleaning, shopping, community activity, and yard work  PERSONAL FACTORS: Time since onset of injury/illness/exacerbation and 1-2 comorbidities: OA with multiple jt replacements   are also affecting patient's functional outcome.   REHAB POTENTIAL: Good  CLINICAL DECISION MAKING: Stable/uncomplicated  EVALUATION COMPLEXITY: Moderate   GOALS: Goals reviewed with patient? No  SHORT TERM GOALS: Target date: 04/10/23 PT to be I in HEP in order to decrease pain to no greater than a 6/10  Baseline: Goal status: INITIAL  2.  Pt ankle strength to be increased 1/2 grade to be able to be up on RT LE for  20   minutes prior to increased pain  Baseline:  Goal status: INITIAL  3.  Pt to be able to single leg stance for 5 seconds for reduced risk of falling.  Baseline:  Goal status: INITIAL   LONG TERM GOALS: Target date: 05/02/23  PT to be I in an advanced HEP in order to decrease pain to no greater than a 3 to be able to no longer need her cane or ankle brace.   Baseline: Baseline:  Goal status: INITIAL  2.  Pt ankle strength to be 4+/5 to able to be up on RT LE for  60 minutes prior to increased pain to  allow pt to complete shopping/housework  Baseline:  Goal status: INITIAL  3.  Pt to be able to single leg stance for 15 seconds for reduced risk of falling. Baseline:  Goal status: INITIAL     PLAN:  PT FREQUENCY: 2x/week  PT DURATION: 6 weeks  PLANNED INTERVENTIONS: Therapeutic exercises, Therapeutic activity, Balance training, Gait training, Patient/Family education, Self Care, and Manual therapy  PLAN FOR NEXT SESSION: begin calf stretch B, ankle isometrics, sitting baps, towel exercises.  Measure calf /ankle and give info for elastic therapy for compression garments.  Virgina Organ, PT CLT [phone]  03/20/2023, 1:51 PM

## 2023-03-28 ENCOUNTER — Ambulatory Visit (HOSPITAL_COMMUNITY): Payer: Medicare PPO | Attending: Orthopaedic Surgery

## 2023-03-28 DIAGNOSIS — M25571 Pain in right ankle and joints of right foot: Secondary | ICD-10-CM | POA: Insufficient documentation

## 2023-03-28 DIAGNOSIS — R262 Difficulty in walking, not elsewhere classified: Secondary | ICD-10-CM | POA: Diagnosis not present

## 2023-03-28 NOTE — Therapy (Addendum)
OUTPATIENT PHYSICAL THERAPY LOWER EXTREMITY TREATMENT   Patient Name: Kathy Duran MRN: [id number] DOB:1945/08/09, 77 y.o., female Today's Date: 03/28/2023  END OF SESSION:   PT End of Session - 03/28/23 1355     Visit Number 2    Number of Visits 12    Date for PT Re-Evaluation 05/01/23    Authorization Type humana ([employer] approved 03/25/23 - 05/07/23)    Authorization Time Period 03/25/23 - 05/07/23    Authorization - Visit Number 1    Authorization - Number of Visits 12    Progress Note Due on Visit 10    PT Start Time 1345    PT Stop Time 1430    PT Time Calculation (min) 45 min    Activity Tolerance Patient tolerated treatment well    Behavior During Therapy WFL for tasks assessed/performed             Past Medical History:  Diagnosis Date   Anxiety    Arthritis    left hip   Asthma    Inhalers for tx   Bilateral primary osteoarthritis of knee 07/06/2016   Moderate   Bilateral swelling of feet    bilateral ankle and feet edema   Bruises easily    Complication of anesthesia    sometimes needs oxygen when waking uo due to asthma   DDD (degenerative disc disease), lumbar 07/06/2016   Fibromyalgia    GERD (gastroesophageal reflux disease)    Hypertension    Myofacial muscle pain 07/06/2016   Osteoarthritis of both hands 07/06/2016   Pre-diabetes    Seasonal allergies 07/06/2016   Sinus complaint    Sjogren's syndrome (HCC)    dry eyes   Tenonitis    Urinary incontinence    occ. an issue wears pads   Past Surgical History:  Procedure Laterality Date   ABDOMINAL HYSTERECTOMY  1986   BACK SURGERY  1985   BREAST CYST ASPIRATION     BREAST SURGERY Right    cyst drainage   CARPAL TUNNEL RELEASE Right 2001   KNEE SURGERY  1991   REVERSE SHOULDER ARTHROPLASTY Left 03/01/2022   Procedure: REVERSE SHOULDER ARTHROPLASTY;  Surgeon: Francena Hanly, MD;  Location: [facility];  Service: Orthopedics;  Laterality: Left;    SHOULDER SURGERY Right    right shoulder  tendon and rotator cuff repair   TOTAL HIP ARTHROPLASTY Right 2006   TOTAL HIP ARTHROPLASTY Left 02/24/2016   Procedure: LEFT TOTAL HIP ARTHROPLASTY ANTERIOR APPROACH;  Surgeon: Kathryne Hitch, MD;  Location: [facility];  Service: Orthopedics;  Laterality: Left;   TOTAL KNEE ARTHROPLASTY Left 11/16/2019   Procedure: TOTAL KNEE ARTHROPLASTY;  Surgeon: Ollen Gross, MD;  Location: [facility];  Service: Orthopedics;  Laterality: Left;    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   Primary osteoarthritis of left knee 11/16/2019   Unilateral primary osteoarthritis, left knee 05/25/2019   Trochanteric bursitis, left hip 03/07/2017   Lymphedema 12/06/2016   Bilateral bunions 08/21/2016   Seasonal allergies 07/06/2016   Asthma 07/06/2016   Sjogren's syndrome (HCC) 07/06/2016   Bilateral primary osteoarthritis of knee 07/06/2016   Osteoarthritis of both hands 07/06/2016   Myofacial muscle pain 07/06/2016   DDD lumbar spine with spinal stenosis 07/06/2016   Abnormality of gait 06/26/2016   Chronic pain of right ankle 06/26/2016   H/O bilateral hip replacements 02/24/2016   Varicose veins 04/20/2015   Lipidemia 04/20/2015  Rheumatoid factor positive 11/20/2013   Back pain with left-sided sciatica 11/20/2013   HTN (hypertension) 11/20/2013   GERD (gastroesophageal reflux disease) 11/20/2013   Bladder spasm 11/20/2013   Hyperlipidemia 11/20/2013   Bilateral swelling of feet    Bruises easily     PCP: Georgann Housekeeper  REFERRING PROVIDER: Netta Cedars, MD  REFERRING DIAG:  Diagnosis  M25.571 (ICD-10-CM) - Pain in right ankle and joints of right foot    THERAPY DIAG:  Diagnosis  M25.571 (ICD-10-CM) - Pain in right ankle and joints of right foot  Muscle weakness Rt ankle pain  Rationale for Evaluation and Treatment: Rehabilitation  ONSET DATE: chronic with acute exacerbation on     SUBJECTIVE  STATEMENT: Patient states that the R medial arch hurts (5/10 pain) upon immediate standing/walking and can only sustain the position for 2-3 minutes. Patient is now wearing an ankle brace and seems to feel that the brace is helping.   EVAL: Pt states that she has had ankle pains on and off for years.  This acute episode started about 3 months ago, pt started using a cane since this time.  Pt states new shoes seemed to flare her up.  Pt states that she is only able to be on her feet for about 10 minutes with her cane at this time and then she is very sore.  aPt was given an ankle brace from the ortho MD.  PERTINENT HISTORY: OA, THR B, TSR, TKR Lt  PAIN:  Are you having pain? Yes: NPRS scale: 5/10;  highest is a 10/10; lowest 2 (pt has other pain but we will not address) Pain location: inside of foot.  Pt wears orthotics but she has not had new orthotics for 5 years.  Pain description: burning and sharp  Aggravating factors:wt bearing  Relieving factors: getting off her feet, ice and elevate.    PRECAUTIONS: None  RED FLAGS: None   WEIGHT BEARING RESTRICTIONS: No  FALLS:  Has patient fallen in last 6 months? No  LIVING ENVIRONMENT: Lives with: lives with their family Lives in: House/apartment Stairs: Yes: External: 2 steps; on right going up; goes up step to not reciprocal  Has following equipment at home: Single point cane  OCCUPATION: retired  PLOF: Independent with household mobility with device  PATIENT GOALS: less pain in her Rt ankle , get the strength that she needs back in her ankle, to be able to shop and be on her feet longer to be able to enjoy her life again.    NEXT MD VISIT: 04/27/23  OBJECTIVE:   DIAGNOSTIC FINDINGS: none available for ankle   PATIENT SURVEYS:  FOTO 22  COGNITION: Overall cognitive status: Within functional limits for tasks assessed     EDEMA:  Noted edema in both LE's pt states that the MD wanted therapy to look at this therapist  explained that it most likely is lymphedema and we can see her following ankle rehab for this.     POSTURE: rounded shoulders, decreased lumbar lordosis, and increased thoracic kyphosis  PALPATION: Sore to the touch   LOWER EXTREMITY ROM:  Active ROM Right eval Left eval  Hip flexion    Hip extension    Hip abduction    Hip adduction    Hip internal rotation    Hip external rotation    Knee flexion    Knee extension Lacking 15    Ankle dorsiflexion 4 5  Ankle plantarflexion 45 50  Ankle inversion 12 30  Ankle  eversion 10 12   (Blank rows = not tested)  LOWER EXTREMITY MMT:  MMT Right eval Left eval  Hip flexion    Hip extension    Hip abduction    Hip adduction    Hip internal rotation    Hip external rotation    Knee flexion    Knee extension    Ankle dorsiflexion 4- 4  Ankle plantarflexion 1 4  Ankle inversion 3- 5  Ankle eversion 5 5   (Blank rows = not tested)  FUNCTIONAL TESTS:  30 seconds chair stand test: 4 reps , 6 is poor for age and sex; 29 is average.  2 minute walk test: using a cane 202 ft   Single leg stance: Lt: 10 seconds, Rt: 0"   TODAY'S TREATMENT:                                                                                                                              DATE:  03/28/23 Seated hamstring stretch x 30" x 3 Seated hip abd, YTB x 3" x 10 x 2 Seated R gastrocnemius stretch with a strap x 30" x 3 Seated R Plantar fascia self STM with a tennis ball x 1' Seated ankle DF/PF and inversion, YTB x 10 x 2 Seated R towel scrunches x 1' Seated ankle alphabets x 1 set  03/20/23:  Evaluation   Ankle alphabet Heel raises x 10 Quad set x 10     PATIENT EDUCATION:  Education details: HEP Person educated: Patient Education method: Programmer, multimedia, Verbal cues, and Handouts Education comprehension: returned demonstration  HOME EXERCISE PROGRAM: Access Code: [email or web address] URL: [email or web address] Date: 03/28/2023 Prepared  by: Krystal Clark  Exercises - Seated Hamstring Stretch  - 1-2 x daily - 7 x weekly - 3 reps - 30 hold - Seated Calf Stretch with Strap  - 1-2 x daily - 7 x weekly - 3 reps - 30 hold - Seated Plantar Fascia Mobilization with Small Ball  - 1-2 x daily - 7 x weekly - Ankle and Toe Plantarflexion with Resistance  - 1-2 x daily - 7 x weekly - 2 sets - 10 reps - Ankle Dorsiflexion with Resistance  - 1-2 x daily - 7 x weekly - 2 sets - 10 reps - Seated Ankle Inversion with Resistance and Legs Crossed  - 1-2 x daily - 7 x weekly - 2 sets - 10 reps - Towel Scrunches  - 1-2 x daily - 7 x weekly - Seated Hip Abduction with Resistance  - 1-2 x daily - 7 x weekly - 2 sets - 10 reps - 3 hold  - Supine Quadricep Sets  - 10 x daily - 7 x weekly - 1 sets - 10 reps - Seated Ankle Alphabet  - 3 x daily - 7 x weekly - 1 sets - 1 reps - Heel Raises with Counter Support  - 3 x daily - 7 x weekly - 3 sets -  10 reps  ASSESSMENT:  CLINICAL IMPRESSION: Interventions today were geared towards LE strengthening and flexibility. Tolerated all activities with mild to moderate difficulty especially on towel scrunches and ankle inv due to weakness. Demonstrated appropriate levels of fatigue. Provided mild amount of multimodal cueing to ensure correct execution of activity with fair to good carry-over. To date, skilled PT is required to address the impairments and improve function especially with additional hip and knee strengthening as patient's genu valgus could be affecting her ankles.  EVAL: Patient is a 77 y.o. female who was seen today for physical therapy evaluation and treatment for Rt ankle pain. Pt evaluation demonstrates decreased ROM, decreased strength, decreased balance, decreased activity tolerance and increased pain.  Ms. Reichenberg will benefit from skilled PT to address these deficits and improve her pain and functional ability.    OBJECTIVE IMPAIRMENTS: Abnormal gait, decreased activity tolerance, decreased  balance, difficulty walking, decreased ROM, and decreased strength.   ACTIVITY LIMITATIONS: standing, squatting, stairs, and locomotion level  PARTICIPATION LIMITATIONS: cleaning, shopping, community activity, and yard work  PERSONAL FACTORS: Time since onset of injury/illness/exacerbation and 1-2 comorbidities: OA with multiple jt replacements   are also affecting patient's functional outcome.   REHAB POTENTIAL: Good  CLINICAL DECISION MAKING: Stable/uncomplicated  EVALUATION COMPLEXITY: Moderate   GOALS: Goals reviewed with patient? No  SHORT TERM GOALS: Target date: 04/10/23 PT to be I in HEP in order to decrease pain to no greater than a 6/10  Baseline: Goal status: INITIAL  2.  Pt ankle strength to be increased 1/2 grade to be able to be up on RT LE for  20   minutes prior to increased pain  Baseline:  Goal status: INITIAL  3.  Pt to be able to single leg stance for 5 seconds for reduced risk of falling.  Baseline:  Goal status: INITIAL   LONG TERM GOALS: Target date: 05/02/23  PT to be I in an advanced HEP in order to decrease pain to no greater than a 3 to be able to no longer need her cane or ankle brace.   Baseline: Baseline:  Goal status: INITIAL  2.  Pt ankle strength to be 4+/5 to able to be up on RT LE for  60 minutes prior to increased pain to allow pt to complete shopping/housework  Baseline:  Goal status: INITIAL  3.  Pt to be able to single leg stance for 15 seconds for reduced risk of falling. Baseline:  Goal status: INITIAL     PLAN:  PT FREQUENCY: 2x/week  PT DURATION: 6 weeks  PLANNED INTERVENTIONS: Therapeutic exercises, Therapeutic activity, Balance training, Gait training, Patient/Family education, Self Care, and Manual therapy  PLAN FOR NEXT SESSION: Continue POC and may progress as tolerated with emphasis on ankle strengthening and flexibility as well as hip and knee strengthening to minimize genu valgus.  Measure calf /ankle and give  info for elastic therapy for compression garments.  Tish Frederickson. Tonita Bills, PT, DPT, OCS Board-Certified Clinical Specialist in Orthopedic PT PT Compact Privilege # ([place]): [phone]  03/28/2023, 2:34 PM

## 2023-04-01 DIAGNOSIS — E669 Obesity, unspecified: Secondary | ICD-10-CM | POA: Diagnosis not present

## 2023-04-01 DIAGNOSIS — M1991 Primary osteoarthritis, unspecified site: Secondary | ICD-10-CM | POA: Diagnosis not present

## 2023-04-01 DIAGNOSIS — M2560 Stiffness of unspecified joint, not elsewhere classified: Secondary | ICD-10-CM | POA: Diagnosis not present

## 2023-04-01 DIAGNOSIS — M059 Rheumatoid arthritis with rheumatoid factor, unspecified: Secondary | ICD-10-CM | POA: Diagnosis not present

## 2023-04-01 DIAGNOSIS — M2540 Effusion, unspecified joint: Secondary | ICD-10-CM | POA: Diagnosis not present

## 2023-04-01 DIAGNOSIS — Z8739 Personal history of other diseases of the musculoskeletal system and connective tissue: Secondary | ICD-10-CM | POA: Diagnosis not present

## 2023-04-01 DIAGNOSIS — Z6833 Body mass index (BMI) 33.0-33.9, adult: Secondary | ICD-10-CM | POA: Diagnosis not present

## 2023-04-02 ENCOUNTER — Encounter (HOSPITAL_COMMUNITY): Payer: Self-pay

## 2023-04-02 ENCOUNTER — Ambulatory Visit (HOSPITAL_COMMUNITY): Payer: Medicare PPO

## 2023-04-02 DIAGNOSIS — M25571 Pain in right ankle and joints of right foot: Secondary | ICD-10-CM | POA: Diagnosis not present

## 2023-04-02 DIAGNOSIS — R262 Difficulty in walking, not elsewhere classified: Secondary | ICD-10-CM | POA: Diagnosis not present

## 2023-04-02 NOTE — Therapy (Signed)
OUTPATIENT PHYSICAL THERAPY LOWER EXTREMITY TREATMENT   Patient Name: Kathy Duran MRN: [id number] DOB:November 20, 1945, 77 y.o., female Today's Date: 04/02/2023  END OF SESSION:   PT End of Session - 04/02/23 1348     Visit Number 3    Number of Visits 12    Date for PT Re-Evaluation 05/01/23    Authorization Type humana (cohere approved 12 visits from 03/25/23 - 05/07/23)    Authorization Time Period 03/25/23 - 05/07/23    Authorization - Visit Number 2    Authorization - Number of Visits 12    Progress Note Due on Visit 10    PT Start Time 1302    PT Stop Time 1341    PT Time Calculation (min) 39 min    Equipment Utilized During Treatment Gait belt    Activity Tolerance Patient tolerated treatment well    Behavior During Therapy WFL for tasks assessed/performed              Past Medical History:  Diagnosis Date   Anxiety    Arthritis    left hip   Asthma    Inhalers for tx   Bilateral primary osteoarthritis of knee 07/06/2016   Moderate   Bilateral swelling of feet    bilateral ankle and feet edema   Bruises easily    Complication of anesthesia    sometimes needs oxygen when waking uo due to asthma   DDD (degenerative disc disease), lumbar 07/06/2016   Fibromyalgia    GERD (gastroesophageal reflux disease)    Hypertension    Myofacial muscle pain 07/06/2016   Osteoarthritis of both hands 07/06/2016   Pre-diabetes    Seasonal allergies 07/06/2016   Sinus complaint    Sjogren's syndrome (HCC)    dry eyes   Tenonitis    Urinary incontinence    occ. an issue wears pads   Past Surgical History:  Procedure Laterality Date   ABDOMINAL HYSTERECTOMY  1986   BACK SURGERY  1985   BREAST CYST ASPIRATION     BREAST SURGERY Right    cyst drainage   CARPAL TUNNEL RELEASE Right 2001   KNEE SURGERY  1991   REVERSE SHOULDER ARTHROPLASTY Left 03/01/2022   Procedure: REVERSE SHOULDER ARTHROPLASTY;  Surgeon: Francena Hanly, MD;  Location: [facility];  Service: Orthopedics;   Laterality: Left;    SHOULDER SURGERY Right    right shoulder tendon and rotator cuff repair   TOTAL HIP ARTHROPLASTY Right 2006   TOTAL HIP ARTHROPLASTY Left 02/24/2016   Procedure: LEFT TOTAL HIP ARTHROPLASTY ANTERIOR APPROACH;  Surgeon: Kathryne Hitch, MD;  Location: [facility];  Service: Orthopedics;  Laterality: Left;   TOTAL KNEE ARTHROPLASTY Left 11/16/2019   Procedure: TOTAL KNEE ARTHROPLASTY;  Surgeon: Ollen Gross, MD;  Location: [facility];  Service: Orthopedics;  Laterality: Left;    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   Primary osteoarthritis of left knee 11/16/2019   Unilateral primary osteoarthritis, left knee 05/25/2019   Trochanteric bursitis, left hip 03/07/2017   Lymphedema 12/06/2016   Bilateral bunions 08/21/2016   Seasonal allergies 07/06/2016   Asthma 07/06/2016   Sjogren's syndrome (HCC) 07/06/2016   Bilateral primary osteoarthritis of knee 07/06/2016   Osteoarthritis of both hands 07/06/2016   Myofacial muscle pain 07/06/2016   DDD lumbar spine with spinal stenosis 07/06/2016   Abnormality of gait 06/26/2016   Chronic pain of right ankle 06/26/2016   H/O  bilateral hip replacements 02/24/2016   Varicose veins 04/20/2015   Lipidemia 04/20/2015   Rheumatoid factor positive 11/20/2013   Back pain with left-sided sciatica 11/20/2013   HTN (hypertension) 11/20/2013   GERD (gastroesophageal reflux disease) 11/20/2013   Bladder spasm 11/20/2013   Hyperlipidemia 11/20/2013   Bilateral swelling of feet    Bruises easily     PCP: Georgann Housekeeper  REFERRING PROVIDER: Netta Cedars, MD  REFERRING DIAG:  Diagnosis  M25.571 (ICD-10-CM) - Pain in right ankle and joints of right foot    THERAPY DIAG:  Diagnosis  M25.571 (ICD-10-CM) - Pain in right ankle and joints of right foot  Muscle weakness Rt ankle pain  Rationale for Evaluation and Treatment:  Rehabilitation  ONSET DATE: chronic with acute exacerbation on     SUBJECTIVE STATEMENT: 3/10 pain today. Still doing HEP without questions or complaints.   EVAL: Pt states that she has had ankle pains on and off for years.  This acute episode started about 3 months ago, pt started using a cane since this time.  Pt states new shoes seemed to flare her up.  Pt states that she is only able to be on her feet for about 10 minutes with her cane at this time and then she is very sore.  aPt was given an ankle brace from the ortho MD.  PERTINENT HISTORY: OA, THR B, TSR, TKR Lt  PAIN:  Are you having pain? Yes: NPRS scale: 5/10;  highest is a 10/10; lowest 2 (pt has other pain but we will not address) Pain location: inside of foot.  Pt wears orthotics but she has not had new orthotics for 5 years.  Pain description: burning and sharp  Aggravating factors:wt bearing  Relieving factors: getting off her feet, ice and elevate.    PRECAUTIONS: None  RED FLAGS: None   WEIGHT BEARING RESTRICTIONS: No  FALLS:  Has patient fallen in last 6 months? No  LIVING ENVIRONMENT: Lives with: lives with their family Lives in: House/apartment Stairs: Yes: External: 2 steps; on right going up; goes up step to not reciprocal  Has following equipment at home: Single point cane  OCCUPATION: retired  PLOF: Independent with household mobility with device  PATIENT GOALS: less pain in her Rt ankle , get the strength that she needs back in her ankle, to be able to shop and be on her feet longer to be able to enjoy her life again.    NEXT MD VISIT: 04/27/23  OBJECTIVE:   DIAGNOSTIC FINDINGS: none available for ankle   PATIENT SURVEYS:  FOTO 22  COGNITION: Overall cognitive status: Within functional limits for tasks assessed     EDEMA:  Noted edema in both LE's pt states that the MD wanted therapy to look at this therapist explained that it most likely is lymphedema and we can see her following ankle  rehab for this.     POSTURE: rounded shoulders, decreased lumbar lordosis, and increased thoracic kyphosis  PALPATION: Sore to the touch   LOWER EXTREMITY ROM:  Active ROM Right eval Left eval  Hip flexion    Hip extension    Hip abduction    Hip adduction    Hip internal rotation    Hip external rotation    Knee flexion    Knee extension Lacking 15    Ankle dorsiflexion 4 5  Ankle plantarflexion 45 50  Ankle inversion 12 30  Ankle eversion 10 12   (Blank rows = not tested)  LOWER EXTREMITY  MMT:  MMT Right eval Left eval  Hip flexion    Hip extension    Hip abduction    Hip adduction    Hip internal rotation    Hip external rotation    Knee flexion    Knee extension    Ankle dorsiflexion 4- 4  Ankle plantarflexion 1 4  Ankle inversion 3- 5  Ankle eversion 5 5   (Blank rows = not tested)  FUNCTIONAL TESTS:  30 seconds chair stand test: 4 reps , 6 is poor for age and sex; 40 is average.  2 minute walk test: using a cane 202 ft   Single leg stance: Lt: 10 seconds, Rt: 0"   TODAY'S TREATMENT:                                                                                                                              DATE:  04/02/2023  -3 x 1' toe curls on towel. -Ankle Inversion 3 x 15 RTB; cues for eccentric control of 2 seconds.  -Seated Ankle PF x 30 with RTB -Seated Ankle DF x 30 with RTB -10x Elevated sit/stands with RTB for hip abduction facilitation; CGA for balance. -cues given for anterior weight shift. Holding yellow weighted ball.    03/28/23 Seated hamstring stretch x 30" x 3 Seated hip abd, YTB x 3" x 10 x 2 Seated R gastrocnemius stretch with a strap x 30" x 3 Seated R Plantar fascia self STM with a tennis ball x 1' Seated ankle DF/PF and inversion, YTB x 10 x 2 Seated R towel scrunches x 1' Seated ankle alphabets x 1 set  03/20/23:  Evaluation   Ankle alphabet Heel raises x 10 Quad set x 10     PATIENT EDUCATION:  Education details:  HEP Person educated: Patient Education method: Programmer, multimedia, Verbal cues, and Handouts Education comprehension: returned demonstration  HOME EXERCISE PROGRAM: Access Code: [email or web address] URL: [email or web address] Date: 03/28/2023 Prepared by: Krystal Clark  Exercises - Seated Hamstring Stretch  - 1-2 x daily - 7 x weekly - 3 reps - 30 hold - Seated Calf Stretch with Strap  - 1-2 x daily - 7 x weekly - 3 reps - 30 hold - Seated Plantar Fascia Mobilization with Small Ball  - 1-2 x daily - 7 x weekly - Ankle and Toe Plantarflexion with Resistance  - 1-2 x daily - 7 x weekly - 2 sets - 10 reps - Ankle Dorsiflexion with Resistance  - 1-2 x daily - 7 x weekly - 2 sets - 10 reps - Seated Ankle Inversion with Resistance and Legs Crossed  - 1-2 x daily - 7 x weekly - 2 sets - 10 reps - Towel Scrunches  - 1-2 x daily - 7 x weekly - Seated Hip Abduction with Resistance  - 1-2 x daily - 7 x weekly - 2 sets - 10 reps - 3 hold  - Supine Quadricep Sets  - 10 x  daily - 7 x weekly - 1 sets - 10 reps - Seated Ankle Alphabet  - 3 x daily - 7 x weekly - 1 sets - 1 reps - Heel Raises with Counter Support  - 3 x daily - 7 x weekly - 3 sets - 10 reps  ASSESSMENT:  CLINICAL IMPRESSION: Pt tolerating session well. Progressed resistance for R ankle TE and tolerated changes well. Pt showing great return for HEP. Progressed proximal BLE strengthening with functional squats and facilitation for proper alignment. Continues to be limited due to pain and weakness. Benefited from anterior cuing for proper leverage when performing sit/stands. Will continue to benefit from skilled PT services to address pain and function.   EVAL: Patient is a 77 y.o. female who was seen today for physical therapy evaluation and treatment for Rt ankle pain. Pt evaluation demonstrates decreased ROM, decreased strength, decreased balance, decreased activity tolerance and increased pain.  Ms. Carreira will benefit from skilled PT to  address these deficits and improve her pain and functional ability.    OBJECTIVE IMPAIRMENTS: Abnormal gait, decreased activity tolerance, decreased balance, difficulty walking, decreased ROM, and decreased strength.   ACTIVITY LIMITATIONS: standing, squatting, stairs, and locomotion level  PARTICIPATION LIMITATIONS: cleaning, shopping, community activity, and yard work  PERSONAL FACTORS: Time since onset of injury/illness/exacerbation and 1-2 comorbidities: OA with multiple jt replacements   are also affecting patient's functional outcome.   REHAB POTENTIAL: Good  CLINICAL DECISION MAKING: Stable/uncomplicated  EVALUATION COMPLEXITY: Moderate   GOALS: Goals reviewed with patient? No  SHORT TERM GOALS: Target date: 04/10/23 PT to be I in HEP in order to decrease pain to no greater than a 6/10  Baseline: Goal status: INITIAL  2.  Pt ankle strength to be increased 1/2 grade to be able to be up on RT LE for  20   minutes prior to increased pain  Baseline:  Goal status: INITIAL  3.  Pt to be able to single leg stance for 5 seconds for reduced risk of falling.  Baseline:  Goal status: INITIAL   LONG TERM GOALS: Target date: 05/02/23  PT to be I in an advanced HEP in order to decrease pain to no greater than a 3 to be able to no longer need her cane or ankle brace.   Baseline: Baseline:  Goal status: INITIAL  2.  Pt ankle strength to be 4+/5 to able to be up on RT LE for  60 minutes prior to increased pain to allow pt to complete shopping/housework  Baseline:  Goal status: INITIAL  3.  Pt to be able to single leg stance for 15 seconds for reduced risk of falling. Baseline:  Goal status: INITIAL     PLAN:  PT FREQUENCY: 2x/week  PT DURATION: 6 weeks  PLANNED INTERVENTIONS: Therapeutic exercises, Therapeutic activity, Balance training, Gait training, Patient/Family education, Self Care, and Manual therapy  PLAN FOR NEXT SESSION: Continue POC and may progress as  tolerated with emphasis on ankle strengthening and flexibility as well as hip and knee strengthening to minimize genu valgus.  Measure calf /ankle and give info for elastic therapy for compression garments.  Nelida Meuse PT, DPT Physical Therapist with [facility] [phone] office 04/02/2023, 1:49 PM

## 2023-04-04 ENCOUNTER — Ambulatory Visit (HOSPITAL_COMMUNITY): Payer: Medicare PPO | Admitting: Physical Therapy

## 2023-04-04 DIAGNOSIS — M25571 Pain in right ankle and joints of right foot: Secondary | ICD-10-CM | POA: Diagnosis not present

## 2023-04-04 DIAGNOSIS — R262 Difficulty in walking, not elsewhere classified: Secondary | ICD-10-CM | POA: Diagnosis not present

## 2023-04-04 NOTE — Therapy (Signed)
OUTPATIENT PHYSICAL THERAPY LOWER EXTREMITY TREATMENT   Patient Name: CLAUDELLE SCHOLLE MRN: [id number] DOB:20-Feb-1946, 77 y.o., female Today's Date: 04/04/2023  END OF SESSION:   PT End of Session - 04/04/23 1515    Visit Number 4    Number of Visits 12    Date for PT Re-Evaluation 05/01/23    Authorization Type humana (cohere approved 12 visits from 03/25/23 - 05/07/23)    Authorization Time Period 03/25/23 - 05/07/23    Authorization - Visit Number 4    Authorization - Number of Visits 12    Progress Note Due on Visit 10    PT Start Time 1435    PT Stop Time 1515    PT Time Calculation (min) 40 min    Equipment Utilized During Treatment Gait belt    Activity Tolerance Patient tolerated treatment well    Behavior During Therapy WFL for tasks assessed/performed              Past Medical History:  Diagnosis Date   Anxiety    Arthritis    left hip   Asthma    Inhalers for tx   Bilateral primary osteoarthritis of knee 07/06/2016   Moderate   Bilateral swelling of feet    bilateral ankle and feet edema   Bruises easily    Complication of anesthesia    sometimes needs oxygen when waking uo due to asthma   DDD (degenerative disc disease), lumbar 07/06/2016   Fibromyalgia    GERD (gastroesophageal reflux disease)    Hypertension    Myofacial muscle pain 07/06/2016   Osteoarthritis of both hands 07/06/2016   Pre-diabetes    Seasonal allergies 07/06/2016   Sinus complaint    Sjogren's syndrome (HCC)    dry eyes   Tenonitis    Urinary incontinence    occ. an issue wears pads   Past Surgical History:  Procedure Laterality Date   ABDOMINAL HYSTERECTOMY  1986   BACK SURGERY  1985   BREAST CYST ASPIRATION     BREAST SURGERY Right    cyst drainage   CARPAL TUNNEL RELEASE Right 2001   KNEE SURGERY  1991   REVERSE SHOULDER ARTHROPLASTY Left 03/01/2022   Procedure: REVERSE SHOULDER ARTHROPLASTY;  Surgeon: Francena Hanly, MD;  Location: [facility];  Service: Orthopedics;   Laterality: Left;    SHOULDER SURGERY Right    right shoulder tendon and rotator cuff repair   TOTAL HIP ARTHROPLASTY Right 2006   TOTAL HIP ARTHROPLASTY Left 02/24/2016   Procedure: LEFT TOTAL HIP ARTHROPLASTY ANTERIOR APPROACH;  Surgeon: Kathryne Hitch, MD;  Location: [facility];  Service: Orthopedics;  Laterality: Left;   TOTAL KNEE ARTHROPLASTY Left 11/16/2019   Procedure: TOTAL KNEE ARTHROPLASTY;  Surgeon: Ollen Gross, MD;  Location: [facility];  Service: Orthopedics;  Laterality: Left;    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   Primary osteoarthritis of left knee 11/16/2019   Unilateral primary osteoarthritis, left knee 05/25/2019   Trochanteric bursitis, left hip 03/07/2017   Lymphedema 12/06/2016   Bilateral bunions 08/21/2016   Seasonal allergies 07/06/2016   Asthma 07/06/2016   Sjogren's syndrome (HCC) 07/06/2016   Bilateral primary osteoarthritis of knee 07/06/2016   Osteoarthritis of both hands 07/06/2016   Myofacial muscle pain 07/06/2016   DDD lumbar spine with spinal stenosis 07/06/2016   Abnormality of gait 06/26/2016   Chronic pain of right ankle 06/26/2016   H/O bilateral  hip replacements 02/24/2016   Varicose veins 04/20/2015   Lipidemia 04/20/2015   Rheumatoid factor positive 11/20/2013   Back pain with left-sided sciatica 11/20/2013   HTN (hypertension) 11/20/2013   GERD (gastroesophageal reflux disease) 11/20/2013   Bladder spasm 11/20/2013   Hyperlipidemia 11/20/2013   Bilateral swelling of feet    Bruises easily     PCP: Georgann Housekeeper  REFERRING PROVIDER: Netta Cedars, MD  REFERRING DIAG:  Diagnosis  M25.571 (ICD-10-CM) - Pain in right ankle and joints of right foot    THERAPY DIAG:  Diagnosis  M25.571 (ICD-10-CM) - Pain in right ankle and joints of right foot  Muscle weakness Rt ankle pain  Rationale for Evaluation and Treatment:  Rehabilitation  ONSET DATE: chronic with acute exacerbation on     SUBJECTIVE STATEMENT:  Pt states that she is not as sore as she has been.  She has been completing her exercises.    EVAL: Pt states that she has had ankle pains on and off for years.  This acute episode started about 3 months ago, pt started using a cane since this time.  Pt states new shoes seemed to flare her up.  Pt states that she is only able to be on her feet for about 10 minutes with her cane at this time and then she is very sore.  aPt was given an ankle brace from the ortho MD.  PERTINENT HISTORY: OA, THR B, TSR, TKR Lt  PAIN:  Are you having pain? Yes: NPRS scale: 2/10;  highest is a 10/10; lowest 2 (pt has other pain but we will not address) Pain location: inside of foot.  Pt wears orthotics but she has not had new orthotics for 5 years.  Pain description: burning and sharp  Aggravating factors:wt bearing  Relieving factors: getting off her feet, ice and elevate.    PRECAUTIONS: None  RED FLAGS: None   WEIGHT BEARING RESTRICTIONS: No  FALLS:  Has patient fallen in last 6 months? No  LIVING ENVIRONMENT: Lives with: lives with their family Lives in: House/apartment Stairs: Yes: External: 2 steps; on right going up; goes up step to not reciprocal  Has following equipment at home: Single point cane  OCCUPATION: retired  PLOF: Independent with household mobility with device  PATIENT GOALS: less pain in her Rt ankle , get the strength that she needs back in her ankle, to be able to shop and be on her feet longer to be able to enjoy her life again.    NEXT MD VISIT: 04/27/23  OBJECTIVE:   DIAGNOSTIC FINDINGS: none available for ankle   PATIENT SURVEYS:  FOTO 22  COGNITION: Overall cognitive status: Within functional limits for tasks assessed     EDEMA:  Noted edema in both LE's pt states that the MD wanted therapy to look at this therapist explained that it most likely is lymphedema and we can  see her following ankle rehab for this.     POSTURE: rounded shoulders, decreased lumbar lordosis, and increased thoracic kyphosis  PALPATION: Sore to the touch   LOWER EXTREMITY ROM:  Active ROM Right eval Left eval  Hip flexion    Hip extension    Hip abduction    Hip adduction    Hip internal rotation    Hip external rotation    Knee flexion    Knee extension Lacking 15    Ankle dorsiflexion 4 5  Ankle plantarflexion 45 50  Ankle inversion 12 30  Ankle eversion 10 12   (  Blank rows = not tested)  LOWER EXTREMITY MMT:  MMT Right eval Left eval  Hip flexion    Hip extension    Hip abduction    Hip adduction    Hip internal rotation    Hip external rotation    Knee flexion    Knee extension    Ankle dorsiflexion 4- 4  Ankle plantarflexion 1 4  Ankle inversion 3- 5  Ankle eversion 5 5   (Blank rows = not tested)  FUNCTIONAL TESTS:  30 seconds chair stand test: 4 reps , 6 is poor for age and sex; 32 is average.  2 minute walk test: using a cane 202 ft   Single leg stance: Lt: 10 seconds, Rt: 0"   TODAY'S TREATMENT:                                                                                                                              DATE:  04/04/23 Standing: Heel raise x 10 Squat x 10 Slant board stretch 15" x 5  Single leg stance 5 x each Lt: 14" max Rt one hand hold:  16"  Theraband green for all directions x 10  Manual decongestive techniques for Rt LE.  04/02/2023  -3 x 1' toe curls on towel. -Ankle Inversion 3 x 15 RTB; cues for eccentric control of 2 seconds.  -Seated Ankle PF x 30 with RTB -Seated Ankle DF x 30 with RTB -10x Elevated sit/stands with RTB for hip abduction facilitation; CGA for balance. -cues given for anterior weight shift. Holding yellow weighted ball.    03/28/23 Seated hamstring stretch x 30" x 3 Seated hip abd, YTB x 3" x 10 x 2 Seated R gastrocnemius stretch with a strap x 30" x 3 Seated R Plantar fascia self STM with  a tennis ball x 1' Seated ankle DF/PF and inversion, YTB x 10 x 2 Seated R towel scrunches x 1' Seated ankle alphabets x 1 set  03/20/23:  Evaluation   Ankle alphabet Heel raises x 10 Quad set x 10     PATIENT EDUCATION:  Education details: HEP Person educated: Patient Education method: Programmer, multimedia, Verbal cues, and Handouts Education comprehension: returned demonstration  HOME EXERCISE PROGRAM: Access Code: [email or web address] URL: [email or web address] Date: 03/28/2023 Prepared by: Krystal Clark  Exercises - Seated Hamstring Stretch  - 1-2 x daily - 7 x weekly - 3 reps - 30 hold - Seated Calf Stretch with Strap  - 1-2 x daily - 7 x weekly - 3 reps - 30 hold - Seated Plantar Fascia Mobilization with Small Ball  - 1-2 x daily - 7 x weekly - Ankle and Toe Plantarflexion with Resistance  - 1-2 x daily - 7 x weekly - 2 sets - 10 reps - Ankle Dorsiflexion with Resistance  - 1-2 x daily - 7 x weekly - 2 sets - 10 reps - Seated Ankle Inversion with Resistance and Legs Crossed  - 1-2 x daily -  7 x weekly - 2 sets - 10 reps - Towel Scrunches  - 1-2 x daily - 7 x weekly - Seated Hip Abduction with Resistance  - 1-2 x daily - 7 x weekly - 2 sets - 10 reps - 3 hold  - Supine Quadricep Sets  - 10 x daily - 7 x weekly - 1 sets - 10 reps - Seated Ankle Alphabet  - 3 x daily - 7 x weekly - 1 sets - 1 reps - Heel Raises with Counter Support  - 3 x daily - 7 x weekly - 3 sets - 10 reps  ASSESSMENT:  CLINICAL IMPRESSION: PT continues to have significant edema in her ankle, decongestive techniques completed to decrease swelling.  Therapist measured LE for compression garments and gave information for elastic therapy.  PT continues to be limited due to pain and weakness and will continue to benefit from skilled PT services to address pain and function.   EVAL: Patient is a 77 y.o. female who was seen today for physical therapy evaluation and treatment for Rt ankle pain. Pt evaluation  demonstrates decreased ROM, decreased strength, decreased balance, decreased activity tolerance and increased pain.  Ms. Leano will benefit from skilled PT to address these deficits and improve her pain and functional ability.    OBJECTIVE IMPAIRMENTS: Abnormal gait, decreased activity tolerance, decreased balance, difficulty walking, decreased ROM, and decreased strength.   ACTIVITY LIMITATIONS: standing, squatting, stairs, and locomotion level  PARTICIPATION LIMITATIONS: cleaning, shopping, community activity, and yard work  PERSONAL FACTORS: Time since onset of injury/illness/exacerbation and 1-2 comorbidities: OA with multiple jt replacements   are also affecting patient's functional outcome.   REHAB POTENTIAL: Good  CLINICAL DECISION MAKING: Stable/uncomplicated  EVALUATION COMPLEXITY: Moderate   GOALS: Goals reviewed with patient? No  SHORT TERM GOALS: Target date: 04/10/23 PT to be I in HEP in order to decrease pain to no greater than a 6/10  Baseline: Goal status: on-going   2.  Pt ankle strength to be increased 1/2 grade to be able to be up on RT LE for  20   minutes prior to increased pain  Baseline:  Goal status: on-going  3.  Pt to be able to single leg stance for 5 seconds for reduced risk of falling.  Baseline:  Goal status:on-going  LONG TERM GOALS: Target date: 05/02/23  PT to be I in an advanced HEP in order to decrease pain to no greater than a 3 to be able to no longer need her cane or ankle brace.   Baseline: Baseline:  Goal status: on-going  2.  Pt ankle strength to be 4+/5 to able to be up on RT LE for  60 minutes prior to increased pain to allow pt to complete shopping/housework  Baseline:  Goal status:on-going  3.  Pt to be able to single leg stance for 15 seconds for reduced risk of falling. Baseline:  Goal status: on-going     PLAN:  PT FREQUENCY: 2x/week  PT DURATION: 6 weeks  PLANNED INTERVENTIONS: Therapeutic exercises, Therapeutic  activity, Balance training, Gait training, Patient/Family education, Self Care, and Manual therapy  PLAN FOR NEXT SESSION: Continue POC and may progress as tolerated with emphasis on ankle strengthening and flexibility as well as hip and knee strengthening to minimize genu valgus.  Virgina Organ, PT CLT [phone]

## 2023-04-09 ENCOUNTER — Ambulatory Visit (HOSPITAL_COMMUNITY): Payer: Medicare PPO | Admitting: Physical Therapy

## 2023-04-09 DIAGNOSIS — R262 Difficulty in walking, not elsewhere classified: Secondary | ICD-10-CM | POA: Diagnosis not present

## 2023-04-09 DIAGNOSIS — M25571 Pain in right ankle and joints of right foot: Secondary | ICD-10-CM

## 2023-04-09 NOTE — Therapy (Signed)
OUTPATIENT PHYSICAL THERAPY LOWER EXTREMITY TREATMENT   Patient Name: Kathy Duran MRN: [id number] DOB:10/23/1945, 77 y.o., female Today's Date: 04/09/2023  END OF SESSION:  PT End of Session - 04/09/23 1435     Visit Number 5    Number of Visits 12    Date for PT Re-Evaluation 05/01/23    Authorization Type [employer] ([employer] approved 12 visits from 03/25/23 - 05/07/23)    Authorization Time Period 03/25/23 - 05/07/23    Authorization - Visit Number 5    Authorization - Number of Visits 12    Progress Note Due on Visit 10    PT Start Time 1343    PT Stop Time 1433    PT Time Calculation (min) 50 min    Equipment Utilized During Treatment Gait belt    Activity Tolerance Patient tolerated treatment well    Behavior During Therapy WFL for tasks assessed/performed                   Past Medical History:  Diagnosis Date   Anxiety    Arthritis    left hip   Asthma    Inhalers for tx   Bilateral primary osteoarthritis of knee 07/06/2016   Moderate   Bilateral swelling of feet    bilateral ankle and feet edema   Bruises easily    Complication of anesthesia    sometimes needs oxygen when waking uo due to asthma   DDD (degenerative disc disease), lumbar 07/06/2016   Fibromyalgia    GERD (gastroesophageal reflux disease)    Hypertension    Myofacial muscle pain 07/06/2016   Osteoarthritis of both hands 07/06/2016   Pre-diabetes    Seasonal allergies 07/06/2016   Sinus complaint    Sjogren's syndrome (HCC)    dry eyes   Tenonitis    Urinary incontinence    occ. an issue wears pads   Past Surgical History:  Procedure Laterality Date   ABDOMINAL HYSTERECTOMY  1986   BACK SURGERY  1985   BREAST CYST ASPIRATION     BREAST SURGERY Right    cyst drainage   CARPAL TUNNEL RELEASE Right 2001   KNEE SURGERY  1991   REVERSE SHOULDER ARTHROPLASTY Left 03/01/2022   Procedure: REVERSE SHOULDER ARTHROPLASTY;  Surgeon: Francena Hanly, MD;  Location: [facility];  Service:  Orthopedics;  Laterality: Left;    SHOULDER SURGERY Right    right shoulder tendon and rotator cuff repair   TOTAL HIP ARTHROPLASTY Right 2006   TOTAL HIP ARTHROPLASTY Left 02/24/2016   Procedure: LEFT TOTAL HIP ARTHROPLASTY ANTERIOR APPROACH;  Surgeon: Kathryne Hitch, MD;  Location: [facility];  Service: Orthopedics;  Laterality: Left;   TOTAL KNEE ARTHROPLASTY Left 11/16/2019   Procedure: TOTAL KNEE ARTHROPLASTY;  Surgeon: Ollen Gross, MD;  Location: [facility];  Service: Orthopedics;  Laterality: Left;    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   Primary osteoarthritis of left knee 11/16/2019   Unilateral primary osteoarthritis, left knee 05/25/2019   Trochanteric bursitis, left hip 03/07/2017   Lymphedema 12/06/2016   Bilateral bunions 08/21/2016   Seasonal allergies 07/06/2016   Asthma 07/06/2016   Sjogren's syndrome (HCC) 07/06/2016   Bilateral primary osteoarthritis of knee 07/06/2016   Osteoarthritis of both hands 07/06/2016   Myofacial muscle pain 07/06/2016   DDD lumbar spine with spinal stenosis 07/06/2016   Abnormality of gait 06/26/2016   Chronic pain of right ankle  06/26/2016   H/O bilateral hip replacements 02/24/2016   Varicose veins 04/20/2015   Lipidemia 04/20/2015   Rheumatoid factor positive 11/20/2013   Back pain with left-sided sciatica 11/20/2013   HTN (hypertension) 11/20/2013   GERD (gastroesophageal reflux disease) 11/20/2013   Bladder spasm 11/20/2013   Hyperlipidemia 11/20/2013   Bilateral swelling of feet    Bruises easily     PCP: Georgann Housekeeper  REFERRING PROVIDER: Netta Cedars, MD  REFERRING DIAG:  Diagnosis  M25.571 (ICD-10-CM) - Pain in right ankle and joints of right foot    THERAPY DIAG:  Diagnosis  M25.571 (ICD-10-CM) - Pain in right ankle and joints of right foot  Muscle weakness Rt ankle pain  Rationale for Evaluation and Treatment:  Rehabilitation  ONSET DATE: chronic with acute exacerbation on     SUBJECTIVE STATEMENT:  Pt states that she is not as sore as she has been.  She got her compression socks  EVAL: Pt states that she has had ankle pains on and off for years.  This acute episode started about 3 months ago, pt started using a cane since this time.  Pt states new shoes seemed to flare her up.  Pt states that she is only able to be on her feet for about 10 minutes with her cane at this time and then she is very sore.  aPt was given an ankle brace from the ortho MD.  PERTINENT HISTORY: OA, THR B, TSR, TKR Lt  PAIN:  Are you having pain? Yes: NPRS scale: 2/10;  highest is a 10/10; lowest 2 (pt has other pain but we will not address) Pain location: inside of foot.  Pt wears orthotics but she has not had new orthotics for 5 years.  Pain description: burning and sharp  Aggravating factors:wt bearing  Relieving factors: getting off her feet, ice and elevate.    PRECAUTIONS: None  RED FLAGS: None   WEIGHT BEARING RESTRICTIONS: No  FALLS:  Has patient fallen in last 6 months? No  LIVING ENVIRONMENT: Lives with: lives with their family Lives in: House/apartment Stairs: Yes: External: 2 steps; on right going up; goes up step to not reciprocal  Has following equipment at home: Single point cane  OCCUPATION: retired  PLOF: Independent with household mobility with device  PATIENT GOALS: less pain in her Rt ankle , get the strength that she needs back in her ankle, to be able to shop and be on her feet longer to be able to enjoy her life again.    NEXT MD VISIT: 04/27/23  OBJECTIVE:   DIAGNOSTIC FINDINGS: none available for ankle   PATIENT SURVEYS:  FOTO 22  COGNITION: Overall cognitive status: Within functional limits for tasks assessed     EDEMA:  Noted edema in both LE's pt states that the MD wanted therapy to look at this therapist explained that it most likely is lymphedema and we can see her  following ankle rehab for this.     POSTURE: rounded shoulders, decreased lumbar lordosis, and increased thoracic kyphosis  PALPATION: Sore to the touch   LOWER EXTREMITY ROM:  Active ROM Right eval Left eval  Hip flexion    Hip extension    Hip abduction    Hip adduction    Hip internal rotation    Hip external rotation    Knee flexion    Knee extension Lacking 15    Ankle dorsiflexion 4 5  Ankle plantarflexion 45 50  Ankle inversion 12 30  Ankle eversion  10 12   (Blank rows = not tested)  LOWER EXTREMITY MMT:  MMT Right eval Left eval  Hip flexion    Hip extension    Hip abduction    Hip adduction    Hip internal rotation    Hip external rotation    Knee flexion    Knee extension    Ankle dorsiflexion 4- 4  Ankle plantarflexion 1 4  Ankle inversion 3- 5  Ankle eversion 5 5   (Blank rows = not tested)  FUNCTIONAL TESTS:  30 seconds chair stand test: 4 reps , 6 is poor for age and sex; 59 is average.  2 minute walk test: using a cane 202 ft   Single leg stance: Lt: 10 seconds, Rt: 0"   TODAY'S TREATMENT:                                                                                                                              DATE:  04/09/2023 Heel raise x 10 Squat x 10 Slant board stretch 15" x 5  Tandem stance with head turns  B x 5  4" step up x 10 B Toe raises x 15  Sitting : Baps level 2: 10 x ant/post, 10 x medial/lateral and 5 round the clock and counter clockwise Sit to stand x 10  Manual decongestive techniques for Rt LE.    04/04/23 Standing: Heel raise x 10 Squat x 10 Slant board stretch 15" x 5  Single leg stance 5 x each Lt: 14" max Rt one hand hold:  16"  Theraband green for all directions x 10  Manual decongestive techniques for Rt LE.  04/02/2023  -3 x 1' toe curls on towel. -Ankle Inversion 3 x 15 RTB; cues for eccentric control of 2 seconds.  -Seated Ankle PF x 30 with RTB -Seated Ankle DF x 30 with RTB -10x Elevated  sit/stands with RTB for hip abduction facilitation; CGA for balance. -cues given for anterior weight shift. Holding yellow weighted ball.    03/28/23 Seated hamstring stretch x 30" x 3 Seated hip abd, YTB x 3" x 10 x 2 Seated R gastrocnemius stretch with a strap x 30" x 3 Seated R Plantar fascia self STM with a tennis ball x 1' Seated ankle DF/PF and inversion, YTB x 10 x 2 Seated R towel scrunches x 1' Seated ankle alphabets x 1 set  03/20/23:  Evaluation   Ankle alphabet Heel raises x 10 Quad set x 10     PATIENT EDUCATION:  Education details: HEP Person educated: Patient Education method: Programmer, multimedia, Verbal cues, and Handouts Education comprehension: returned demonstration  HOME EXERCISE PROGRAM: Access Code: [email or web address] URL: [email or web address] Date: 03/28/2023 Prepared by: Krystal Clark  Exercises - Seated Hamstring Stretch  - 1-2 x daily - 7 x weekly - 3 reps - 30 hold - Seated Calf Stretch with Strap  - 1-2 x daily - 7 x weekly - 3 reps - 30  hold - Seated Plantar Fascia Mobilization with Small Ball  - 1-2 x daily - 7 x weekly - Ankle and Toe Plantarflexion with Resistance  - 1-2 x daily - 7 x weekly - 2 sets - 10 reps - Ankle Dorsiflexion with Resistance  - 1-2 x daily - 7 x weekly - 2 sets - 10 reps - Seated Ankle Inversion with Resistance and Legs Crossed  - 1-2 x daily - 7 x weekly - 2 sets - 10 reps - Towel Scrunches  - 1-2 x daily - 7 x weekly - Seated Hip Abduction with Resistance  - 1-2 x daily - 7 x weekly - 2 sets - 10 reps - 3 hold  - Supine Quadricep Sets  - 10 x daily - 7 x weekly - 1 sets - 10 reps - Seated Ankle Alphabet  - 3 x daily - 7 x weekly - 1 sets - 1 reps - Heel Raises with Counter Support  - 3 x daily - 7 x weekly - 3 sets - 10 reps  ASSESSMENT:  CLINICAL IMPRESSION: Pt fatigued with today's treatment needing to take several rest breaks.  Added several exercises for overall improved strength and endurance.    PT continues to  be limited due to pain and weakness and will continue to benefit from skilled PT services to address pain and function.   EVAL: Patient is a 77 y.o. female who was seen today for physical therapy evaluation and treatment for Rt ankle pain. Pt evaluation demonstrates decreased ROM, decreased strength, decreased balance, decreased activity tolerance and increased pain.  Ms. Desorbo will benefit from skilled PT to address these deficits and improve her pain and functional ability.    OBJECTIVE IMPAIRMENTS: Abnormal gait, decreased activity tolerance, decreased balance, difficulty walking, decreased ROM, and decreased strength.   ACTIVITY LIMITATIONS: standing, squatting, stairs, and locomotion level  PARTICIPATION LIMITATIONS: cleaning, shopping, community activity, and yard work  PERSONAL FACTORS: Time since onset of injury/illness/exacerbation and 1-2 comorbidities: OA with multiple jt replacements   are also affecting patient's functional outcome.   REHAB POTENTIAL: Good  CLINICAL DECISION MAKING: Stable/uncomplicated  EVALUATION COMPLEXITY: Moderate   GOALS: Goals reviewed with patient? No  SHORT TERM GOALS: Target date: 04/10/23 PT to be I in HEP in order to decrease pain to no greater than a 6/10  Baseline: Goal status: on-going   2.  Pt ankle strength to be increased 1/2 grade to be able to be up on RT LE for  20   minutes prior to increased pain  Baseline:  Goal status: on-going  3.  Pt to be able to single leg stance for 5 seconds for reduced risk of falling.  Baseline:  Goal status:on-going  LONG TERM GOALS: Target date: 05/02/23  PT to be I in an advanced HEP in order to decrease pain to no greater than a 3 to be able to no longer need her cane or ankle brace.   Baseline: Baseline:  Goal status: on-going  2.  Pt ankle strength to be 4+/5 to able to be up on RT LE for  60 minutes prior to increased pain to allow pt to complete shopping/housework  Baseline:  Goal  status:on-going  3.  Pt to be able to single leg stance for 15 seconds for reduced risk of falling. Baseline:  Goal status: on-going     PLAN:  PT FREQUENCY: 2x/week  PT DURATION: 6 weeks  PLANNED INTERVENTIONS: Therapeutic exercises, Therapeutic activity, Balance training, Gait training, Patient/Family  education, Self Care, and Manual therapy  PLAN FOR NEXT SESSION: Continue POC and may progress as tolerated with emphasis on ankle strengthening and flexibility as well as hip and knee strengthening to minimize genu valgus.  Virgina Organ, PT CLT [phone]

## 2023-04-11 ENCOUNTER — Encounter (HOSPITAL_COMMUNITY): Payer: Medicare PPO

## 2023-04-15 DIAGNOSIS — M059 Rheumatoid arthritis with rheumatoid factor, unspecified: Secondary | ICD-10-CM | POA: Diagnosis not present

## 2023-04-16 ENCOUNTER — Ambulatory Visit (HOSPITAL_COMMUNITY): Payer: Medicare PPO

## 2023-04-16 DIAGNOSIS — R262 Difficulty in walking, not elsewhere classified: Secondary | ICD-10-CM | POA: Diagnosis not present

## 2023-04-16 DIAGNOSIS — M25571 Pain in right ankle and joints of right foot: Secondary | ICD-10-CM | POA: Diagnosis not present

## 2023-04-16 NOTE — Therapy (Signed)
OUTPATIENT PHYSICAL THERAPY LOWER EXTREMITY TREATMENT   Patient Name: Kathy Duran MRN: [id number] DOB:07/19/1946, 77 y.o., female Today's Date: 04/16/2023  END OF SESSION:  PT End of Session - 04/16/23 1109     Visit Number 6    Number of Visits 12    Date for PT Re-Evaluation 05/01/23    Authorization Type [employer] (co[employer] approved 12 visits from 03/25/23 - 05/07/23)    Authorization Time Period 03/25/23 - 05/07/23    Authorization - Visit Number 6    Authorization - Number of Visits 12    Progress Note Due on Visit 10    PT Start Time 1100    PT Stop Time 1140    PT Time Calculation (min) 40 min    Activity Tolerance Patient tolerated treatment well    Behavior During Therapy [facility] for tasks assessed/performed                  Past Medical History:  Diagnosis Date   Anxiety    Arthritis    left hip   Asthma    Inhalers for tx   Bilateral primary osteoarthritis of knee 07/06/2016   Moderate   Bilateral swelling of feet    bilateral ankle and feet edema   Bruises easily    Complication of anesthesia    sometimes needs oxygen when waking uo due to asthma   DDD (degenerative disc disease), lumbar 07/06/2016   Fibromyalgia    GERD (gastroesophageal reflux disease)    Hypertension    Myofacial muscle pain 07/06/2016   Osteoarthritis of both hands 07/06/2016   Pre-diabetes    Seasonal allergies 07/06/2016   Sinus complaint    Sjogren's syndrome (HCC)    dry eyes   Tenonitis    Urinary incontinence    occ. an issue wears pads   Past Surgical History:  Procedure Laterality Date   ABDOMINAL HYSTERECTOMY  1986   BACK SURGERY  1985   BREAST CYST ASPIRATION     BREAST SURGERY Right    cyst drainage   CARPAL TUNNEL RELEASE Right 2001   KNEE SURGERY  1991   REVERSE SHOULDER ARTHROPLASTY Left 03/01/2022   Procedure: REVERSE SHOULDER ARTHROPLASTY;  Surgeon: Francena Hanly, MD;  Location: [facility];  Service: Orthopedics;  Laterality: Left;    SHOULDER SURGERY  Right    right shoulder tendon and rotator cuff repair   TOTAL HIP ARTHROPLASTY Right 2006   TOTAL HIP ARTHROPLASTY Left 02/24/2016   Procedure: LEFT TOTAL HIP ARTHROPLASTY ANTERIOR APPROACH;  Surgeon: Kathryne Hitch, MD;  Location: [facility];  Service: Orthopedics;  Laterality: Left;   TOTAL KNEE ARTHROPLASTY Left 11/16/2019   Procedure: TOTAL KNEE ARTHROPLASTY;  Surgeon: Ollen Gross, MD;  Location: [facility];  Service: Orthopedics;  Laterality: Left;    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   Primary osteoarthritis of left knee 11/16/2019   Unilateral primary osteoarthritis, left knee 05/25/2019   Trochanteric bursitis, left hip 03/07/2017   Lymphedema 12/06/2016   Bilateral bunions 08/21/2016   Seasonal allergies 07/06/2016   Asthma 07/06/2016   Sjogren's syndrome (HCC) 07/06/2016   Bilateral primary osteoarthritis of knee 07/06/2016   Osteoarthritis of both hands 07/06/2016   Myofacial muscle pain 07/06/2016   DDD lumbar spine with spinal stenosis 07/06/2016   Abnormality of gait 06/26/2016   Chronic pain of right ankle 06/26/2016   H/O bilateral hip replacements 02/24/2016  Varicose veins 04/20/2015   Lipidemia 04/20/2015   Rheumatoid factor positive 11/20/2013   Back pain with left-sided sciatica 11/20/2013   HTN (hypertension) 11/20/2013   GERD (gastroesophageal reflux disease) 11/20/2013   Bladder spasm 11/20/2013   Hyperlipidemia 11/20/2013   Bilateral swelling of feet    Bruises easily     PCP: Georgann Housekeeper  REFERRING PROVIDER: Netta Cedars, MD  REFERRING DIAG:  Diagnosis  M25.571 (ICD-10-CM) - Pain in right ankle and joints of right foot    THERAPY DIAG:  Diagnosis  M25.571 (ICD-10-CM) - Pain in right ankle and joints of right foot  Muscle weakness Rt ankle pain  Rationale for Evaluation and Treatment: Rehabilitation  ONSET DATE: chronic with acute exacerbation on      SUBJECTIVE STATEMENT:  Patient states that pain is 30% less compared to the first time she was in the clinic for PT.Marland Kitchen Patient states that she can put weight on the R LE now without a lot of issues and is now able to stand for prolonged periods. Patient denies pain at the moment.  EVAL: Pt states that she has had ankle pains on and off for years.  This acute episode started about 3 months ago, pt started using a cane since this time.  Pt states new shoes seemed to flare her up.  Pt states that she is only able to be on her feet for about 10 minutes with her cane at this time and then she is very sore.  aPt was given an ankle brace from the ortho MD.  PERTINENT HISTORY: OA, THR B, TSR, TKR Lt  PAIN:  Are you having pain? Yes: NPRS scale: 2/10;  highest is a 10/10; lowest 2 (pt has other pain but we will not address) Pain location: inside of foot.  Pt wears orthotics but she has not had new orthotics for 5 years.  Pain description: burning and sharp  Aggravating factors:wt bearing  Relieving factors: getting off her feet, ice and elevate.    PRECAUTIONS: None  RED FLAGS: None   WEIGHT BEARING RESTRICTIONS: No  FALLS:  Has patient fallen in last 6 months? No  LIVING ENVIRONMENT: Lives with: lives with their family Lives in: House/apartment Stairs: Yes: External: 2 steps; on right going up; goes up step to not reciprocal  Has following equipment at home: Single point cane  OCCUPATION: retired  PLOF: Independent with household mobility with device  PATIENT GOALS: less pain in her Rt ankle , get the strength that she needs back in her ankle, to be able to shop and be on her feet longer to be able to enjoy her life again.    NEXT MD VISIT: 04/27/23  OBJECTIVE:   DIAGNOSTIC FINDINGS: none available for ankle   PATIENT SURVEYS:  FOTO 22  COGNITION: Overall cognitive status: Within functional limits for tasks assessed     EDEMA:  Noted edema in both LE's pt states that the  MD wanted therapy to look at this therapist explained that it most likely is lymphedema and we can see her following ankle rehab for this.     POSTURE: rounded shoulders, decreased lumbar lordosis, and increased thoracic kyphosis  PALPATION: Sore to the touch   LOWER EXTREMITY ROM:  Active ROM Right eval Left eval  Hip flexion    Hip extension    Hip abduction    Hip adduction    Hip internal rotation    Hip external rotation    Knee flexion    Knee  extension Lacking 15    Ankle dorsiflexion 4 5  Ankle plantarflexion 45 50  Ankle inversion 12 30  Ankle eversion 10 12   (Blank rows = not tested)  LOWER EXTREMITY MMT:  MMT Right eval Left eval  Hip flexion    Hip extension    Hip abduction    Hip adduction    Hip internal rotation    Hip external rotation    Knee flexion    Knee extension    Ankle dorsiflexion 4- 4  Ankle plantarflexion 1 4  Ankle inversion 3- 5  Ankle eversion 5 5   (Blank rows = not tested)  FUNCTIONAL TESTS:  30 seconds chair stand test: 4 reps , 6 is poor for age and sex; 83 is average.  2 minute walk test: using a cane 202 ft   Single leg stance: Lt: 10 seconds, Rt: 0"   TODAY'S TREATMENT:                                                                                                                              DATE:  04/16/23 NuStep, level 1, seat 10 x 5', >70 SPM Gastrocnemius slant board stretch x 30" x 3 Mini squats with hip abd, RTB x 3" x 10 x 2 R lunges with medial pull from RTB on knee x 3" x 10 x 2 Sidestepping with RTB on ankles x 3 rounds inside // bars Heel raises x 10 x 2 Standing toe yoga x 10 Seated B hamstring stretch x 30" x 3  04/09/2023 Heel raise x 10 Squat x 10 Slant board stretch 15" x 5  Tandem stance with head turns  B x 5  4" step up x 10 B Toe raises x 15  Sitting : Baps level 2: 10 x ant/post, 10 x medial/lateral and 5 round the clock and counter clockwise Sit to stand x 10  Manual decongestive  techniques for Rt LE.    04/04/23 Standing: Heel raise x 10 Squat x 10 Slant board stretch 15" x 5  Single leg stance 5 x each Lt: 14" max Rt one hand hold:  16"  Theraband green for all directions x 10  Manual decongestive techniques for Rt LE.  04/02/2023  -3 x 1' toe curls on towel. -Ankle Inversion 3 x 15 RTB; cues for eccentric control of 2 seconds.  -Seated Ankle PF x 30 with RTB -Seated Ankle DF x 30 with RTB -10x Elevated sit/stands with RTB for hip abduction facilitation; CGA for balance. -cues given for anterior weight shift. Holding yellow weighted ball.    03/28/23 Seated hamstring stretch x 30" x 3 Seated hip abd, YTB x 3" x 10 x 2 Seated R gastrocnemius stretch with a strap x 30" x 3 Seated R Plantar fascia self STM with a tennis ball x 1' Seated ankle DF/PF and inversion, YTB x 10 x 2 Seated R towel scrunches x 1' Seated ankle alphabets  x 1 set  03/20/23:  Evaluation   Ankle alphabet Heel raises x 10 Quad set x 10     PATIENT EDUCATION:  Education details: HEP Person educated: Patient Education method: Programmer, multimedia, Verbal cues, and Handouts Education comprehension: returned demonstration  HOME EXERCISE PROGRAM: Access Code: [email or web address] URL: [email or web address] 04/16/23 - Mini Squat with Counter Support  - 1-2 x daily - 7 x weekly - 2 sets - 10 reps - 3 hold - Side Stepping with Resistance at Ankles and Counter Support  - 1-2 x daily - 7 x weekly - 5 sets - Toe Yoga - Alternating Great Toe and Lesser Toe Extension  - 1 x daily - 7 x weekly - 2 sets - 10 reps  Date: 03/28/2023 Prepared by: Krystal Clark  Exercises - Seated Hamstring Stretch  - 1-2 x daily - 7 x weekly - 3 reps - 30 hold - Seated Calf Stretch with Strap  - 1-2 x daily - 7 x weekly - 3 reps - 30 hold - Seated Plantar Fascia Mobilization with Small Ball  - 1-2 x daily - 7 x weekly - Ankle and Toe Plantarflexion with Resistance  - 1-2 x daily - 7 x weekly - 2 sets - 10 reps -  Ankle Dorsiflexion with Resistance  - 1-2 x daily - 7 x weekly - 2 sets - 10 reps - Seated Ankle Inversion with Resistance and Legs Crossed  - 1-2 x daily - 7 x weekly - 2 sets - 10 reps - Towel Scrunches  - 1-2 x daily - 7 x weekly - Seated Hip Abduction with Resistance  - 1-2 x daily - 7 x weekly - 2 sets - 10 reps - 3 hold  - Supine Quadricep Sets  - 10 x daily - 7 x weekly - 1 sets - 10 reps - Seated Ankle Alphabet  - 3 x daily - 7 x weekly - 1 sets - 1 reps - Heel Raises with Counter Support  - 3 x daily - 7 x weekly - 3 sets - 10 reps  ASSESSMENT:  CLINICAL IMPRESSION: Interventions today were geared towards LE strengthening and flexibility. Tolerated all activities without worsening of symptoms. Mild difficulty with toe yoga due to weakness of foot intrinsics. Demonstrated mild levels of fatigue. Rest periods given. Provided slight to mild amount of cueing to ensure correct execution of activity with fair to good carry-over. To date, skilled PT is required to address the impairments and improve function.   EVAL: Patient is a 77 y.o. female who was seen today for physical therapy evaluation and treatment for Rt ankle pain. Pt evaluation demonstrates decreased ROM, decreased strength, decreased balance, decreased activity tolerance and increased pain.  Ms. Musacchia will benefit from skilled PT to address these deficits and improve her pain and functional ability.    OBJECTIVE IMPAIRMENTS: Abnormal gait, decreased activity tolerance, decreased balance, difficulty walking, decreased ROM, and decreased strength.   ACTIVITY LIMITATIONS: standing, squatting, stairs, and locomotion level  PARTICIPATION LIMITATIONS: cleaning, shopping, community activity, and yard work  PERSONAL FACTORS: Time since onset of injury/illness/exacerbation and 1-2 comorbidities: OA with multiple jt replacements   are also affecting patient's functional outcome.   REHAB POTENTIAL: Good  CLINICAL DECISION MAKING:  Stable/uncomplicated  EVALUATION COMPLEXITY: Moderate   GOALS: Goals reviewed with patient? No  SHORT TERM GOALS: Target date: 04/10/23 PT to be I in HEP in order to decrease pain to no greater than a 6/10  Baseline: Goal status: on-going   2.  Pt ankle strength to be increased 1/2 grade to be able to be up on RT LE for  20   minutes prior to increased pain  Baseline:  Goal status: on-going  3.  Pt to be able to single leg stance for 5 seconds for reduced risk of falling.  Baseline:  Goal status:on-going  LONG TERM GOALS: Target date: 05/02/23  PT to be I in an advanced HEP in order to decrease pain to no greater than a 3 to be able to no longer need her cane or ankle brace.   Baseline: Baseline:  Goal status: on-going  2.  Pt ankle strength to be 4+/5 to able to be up on RT LE for  60 minutes prior to increased pain to allow pt to complete shopping/housework  Baseline:  Goal status:on-going  3.  Pt to be able to single leg stance for 15 seconds for reduced risk of falling. Baseline:  Goal status: on-going     PLAN:  PT FREQUENCY: 2x/week  PT DURATION: 6 weeks  PLANNED INTERVENTIONS: Therapeutic exercises, Therapeutic activity, Balance training, Gait training, Patient/Family education, Self Care, and Manual therapy  PLAN FOR NEXT SESSION: Continue POC and may progress as tolerated with emphasis on ankle strengthening and flexibility as well as hip and knee strengthening to minimize genu valgus.   Tish Frederickson. Meziah Blasingame, PT, DPT, OCS Board-Certified Clinical Specialist in Orthopedic PT PT Compact Privilege # ([place]): [phone]

## 2023-04-18 ENCOUNTER — Encounter (HOSPITAL_COMMUNITY): Payer: Self-pay | Admitting: Physical Therapy

## 2023-04-18 ENCOUNTER — Ambulatory Visit (HOSPITAL_COMMUNITY): Payer: Medicare PPO | Admitting: Physical Therapy

## 2023-04-18 DIAGNOSIS — R262 Difficulty in walking, not elsewhere classified: Secondary | ICD-10-CM

## 2023-04-18 DIAGNOSIS — M25571 Pain in right ankle and joints of right foot: Secondary | ICD-10-CM | POA: Diagnosis not present

## 2023-04-18 NOTE — Therapy (Signed)
OUTPATIENT PHYSICAL THERAPY LOWER EXTREMITY TREATMENT   Patient Name: Kathy Duran MRN: [id number] DOB:April 21, 1946, 77 y.o., female Today's Date: 04/18/2023  END OF SESSION:  PT End of Session - 04/18/23 1100     Visit Number 7    Number of Visits 12    Date for PT Re-Evaluation 05/01/23    Authorization Type [employer] ([employer] approved 12 visits from 03/25/23 - 05/07/23)    Authorization Time Period 03/25/23 - 05/07/23    Authorization - Visit Number 7    Authorization - Number of Visits 12    Progress Note Due on Visit 10    PT Start Time 1100    PT Stop Time 1140    PT Time Calculation (min) 40 min    Activity Tolerance Patient tolerated treatment well    Behavior During Therapy [facility] for tasks assessed/performed              Past Medical History:  Diagnosis Date   Anxiety    Arthritis    left hip   Asthma    Inhalers for tx   Bilateral primary osteoarthritis of knee 07/06/2016   Moderate   Bilateral swelling of feet    bilateral ankle and feet edema   Bruises easily    Complication of anesthesia    sometimes needs oxygen when waking uo due to asthma   DDD (degenerative disc disease), lumbar 07/06/2016   Fibromyalgia    GERD (gastroesophageal reflux disease)    Hypertension    Myofacial muscle pain 07/06/2016   Osteoarthritis of both hands 07/06/2016   Pre-diabetes    Seasonal allergies 07/06/2016   Sinus complaint    Sjogren's syndrome (HCC)    dry eyes   Tenonitis    Urinary incontinence    occ. an issue wears pads   Past Surgical History:  Procedure Laterality Date   ABDOMINAL HYSTERECTOMY  1986   BACK SURGERY  1985   BREAST CYST ASPIRATION     BREAST SURGERY Right    cyst drainage   CARPAL TUNNEL RELEASE Right 2001   KNEE SURGERY  1991   REVERSE SHOULDER ARTHROPLASTY Left 03/01/2022   Procedure: REVERSE SHOULDER ARTHROPLASTY;  Surgeon: Francena Hanly, MD;  Location: [facility];  Service: Orthopedics;  Laterality: Left;    SHOULDER SURGERY Right     right shoulder tendon and rotator cuff repair   TOTAL HIP ARTHROPLASTY Right 2006   TOTAL HIP ARTHROPLASTY Left 02/24/2016   Procedure: LEFT TOTAL HIP ARTHROPLASTY ANTERIOR APPROACH;  Surgeon: Kathryne Hitch, MD;  Location: [facility];  Service: Orthopedics;  Laterality: Left;   TOTAL KNEE ARTHROPLASTY Left 11/16/2019   Procedure: TOTAL KNEE ARTHROPLASTY;  Surgeon: Ollen Gross, MD;  Location: [facility];  Service: Orthopedics;  Laterality: Left;    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   Primary osteoarthritis of left knee 11/16/2019   Unilateral primary osteoarthritis, left knee 05/25/2019   Trochanteric bursitis, left hip 03/07/2017   Lymphedema 12/06/2016   Bilateral bunions 08/21/2016   Seasonal allergies 07/06/2016   Asthma 07/06/2016   Sjogren's syndrome (HCC) 07/06/2016   Bilateral primary osteoarthritis of knee 07/06/2016   Osteoarthritis of both hands 07/06/2016   Myofacial muscle pain 07/06/2016   DDD lumbar spine with spinal stenosis 07/06/2016   Abnormality of gait 06/26/2016   Chronic pain of right ankle 06/26/2016   H/O bilateral hip replacements 02/24/2016   Varicose veins 04/20/2015  Lipidemia 04/20/2015   Rheumatoid factor positive 11/20/2013   Back pain with left-sided sciatica 11/20/2013   HTN (hypertension) 11/20/2013   GERD (gastroesophageal reflux disease) 11/20/2013   Bladder spasm 11/20/2013   Hyperlipidemia 11/20/2013   Bilateral swelling of feet    Bruises easily     PCP: Georgann Housekeeper  REFERRING PROVIDER: Netta Cedars, MD  REFERRING DIAG:  Diagnosis  M25.571 (ICD-10-CM) - Pain in right ankle and joints of right foot    THERAPY DIAG:  Diagnosis  M25.571 (ICD-10-CM) - Pain in right ankle and joints of right foot  Muscle weakness Rt ankle pain  Rationale for Evaluation and Treatment: Rehabilitation  ONSET DATE: chronic with acute exacerbation on      SUBJECTIVE STATEMENT:  Pt states her pain continues to lessen in the ankle. Pt reports exercises are doing well at home.   EVAL: Pt states that she has had ankle pains on and off for years.  This acute episode started about 3 months ago, pt started using a cane since this time.  Pt states new shoes seemed to flare her up.  Pt states that she is only able to be on her feet for about 10 minutes with her cane at this time and then she is very sore.  Pt was given an ankle brace from the ortho MD.  PERTINENT HISTORY: OA, THR B, TSR, TKR Lt  PAIN:  Are you having pain? Yes: NPRS scale: 2-3/10;  highest is a 4/10 this past week; lowest 2 (pt has other pain but we will not address) Pain location: inside of foot.  Pt wears orthotics but she has not had new orthotics for 5 years.  Pain description: burning and sharp  Aggravating factors:wt bearing  Relieving factors: getting off her feet, ice and elevate.    PRECAUTIONS: None  RED FLAGS: None   WEIGHT BEARING RESTRICTIONS: No  FALLS:  Has patient fallen in last 6 months? No  LIVING ENVIRONMENT: Lives with: lives with their family Lives in: House/apartment Stairs: Yes: External: 2 steps; on right going up; goes up step to not reciprocal  Has following equipment at home: Single point cane  OCCUPATION: retired  PLOF: Independent with household mobility with device  PATIENT GOALS: less pain in her Rt ankle , get the strength that she needs back in her ankle, to be able to shop and be on her feet longer to be able to enjoy her life again.    NEXT MD VISIT: 04/27/23  OBJECTIVE:   DIAGNOSTIC FINDINGS: none available for ankle   PATIENT SURVEYS:  FOTO 22  COGNITION: Overall cognitive status: Within functional limits for tasks assessed     EDEMA:  Noted edema in both LE's pt states that the MD wanted therapy to look at this therapist explained that it most likely is lymphedema and we can see her following ankle rehab for this.      POSTURE: rounded shoulders, decreased lumbar lordosis, and increased thoracic kyphosis  PALPATION: Sore to the touch   LOWER EXTREMITY ROM:  Active ROM Right eval Left eval  Hip flexion    Hip extension    Hip abduction    Hip adduction    Hip internal rotation    Hip external rotation    Knee flexion    Knee extension Lacking 15    Ankle dorsiflexion 4 5  Ankle plantarflexion 45 50  Ankle inversion 12 30  Ankle eversion 10 12   (Blank rows = not tested)  LOWER  EXTREMITY MMT:  MMT Right eval Left eval  Hip flexion    Hip extension    Hip abduction    Hip adduction    Hip internal rotation    Hip external rotation    Knee flexion    Knee extension    Ankle dorsiflexion 4- 4  Ankle plantarflexion 1 4  Ankle inversion 3- 5  Ankle eversion 5 5   (Blank rows = not tested)  FUNCTIONAL TESTS:  30 seconds chair stand test: 4 reps , 6 is poor for age and sex; 53 is average.  2 minute walk test: using a cane 202 ft   Single leg stance: Lt: 10 seconds, Rt: 0"   TODAY'S TREATMENT:                                                                                                                              DATE:  04/18/23 Nustep L2 x 5', LEs only Gastroc stretch on slant board x30" Soleus stretch on slant board x30" Mini squat 2x10x3" Heel raises x10, with ball squeeze 2x10 Seated ankle DF assist on half foam roll 2x10 Standing ant tib stretch 2x30" Lunge on 4" step x10 Side step tap on 4" step x10 Towel scrunch x20 Toe yoga x20 Gait training: x50' with SPC, cues to keep knee from turning in and creating pressure into arch/pronation in foot, narrow BOS to decrease foot ER  04/16/23 NuStep, level 1, seat 10 x 5', >70 SPM Gastrocnemius slant board stretch x 30" x 3 Mini squats with hip abd, RTB x 3" x 10 x 2 R lunges with medial pull from RTB on knee x 3" x 10 x 2 Sidestepping with RTB on ankles x 3 rounds inside // bars Heel raises x 10 x 2 Standing toe  yoga x 10 Seated B hamstring stretch x 30" x 3  04/09/2023 Heel raise x 10 Squat x 10 Slant board stretch 15" x 5  Tandem stance with head turns  B x 5  4" step up x 10 B Toe raises x 15  Sitting : Baps level 2: 10 x ant/post, 10 x medial/lateral and 5 round the clock and counter clockwise Sit to stand x 10  Manual decongestive techniques for Rt LE.    04/04/23 Standing: Heel raise x 10 Squat x 10 Slant board stretch 15" x 5  Single leg stance 5 x each Lt: 14" max Rt one hand hold:  16"  Theraband green for all directions x 10  Manual decongestive techniques for Rt LE.   04/02/2023  -3 x 1' toe curls on towel. -Ankle Inversion 3 x 15 RTB; cues for eccentric control of 2 seconds.  -Seated Ankle PF x 30 with RTB -Seated Ankle DF x 30 with RTB -10x Elevated sit/stands with RTB for hip abduction facilitation; CGA for balance. -cues given for anterior weight shift. Holding yellow weighted ball.    03/28/23 Seated hamstring stretch x 30"  x 3 Seated hip abd, YTB x 3" x 10 x 2 Seated R gastrocnemius stretch with a strap x 30" x 3 Seated R Plantar fascia self STM with a tennis ball x 1' Seated ankle DF/PF and inversion, YTB x 10 x 2 Seated R towel scrunches x 1' Seated ankle alphabets x 1 set  03/20/23:  Evaluation   Ankle alphabet Heel raises x 10 Quad set x 10     PATIENT EDUCATION:  Education details: HEP Person educated: Patient Education method: Programmer, multimedia, Verbal cues, and Handouts Education comprehension: returned demonstration  HOME EXERCISE PROGRAM: Access Code: [email or web address] URL: [email or web address] 04/16/23 - Mini Squat with Counter Support  - 1-2 x daily - 7 x weekly - 2 sets - 10 reps - 3 hold - Side Stepping with Resistance at Ankles and Counter Support  - 1-2 x daily - 7 x weekly - 5 sets - Toe Yoga - Alternating Great Toe and Lesser Toe Extension  - 1 x daily - 7 x weekly - 2 sets - 10 reps  Date: 03/28/2023 Prepared by: Krystal Clark  Exercises - Seated Hamstring Stretch  - 1-2 x daily - 7 x weekly - 3 reps - 30 hold - Seated Calf Stretch with Strap  - 1-2 x daily - 7 x weekly - 3 reps - 30 hold - Seated Plantar Fascia Mobilization with Small Ball  - 1-2 x daily - 7 x weekly - Ankle and Toe Plantarflexion with Resistance  - 1-2 x daily - 7 x weekly - 2 sets - 10 reps - Ankle Dorsiflexion with Resistance  - 1-2 x daily - 7 x weekly - 2 sets - 10 reps - Seated Ankle Inversion with Resistance and Legs Crossed  - 1-2 x daily - 7 x weekly - 2 sets - 10 reps - Towel Scrunches  - 1-2 x daily - 7 x weekly - Seated Hip Abduction with Resistance  - 1-2 x daily - 7 x weekly - 2 sets - 10 reps - 3 hold  - Supine Quadricep Sets  - 10 x daily - 7 x weekly - 1 sets - 10 reps - Seated Ankle Alphabet  - 3 x daily - 7 x weekly - 1 sets - 1 reps - Heel Raises with Counter Support  - 3 x daily - 7 x weekly - 3 sets - 10 reps  ASSESSMENT:  CLINICAL IMPRESSION: Continued to work on improving single leg stability and weight bearing. Progressed exercises as pt tolerated. Pt is able to go deeper in her mini squats today. Pt has met STG #1. Discussed with pt about her gait pattern and keeping her knee from turning in and creating increased pronation/pressure into her arch.    EVAL: Patient is a 77 y.o. female who was seen today for physical therapy evaluation and treatment for Rt ankle pain. Pt evaluation demonstrates decreased ROM, decreased strength, decreased balance, decreased activity tolerance and increased pain.  Kathy Duran will benefit from skilled PT to address these deficits and improve her pain and functional ability.    OBJECTIVE IMPAIRMENTS: Abnormal gait, decreased activity tolerance, decreased balance, difficulty walking, decreased ROM, and decreased strength.     GOALS: Goals reviewed with patient? No  SHORT TERM GOALS: Target date: 04/10/23 PT to be I in HEP in order to decrease pain to no greater than a 6/10   Baseline: 10/10 on eval Goal status: MET (reports greatest pain is 4/10 on 04/18/23)  2.  Pt ankle strength to  be increased 1/2 grade to be able to be up on RT LE for  20   minutes prior to increased pain  Baseline:  Goal status: on-going  3.  Pt to be able to single leg stance for 5 seconds for reduced risk of falling.  Baseline:  Goal status:on-going  LONG TERM GOALS: Target date: 05/02/23  PT to be I in an advanced HEP in order to decrease pain to no greater than a 3 to be able to no longer need her cane or ankle brace.   Baseline: Baseline:  Goal status: on-going  2.  Pt ankle strength to be 4+/5 to able to be up on RT LE for  60 minutes prior to increased pain to allow pt to complete shopping/housework  Baseline:  Goal status:on-going  3.  Pt to be able to single leg stance for 15 seconds for reduced risk of falling. Baseline:  Goal status: on-going     PLAN:  PT FREQUENCY: 2x/week  PT DURATION: 6 weeks  PLANNED INTERVENTIONS: Therapeutic exercises, Therapeutic activity, Balance training, Gait training, Patient/Family education, Self Care, and Manual therapy  PLAN FOR NEXT SESSION: Continue POC and may progress as tolerated with emphasis on ankle strengthening and flexibility as well as hip and knee strengthening to minimize genu valgus.   Kathy Duran, PT 11:00 AM, 04/18/2023

## 2023-04-23 ENCOUNTER — Encounter (HOSPITAL_COMMUNITY): Payer: Self-pay | Admitting: Physical Therapy

## 2023-04-23 ENCOUNTER — Ambulatory Visit (HOSPITAL_COMMUNITY): Payer: Medicare PPO | Attending: Orthopaedic Surgery | Admitting: Physical Therapy

## 2023-04-23 DIAGNOSIS — M25571 Pain in right ankle and joints of right foot: Secondary | ICD-10-CM | POA: Diagnosis not present

## 2023-04-23 DIAGNOSIS — R262 Difficulty in walking, not elsewhere classified: Secondary | ICD-10-CM | POA: Diagnosis not present

## 2023-04-23 NOTE — Therapy (Signed)
OUTPATIENT PHYSICAL THERAPY LOWER EXTREMITY TREATMENT   Patient Name: Kathy Duran MRN: [id number] DOB:Jan 29, 1946, 77 y.o., female Today's Date: 04/23/2023  END OF SESSION:  PT End of Session - 04/23/23 1101     Visit Number 8    Number of Visits 12    Date for PT Re-Evaluation 05/01/23    Authorization Type [employer] ([employer] approved 12 visits from 03/25/23 - 05/07/23)    Authorization Time Period 03/25/23 - 05/07/23    Authorization - Number of Visits 12    Progress Note Due on Visit 10    PT Start Time 1101    PT Stop Time 1145    PT Time Calculation (min) 44 min    Activity Tolerance Patient tolerated treatment well    Behavior During Therapy [facility] for tasks assessed/performed              Past Medical History:  Diagnosis Date   Anxiety    Arthritis    left hip   Asthma    Inhalers for tx   Bilateral primary osteoarthritis of knee 07/06/2016   Moderate   Bilateral swelling of feet    bilateral ankle and feet edema   Bruises easily    Complication of anesthesia    sometimes needs oxygen when waking uo due to asthma   DDD (degenerative disc disease), lumbar 07/06/2016   Fibromyalgia    GERD (gastroesophageal reflux disease)    Hypertension    Myofacial muscle pain 07/06/2016   Osteoarthritis of both hands 07/06/2016   Pre-diabetes    Seasonal allergies 07/06/2016   Sinus complaint    Sjogren's syndrome (HCC)    dry eyes   Tenonitis    Urinary incontinence    occ. an issue wears pads   Past Surgical History:  Procedure Laterality Date   ABDOMINAL HYSTERECTOMY  1986   BACK SURGERY  1985   BREAST CYST ASPIRATION     BREAST SURGERY Right    cyst drainage   CARPAL TUNNEL RELEASE Right 2001   KNEE SURGERY  1991   REVERSE SHOULDER ARTHROPLASTY Left 03/01/2022   Procedure: REVERSE SHOULDER ARTHROPLASTY;  Surgeon: Francena Hanly, MD;  Location: [facility];  Service: Orthopedics;  Laterality: Left;    SHOULDER SURGERY Right    right shoulder tendon and rotator  cuff repair   TOTAL HIP ARTHROPLASTY Right 2006   TOTAL HIP ARTHROPLASTY Left 02/24/2016   Procedure: LEFT TOTAL HIP ARTHROPLASTY ANTERIOR APPROACH;  Surgeon: Kathryne Hitch, MD;  Location: [facility];  Service: Orthopedics;  Laterality: Left;   TOTAL KNEE ARTHROPLASTY Left 11/16/2019   Procedure: TOTAL KNEE ARTHROPLASTY;  Surgeon: Ollen Gross, MD;  Location: [facility];  Service: Orthopedics;  Laterality: Left;    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   Primary osteoarthritis of left knee 11/16/2019   Unilateral primary osteoarthritis, left knee 05/25/2019   Trochanteric bursitis, left hip 03/07/2017   Lymphedema 12/06/2016   Bilateral bunions 08/21/2016   Seasonal allergies 07/06/2016   Asthma 07/06/2016   Sjogren's syndrome (HCC) 07/06/2016   Bilateral primary osteoarthritis of knee 07/06/2016   Osteoarthritis of both hands 07/06/2016   Myofacial muscle pain 07/06/2016   DDD lumbar spine with spinal stenosis 07/06/2016   Abnormality of gait 06/26/2016   Chronic pain of right ankle 06/26/2016   H/O bilateral hip replacements 02/24/2016   Varicose veins 04/20/2015   Lipidemia 04/20/2015   Rheumatoid factor positive  11/20/2013   Back pain with left-sided sciatica 11/20/2013   HTN (hypertension) 11/20/2013   GERD (gastroesophageal reflux disease) 11/20/2013   Bladder spasm 11/20/2013   Hyperlipidemia 11/20/2013   Bilateral swelling of feet    Bruises easily     PCP: Georgann Housekeeper  REFERRING PROVIDER: Netta Cedars, MD  REFERRING DIAG:  Diagnosis  M25.571 (ICD-10-CM) - Pain in right ankle and joints of right foot    THERAPY DIAG:  Diagnosis  M25.571 (ICD-10-CM) - Pain in right ankle and joints of right foot  Muscle weakness Rt ankle pain  Rationale for Evaluation and Treatment: Rehabilitation  ONSET DATE: chronic with acute exacerbation on     SUBJECTIVE STATEMENT:  Pt states she  was very sore after last session. Still feeling it in her knees (R more than L).   EVAL: Pt states that she has had ankle pains on and off for years.  This acute episode started about 3 months ago, pt started using a cane since this time.  Pt states new shoes seemed to flare her up.  Pt states that she is only able to be on her feet for about 10 minutes with her cane at this time and then she is very sore.  Pt was given an ankle brace from the ortho MD.  PERTINENT HISTORY: OA, THR B, TSR, TKR Lt  PAIN:  Are you having pain? Yes: NPRS scale: 2-3/10;  highest is a 4/10 this past week; lowest 2 (pt has other pain but we will not address) Pain location: inside of foot.  Pt wears orthotics but she has not had new orthotics for 5 years.  Pain description: burning and sharp  Aggravating factors:wt bearing  Relieving factors: getting off her feet, ice and elevate.    PRECAUTIONS: None  RED FLAGS: None   WEIGHT BEARING RESTRICTIONS: No  FALLS:  Has patient fallen in last 6 months? No  LIVING ENVIRONMENT: Lives with: lives with their family Lives in: House/apartment Stairs: Yes: External: 2 steps; on right going up; goes up step to not reciprocal  Has following equipment at home: Single point cane  OCCUPATION: retired  PLOF: Independent with household mobility with device  PATIENT GOALS: less pain in her Rt ankle , get the strength that she needs back in her ankle, to be able to shop and be on her feet longer to be able to enjoy her life again.    NEXT MD VISIT: 04/27/23  OBJECTIVE:   DIAGNOSTIC FINDINGS: none available for ankle   PATIENT SURVEYS:  FOTO 22  COGNITION: Overall cognitive status: Within functional limits for tasks assessed     EDEMA:  Noted edema in both LE's pt states that the MD wanted therapy to look at this therapist explained that it most likely is lymphedema and we can see her following ankle rehab for this.     POSTURE: rounded shoulders, decreased lumbar  lordosis, and increased thoracic kyphosis  PALPATION: Sore to the touch   LOWER EXTREMITY ROM:  Active ROM Right eval Left eval  Hip flexion    Hip extension    Hip abduction    Hip adduction    Hip internal rotation    Hip external rotation    Knee flexion    Knee extension Lacking 15    Ankle dorsiflexion 4 5  Ankle plantarflexion 45 50  Ankle inversion 12 30  Ankle eversion 10 12   (Blank rows = not tested)  LOWER EXTREMITY MMT:  MMT Right eval  Left eval  Hip flexion    Hip extension    Hip abduction    Hip adduction    Hip internal rotation    Hip external rotation    Knee flexion    Knee extension    Ankle dorsiflexion 4- 4  Ankle plantarflexion 1 4  Ankle inversion 3- 5  Ankle eversion 5 5   (Blank rows = not tested)  FUNCTIONAL TESTS:  30 seconds chair stand test: 4 reps , 6 is poor for age and sex; 79 is average.  2 minute walk test: using a cane 202 ft   Single leg stance: Lt: 10 seconds, Rt: 0"   TODAY'S TREATMENT:                                                                                                                              DATE:  04/23/23 Nustep L2 x 5', LEs only Seated  Calf stretch with strap 2x30"  BAPS ankle DF/PF, inv/ev 2x10, CW & CCW x10 each  Ball squeeze between heels + heel raise 2x10  Clamshell green TB 2x10  Towel scrunch with 1# 2x10  Towel inv/ev with 1# 2x10  Arch lifting 2x10  Toe yoga 2x10  Toe spreading 2x10    04/18/23 Nustep L2 x 5', LEs only Gastroc stretch on slant board x30" Soleus stretch on slant board x30" Mini squat 2x10x3" Heel raises x10, with ball squeeze 2x10 Seated ankle DF assist on half foam roll 2x10 Standing ant tib stretch 2x30" Lunge on 4" step x10 Side step tap on 4" step x10 Towel scrunch x20 Toe yoga x20 Gait training: x50' with SPC, cues to keep knee from turning in and creating pressure into arch/pronation in foot, narrow BOS to decrease foot ER  04/16/23 NuStep, level 1,  seat 10 x 5', >70 SPM Gastrocnemius slant board stretch x 30" x 3 Mini squats with hip abd, RTB x 3" x 10 x 2 R lunges with medial pull from RTB on knee x 3" x 10 x 2 Sidestepping with RTB on ankles x 3 rounds inside // bars Heel raises x 10 x 2 Standing toe yoga x 10 Seated B hamstring stretch x 30" x 3  04/09/2023 Heel raise x 10 Squat x 10 Slant board stretch 15" x 5  Tandem stance with head turns  B x 5  4" step up x 10 B Toe raises x 15  Sitting : Baps level 2: 10 x ant/post, 10 x medial/lateral and 5 round the clock and counter clockwise Sit to stand x 10  Manual decongestive techniques for Rt LE.    04/04/23 Standing: Heel raise x 10 Squat x 10 Slant board stretch 15" x 5  Single leg stance 5 x each Lt: 14" max Rt one hand hold:  16"  Theraband green for all directions x 10  Manual decongestive techniques for Rt LE.   04/02/2023  -3 x 1' toe curls on  towel. -Ankle Inversion 3 x 15 RTB; cues for eccentric control of 2 seconds.  -Seated Ankle PF x 30 with RTB -Seated Ankle DF x 30 with RTB -10x Elevated sit/stands with RTB for hip abduction facilitation; CGA for balance. -cues given for anterior weight shift. Holding yellow weighted ball.    03/28/23 Seated hamstring stretch x 30" x 3 Seated hip abd, YTB x 3" x 10 x 2 Seated R gastrocnemius stretch with a strap x 30" x 3 Seated R Plantar fascia self STM with a tennis ball x 1' Seated ankle DF/PF and inversion, YTB x 10 x 2 Seated R towel scrunches x 1' Seated ankle alphabets x 1 set  03/20/23:  Evaluation   Ankle alphabet Heel raises x 10 Quad set x 10     PATIENT EDUCATION:  Education details: HEP Person educated: Patient Education method: Programmer, multimedia, Verbal cues, and Handouts Education comprehension: returned demonstration  HOME EXERCISE PROGRAM: Access Code: [email or web address] URL: [email or web address] 04/16/23 - Mini Squat with Counter Support  - 1-2 x daily - 7 x weekly - 2 sets - 10 reps - 3  hold - Side Stepping with Resistance at Ankles and Counter Support  - 1-2 x daily - 7 x weekly - 5 sets - Toe Yoga - Alternating Great Toe and Lesser Toe Extension  - 1 x daily - 7 x weekly - 2 sets - 10 reps  Date: 03/28/2023 Prepared by: Krystal Clark  Exercises - Seated Hamstring Stretch  - 1-2 x daily - 7 x weekly - 3 reps - 30 hold - Seated Calf Stretch with Strap  - 1-2 x daily - 7 x weekly - 3 reps - 30 hold - Seated Plantar Fascia Mobilization with Small Ball  - 1-2 x daily - 7 x weekly - Ankle and Toe Plantarflexion with Resistance  - 1-2 x daily - 7 x weekly - 2 sets - 10 reps - Ankle Dorsiflexion with Resistance  - 1-2 x daily - 7 x weekly - 2 sets - 10 reps - Seated Ankle Inversion with Resistance and Legs Crossed  - 1-2 x daily - 7 x weekly - 2 sets - 10 reps - Towel Scrunches  - 1-2 x daily - 7 x weekly - Seated Hip Abduction with Resistance  - 1-2 x daily - 7 x weekly - 2 sets - 10 reps - 3 hold  - Supine Quadricep Sets  - 10 x daily - 7 x weekly - 1 sets - 10 reps - Seated Ankle Alphabet  - 3 x daily - 7 x weekly - 1 sets - 1 reps - Heel Raises with Counter Support  - 3 x daily - 7 x weekly - 3 sets - 10 reps  ASSESSMENT:  CLINICAL IMPRESSION: Deferred standing exercises today due to pt report of increased knee pain today. Primarily worked on progress her foot/ankle strengthening. Challenged with arch lifting and hip abduction.   EVAL: Patient is a 77 y.o. female who was seen today for physical therapy evaluation and treatment for Rt ankle pain. Pt evaluation demonstrates decreased ROM, decreased strength, decreased balance, decreased activity tolerance and increased pain.  Ms. Racca will benefit from skilled PT to address these deficits and improve her pain and functional ability.    OBJECTIVE IMPAIRMENTS: Abnormal gait, decreased activity tolerance, decreased balance, difficulty walking, decreased ROM, and decreased strength.     GOALS: Goals reviewed with  patient? No  SHORT TERM GOALS: Target date: 04/10/23 PT to be I in HEP  in order to decrease pain to no greater than a 6/10  Baseline: 10/10 on eval Goal status: MET (reports greatest pain is 4/10 on 04/18/23)  2.  Pt ankle strength to be increased 1/2 grade to be able to be up on RT LE for  20   minutes prior to increased pain  Baseline:  Goal status: on-going  3.  Pt to be able to single leg stance for 5 seconds for reduced risk of falling.  Baseline:  Goal status:on-going  LONG TERM GOALS: Target date: 05/02/23  PT to be I in an advanced HEP in order to decrease pain to no greater than a 3 to be able to no longer need her cane or ankle brace.   Baseline: Baseline:  Goal status: on-going  2.  Pt ankle strength to be 4+/5 to able to be up on RT LE for  60 minutes prior to increased pain to allow pt to complete shopping/housework  Baseline:  Goal status:on-going  3.  Pt to be able to single leg stance for 15 seconds for reduced risk of falling. Baseline:  Goal status: on-going     PLAN:  PT FREQUENCY: 2x/week  PT DURATION: 6 weeks  PLANNED INTERVENTIONS: Therapeutic exercises, Therapeutic activity, Balance training, Gait training, Patient/Family education, Self Care, and Manual therapy  PLAN FOR NEXT SESSION: Continue POC and may progress as tolerated with emphasis on ankle strengthening and flexibility as well as hip and knee strengthening to minimize genu valgus.   Kaija Kovacevic April Ma L Khelani Kops, PT 11:02 AM, 04/23/2023

## 2023-04-25 ENCOUNTER — Ambulatory Visit (HOSPITAL_COMMUNITY): Payer: Medicare PPO | Admitting: Physical Therapy

## 2023-04-25 ENCOUNTER — Encounter (HOSPITAL_COMMUNITY): Payer: Self-pay | Admitting: Physical Therapy

## 2023-04-25 DIAGNOSIS — M25571 Pain in right ankle and joints of right foot: Secondary | ICD-10-CM

## 2023-04-25 DIAGNOSIS — R262 Difficulty in walking, not elsewhere classified: Secondary | ICD-10-CM | POA: Diagnosis not present

## 2023-04-25 NOTE — Therapy (Signed)
OUTPATIENT PHYSICAL THERAPY LOWER EXTREMITY TREATMENT   Patient Name: Kathy Duran MRN: [id number] DOB:04-15-1946, 77 y.o., female Today's Date: 04/25/2023  END OF SESSION:  PT End of Session - 04/25/23 1425     Visit Number 9    Number of Visits 12    Date for PT Re-Evaluation 05/01/23    Authorization Type [employer] ([employer] approved 12 visits from 03/25/23 - 05/07/23)    Authorization Time Period 03/25/23 - 05/07/23    Authorization - Visit Number 9    Authorization - Number of Visits 12    Progress Note Due on Visit 10    PT Start Time 1345    PT Stop Time 1423    PT Time Calculation (min) 38 min    Activity Tolerance Patient tolerated treatment well    Behavior During Therapy WFL for tasks assessed/performed               Past Medical History:  Diagnosis Date   Anxiety    Arthritis    left hip   Asthma    Inhalers for tx   Bilateral primary osteoarthritis of knee 07/06/2016   Moderate   Bilateral swelling of feet    bilateral ankle and feet edema   Bruises easily    Complication of anesthesia    sometimes needs oxygen when waking uo due to asthma   DDD (degenerative disc disease), lumbar 07/06/2016   Fibromyalgia    GERD (gastroesophageal reflux disease)    Hypertension    Myofacial muscle pain 07/06/2016   Osteoarthritis of both hands 07/06/2016   Pre-diabetes    Seasonal allergies 07/06/2016   Sinus complaint    Sjogren's syndrome (HCC)    dry eyes   Tenonitis    Urinary incontinence    occ. an issue wears pads   Past Surgical History:  Procedure Laterality Date   ABDOMINAL HYSTERECTOMY  1986   BACK SURGERY  1985   BREAST CYST ASPIRATION     BREAST SURGERY Right    cyst drainage   CARPAL TUNNEL RELEASE Right 2001   KNEE SURGERY  1991   REVERSE SHOULDER ARTHROPLASTY Left 03/01/2022   Procedure: REVERSE SHOULDER ARTHROPLASTY;  Surgeon: Francena Hanly, MD;  Location: [facility];  Service: Orthopedics;  Laterality: Left;    SHOULDER SURGERY Right     right shoulder tendon and rotator cuff repair   TOTAL HIP ARTHROPLASTY Right 2006   TOTAL HIP ARTHROPLASTY Left 02/24/2016   Procedure: LEFT TOTAL HIP ARTHROPLASTY ANTERIOR APPROACH;  Surgeon: Kathryne Hitch, MD;  Location: [facility];  Service: Orthopedics;  Laterality: Left;   TOTAL KNEE ARTHROPLASTY Left 11/16/2019   Procedure: TOTAL KNEE ARTHROPLASTY;  Surgeon: Ollen Gross, MD;  Location: [facility];  Service: Orthopedics;  Laterality: Left;    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   Primary osteoarthritis of left knee 11/16/2019   Unilateral primary osteoarthritis, left knee 05/25/2019   Trochanteric bursitis, left hip 03/07/2017   Lymphedema 12/06/2016   Bilateral bunions 08/21/2016   Seasonal allergies 07/06/2016   Asthma 07/06/2016   Sjogren's syndrome (HCC) 07/06/2016   Bilateral primary osteoarthritis of knee 07/06/2016   Osteoarthritis of both hands 07/06/2016   Myofascial muscle pain 07/06/2016   DDD lumbar spine with spinal stenosis 07/06/2016   Abnormality of gait 06/26/2016   Chronic pain of right ankle 06/26/2016   H/O bilateral hip replacements 02/24/2016   Varicose veins 04/20/2015  Lipidemia 04/20/2015   Rheumatoid factor positive 11/20/2013   Back pain with left-sided sciatica 11/20/2013   HTN (hypertension) 11/20/2013   GERD (gastroesophageal reflux disease) 11/20/2013   Bladder spasm 11/20/2013   Hyperlipidemia 11/20/2013   Bilateral swelling of feet    Bruises easily     PCP: Georgann Housekeeper  REFERRING PROVIDER: Netta Cedars, MD  REFERRING DIAG:  Diagnosis  M25.571 (ICD-10-CM) - Pain in right ankle and joints of right foot    THERAPY DIAG:  Diagnosis  M25.571 (ICD-10-CM) - Pain in right ankle and joints of right foot  Muscle weakness Rt ankle pain  Rationale for Evaluation and Treatment: Rehabilitation  ONSET DATE: chronic with acute exacerbation on      SUBJECTIVE STATEMENT:  Pt states that she is able to walk longer and is parking her can in the home more.   EVAL: Pt states that she has had ankle pains on and off for years.  This acute episode started about 3 months ago, pt started using a cane since this time.  Pt states new shoes seemed to flare her up.  Pt states that she is only able to be on her feet for about 10 minutes with her cane at this time and then she is very sore.  Pt was given an ankle brace from the ortho MD.  PERTINENT HISTORY: OA, THR B, TSR, TKR Lt  PAIN:  Are you having pain? Not at this time  NPRS scale: 2-3/10;  highest is a 4/10 this past week; lowest 2 (pt has other pain but we will not address) Pain location: inside of foot.  Pt wears orthotics but she has not had new orthotics for 5 years.  Pain description: burning and sharp  Aggravating factors:wt bearing  Relieving factors: getting off her feet, ice and elevate.    PRECAUTIONS: None  RED FLAGS: None   WEIGHT BEARING RESTRICTIONS: No  FALLS:  Has patient fallen in last 6 months? No  LIVING ENVIRONMENT: Lives with: lives with their family Lives in: House/apartment Stairs: Yes: External: 2 steps; on right going up; goes up step to not reciprocal  Has following equipment at home: Single point cane  OCCUPATION: retired  PLOF: Independent with household mobility with device  PATIENT GOALS: less pain in her Rt ankle , get the strength that she needs back in her ankle, to be able to shop and be on her feet longer to be able to enjoy her life again.    NEXT MD VISIT: 04/27/23  OBJECTIVE:   DIAGNOSTIC FINDINGS: none available for ankle   PATIENT SURVEYS:  FOTO 22  COGNITION: Overall cognitive status: Within functional limits for tasks assessed     EDEMA:  Noted edema in both LE's pt states that the MD wanted therapy to look at this therapist explained that it most likely is lymphedema and we can see her following ankle rehab for this.      POSTURE: rounded shoulders, decreased lumbar lordosis, and increased thoracic kyphosis  PALPATION: Sore to the touch   LOWER EXTREMITY ROM:  Active ROM Right eval Left eval  Hip flexion    Hip extension    Hip abduction    Hip adduction    Hip internal rotation    Hip external rotation    Knee flexion    Knee extension Lacking 15    Ankle dorsiflexion 4 5  Ankle plantarflexion 45 50  Ankle inversion 12 30  Ankle eversion 10 12   (Blank rows =  not tested)  LOWER EXTREMITY MMT:  MMT Right eval Left eval  Hip flexion    Hip extension    Hip abduction    Hip adduction    Hip internal rotation    Hip external rotation    Knee flexion    Knee extension    Ankle dorsiflexion 4- 4  Ankle plantarflexion 1 4  Ankle inversion 3- 5  Ankle eversion 5 5   (Blank rows = not tested)  FUNCTIONAL TESTS:  30 seconds chair stand test: 4 reps , 6 is poor for age and sex; 24 is average.  2 minute walk test: using a cane 202 ft   Single leg stance: Lt: 10 seconds, Rt: 0"   TODAY'S TREATMENT:                                                                                                                              DATE: 04/25/23 Standing: Baps Rt: dorsi/plantar x 10; in/eversion x 10 circles x 5  Heel raise x 10 Toe raises x 10 4" step up x 10  Sit to stand x 10 Single leg stance RT x 5; Lt x 5 x 20" Slant board 3 x 30" Terminal knee extension B x 10" against wall with red ball x 3    04/23/23 Nustep L2 x 5', LEs only Seated  Calf stretch with strap 2x30"  BAPS ankle DF/PF, inv/ev 2x10, CW & CCW x10 each  Ball squeeze between heels + heel raise 2x10  Clamshell green TB 2x10  Towel scrunch with 1# 2x10  Towel inv/ev with 1# 2x10  Arch lifting 2x10  Toe yoga 2x10  Toe spreading 2x10    04/18/23 Nustep L2 x 5', LEs only Gastroc stretch on slant board x30" Soleus stretch on slant board x30" Mini squat 2x10x3" Heel raises x10, with ball squeeze 2x10 Seated  ankle DF assist on half foam roll 2x10 Standing ant tib stretch 2x30" Lunge on 4" step x10 Side step tap on 4" step x10 Towel scrunch x20 Toe yoga x20 Gait training: x50' with SPC, cues to keep knee from turning in and creating pressure into arch/pronation in foot, narrow BOS to decrease foot ER  04/16/23 NuStep, level 1, seat 10 x 5', >70 SPM Gastrocnemius slant board stretch x 30" x 3 Mini squats with hip abd, RTB x 3" x 10 x 2 R lunges with medial pull from RTB on knee x 3" x 10 x 2 Sidestepping with RTB on ankles x 3 rounds inside // bars Heel raises x 10 x 2 Standing toe yoga x 10 Seated B hamstring stretch x 30" x 3  04/09/2023 Heel raise x 10 Squat x 10 Slant board stretch 15" x 5  Tandem stance with head turns  B x 5  4" step up x 10 B Toe raises x 15  Sitting : Baps level 2: 10 x ant/post, 10 x medial/lateral and 5 round the clock  and counter clockwise Sit to stand x 10  Manual decongestive techniques for Rt LE.    04/04/23 Standing: Heel raise x 10 Squat x 10 Slant board stretch 15" x 5  Single leg stance 5 x each Lt: 14" max Rt one hand hold:  16"  Theraband green for all directions x 10  Manual decongestive techniques for Rt LE.   04/02/2023  -3 x 1' toe curls on towel. -Ankle Inversion 3 x 15 RTB; cues for eccentric control of 2 seconds.  -Seated Ankle PF x 30 with RTB -Seated Ankle DF x 30 with RTB -10x Elevated sit/stands with RTB for hip abduction facilitation; CGA for balance. -cues given for anterior weight shift. Holding yellow weighted ball.    03/28/23 Seated hamstring stretch x 30" x 3 Seated hip abd, YTB x 3" x 10 x 2 Seated R gastrocnemius stretch with a strap x 30" x 3 Seated R Plantar fascia self STM with a tennis ball x 1' Seated ankle DF/PF and inversion, YTB x 10 x 2 Seated R towel scrunches x 1' Seated ankle alphabets x 1 set   PATIENT EDUCATION:  Education details: HEP Person educated: Patient Education method: Explanation, Verbal  cues, and Handouts Education comprehension: returned demonstration  HOME EXERCISE PROGRAM: Access Code: [email or web address] URL: [email or web address] 04/16/23 - Mini Squat with Counter Support  - 1-2 x daily - 7 x weekly - 2 sets - 10 reps - 3 hold - Side Stepping with Resistance at Ankles and Counter Support  - 1-2 x daily - 7 x weekly - 5 sets - Toe Yoga - Alternating Great Toe and Lesser Toe Extension  - 1 x daily - 7 x weekly - 2 sets - 10 reps  Date: 03/28/2023 Prepared by: Krystal Clark  Exercises - Seated Hamstring Stretch  - 1-2 x daily - 7 x weekly - 3 reps - 30 hold - Seated Calf Stretch with Strap  - 1-2 x daily - 7 x weekly - 3 reps - 30 hold - Seated Plantar Fascia Mobilization with Small Ball  - 1-2 x daily - 7 x weekly - Ankle and Toe Plantarflexion with Resistance  - 1-2 x daily - 7 x weekly - 2 sets - 10 reps - Ankle Dorsiflexion with Resistance  - 1-2 x daily - 7 x weekly - 2 sets - 10 reps - Seated Ankle Inversion with Resistance and Legs Crossed  - 1-2 x daily - 7 x weekly - 2 sets - 10 reps - Towel Scrunches  - 1-2 x daily - 7 x weekly - Seated Hip Abduction with Resistance  - 1-2 x daily - 7 x weekly - 2 sets - 10 reps - 3 hold  - Supine Quadricep Sets  - 10 x daily - 7 x weekly - 1 sets - 10 reps - Seated Ankle Alphabet  - 3 x daily - 7 x weekly - 1 sets - 1 reps - Heel Raises with Counter Support  - 3 x daily - 7 x weekly - 3 sets - 10 reps  ASSESSMENT:  CLINICAL IMPRESSION: Progressed pt to standing activity today with no complaint besides being difficult.  Pt walks with Knees bent therefore added terminal extension strengthening. No complaint of pain this session but pt did have to take 3 small rest breaks.     EVAL: Patient is a 77 y.o. female who was seen today for physical therapy evaluation and treatment for Rt ankle pain. Pt evaluation demonstrates decreased ROM, decreased strength,  decreased balance, decreased activity tolerance and increased  pain.  Ms. Koser will benefit from skilled PT to address these deficits and improve her pain and functional ability.    OBJECTIVE IMPAIRMENTS: Abnormal gait, decreased activity tolerance, decreased balance, difficulty walking, decreased ROM, and decreased strength.     GOALS: Goals reviewed with patient? No  SHORT TERM GOALS: Target date: 04/10/23 PT to be I in HEP in order to decrease pain to no greater than a 6/10  Baseline: 10/10 on eval Goal status: MET (reports greatest pain is 4/10 on 04/18/23)  2.  Pt ankle strength to be increased 1/2 grade to be able to be up on RT LE for  20   minutes prior to increased pain  Baseline:  Goal status: on-going  3.  Pt to be able to single leg stance for 5 seconds for reduced risk of falling.  Baseline:  Goal status:on-going  LONG TERM GOALS: Target date: 05/02/23  PT to be I in an advanced HEP in order to decrease pain to no greater than a 3 to be able to no longer need her cane or ankle brace.   Baseline: Baseline:  Goal status: on-going  2.  Pt ankle strength to be 4+/5 to able to be up on RT LE for  60 minutes prior to increased pain to allow pt to complete shopping/housework  Baseline:  Goal status:on-going  3.  Pt to be able to single leg stance for 15 seconds for reduced risk of falling. Baseline:  Goal status: on-going     PLAN:  PT FREQUENCY: 2x/week  PT DURATION: 6 weeks  PLANNED INTERVENTIONS: Therapeutic exercises, Therapeutic activity, Balance training, Gait training, Patient/Family education, Self Care, and Manual therapy  PLAN FOR NEXT SESSION: Reassessment .  Continue POC and may progress as tolerated with emphasis on ankle strengthening and flexibility as well as hip and knee strengthening to minimize genu valgus.   Virgina Organ, PT CLT [phone]  2:26 PM, 04/25/2023

## 2023-04-29 ENCOUNTER — Encounter (HOSPITAL_COMMUNITY): Payer: Medicare PPO

## 2023-04-29 DIAGNOSIS — M059 Rheumatoid arthritis with rheumatoid factor, unspecified: Secondary | ICD-10-CM | POA: Diagnosis not present

## 2023-04-30 ENCOUNTER — Encounter (HOSPITAL_COMMUNITY): Payer: Medicare PPO | Admitting: Physical Therapy

## 2023-05-01 ENCOUNTER — Other Ambulatory Visit: Payer: Self-pay | Admitting: Internal Medicine

## 2023-05-01 DIAGNOSIS — M3500 Sicca syndrome, unspecified: Secondary | ICD-10-CM | POA: Diagnosis not present

## 2023-05-01 DIAGNOSIS — E782 Mixed hyperlipidemia: Secondary | ICD-10-CM | POA: Diagnosis not present

## 2023-05-01 DIAGNOSIS — Z1239 Encounter for other screening for malignant neoplasm of breast: Secondary | ICD-10-CM | POA: Diagnosis not present

## 2023-05-01 DIAGNOSIS — M069 Rheumatoid arthritis, unspecified: Secondary | ICD-10-CM | POA: Diagnosis not present

## 2023-05-01 DIAGNOSIS — Z1231 Encounter for screening mammogram for malignant neoplasm of breast: Secondary | ICD-10-CM

## 2023-05-01 DIAGNOSIS — R7303 Prediabetes: Secondary | ICD-10-CM | POA: Diagnosis not present

## 2023-05-01 DIAGNOSIS — Z23 Encounter for immunization: Secondary | ICD-10-CM | POA: Diagnosis not present

## 2023-05-01 DIAGNOSIS — J45909 Unspecified asthma, uncomplicated: Secondary | ICD-10-CM | POA: Diagnosis not present

## 2023-05-01 DIAGNOSIS — I10 Essential (primary) hypertension: Secondary | ICD-10-CM | POA: Diagnosis not present

## 2023-05-02 ENCOUNTER — Ambulatory Visit (HOSPITAL_COMMUNITY): Payer: Medicare PPO | Admitting: Physical Therapy

## 2023-05-02 ENCOUNTER — Ambulatory Visit (HOSPITAL_COMMUNITY): Payer: Medicare PPO

## 2023-05-02 DIAGNOSIS — R262 Difficulty in walking, not elsewhere classified: Secondary | ICD-10-CM | POA: Diagnosis not present

## 2023-05-02 DIAGNOSIS — M25571 Pain in right ankle and joints of right foot: Secondary | ICD-10-CM

## 2023-05-02 NOTE — Therapy (Addendum)
OUTPATIENT PHYSICAL THERAPY LOWER EXTREMITY PROGRESS/DISCHARGE NOTE   Patient Name: Kathy Duran MRN: [id number] DOB:1945/12/01, 77 y.o., female Today's Date: 05/02/2023  PHYSICAL THERAPY DISCHARGE SUMMARY  Visits from Start of Care: 10  Current functional level related to goals / functional outcomes: See below   Remaining deficits: See below   Education / Equipment: See below   Patient agrees to discharge. Patient goals were partially met. Patient is being discharged due to maximized rehab potential.    END OF SESSION:  PT End of Session - 05/02/23 1351     Visit Number 10    Number of Visits 12    Date for PT Re-Evaluation 05/01/23    Authorization Type [employer] ([employer] approved 12 visits from 03/25/23 - 05/07/23)    Authorization Time Period 03/25/23 - 05/07/23    Authorization - Visit Number 10    Authorization - Number of Visits 12    Progress Note Due on Visit 10    PT Start Time 1345    PT Stop Time 1430    PT Time Calculation (min) 45 min            Past Medical History:  Diagnosis Date   Anxiety    Arthritis    left hip   Asthma    Inhalers for tx   Bilateral primary osteoarthritis of knee 07/06/2016   Moderate   Bilateral swelling of feet    bilateral ankle and feet edema   Bruises easily    Complication of anesthesia    sometimes needs oxygen when waking uo due to asthma   DDD (degenerative disc disease), lumbar 07/06/2016   Fibromyalgia    GERD (gastroesophageal reflux disease)    Hypertension    Myofacial muscle pain 07/06/2016   Osteoarthritis of both hands 07/06/2016   Pre-diabetes    Seasonal allergies 07/06/2016   Sinus complaint    Sjogren's syndrome (HCC)    dry eyes   Tenonitis    Urinary incontinence    occ. an issue wears pads   Past Surgical History:  Procedure Laterality Date   ABDOMINAL HYSTERECTOMY  1986   BACK SURGERY  1985   BREAST CYST ASPIRATION     BREAST SURGERY Right    cyst drainage   CARPAL TUNNEL RELEASE  Right 2001   KNEE SURGERY  1991   REVERSE SHOULDER ARTHROPLASTY Left 03/01/2022   Procedure: REVERSE SHOULDER ARTHROPLASTY;  Surgeon: Francena Hanly, MD;  Location: [facility];  Service: Orthopedics;  Laterality: Left;    SHOULDER SURGERY Right    right shoulder tendon and rotator cuff repair   TOTAL HIP ARTHROPLASTY Right 2006   TOTAL HIP ARTHROPLASTY Left 02/24/2016   Procedure: LEFT TOTAL HIP ARTHROPLASTY ANTERIOR APPROACH;  Surgeon: Kathryne Hitch, MD;  Location: [facility];  Service: Orthopedics;  Laterality: Left;   TOTAL KNEE ARTHROPLASTY Left 11/16/2019   Procedure: TOTAL KNEE ARTHROPLASTY;  Surgeon: Ollen Gross, MD;  Location: [facility];  Service: Orthopedics;  Laterality: Left;    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   Primary osteoarthritis of left knee 11/16/2019   Unilateral primary osteoarthritis, left knee 05/25/2019   Trochanteric bursitis, left hip 03/07/2017   Lymphedema 12/06/2016   Bilateral bunions 08/21/2016   Seasonal allergies 07/06/2016   Asthma 07/06/2016   Sjogren's syndrome (HCC) 07/06/2016   Bilateral primary osteoarthritis of knee 07/06/2016   Osteoarthritis of both hands 07/06/2016   Myofascial  muscle pain 07/06/2016   DDD lumbar spine with spinal stenosis 07/06/2016   Abnormality of gait 06/26/2016   Chronic pain of right ankle 06/26/2016   H/O bilateral hip replacements 02/24/2016   Varicose veins 04/20/2015   Lipidemia 04/20/2015   Rheumatoid factor positive 11/20/2013   Back pain with left-sided sciatica 11/20/2013   HTN (hypertension) 11/20/2013   GERD (gastroesophageal reflux disease) 11/20/2013   Bladder spasm 11/20/2013   Hyperlipidemia 11/20/2013   Bilateral swelling of feet    Bruises easily    Progress Note Reporting Period 03/20/23 to 05/02/23  See note below for Objective Data and Assessment of Progress/Goals.     PCP: Georgann Housekeeper  REFERRING  PROVIDER: Netta Cedars, MD  REFERRING DIAG:  Diagnosis  M25.571 (ICD-10-CM) - Pain in right ankle and joints of right foot    THERAPY DIAG:  Diagnosis  M25.571 (ICD-10-CM) - Pain in right ankle and joints of right foot  Muscle weakness Rt ankle pain  Rationale for Evaluation and Treatment: Rehabilitation  ONSET DATE: chronic with acute exacerbation on     SUBJECTIVE STATEMENT:  Patient denies any pain today on the hips and ankles (0/10). Patient states that she was sore after the last session. Patient states that she is 75% better. Patient states that she thinks that she may need more PT because she needs to move more.  EVAL: Pt states that she has had ankle pains on and off for years.  This acute episode started about 3 months ago, pt started using a cane since this time.  Pt states new shoes seemed to flare her up.  Pt states that she is only able to be on her feet for about 10 minutes with her cane at this time and then she is very sore.  Pt was given an ankle brace from the ortho MD.  PERTINENT HISTORY: OA, THR B, TSR, TKR Lt  PAIN:  Are you having pain? Not at this time  NPRS scale: 2-3/10;  highest is a 4/10 this past week; lowest 2 (pt has other pain but we will not address) Pain location: inside of foot.  Pt wears orthotics but she has not had new orthotics for 5 years.  Pain description: burning and sharp  Aggravating factors:wt bearing  Relieving factors: getting off her feet, ice and elevate.    PRECAUTIONS: None  RED FLAGS: None   WEIGHT BEARING RESTRICTIONS: No  FALLS:  Has patient fallen in last 6 months? No  LIVING ENVIRONMENT: Lives with: lives with their family Lives in: House/apartment Stairs: Yes: External: 2 steps; on right going up; goes up step to not reciprocal  Has following equipment at home: Single point cane  OCCUPATION: retired  PLOF: Independent with household mobility with device  PATIENT GOALS: less pain in her Rt ankle , get  the strength that she needs back in her ankle, to be able to shop and be on her feet longer to be able to enjoy her life again.    NEXT MD VISIT: 04/27/23  OBJECTIVE: All objective findings are from initial evaluation unless dated  DIAGNOSTIC FINDINGS: none available for ankle   PATIENT SURVEYS:  FOTO 05/02/23: 58.03 from 22  COGNITION: Overall cognitive status: Within functional limits for tasks assessed     EDEMA:  Noted edema in both LE's pt states that the MD wanted therapy to look at this therapist explained that it most likely is lymphedema and we can see her following ankle rehab for this.  POSTURE: rounded shoulders, decreased lumbar lordosis, and increased thoracic kyphosis  PALPATION: Sore to the touch   LOWER EXTREMITY ROM:  Active ROM Right eval Left eval Right 05/02/23 Left 05/02/23  Hip flexion      Hip extension      Hip abduction      Hip adduction      Hip internal rotation      Hip external rotation      Knee flexion      Knee extension Lacking 15      Ankle dorsiflexion 4 5 0 10  Ankle plantarflexion 45 50 45 60  Ankle inversion 12 30 30 30   Ankle eversion 10 12 15 20    (Blank rows = not tested)  LOWER EXTREMITY MMT:  MMT Right eval Left eval Right 05/02/23 Left 05/02/23  Hip flexion      Hip extension      Hip abduction      Hip adduction      Hip internal rotation      Hip external rotation      Knee flexion      Knee extension      Ankle dorsiflexion 4- 4 4 5   Ankle plantarflexion 1 4 4 5   Ankle inversion 3- 5 4 5   Ankle eversion 5 5 5 5    (Blank rows = not tested)  FUNCTIONAL TESTS:  30 seconds chair stand test: 05/02/23: 8 reps from 4 reps  (6 is poor for age and sex; 49 is average).  2 minute walk test: 05/02/23 327 ft without cane from using a cane 202 ft   Single leg stance: 05/02/23: L 10 sec R = 3 sec from Lt: 10 seconds, Rt: 0"   TODAY'S TREATMENT:                                                                                                                               DATE:  05/02/23 NuStep, level 3, seat 10 x 5', >70 SPM Progress note (ROM, MMT, 5TSTS, FOTO, )  04/25/23 Standing: Baps Rt: dorsi/plantar x 10; in/eversion x 10 circles x 5  Heel raise x 10 Toe raises x 10 4" step up x 10  Sit to stand x 10 Single leg stance RT x 5; Lt x 5 x 20" Slant board 3 x 30" Terminal knee extension B x 10" against wall with red ball x 3    04/23/23 Nustep L2 x 5', LEs only Seated  Calf stretch with strap 2x30"  BAPS ankle DF/PF, inv/ev 2x10, CW & CCW x10 each  Ball squeeze between heels + heel raise 2x10  Clamshell green TB 2x10  Towel scrunch with 1# 2x10  Towel inv/ev with 1# 2x10  Arch lifting 2x10  Toe yoga 2x10  Toe spreading 2x10    04/18/23 Nustep L2 x 5', LEs only Gastroc stretch on slant board x30" Soleus stretch on slant board x30" Mini squat 2x10x3" Heel raises  x10, with ball squeeze 2x10 Seated ankle DF assist on half foam roll 2x10 Standing ant tib stretch 2x30" Lunge on 4" step x10 Side step tap on 4" step x10 Towel scrunch x20 Toe yoga x20 Gait training: x50' with SPC, cues to keep knee from turning in and creating pressure into arch/pronation in foot, narrow BOS to decrease foot ER  04/16/23 NuStep, level 1, seat 10 x 5', >70 SPM Gastrocnemius slant board stretch x 30" x 3 Mini squats with hip abd, RTB x 3" x 10 x 2 R lunges with medial pull from RTB on knee x 3" x 10 x 2 Sidestepping with RTB on ankles x 3 rounds inside // bars Heel raises x 10 x 2 Standing toe yoga x 10 Seated B hamstring stretch x 30" x 3  04/09/2023 Heel raise x 10 Squat x 10 Slant board stretch 15" x 5  Tandem stance with head turns  B x 5  4" step up x 10 B Toe raises x 15  Sitting : Baps level 2: 10 x ant/post, 10 x medial/lateral and 5 round the clock and counter clockwise Sit to stand x 10  Manual decongestive techniques for Rt LE.    04/04/23 Standing: Heel raise x 10 Squat x  10 Slant board stretch 15" x 5  Single leg stance 5 x each Lt: 14" max Rt one hand hold:  16"  Theraband green for all directions x 10  Manual decongestive techniques for Rt LE.   04/02/2023  -3 x 1' toe curls on towel. -Ankle Inversion 3 x 15 RTB; cues for eccentric control of 2 seconds.  -Seated Ankle PF x 30 with RTB -Seated Ankle DF x 30 with RTB -10x Elevated sit/stands with RTB for hip abduction facilitation; CGA for balance. -cues given for anterior weight shift. Holding yellow weighted ball.    03/28/23 Seated hamstring stretch x 30" x 3 Seated hip abd, YTB x 3" x 10 x 2 Seated R gastrocnemius stretch with a strap x 30" x 3 Seated R Plantar fascia self STM with a tennis ball x 1' Seated ankle DF/PF and inversion, YTB x 10 x 2 Seated R towel scrunches x 1' Seated ankle alphabets x 1 set   PATIENT EDUCATION: 05/02/23 Education details: Educated patient on the importance of compliance to HEP. Educated patient on the reason for the d/c. Educated patient on the need for another PT referral for lymphedema Person educated: Patient Education method: Explanation Education comprehension: verbalized understanding  HOME EXERCISE PROGRAM: Access Code: [email or web address] URL: [email or web address] 04/16/23 - Mini Squat with Counter Support  - 1-2 x daily - 7 x weekly - 2 sets - 10 reps - 3 hold - Side Stepping with Resistance at Ankles and Counter Support  - 1-2 x daily - 7 x weekly - 5 sets - Toe Yoga - Alternating Great Toe and Lesser Toe Extension  - 1 x daily - 7 x weekly - 2 sets - 10 reps  Date: 03/28/2023 Prepared by: Krystal Clark  Exercises - Seated Hamstring Stretch  - 1-2 x daily - 7 x weekly - 3 reps - 30 hold - Seated Calf Stretch with Strap  - 1-2 x daily - 7 x weekly - 3 reps - 30 hold - Seated Plantar Fascia Mobilization with Small Ball  - 1-2 x daily - 7 x weekly - Ankle and Toe Plantarflexion with Resistance  - 1-2 x daily - 7 x weekly - 2 sets - 10 reps -  Ankle Dorsiflexion with Resistance  - 1-2 x daily - 7 x weekly - 2 sets - 10 reps - Seated Ankle Inversion with Resistance and Legs Crossed  - 1-2 x daily - 7 x weekly - 2 sets - 10 reps - Towel Scrunches  - 1-2 x daily - 7 x weekly - Seated Hip Abduction with Resistance  - 1-2 x daily - 7 x weekly - 2 sets - 10 reps - 3 hold  - Supine Quadricep Sets  - 10 x daily - 7 x weekly - 1 sets - 10 reps - Seated Ankle Alphabet  - 3 x daily - 7 x weekly - 1 sets - 1 reps - Heel Raises with Counter Support  - 3 x daily - 7 x weekly - 3 sets - 10 reps  ASSESSMENT:  CLINICAL IMPRESSION: Patient demonstrated continued improvements in function as indicated by positive significant changes in FOTO, 5TSTS and . Patient's ROM has also improved. Patient also does not report of any pain. Patient still presents with some weakness on the LE but the weakness does not interfere significantly with function as indicated with improvement in FOTO scores. With this, skilled PT is not required at this time to address pain and function. Patient may just need to continue her HEP at home to facilitate carry-over of the gains in PT. However, patient presents with significant swelling on the legs and ankles and may need to be examined by her referring provider to consider possible PT evaluation and management for lymphedema.    EVAL: Patient is a 77 y.o. female who was seen today for physical therapy evaluation and treatment for Rt ankle pain. Pt evaluation demonstrates decreased ROM, decreased strength, decreased balance, decreased activity tolerance and increased pain.  Ms. Deutschman will benefit from skilled PT to address these deficits and improve her pain and functional ability.    OBJECTIVE IMPAIRMENTS: difficulty walking and swelling .     GOALS: Goals reviewed with patient? Yes  SHORT TERM GOALS: Target date: 04/10/23 PT to be I in HEP in order to decrease pain to no greater than a 6/10  Baseline: 10/10 on eval Goal  status: MET (reports greatest pain is 4/10 on 04/18/23)  2.  Pt ankle strength to be increased 1/2 grade to be able to be up on RT LE for  20   minutes prior to increased pain  Baseline:  Goal status: MET  3.  Pt to be able to single leg stance for 5 seconds for reduced risk of falling.  Baseline:  Goal status:NOT MET  LONG TERM GOALS: Target date: 05/02/23  PT to be I in an advanced HEP in order to decrease pain to no greater than a 3 to be able to no longer need her cane or ankle brace.    Baseline:  Goal status: MET  2.  Pt ankle strength to be 4+/5 to able to be up on RT LE for  60 minutes prior to increased pain to allow pt to complete shopping/housework  Baseline:  Goal status:NOT MET  3.  Pt to be able to single leg stance for 15 seconds for reduced risk of falling. Baseline:  Goal status: NOT MET     PLAN:  PT FREQUENCY:  0  PT DURATION: other: 0  PLANNED INTERVENTIONS:  D/C from skilled PT. D/C to HEP  Caydon Feasel L. Isadora Delorey, PT, DPT, OCS Board-Certified Clinical Specialist in Orthopedic PT PT Compact Privilege # ([place]): [phone] T  1:57 PM, 05/02/2023

## 2023-05-06 ENCOUNTER — Ambulatory Visit
Admission: RE | Admit: 2023-05-06 | Discharge: 2023-05-06 | Disposition: A | Discharge status: Home / Self Care | Payer: Medicare PPO | Source: Ambulatory Visit | Attending: Internal Medicine | Admitting: Internal Medicine

## 2023-05-06 DIAGNOSIS — Z1231 Encounter for screening mammogram for malignant neoplasm of breast: Secondary | ICD-10-CM | POA: Diagnosis not present

## 2023-05-07 ENCOUNTER — Encounter (HOSPITAL_COMMUNITY): Payer: Medicare PPO | Admitting: Physical Therapy

## 2023-05-07 DIAGNOSIS — M2141 Flat foot [pes planus] (acquired), right foot: Secondary | ICD-10-CM | POA: Diagnosis not present

## 2023-05-09 ENCOUNTER — Encounter (HOSPITAL_COMMUNITY): Payer: Medicare PPO | Admitting: Physical Therapy

## 2023-05-27 DIAGNOSIS — R5383 Other fatigue: Secondary | ICD-10-CM | POA: Diagnosis not present

## 2023-05-27 DIAGNOSIS — Z79899 Other long term (current) drug therapy: Secondary | ICD-10-CM | POA: Diagnosis not present

## 2023-05-27 DIAGNOSIS — M059 Rheumatoid arthritis with rheumatoid factor, unspecified: Secondary | ICD-10-CM | POA: Diagnosis not present

## 2023-06-18 DIAGNOSIS — J309 Allergic rhinitis, unspecified: Secondary | ICD-10-CM | POA: Diagnosis not present

## 2023-06-18 DIAGNOSIS — K219 Gastro-esophageal reflux disease without esophagitis: Secondary | ICD-10-CM | POA: Diagnosis not present

## 2023-06-18 DIAGNOSIS — R0981 Nasal congestion: Secondary | ICD-10-CM | POA: Diagnosis not present

## 2023-06-18 DIAGNOSIS — F411 Generalized anxiety disorder: Secondary | ICD-10-CM | POA: Diagnosis not present

## 2023-07-01 DIAGNOSIS — M059 Rheumatoid arthritis with rheumatoid factor, unspecified: Secondary | ICD-10-CM | POA: Diagnosis not present

## 2023-07-01 DIAGNOSIS — Z6834 Body mass index (BMI) 34.0-34.9, adult: Secondary | ICD-10-CM | POA: Diagnosis not present

## 2023-07-01 DIAGNOSIS — M2560 Stiffness of unspecified joint, not elsewhere classified: Secondary | ICD-10-CM | POA: Diagnosis not present

## 2023-07-01 DIAGNOSIS — Z8739 Personal history of other diseases of the musculoskeletal system and connective tissue: Secondary | ICD-10-CM | POA: Diagnosis not present

## 2023-07-01 DIAGNOSIS — E669 Obesity, unspecified: Secondary | ICD-10-CM | POA: Diagnosis not present

## 2023-07-01 DIAGNOSIS — M2540 Effusion, unspecified joint: Secondary | ICD-10-CM | POA: Diagnosis not present

## 2023-07-01 DIAGNOSIS — M1991 Primary osteoarthritis, unspecified site: Secondary | ICD-10-CM | POA: Diagnosis not present

## 2023-07-19 DIAGNOSIS — Z79890 Hormone replacement therapy (postmenopausal): Secondary | ICD-10-CM | POA: Diagnosis not present

## 2023-07-19 DIAGNOSIS — N952 Postmenopausal atrophic vaginitis: Secondary | ICD-10-CM | POA: Diagnosis not present

## 2023-07-19 DIAGNOSIS — N951 Menopausal and female climacteric states: Secondary | ICD-10-CM | POA: Diagnosis not present

## 2023-07-22 DIAGNOSIS — Z79899 Other long term (current) drug therapy: Secondary | ICD-10-CM | POA: Diagnosis not present

## 2023-07-22 DIAGNOSIS — M059 Rheumatoid arthritis with rheumatoid factor, unspecified: Secondary | ICD-10-CM | POA: Diagnosis not present

## 2023-07-29 DIAGNOSIS — N3000 Acute cystitis without hematuria: Secondary | ICD-10-CM | POA: Diagnosis not present

## 2023-07-29 DIAGNOSIS — R399 Unspecified symptoms and signs involving the genitourinary system: Secondary | ICD-10-CM | POA: Diagnosis not present

## 2023-08-15 DIAGNOSIS — M2141 Flat foot [pes planus] (acquired), right foot: Secondary | ICD-10-CM | POA: Diagnosis not present

## 2023-08-15 DIAGNOSIS — M76821 Posterior tibial tendinitis, right leg: Secondary | ICD-10-CM | POA: Diagnosis not present

## 2023-08-15 DIAGNOSIS — I890 Lymphedema, not elsewhere classified: Secondary | ICD-10-CM | POA: Diagnosis not present

## 2023-08-15 DIAGNOSIS — M2142 Flat foot [pes planus] (acquired), left foot: Secondary | ICD-10-CM | POA: Diagnosis not present

## 2023-08-16 DIAGNOSIS — R300 Dysuria: Secondary | ICD-10-CM | POA: Diagnosis not present

## 2023-08-21 DIAGNOSIS — I10 Essential (primary) hypertension: Secondary | ICD-10-CM | POA: Diagnosis not present

## 2023-08-21 DIAGNOSIS — J0100 Acute maxillary sinusitis, unspecified: Secondary | ICD-10-CM | POA: Diagnosis not present

## 2023-08-21 DIAGNOSIS — R040 Epistaxis: Secondary | ICD-10-CM | POA: Diagnosis not present

## 2023-08-21 DIAGNOSIS — M069 Rheumatoid arthritis, unspecified: Secondary | ICD-10-CM | POA: Diagnosis not present

## 2023-08-26 DIAGNOSIS — B349 Viral infection, unspecified: Secondary | ICD-10-CM | POA: Diagnosis not present

## 2023-08-26 DIAGNOSIS — R051 Acute cough: Secondary | ICD-10-CM | POA: Diagnosis not present

## 2023-08-26 DIAGNOSIS — U071 COVID-19: Secondary | ICD-10-CM | POA: Diagnosis not present

## 2023-09-04 ENCOUNTER — Other Ambulatory Visit: Payer: Self-pay

## 2023-09-04 ENCOUNTER — Emergency Department (HOSPITAL_BASED_OUTPATIENT_CLINIC_OR_DEPARTMENT_OTHER)
Admission: EM | Admit: 2023-09-04 | Discharge: 2023-09-04 | Disposition: A | Discharge status: Home / Self Care | Payer: Medicare PPO | Attending: Emergency Medicine | Admitting: Emergency Medicine

## 2023-09-04 ENCOUNTER — Emergency Department (HOSPITAL_BASED_OUTPATIENT_CLINIC_OR_DEPARTMENT_OTHER): Payer: Medicare PPO

## 2023-09-04 ENCOUNTER — Encounter (HOSPITAL_BASED_OUTPATIENT_CLINIC_OR_DEPARTMENT_OTHER): Payer: Self-pay | Admitting: Emergency Medicine

## 2023-09-04 DIAGNOSIS — R109 Unspecified abdominal pain: Secondary | ICD-10-CM | POA: Diagnosis not present

## 2023-09-04 DIAGNOSIS — K429 Umbilical hernia without obstruction or gangrene: Secondary | ICD-10-CM | POA: Diagnosis not present

## 2023-09-04 DIAGNOSIS — I10 Essential (primary) hypertension: Secondary | ICD-10-CM | POA: Insufficient documentation

## 2023-09-04 DIAGNOSIS — Z79899 Other long term (current) drug therapy: Secondary | ICD-10-CM | POA: Diagnosis not present

## 2023-09-04 DIAGNOSIS — Z91040 Latex allergy status: Secondary | ICD-10-CM | POA: Diagnosis not present

## 2023-09-04 DIAGNOSIS — J45909 Unspecified asthma, uncomplicated: Secondary | ICD-10-CM | POA: Insufficient documentation

## 2023-09-04 DIAGNOSIS — R1084 Generalized abdominal pain: Secondary | ICD-10-CM | POA: Insufficient documentation

## 2023-09-04 DIAGNOSIS — N200 Calculus of kidney: Secondary | ICD-10-CM | POA: Diagnosis not present

## 2023-09-04 LAB — URINALYSIS, ROUTINE W REFLEX MICROSCOPIC
Bilirubin Urine: NEGATIVE
Glucose, UA: NEGATIVE mg/dL
Hgb urine dipstick: NEGATIVE
Ketones, ur: NEGATIVE mg/dL
Leukocytes,Ua: NEGATIVE
Nitrite: NEGATIVE
Specific Gravity, Urine: 1.025 (ref 1.005–1.030)
pH: 6 (ref 5.0–8.0)

## 2023-09-04 LAB — COMPREHENSIVE METABOLIC PANEL
ALT: 23 U/L (ref 0–44)
AST: 19 U/L (ref 15–41)
Albumin: 3.9 g/dL (ref 3.5–5.0)
Alkaline Phosphatase: 101 U/L (ref 38–126)
Anion gap: 8 (ref 5–15)
BUN: 11 mg/dL (ref 8–23)
CO2: 28 mmol/L (ref 22–32)
Calcium: 9.6 mg/dL (ref 8.9–10.3)
Chloride: 104 mmol/L (ref 98–111)
Creatinine, Ser: 0.6 mg/dL (ref 0.44–1.00)
GFR, Estimated: >60 mL/min (ref >60)
Glucose, Bld: 83 mg/dL (ref 70–99)
Potassium: 4 mmol/L (ref 3.5–5.1)
Sodium: 140 mmol/L (ref 135–145)
Total Bilirubin: 0.5 mg/dL (ref 0.0–1.2)
Total Protein: 7.1 g/dL (ref 6.5–8.1)

## 2023-09-04 LAB — CBC
HCT: 39.5 % (ref 36.0–46.0)
Hemoglobin: 12.7 g/dL (ref 12.0–15.0)
MCH: 29 pg (ref 26.0–34.0)
MCHC: 32.2 g/dL (ref 30.0–36.0)
MCV: 90.2 fL (ref 80.0–100.0)
Platelets: 296 10*3/uL (ref 150–400)
RBC: 4.38 MIL/uL (ref 3.87–5.11)
RDW: 15.9 % — ABNORMAL HIGH (ref 11.5–15.5)
WBC: 6 10*3/uL (ref 4.0–10.5)
nRBC: 0 % (ref 0.0–0.2)

## 2023-09-04 LAB — LIPASE, BLOOD: Lipase: 17 U/L (ref 11–51)

## 2023-09-04 MED ORDER — ALUM & MAG HYDROXIDE-SIMETH 200-200-20 MG/5ML PO SUSP
30.0000 mL | Freq: Once | ORAL | Status: AC
  Administered 2023-09-04: 30 mL via ORAL
  Filled 2023-09-04: qty 30

## 2023-09-04 MED ORDER — IOHEXOL 300 MG/ML  SOLN
100.0000 mL | Freq: Once | INTRAMUSCULAR | Status: AC | PRN
  Administered 2023-09-04: 100 mL via INTRAVENOUS

## 2023-09-04 MED ORDER — ACETAMINOPHEN 500 MG PO TABS
1000.0000 mg | ORAL_TABLET | Freq: Once | ORAL | Status: AC
  Administered 2023-09-04: 1000 mg via ORAL
  Filled 2023-09-04: qty 2

## 2023-09-04 NOTE — ED Notes (Signed)
RN reviewed discharge instructions with pt. Pt verbalized understanding and had no further questions. VSS upon discharge.

## 2023-09-04 NOTE — ED Provider Triage Note (Signed)
Emergency Medicine Provider Triage Evaluation Note  Kathy Duran , a 78 y.o. female  was evaluated in triage.  Pt complains of abdominal pain.  Review of Systems  Positive:  Negative:   Physical Exam  BP (!) 158/87 (BP Location: Left Arm)   Pulse 78   Temp 98.9 F (37.2 C) (Temporal)   Resp 18   Ht 5\' 5"  (1.651 m)   Wt 95.3 kg   SpO2 99%   BMI 34.95 kg/m  Gen:   Awake, no distress   Resp:  Normal effort  MSK:   Moves extremities without difficulty  Other:    Medical Decision Making  Medically screening exam initiated at 1:30 PM.  Appropriate orders placed.  Kathy Duran was informed that the remainder of the evaluation will be completed by another provider, this initial triage assessment does not replace that evaluation, and the importance of remaining in the ED until their evaluation is complete.  Diagnosed with COVID 9 days ago. Stating that she has not been able to eat anything except for some jello/soup. States that her abdomen fills up with gas a hurts a lot.    Dorthy Cooler, [place] 09/04/23 1332

## 2023-09-04 NOTE — ED Provider Notes (Signed)
 [place] EMERGENCY DEPARTMENT AT [facility] Provider Note   CSN: [id number] Arrival date & time: 09/04/23  1118     History  Chief Complaint  Patient presents with   Abdominal Pain    Kathy Duran is a 78 y.o. female who presents with complaints of abdominal pain and indigestion.  States she tested positive for COVID on the third was started on Paxlovid.  She states she only took 3 pills due to GI upset with diarrhea.  She has endorsed intermittent nausea without emesis.  Reports that her primary prescribed Zofran.  Since then she has had constipation.  She took a stool softener and then had diarrhea again.  Denies any urinary symptoms.  Feels that her COVID symptoms are resolving.   Abdominal Pain  Past Medical History:  Diagnosis Date   Anxiety    Arthritis    left hip   Asthma    Inhalers for tx   Bilateral primary osteoarthritis of knee 07/06/2016   Moderate   Bilateral swelling of feet    bilateral ankle and feet edema   Bruises easily    Complication of anesthesia    sometimes needs oxygen when waking uo due to asthma   DDD (degenerative disc disease), lumbar 07/06/2016   Fibromyalgia    GERD (gastroesophageal reflux disease)    Hypertension    Myofacial muscle pain 07/06/2016   Osteoarthritis of both hands 07/06/2016   Pre-diabetes    Seasonal allergies 07/06/2016   Sinus complaint    Sjogren's syndrome (HCC)    dry eyes   Tenonitis    Urinary incontinence    occ. an issue wears pads       Home Medications Prior to Admission medications   Medication Sig Start Date End Date Taking? Authorizing Provider  acetaminophen (TYLENOL) 650 MG CR tablet Take 650 mg by mouth every morning.    [provider]  ADVAIR DISKUS 250-50 MCG/ACT AEPB Inhale 1 puff into the lungs 2 (two) times daily. 09/23/21   [provider]  albuterol (PROVENTIL) (2.5 MG/3ML) 0.083% nebulizer solution Take 2.5 mg by nebulization every 4 (four) hours as  needed for wheezing or shortness of breath.    [provider]  albuterol (VENTOLIN HFA) 108 (90 Base) MCG/ACT inhaler Inhale 1 puff into the lungs every 4 (four) hours as needed for wheezing or shortness of breath.    [provider]  atorvastatin (LIPITOR) 20 MG tablet Take 20 mg by mouth daily.    [provider]  cetirizine (ZYRTEC) 10 MG tablet Take 10 mg by mouth daily as needed for allergies.    [provider]  cyclobenzaprine (FLEXERIL) 10 MG tablet Take 1 tablet (10 mg total) by mouth 3 (three) times daily as needed for muscle spasms. 03/01/22   Shuford, French Ana, PA-C  estradiol (VIVELLE-DOT) 0.1 MG/24HR patch Place 1 patch onto the skin 2 (two) times a week. Sundays & Wednesdays.    [provider]  furosemide (LASIX) 20 MG tablet Take 20 mg by mouth daily.    [provider]  gabapentin (NEURONTIN) 300 MG capsule Take 300 mg by mouth at bedtime as needed (pain). 04/10/21   [provider]  losartan (COZAAR) 25 MG tablet Take 25 mg by mouth daily. 02/02/21   [provider]  Multiple Vitamin (MULTIVITAMIN WITH MINERALS) TABS tablet Take 1 tablet by mouth daily.    [provider]  ondansetron (ZOFRAN) 4 MG tablet Take 1 tablet (4 mg total)  by mouth every 8 (eight) hours as needed for nausea or vomiting. 03/01/22   Shuford, French Ana, PA-C  oxyCODONE-acetaminophen (PERCOCET) 5-325 MG tablet Take 1 tablet by mouth every 4 (four) hours as needed for severe pain (max 6 q). 03/01/22   Shuford, French Ana, PA-C  pantoprazole (PROTONIX) 40 MG tablet Take 1 tablet (40 mg total) by mouth daily. Patient taking differently: Take 40 mg by mouth daily as needed (acid reflux). 09/09/20   Marjie Skiff E, PA-C  RESTASIS 0.05 % ophthalmic emulsion Place 1 drop into both eyes 2 (two) times daily. 04/30/15   [provider]  traMADol (ULTRAM) 50 MG tablet Take 1 tablet (50 mg total) by mouth every 6 (six) hours as needed for severe  pain or moderate pain. 03/01/22   Shuford, French Ana, PA-C  YUVAFEM 10 MCG TABS vaginal tablet Place 10 mcg vaginally 2 (two) times a week. Tuesdays & Thursdays.    [provider]      Allergies    Clavulanic acid, Losartan potassium-hctz, Augmentin [amoxicillin-pot clavulanate], Diclofenac-misoprostol, Latex, and Naproxen    Review of Systems   Review of Systems  Gastrointestinal:  Positive for abdominal pain.    Physical Exam Updated Vital Signs BP (!) 167/76 (BP Location: Right Arm)   Pulse 75   Temp 98.2 F (36.8 C) (Oral)   Resp 18   Ht 5\' 5"  (1.651 m)   Wt 95.3 kg   SpO2 98%   BMI 34.95 kg/m  Physical Exam Vitals and nursing note reviewed.  Constitutional:      General: She is not in acute distress.    Appearance: She is well-developed.  HENT:     Head: Normocephalic and atraumatic.  Eyes:     Conjunctiva/sclera: Conjunctivae normal.  Cardiovascular:     Rate and Rhythm: Normal rate and regular rhythm.     Heart sounds: No murmur heard. Pulmonary:     Effort: Pulmonary effort is normal. No respiratory distress.     Breath sounds: Normal breath sounds.  Abdominal:     Palpations: Abdomen is soft.     Comments: Mild generalized abdominal tenderness without rebound or guarding, no peritoneal signs.  No CVAT  Musculoskeletal:        General: No swelling.     Cervical back: Neck supple.  Skin:    General: Skin is warm and dry.     Capillary Refill: Capillary refill takes less than 2 seconds.  Neurological:     Mental Status: She is alert.  Psychiatric:        Mood and Affect: Mood normal.     ED Results / Procedures / Treatments   Labs (all labs ordered are listed, but only abnormal results are displayed) Labs Reviewed  CBC - Abnormal; Notable for the following components:      Result Value   RDW 15.9 (*)    All other components within normal limits  URINALYSIS, ROUTINE W REFLEX MICROSCOPIC - Abnormal; Notable for the following components:    Protein, ur TRACE (*)    All other components within normal limits  LIPASE, BLOOD  COMPREHENSIVE METABOLIC PANEL    EKG None  Radiology CT ABDOMEN PELVIS W CONTRAST Result Date: 09/04/2023 CLINICAL DATA:  Abdominal pain, nausea EXAM: CT ABDOMEN AND PELVIS WITH CONTRAST TECHNIQUE: Multidetector CT imaging of the abdomen and pelvis was performed using the standard protocol following bolus administration of intravenous contrast. RADIATION DOSE REDUCTION: This exam was performed according to the departmental dose-optimization program which includes automated  exposure control, adjustment of the mA and/or kV according to patient size and/or use of iterative reconstruction technique. CONTRAST:  OMNIPAQUE IOHEXOL 300 MG/ML  SOLN COMPARISON:  06/13/2011 FINDINGS: Lower chest: Multiple foci of predominantly ground-glass opacity within the right lung base including the right lower lobe and right middle lobe. Elevation of the left hemidiaphragm with associated subsegmental atelectasis. Normal heart size. Hepatobiliary: No focal liver abnormality is seen. No gallstones, gallbladder wall thickening, or biliary dilatation. Pancreas: Unremarkable. No pancreatic ductal dilatation or surrounding inflammatory changes. Spleen: Normal in size without focal abnormality. Adrenals/Urinary Tract: Unremarkable adrenal glands. Single punctate nonobstructing stone within each kidney. No hydronephrosis. No ureteral calculi are identified. The urinary bladder and distal aspects of both ureters are obscured by streak artifact from patient's hip prostheses. Stomach/Bowel: Stomach is within normal limits. Appendix appears normal (series 2, image 57). No evidence of bowel wall thickening, distention, or inflammatory changes. Vascular/Lymphatic: Scattered aortoiliac atherosclerotic calcifications without aneurysm. No abdominopelvic lymphadenopathy. Reproductive: Uterus is surgically absent by history. No adnexal masses within the  limitations of this exam. Other: No free fluid. No abdominopelvic fluid collection. No pneumoperitoneum. Small fat containing umbilical hernia. Musculoskeletal: Bilateral total hip arthroplasties contributing to streak artifact through the low pelvis. Degenerative lumbar spondylosis. No acute bony abnormality. IMPRESSION: 1. No acute abdominopelvic findings. 2. Multiple foci of predominantly ground-glass opacity within the right lung base, suggestive of an infectious or inflammatory process. 3. Single punctate nonobstructing stone within each kidney. No hydronephrosis. 4. Small fat containing umbilical hernia. 5. Aortic atherosclerosis. Electronically Signed   By: Duanne Guess D.O.   On: 09/04/2023 16:35    Procedures Procedures    Medications Ordered in ED Medications  iohexol (OMNIPAQUE) 300 MG/ML solution 100 mL (100 mLs Intravenous Contrast Given 09/04/23 1433)  alum & mag hydroxide-simeth (MAALOX/MYLANTA) 200-200-20 MG/5ML suspension 30 mL (30 mLs Oral Given 09/04/23 1537)  acetaminophen (TYLENOL) tablet 1,000 mg (1,000 mg Oral Given 09/04/23 1537)    ED Course/ Medical Decision Making/ A&P                                 Medical Decision Making Amount and/or Complexity of Data Reviewed Labs: ordered.  Risk Prescription drug management.   This patient presents to the ED with chief complaint(s) of abdominal pain and indigestion.  The complaint involves an extensive differential diagnosis and also carries with it a high risk of complications and morbidity.   pertinent past medical history as listed in HPI  The differential diagnosis includes  gastroenteritis, colitis, small bowel obstruction, appendicitis, cholecystitis, hepatobiliary pathology, gastritis, PUD, ACS, aortic dissection, diverticulosis/diverticulitis, pancreatitis, nephrolithiasis, AAA, UTI, pyelonephritis, constipation, medication adverse effect  The initial plan is to  Basic labs and CT ordered in  triage  Additional history obtained: Records reviewed previous admission documents, Care Everywhere/External Records, and Primary Care Documents  Initial Assessment:   Patient presents hypertensive to 160/70 with complaints of abdominal pain and indigestion over the past week since taken past COVID.  She is otherwise hemodynamically stable, afebrile.  Describes intermittent constipation and diarrhea without emesis since taking Zofran and stool softener.  On exam her abdomen is nonfocal with mild generalized tenderness.  She has no urinary symptoms.  Overall most suspicious for medication adverse effect.   Independent ECG interpretation:  none  Independent labs interpretation:  The following labs were independently interpreted:  CMP unremarkable, lipase within normal limits, CBC without significant abnormality, UA with  negative nitrates, leukocytes and bacteria  Independent visualization and interpretation of imaging: I independently visualized the following imaging with scope of interpretation limited to determining acute life threatening conditions related to emergency care: CT abdomen pelvis, which revealed no acute abdominal abnormality, multiple small foci noted in the right lung base consistent with possible infectious or inflammatory process  Treatment and Reassessment: Patient given GI cocktail and Tylenol 1000 mg following first assessment  Upon reassessment patient reports notable improvement of her symptoms and she is ready to go home.  Reviewed CT findings with her and her husband.  Discussed lung findings on her right lung suggestive of possible infectious or inflammatory process.  Her lung sounds are clear.  She has had a cough over the past week.  She does not feel that it is worsening.  She states she has ample amoxicillin at home.  Given GI upset we will hold off on empiric antibiotics.  Should her respiratory symptoms persist or worsen she will take the full course of  amoxicillin and follow-up with her primary care doctor.   Consultations obtained:   None  Disposition:   Patient will be discharged home.  Encouraged to gradually progress her diet and to follow-up with her PCP by Friday if she has not improved.  The patient has been appropriately medically screened and/or stabilized in the ED. I have low suspicion for any other emergent medical condition which would require further screening, evaluation or treatment in the ED or require inpatient management. At time of discharge the patient is hemodynamically stable and in no acute distress. I have discussed work-up results and diagnosis with patient and answered all questions. Patient is agreeable with discharge plan. We discussed strict return precautions for returning to the emergency department and they verbalized understanding.     Social Determinants of Health:   None  This note was dictated with voice recognition software.  Despite best efforts at proofreading, errors may have occurred which can change the documentation meaning.          Final Clinical Impression(s) / ED Diagnoses Final diagnoses:  Generalized abdominal pain    Rx / DC Orders ED Discharge Orders     None         Halford Decamp, PA-C 09/04/23 1723    Vanetta Mulders, MD 09/05/23 1344

## 2023-09-04 NOTE — ED Triage Notes (Signed)
Recently diagnosed with covid, c/o nausea since taking plaxovid. Requesting something to help "ease her stomach". Denies any v/d.

## 2023-09-04 NOTE — Discharge Instructions (Addendum)
You were evaluated in the emergency room for abdominal pain and indigestion.  Your lab work did not show any significant abnormality.  Your CT did not show any acute abnormality in your abdomen.  As discussed it did pick up on a possible pneumonia in your lungs.  Given that your lungs sound clear that your respiratory symptoms are not worsening.  At this point I have low suspicion for pneumonia.  If your respiratory symptoms do persist or worsen I would take the full course of amoxicillin at 1000 mg by mouth 3 times a day for 5 days.  If you current symptoms have not improved by Friday please follow-up with your primary care doctor.  If you experience any new or worsening symptoms including persistent vomiting and fever worsening abdominal pain please return to the emergency room.

## 2023-09-06 DIAGNOSIS — U071 COVID-19: Secondary | ICD-10-CM | POA: Diagnosis not present

## 2023-09-11 DIAGNOSIS — Z471 Aftercare following joint replacement surgery: Secondary | ICD-10-CM | POA: Diagnosis not present

## 2023-09-11 DIAGNOSIS — Z96612 Presence of left artificial shoulder joint: Secondary | ICD-10-CM | POA: Diagnosis not present

## 2023-09-16 DIAGNOSIS — Z79899 Other long term (current) drug therapy: Secondary | ICD-10-CM | POA: Diagnosis not present

## 2023-09-16 DIAGNOSIS — R5383 Other fatigue: Secondary | ICD-10-CM | POA: Diagnosis not present

## 2023-09-16 DIAGNOSIS — M059 Rheumatoid arthritis with rheumatoid factor, unspecified: Secondary | ICD-10-CM | POA: Diagnosis not present

## 2023-09-19 DIAGNOSIS — M25561 Pain in right knee: Secondary | ICD-10-CM | POA: Diagnosis not present

## 2023-09-23 DIAGNOSIS — R0981 Nasal congestion: Secondary | ICD-10-CM | POA: Diagnosis not present

## 2023-09-30 DIAGNOSIS — M2540 Effusion, unspecified joint: Secondary | ICD-10-CM | POA: Diagnosis not present

## 2023-09-30 DIAGNOSIS — E669 Obesity, unspecified: Secondary | ICD-10-CM | POA: Diagnosis not present

## 2023-09-30 DIAGNOSIS — M1991 Primary osteoarthritis, unspecified site: Secondary | ICD-10-CM | POA: Diagnosis not present

## 2023-09-30 DIAGNOSIS — M2560 Stiffness of unspecified joint, not elsewhere classified: Secondary | ICD-10-CM | POA: Diagnosis not present

## 2023-09-30 DIAGNOSIS — M059 Rheumatoid arthritis with rheumatoid factor, unspecified: Secondary | ICD-10-CM | POA: Diagnosis not present

## 2023-09-30 DIAGNOSIS — Z8739 Personal history of other diseases of the musculoskeletal system and connective tissue: Secondary | ICD-10-CM | POA: Diagnosis not present

## 2023-09-30 DIAGNOSIS — Z6834 Body mass index (BMI) 34.0-34.9, adult: Secondary | ICD-10-CM | POA: Diagnosis not present

## 2023-10-01 DIAGNOSIS — M25612 Stiffness of left shoulder, not elsewhere classified: Secondary | ICD-10-CM | POA: Diagnosis not present

## 2023-10-01 DIAGNOSIS — M6281 Muscle weakness (generalized): Secondary | ICD-10-CM | POA: Diagnosis not present

## 2023-10-01 DIAGNOSIS — M25512 Pain in left shoulder: Secondary | ICD-10-CM | POA: Diagnosis not present

## 2023-10-03 DIAGNOSIS — M6281 Muscle weakness (generalized): Secondary | ICD-10-CM | POA: Diagnosis not present

## 2023-10-03 DIAGNOSIS — M25612 Stiffness of left shoulder, not elsewhere classified: Secondary | ICD-10-CM | POA: Diagnosis not present

## 2023-10-03 DIAGNOSIS — M25512 Pain in left shoulder: Secondary | ICD-10-CM | POA: Diagnosis not present

## 2023-10-08 DIAGNOSIS — M25612 Stiffness of left shoulder, not elsewhere classified: Secondary | ICD-10-CM | POA: Diagnosis not present

## 2023-10-08 DIAGNOSIS — M6281 Muscle weakness (generalized): Secondary | ICD-10-CM | POA: Diagnosis not present

## 2023-10-08 DIAGNOSIS — M25512 Pain in left shoulder: Secondary | ICD-10-CM | POA: Diagnosis not present

## 2023-10-15 DIAGNOSIS — M6281 Muscle weakness (generalized): Secondary | ICD-10-CM | POA: Diagnosis not present

## 2023-10-15 DIAGNOSIS — M25612 Stiffness of left shoulder, not elsewhere classified: Secondary | ICD-10-CM | POA: Diagnosis not present

## 2023-10-15 DIAGNOSIS — M25512 Pain in left shoulder: Secondary | ICD-10-CM | POA: Diagnosis not present

## 2023-10-17 DIAGNOSIS — M6281 Muscle weakness (generalized): Secondary | ICD-10-CM | POA: Diagnosis not present

## 2023-10-17 DIAGNOSIS — M25512 Pain in left shoulder: Secondary | ICD-10-CM | POA: Diagnosis not present

## 2023-10-17 DIAGNOSIS — M25612 Stiffness of left shoulder, not elsewhere classified: Secondary | ICD-10-CM | POA: Diagnosis not present

## 2023-10-22 DIAGNOSIS — M6281 Muscle weakness (generalized): Secondary | ICD-10-CM | POA: Diagnosis not present

## 2023-10-22 DIAGNOSIS — M25512 Pain in left shoulder: Secondary | ICD-10-CM | POA: Diagnosis not present

## 2023-10-22 DIAGNOSIS — M25612 Stiffness of left shoulder, not elsewhere classified: Secondary | ICD-10-CM | POA: Diagnosis not present

## 2023-10-24 DIAGNOSIS — M25512 Pain in left shoulder: Secondary | ICD-10-CM | POA: Diagnosis not present

## 2023-10-24 DIAGNOSIS — M25612 Stiffness of left shoulder, not elsewhere classified: Secondary | ICD-10-CM | POA: Diagnosis not present

## 2023-10-24 DIAGNOSIS — M6281 Muscle weakness (generalized): Secondary | ICD-10-CM | POA: Diagnosis not present

## 2023-10-29 DIAGNOSIS — M25512 Pain in left shoulder: Secondary | ICD-10-CM | POA: Diagnosis not present

## 2023-10-29 DIAGNOSIS — M6281 Muscle weakness (generalized): Secondary | ICD-10-CM | POA: Diagnosis not present

## 2023-10-29 DIAGNOSIS — M25612 Stiffness of left shoulder, not elsewhere classified: Secondary | ICD-10-CM | POA: Diagnosis not present

## 2023-11-04 DIAGNOSIS — Z0000 Encounter for general adult medical examination without abnormal findings: Secondary | ICD-10-CM | POA: Diagnosis not present

## 2023-11-04 DIAGNOSIS — Z1331 Encounter for screening for depression: Secondary | ICD-10-CM | POA: Diagnosis not present

## 2023-11-04 DIAGNOSIS — M3500 Sicca syndrome, unspecified: Secondary | ICD-10-CM | POA: Diagnosis not present

## 2023-11-04 DIAGNOSIS — E559 Vitamin D deficiency, unspecified: Secondary | ICD-10-CM | POA: Diagnosis not present

## 2023-11-04 DIAGNOSIS — G4700 Insomnia, unspecified: Secondary | ICD-10-CM | POA: Diagnosis not present

## 2023-11-04 DIAGNOSIS — I10 Essential (primary) hypertension: Secondary | ICD-10-CM | POA: Diagnosis not present

## 2023-11-04 DIAGNOSIS — E782 Mixed hyperlipidemia: Secondary | ICD-10-CM | POA: Diagnosis not present

## 2023-11-04 DIAGNOSIS — M069 Rheumatoid arthritis, unspecified: Secondary | ICD-10-CM | POA: Diagnosis not present

## 2023-11-04 DIAGNOSIS — R7303 Prediabetes: Secondary | ICD-10-CM | POA: Diagnosis not present

## 2023-11-04 DIAGNOSIS — Z23 Encounter for immunization: Secondary | ICD-10-CM | POA: Diagnosis not present

## 2023-11-04 DIAGNOSIS — N3941 Urge incontinence: Secondary | ICD-10-CM | POA: Diagnosis not present

## 2023-11-04 DIAGNOSIS — E669 Obesity, unspecified: Secondary | ICD-10-CM | POA: Diagnosis not present

## 2023-11-11 DIAGNOSIS — M059 Rheumatoid arthritis with rheumatoid factor, unspecified: Secondary | ICD-10-CM | POA: Diagnosis not present

## 2023-12-06 DIAGNOSIS — Z96652 Presence of left artificial knee joint: Secondary | ICD-10-CM | POA: Diagnosis not present

## 2023-12-06 DIAGNOSIS — G8929 Other chronic pain: Secondary | ICD-10-CM | POA: Diagnosis not present

## 2023-12-06 DIAGNOSIS — M1711 Unilateral primary osteoarthritis, right knee: Secondary | ICD-10-CM | POA: Diagnosis not present

## 2023-12-11 ENCOUNTER — Telehealth (INDEPENDENT_AMBULATORY_CARE_PROVIDER_SITE_OTHER): Payer: Self-pay | Admitting: Otolaryngology

## 2023-12-11 ENCOUNTER — Telehealth (HOSPITAL_BASED_OUTPATIENT_CLINIC_OR_DEPARTMENT_OTHER): Payer: Self-pay | Admitting: *Deleted

## 2023-12-11 NOTE — Telephone Encounter (Signed)
   Pre-operative Risk Assessment    Patient Name: Kathy Duran  DOB: June 22, 1946 MRN: [id number]   Date of last office visit: 12/13/2021 Date of next office visit: None  Request for Surgical Clearance    Procedure:  Right Total Knee Arthroplasty  Date of Surgery:  Clearance 03/03/2024                               Surgeon:  Dr. Claiborne Crew Surgeon's Group or Practice Name:  [facility] Phone number:  [phone] Fax number:  [phone]   Type of Clearance Requested:   - Medical    Type of Anesthesia:  Spinal   Additional requests/questions:    Signed, Lauris Port   12/11/2023, 4:48 PM

## 2023-12-11 NOTE — Telephone Encounter (Signed)
 Provided [address] address to patient for appt on 12/12/2023.

## 2023-12-12 ENCOUNTER — Institutional Professional Consult (permissible substitution) (INDEPENDENT_AMBULATORY_CARE_PROVIDER_SITE_OTHER): Admitting: Otolaryngology

## 2023-12-12 NOTE — Telephone Encounter (Signed)
 1st attempt to reach pt regarding surgical clearance and the need for a IN OFFICE appointment.  Left pt a message to call back and get that scheduled.

## 2023-12-12 NOTE — Telephone Encounter (Signed)
   Name: Kathy Duran  DOB: 1946-06-21  MRN: [id number]  Primary Cardiologist: Hazle Lites, MD  Chart reviewed as part of pre-operative protocol coverage. Because of Kathy Duran's past medical history and time since last visit, she will require a follow-up in-office visit in order to better assess preoperative cardiovascular risk.  Pre-op covering staff: - Please schedule appointment and call patient to inform them. If patient already had an upcoming appointment within acceptable timeframe, please add "pre-op clearance" to the appointment notes so provider is aware. - Please contact requesting surgeon's office via preferred method (i.e, phone, fax) to inform them of need for appointment prior to surgery.   Francene Ing, Retha Cast, NP  12/12/2023, 8:29 AM

## 2023-12-13 NOTE — Telephone Encounter (Signed)
 Patient has been scheduled for pre op clearance.

## 2023-12-13 NOTE — Telephone Encounter (Signed)
 Patient is returning call.

## 2023-12-13 NOTE — Telephone Encounter (Signed)
 2nd attempt to reach the pt to schedule an IN OFFICE PREOP APPT.

## 2023-12-27 DIAGNOSIS — M1990 Unspecified osteoarthritis, unspecified site: Secondary | ICD-10-CM | POA: Diagnosis not present

## 2024-01-06 DIAGNOSIS — E669 Obesity, unspecified: Secondary | ICD-10-CM | POA: Diagnosis not present

## 2024-01-06 DIAGNOSIS — M2540 Effusion, unspecified joint: Secondary | ICD-10-CM | POA: Diagnosis not present

## 2024-01-06 DIAGNOSIS — M059 Rheumatoid arthritis with rheumatoid factor, unspecified: Secondary | ICD-10-CM | POA: Diagnosis not present

## 2024-01-06 DIAGNOSIS — Z8739 Personal history of other diseases of the musculoskeletal system and connective tissue: Secondary | ICD-10-CM | POA: Diagnosis not present

## 2024-01-06 DIAGNOSIS — M2560 Stiffness of unspecified joint, not elsewhere classified: Secondary | ICD-10-CM | POA: Diagnosis not present

## 2024-01-06 DIAGNOSIS — Z6834 Body mass index (BMI) 34.0-34.9, adult: Secondary | ICD-10-CM | POA: Diagnosis not present

## 2024-01-07 DIAGNOSIS — M059 Rheumatoid arthritis with rheumatoid factor, unspecified: Secondary | ICD-10-CM | POA: Diagnosis not present

## 2024-01-07 DIAGNOSIS — Z111 Encounter for screening for respiratory tuberculosis: Secondary | ICD-10-CM | POA: Diagnosis not present

## 2024-01-07 DIAGNOSIS — Z79899 Other long term (current) drug therapy: Secondary | ICD-10-CM | POA: Diagnosis not present

## 2024-01-07 DIAGNOSIS — R5383 Other fatigue: Secondary | ICD-10-CM | POA: Diagnosis not present

## 2024-01-21 DIAGNOSIS — K219 Gastro-esophageal reflux disease without esophagitis: Secondary | ICD-10-CM | POA: Diagnosis not present

## 2024-01-21 DIAGNOSIS — R143 Flatulence: Secondary | ICD-10-CM | POA: Diagnosis not present

## 2024-01-21 DIAGNOSIS — D649 Anemia, unspecified: Secondary | ICD-10-CM | POA: Diagnosis not present

## 2024-01-29 DIAGNOSIS — M25561 Pain in right knee: Secondary | ICD-10-CM | POA: Diagnosis not present

## 2024-02-04 DIAGNOSIS — M059 Rheumatoid arthritis with rheumatoid factor, unspecified: Secondary | ICD-10-CM | POA: Diagnosis not present

## 2024-02-04 DIAGNOSIS — Z79899 Other long term (current) drug therapy: Secondary | ICD-10-CM | POA: Diagnosis not present

## 2024-02-11 ENCOUNTER — Encounter: Payer: Self-pay | Admitting: Nurse Practitioner

## 2024-02-11 ENCOUNTER — Ambulatory Visit: Attending: Cardiology | Admitting: Nurse Practitioner

## 2024-02-11 VITALS — BP 138/88 | HR 86 | Ht 65.5 in | Wt 210.0 lb

## 2024-02-11 DIAGNOSIS — Z01810 Encounter for preprocedural cardiovascular examination: Secondary | ICD-10-CM | POA: Diagnosis not present

## 2024-02-11 DIAGNOSIS — R600 Localized edema: Secondary | ICD-10-CM | POA: Diagnosis not present

## 2024-02-11 DIAGNOSIS — I10 Essential (primary) hypertension: Secondary | ICD-10-CM

## 2024-02-11 DIAGNOSIS — I517 Cardiomegaly: Secondary | ICD-10-CM

## 2024-02-11 DIAGNOSIS — E782 Mixed hyperlipidemia: Secondary | ICD-10-CM

## 2024-02-11 DIAGNOSIS — I5189 Other ill-defined heart diseases: Secondary | ICD-10-CM | POA: Diagnosis not present

## 2024-02-11 NOTE — Patient Instructions (Signed)
  Follow-Up: At [facility], you and your health needs are our priority.  As part of our continuing mission to provide you with exceptional heart care, our providers are all part of one team.  This team includes your primary Cardiologist (physician) and Advanced Practice Providers or APPs (Physician Assistants and Nurse Practitioners) who all work together to provide you with the care you need, when you need it.  Your next appointment:   1 year(s)  Provider:   Vinie JAYSON Maxcy, MD      Other Instructions Preop Clearance will be sent to your surgeon's office

## 2024-02-11 NOTE — Progress Notes (Unsigned)
 Office Visit    Patient Name: Kathy Duran Date of Encounter: 02/11/2024  Primary Care Provider:  Ransom Other, MD Primary Cardiologist:  Vinie JAYSON Maxcy, MD  Chief Complaint    78 year old female with a history of diastolic dysfunction, mild LVH, chronic bilateral lower extremity edema, atypical chest pain, hypertension, hyperlipidemia, DDD, fibromyalgia, rheumatoid arthritis, Sjogren syndrome, asthma, and GERD who presents for follow-up and for preoperative cardiac evaluation.  Past Medical History    Past Medical History:  Diagnosis Date   Anxiety    Arthritis    left hip   Asthma    Inhalers for tx   Bilateral primary osteoarthritis of knee 07/06/2016   Moderate   Bilateral swelling of feet    bilateral ankle and feet edema   Bruises easily    Complication of anesthesia    sometimes needs oxygen  when waking uo due to asthma   DDD (degenerative disc disease), lumbar 07/06/2016   Fibromyalgia    GERD (gastroesophageal reflux disease)    Hypertension    Myofacial muscle pain 07/06/2016   Osteoarthritis of both hands 07/06/2016   Pre-diabetes    Seasonal allergies 07/06/2016   Sinus complaint    Sjogren's syndrome (HCC)    dry eyes   Tenonitis    Urinary incontinence    occ. an issue wears pads   Past Surgical History:  Procedure Laterality Date   ABDOMINAL HYSTERECTOMY  1986   BACK SURGERY  1985   BREAST CYST ASPIRATION     BREAST SURGERY Right    cyst drainage   CARPAL TUNNEL RELEASE Right 2001   KNEE SURGERY  1991   REVERSE SHOULDER ARTHROPLASTY Left 03/01/2022   Procedure: REVERSE SHOULDER ARTHROPLASTY;  Surgeon: Melita Drivers, MD;  Location: [facility];  Service: Orthopedics;  Laterality: Left;    SHOULDER SURGERY Right    right shoulder tendon and rotator cuff repair   TOTAL HIP ARTHROPLASTY Right 2006   TOTAL HIP ARTHROPLASTY Left 02/24/2016   Procedure: LEFT TOTAL HIP ARTHROPLASTY ANTERIOR APPROACH;  Surgeon: Lonni CINDERELLA Poli, MD;   Location: [facility];  Service: Orthopedics;  Laterality: Left;   TOTAL KNEE ARTHROPLASTY Left 11/16/2019   Procedure: TOTAL KNEE ARTHROPLASTY;  Surgeon: Melodi Lerner, MD;  Location: [facility];  Service: Orthopedics;  Laterality: Left;     Allergies  Allergies  Allergen Reactions   Clavulanic Acid    Losartan  Potassium-Hctz Other (See Comments)    HEADACHES   Augmentin  [Amoxicillin -Pot Clavulanate] Rash   Diclofenac-Misoprostol Itching    Arthrotec   Latex Itching   Naproxen Nausea Only     Labs/Other Studies Reviewed    The following studies were reviewed today:  Cardiac Studies & Procedures   ______________________________________________________________________________________________   STRESS TESTS  MYOCARDIAL PERFUSION IMAGING 09/27/2020  Interpretation Summary  The left ventricular ejection fraction is normal (55-65%).  Nuclear stress EF: 65%.  There was no ST segment deviation noted during stress.  The study is normal.  This is a low risk study.  Normal stress nuclear study with no ischemia or infarction.  Gated ejection fraction 65% with normal wall motion.   ECHOCARDIOGRAM  ECHOCARDIOGRAM COMPLETE 10/04/2020  Narrative ECHOCARDIOGRAM REPORT    Patient Name:   Kathy Duran Date of Exam: 10/04/2020 Medical Rec #:  [id number]      Height:       65.0 in Accession #:    [id number]     Weight:       217.0 lb Date  of Birth:  24-Jan-1946      BSA:          2.048 m Patient Age:    74 years       BP:           120/88 mmHg Patient Gender: F              HR:           87 bpm. Exam Location:  [address]  Procedure: 2D Echo, 3D Echo, Cardiac Doppler and Color Doppler  Indications:    R06.00 Dyspnea  History:        Patient has prior history of Echocardiogram examinations, most recent 05/09/2015. Signs/Symptoms:Chest Pain and Dyspnea; Risk Factors:Hypertension, Dyslipidemia, Family History of Coronary Artery Disease and Former Smoker. Edema, Sjogren's  Syndrome, Palpitations.  Sonographer:    Heather Hawks RDCS Referring Phys: [id number] CALLIE E GOODRICH  IMPRESSIONS   1. Moderate septal hypertrophy with otherwise mild concentric LVH. Left ventricular ejection fraction, by estimation, is 60 to 65%. The left ventricle has normal function. The left ventricle has no regional wall motion abnormalities. There is moderate asymmetric left ventricular hypertrophy. Left ventricular diastolic parameters are consistent with Grade I diastolic dysfunction (impaired relaxation). 2. Right ventricular systolic function is normal. The right ventricular size is normal. There is mildly elevated pulmonary artery systolic pressure. 3. The mitral valve is normal in structure. No evidence of mitral valve regurgitation. No evidence of mitral stenosis. 4. The aortic valve is tricuspid. Aortic valve regurgitation is mild. No aortic stenosis is present. Aortic regurgitation PHT measures 344 msec. 5. The inferior vena cava is dilated in size with <50% respiratory variability, suggesting right atrial pressure of 15 mmHg.  FINDINGS Left Ventricle: Moderate septal hypertrophy with otherwise mild concentric LVH. Left ventricular ejection fraction, by estimation, is 60 to 65%. The left ventricle has normal function. The left ventricle has no regional wall motion abnormalities. The left ventricular internal cavity size was normal in size. There is moderate asymmetric left ventricular hypertrophy. Left ventricular diastolic parameters are consistent with Grade I diastolic dysfunction (impaired relaxation). Indeterminate filling pressures.  Right Ventricle: The right ventricular size is normal. No increase in right ventricular wall thickness. Right ventricular systolic function is normal. There is mildly elevated pulmonary artery systolic pressure. The tricuspid regurgitant velocity is 2.34 m/s, and with an assumed right atrial pressure of 15 mmHg, the estimated right  ventricular systolic pressure is 36.9 mmHg.  Left Atrium: Left atrial size was normal in size.  Right Atrium: Right atrial size was normal in size.  Pericardium: There is no evidence of pericardial effusion.  Mitral Valve: The mitral valve is normal in structure. No evidence of mitral valve regurgitation. No evidence of mitral valve stenosis.  Tricuspid Valve: The tricuspid valve is normal in structure. Tricuspid valve regurgitation is trivial. No evidence of tricuspid stenosis.  Aortic Valve: The aortic valve is tricuspid. Aortic valve regurgitation is mild. Aortic regurgitation PHT measures 344 msec. No aortic stenosis is present.  Pulmonic Valve: The pulmonic valve was normal in structure. Pulmonic valve regurgitation is not visualized. No evidence of pulmonic stenosis.  Aorta: The aortic root is normal in size and structure.  Venous: The inferior vena cava is dilated in size with less than 50% respiratory variability, suggesting right atrial pressure of 15 mmHg.  IAS/Shunts: No atrial level shunt detected by color flow Doppler.   LEFT VENTRICLE PLAX 2D LVIDd:         3.70 cm  Diastology  LVIDs:         1.70 cm  LV e' medial:    10.20 cm/s LV PW:         1.20 cm  LV E/e' medial:  12.4 LV IVS:        1.60 cm  LV e' lateral:   9.32 cm/s LVOT diam:     2.00 cm  LV E/e' lateral: 13.5 LV SV:         76 LV SV Index:   37 LVOT Area:     3.14 cm  3D Volume EF: 3D EF:        67 % LV EDV:       154 ml LV ESV:       51 ml LV SV:        103 ml  RIGHT VENTRICLE RV S prime:     17.20 cm/s TAPSE (M-mode): 2.6 cm  LEFT ATRIUM             Index       RIGHT ATRIUM          Index LA diam:        3.80 cm 1.86 cm/m  RA Area:     9.13 cm LA Vol (A2C):   44.1 ml 21.53 ml/m RA Volume:   17.20 ml 8.40 ml/m LA Vol (A4C):   55.2 ml 26.95 ml/m LA Biplane Vol: 51.8 ml 25.29 ml/m AORTIC VALVE LVOT Vmax:   117.00 cm/s LVOT Vmean:  66.100 cm/s LVOT VTI:    0.243 m AI PHT:      344  msec  AORTA Ao Root diam: 3.30 cm Ao Asc diam:  3.40 cm  MITRAL VALVE                TRICUSPID VALVE MV Area (PHT): cm          TR Peak grad:   21.9 mmHg MV Decel Time: 163 msec     TR Vmax:        234.00 cm/s MV E velocity: 126.00 cm/s MV A velocity: 113.00 cm/s  SHUNTS MV E/A ratio:  1.12         Systemic VTI:  0.24 m Systemic Diam: 2.00 cm  Annabella Scarce MD Electronically signed by Annabella Scarce MD Signature Date/Time: 10/04/2020/6:35:24 PM    Final      CT SCANS  CT CARDIAC SCORING (SELF PAY ONLY) 04/26/2021  Addendum 04/26/2021  7:03 PM ADDENDUM REPORT: 04/26/2021 19:00  CLINICAL DATA:  Cardiovascular Disease Risk stratification  EXAM: Coronary Calcium  Score  TECHNIQUE: A gated, non-contrast computed tomography scan of the heart was performed using 3mm slice thickness. Axial images were analyzed on a dedicated workstation. Calcium  scoring of the coronary arteries was performed using the Agatston method.  FINDINGS: Coronary arteries: Normal origins.  Coronary Calcium  Score:  Left main: 0  Left anterior descending artery: 0  Left circumflex artery: 0  Right coronary artery: 0  Total: 0  Percentile: 0  Pericardium: Normal.  Ascending Aorta: Normal caliber.  Non-cardiac: See separate report from [facility].  IMPRESSION: Coronary calcium  score of 0. This was 0 percentile for age-, race-, and sex-matched controls.  RECOMMENDATIONS: Coronary artery calcium  (CAC) score is a strong predictor of incident coronary heart disease (CHD) and provides predictive information beyond traditional risk factors. CAC scoring is reasonable to use in the decision to withhold, postpone, or initiate statin therapy in intermediate-risk or selected borderline-risk asymptomatic adults (age 61-75 years and LDL-C >=70 to <  190 mg/dL) who do not have diabetes or established atherosclerotic cardiovascular disease (ASCVD).* In intermediate-risk (10-year  ASCVD risk >=7.5% to <20%) adults or selected borderline-risk (10-year ASCVD risk >=5% to <7.5%) adults in whom a CAC score is measured for the purpose of making a treatment decision the following recommendations have been made:  If CAC=0, it is reasonable to withhold statin therapy and reassess in 5 to 10 years, as long as higher risk conditions are absent (diabetes mellitus, family history of premature CHD in first degree relatives (males <55 years; females <65 years), cigarette smoking, or LDL >=190 mg/dL).  If CAC is 1 to 99, it is reasonable to initiate statin therapy for patients >=47 years of age.  If CAC is >=100 or >=75th percentile, it is reasonable to initiate statin therapy at any age.  Cardiology referral should be considered for patients with CAC scores >=400 or >=75th percentile.  *2018 AHA/ACC/AACVPR/AAPA/ABC/ACPM/ADA/AGS/APhA/ASPC/NLA/PCNA Guideline on the Management of Blood Cholesterol: A Report of the American College of Cardiology/American Heart Association Task Force on Clinical Practice Guidelines. J Am Coll Cardiol. 2019;73(24):3168-3209.  Kardie Tobb, DO   Electronically Signed By: Kardie  Tobb D.O. On: 04/26/2021 19:00  Narrative EXAM: OVER-READ INTERPRETATION  CT CHEST  The following report is an over-read performed by radiologist Dr. Toribio Aye of [facility] on 04/26/2021. This over-read does not include interpretation of cardiac or coronary anatomy or pathology. The coronary calcium  score/coronary CTA interpretation by the cardiologist is attached.  COMPARISON:  None.  FINDINGS: Within the visualized portions of the thorax there are no suspicious appearing pulmonary nodules or masses, there is no acute consolidative airspace disease, no pleural effusions, no pneumothorax and no lymphadenopathy. Visualized portions of the upper abdomen are unremarkable. There are no aggressive appearing lytic or blastic lesions noted in  the visualized portions of the skeleton.  IMPRESSION: 1. No significant incidental noncardiac findings are noted.  Electronically Signed: By: Toribio Aye M.D. On: 04/26/2021 11:07     ______________________________________________________________________________________________     Recent Labs: 09/04/2023: ALT 23; BUN 11; Creatinine, Ser 0.60; Hemoglobin 12.7; Platelets 296; Potassium 4.0; Sodium 140  Recent Lipid Panel    Component Value Date/Time   CHOL 245 (H) 04/25/2015 0955   CHOL 212 (H) 12/01/2013 1142   TRIG 47 04/25/2015 0955   TRIG 83 12/01/2013 1142   HDL 107 04/25/2015 0955   HDL 81 12/01/2013 1142   CHOLHDL 2.3 04/25/2015 0955   LDLCALC 129 04/25/2015 0955   LDLCALC 114 (H) 12/01/2013 1142    History of Present Illness    78 year old female with the above past medical history including diastolic dysfunction, mild LVH, chronic bilateral lower extremity edema, atypical chest pain, hypertension, hyperlipidemia, DDD, fibromyalgia, rheumatoid arthritis, Sjogren syndrome, asthma, and GERD.  She was initially evaluated by Dr. Mona in 2015 in the setting of bilateral lower extremity edema.  She was started on HCTZ.  Echocardiogram in 2016 showed EF 60 to 65%, mild LVH, G1 DD.  It was noted that her lower extremity edema was not likely due to cardiac etiology.  Compression stockings were recommended.  Repeat echocardiogram in 2022 showed EF 60 to 65%, moderate septal hypertrophy, mild concentric LVH, G1 DD.  Lexiscan  Myoview  in 2022 was low risk no evidence of ischemia or infarction.  Coronary calcium  score in 04/2021 was 0.  He was last in the office on 10/13/2021 and was stable from a cardiac standpoint.  She was seen virtually on 12/13/2021 for preoperative cardiac evaluation.  She  presents today for follow-up accompanied by her husband and for preoperative cardiac evaluation for upcoming right total knee arthroplasty on 03/03/2024 with Dr. Donnice Car of [facility].  Since her last visit Activity is limited in the setting of knee pain, which has led to overall physical deconditioning.  She does not have any concerning heart related symptoms.  Reviewed with Dr. Mona, primary cardiologist, who agrees she does not need any additional cardiac testing prior to her procedure.  Follow-up in 1 year. 1. Chronic bilateral lower extremity edema/LVH/diastolic dysfunction:   Most recent echocardiogram in 2022 showed EF 60 to 65%, moderate septal hypertrophy, mild concentric LVH, G1 DD.  Euvolemic and well compensated on exam. 2. Atypical chest pain: Lexiscan  Myoview  in 2022 was low risk no evidence of ischemia or infarction. Coronary calcium  score in 04/2021 was 0.  Stable with no anginal symptoms. No indication for ischemic evaluation.  3. Hypertension: BP well controlled. Continue current antihypertensive regimen.  4. Hyperlipidemia: LDL was 102 in 10/2023. Continue Lipitor. 5. Preoperative cardiac exam: According to the Revised Cardiac Risk Index (RCRI), her Perioperative Risk of Major Cardiac Event is (%): 0.4. Her Functional Capacity in METs is: 3.97 according to the [facility] Activity Status Index (DASI). 6. Disposition: Follow-up in 1 year.   Home Medications    Current Outpatient Medications  Medication Sig Dispense Refill   acetaminophen  (TYLENOL ) 650 MG CR tablet Take 650 mg by mouth every morning.     ADVAIR DISKUS 250-50 MCG/ACT AEPB Inhale 1 puff into the lungs 2 (two) times daily.     albuterol  (PROVENTIL ) (2.5 MG/3ML) 0.083% nebulizer solution Take 2.5 mg by nebulization every 4 (four) hours as needed for wheezing or shortness of breath.     albuterol  (VENTOLIN  HFA) 108 (90 Base) MCG/ACT inhaler Inhale 1 puff into the lungs every 4 (four) hours as needed for wheezing or shortness of breath.     atorvastatin  (LIPITOR) 20 MG tablet Take 20 mg by mouth daily.     cetirizine (ZYRTEC) 10 MG tablet Take 10 mg by mouth daily as needed for allergies.     estradiol  (VIVELLE-DOT) 0.05 MG/24HR patch Place 1 patch onto the skin 2 (two) times a week.     folic acid (FOLVITE) 1 MG tablet Take 1 mg by mouth daily.     gabapentin  (NEURONTIN ) 300 MG capsule Take 300 mg by mouth at bedtime as needed (pain).     inFLIXimab  (REMICADE ) 100 MG injection Infuse 3mg /kg Intravenous 8w     losartan  (COZAAR ) 25 MG tablet Take 25 mg by mouth daily.     methotrexate (RHEUMATREX) 2.5 MG tablet Take 2.5 mg by mouth once a week. Takes 8 tabs weekly     Multiple Vitamin (MULTIVITAMIN WITH MINERALS) TABS tablet Take 1 tablet by mouth daily.     ondansetron  (ZOFRAN ) 4 MG tablet Take 1 tablet (4 mg total) by mouth every 8 (eight) hours as needed for nausea or vomiting. 10 tablet 0   pantoprazole  (PROTONIX ) 40 MG tablet Take 1 tablet (40 mg total) by mouth daily. 30 tablet 2   RESTASIS  0.05 % ophthalmic emulsion Place 1 drop into both eyes 2 (two) times daily.     traMADol  (ULTRAM ) 50 MG tablet Take 1 tablet (50 mg total) by mouth every 6 (six) hours as needed for severe pain or moderate pain. 20 tablet 0   YUVAFEM 10 MCG TABS vaginal tablet Place 10 mcg vaginally 2 (two) times a week. Tuesdays & Thursdays.  cyclobenzaprine  (FLEXERIL ) 10 MG tablet Take 1 tablet (10 mg total) by mouth 3 (three) times daily as needed for muscle spasms. (Patient not taking: Reported on 02/11/2024) 30 tablet 1   estradiol (VIVELLE-DOT) 0.1 MG/24HR patch Place 1 patch onto the skin 2 (two) times a week. Sundays & Wednesdays. (Patient not taking: Reported on 02/11/2024)     furosemide  (LASIX ) 20 MG tablet Take 20 mg by mouth daily. (Patient not taking: Reported on 02/11/2024)     oxyCODONE -acetaminophen  (PERCOCET) 5-325 MG tablet Take 1 tablet by mouth every 4 (four) hours as needed for severe pain (max 6 q). (Patient not taking: Reported on 02/11/2024) 15 tablet 0   No current facility-administered medications for this visit.     Review of Systems    ***.  All other systems reviewed and are otherwise  negative except as noted above.    Physical Exam    VS:  BP 138/88 (BP Location: Left Arm, Patient Position: Sitting, Cuff Size: Large)   Ht 5' 5.5 (1.664 m)   Wt 210 lb (95.3 kg)   SpO2 97%   BMI 34.41 kg/m  GEN: Well nourished, well developed, in no acute distress. HEENT: normal. Neck: Supple, no JVD, carotid bruits, or masses. Cardiac: RRR, no murmurs, rubs, or gallops. No clubbing, cyanosis, edema.  Radials/DP/PT 2+ and equal bilaterally.  Respiratory:  Respirations regular and unlabored, clear to auscultation bilaterally. GI: Soft, nontender, nondistended, BS + x 4. MS: no deformity or atrophy. Skin: warm and dry, no rash. Neuro:  Strength and sensation are intact. Psych: Normal affect.  Accessory Clinical Findings    ECG personally reviewed by me today - EKG Interpretation Date/Time:  Tuesday February 11 2024 14:18:11 EDT Ventricular Rate:  86 PR Interval:  154 QRS Duration:  86 QT Interval:  370 QTC Calculation: 442 R Axis:   6  Text Interpretation: Normal sinus rhythm Normal ECG No previous ECGs available Confirmed by Daneen Perkins ([id number]) on 02/11/2024 2:22:09 PM  - no acute changes.   Lab Results  Component Value Date   WBC 6.0 09/04/2023   HGB 12.7 09/04/2023   HCT 39.5 09/04/2023   MCV 90.2 09/04/2023   PLT 296 09/04/2023   Lab Results  Component Value Date   CREATININE 0.60 09/04/2023   BUN 11 09/04/2023   NA 140 09/04/2023   K 4.0 09/04/2023   CL 104 09/04/2023   CO2 28 09/04/2023   Lab Results  Component Value Date   ALT 23 09/04/2023   AST 19 09/04/2023   ALKPHOS 101 09/04/2023   BILITOT 0.5 09/04/2023   Lab Results  Component Value Date   CHOL 245 (H) 04/25/2015   HDL 107 04/25/2015   LDLCALC 129 04/25/2015   TRIG 47 04/25/2015   CHOLHDL 2.3 04/25/2015    Lab Results  Component Value Date   HGBA1C 5.4 02/19/2022    Assessment & Plan    1.  ***      Perkins JAYSON Daneen, NP 02/11/2024, 2:24 PM

## 2024-02-12 ENCOUNTER — Encounter: Payer: Self-pay | Admitting: Nurse Practitioner

## 2024-02-18 DIAGNOSIS — M059 Rheumatoid arthritis with rheumatoid factor, unspecified: Secondary | ICD-10-CM | POA: Diagnosis not present

## 2024-02-19 NOTE — Patient Instructions (Addendum)
 SURGICAL WAITING ROOM VISITATION Patients having surgery or a procedure may have no more than 2 support people in the waiting area - these visitors may rotate.    Children under the age of 52 must have an adult with them who is not the patient.  If the patient needs to stay at the hospital during part of their recovery, the visitor guidelines for inpatient rooms apply. Pre-op nurse will coordinate an appropriate time for 1 support person to accompany patient in pre-op.  This support person may not rotate.    Please refer to the [facility] website for the visitor guidelines for Inpatients (after your surgery is over and you are in a regular room).       Your procedure is scheduled on: 03-03-24   Report to [facility] Main Entrance    Report to admitting at 7:20 AM   Call this number if you have problems the morning of surgery [phone]   Do not eat food :After Midnight.   After Midnight you may have the following liquids until 6:45 AM DAY OF SURGERY  Water  Non-Citrus Juices (without pulp, NO RED-Apple, White grape, White cranberry) Black Coffee (NO MILK/CREAM OR CREAMERS, sugar ok)  Clear Tea (NO MILK/CREAM OR CREAMERS, sugar ok) regular and decaf                             Plain Jell-O (NO RED)                                           Fruit ices (not with fruit pulp, NO RED)                                     Popsicles (NO RED)                                                               Sports drinks like Gatorade (NO RED)                   The day of surgery:  Drink ONE (1) Pre-Surgery G2 by 6:45 AM the morning of surgery. Drink in one sitting. Do not sip.  This drink was given to you during your hospital  pre-op appointment visit. Nothing else to drink after completing the Pre-Surgery  G2.          If you have questions, please contact your surgeon's office.   FOLLOW  ANY ADDITIONAL PRE OP INSTRUCTIONS YOU RECEIVED FROM YOUR SURGEON'S OFFICE!!!      Oral Hygiene is also important to reduce your risk of infection.                                    Remember - BRUSH YOUR TEETH THE MORNING OF SURGERY WITH YOUR REGULAR TOOTHPASTE   Do NOT smoke after Midnight   Take these medicines the morning of surgery with A SIP OF WATER :    Atorvastatin    Pantoprazole   Zyrtec   If needed Tylenol , Ondansetron    Okay to use inhalers, eyedrops  Stop all vitamins and herbal supplements 7 days before surgery                              You may not have any metal on your body including hair pins, jewelry, and body piercing             Do not wear make-up, lotions, powders, perfumes or deodorant  Do not wear nail polish including gel and S&S, artificial/acrylic nails, or any other type of covering on natural nails including finger and toenails. If you have artificial nails, gel coating, etc. that needs to be removed by a nail salon please have this removed prior to surgery or surgery may need to be canceled/ delayed if the surgeon/ anesthesia feels like they are unable to be safely monitored.   Do not shave  48 hours prior to surgery.             Do not bring valuables to the hospital. [facility] IS NOT RESPONSIBLE   FOR VALUABLES.   Contacts, dentures or bridgework may not be worn into surgery.   Bring small overnight bag day of surgery.   DO NOT BRING YOUR HOME MEDICATIONS TO THE HOSPITAL. PHARMACY WILL DISPENSE MEDICATIONS LISTED ON YOUR MEDICATION LIST TO YOU DURING YOUR ADMISSION IN THE HOSPITAL!              Please read over the following fact sheets you were given: IF YOU HAVE QUESTIONS ABOUT YOUR PRE-OP INSTRUCTIONS PLEASE CALL [phone] Gwen  If you received a COVID test during your pre-op visit  it is requested that you wear a mask when out in public, stay away from anyone that may not be feeling well and notify your surgeon if you develop symptoms. If you test positive for Covid or have been in contact with anyone that has tested  positive in the last 10 days please notify you surgeon.   Pre-operative 5 CHG Bath Instructions   You can play a key role in reducing the risk of infection after surgery. Your skin needs to be as free of germs as possible. You can reduce the number of germs on your skin by washing with CHG (chlorhexidine  gluconate) soap before surgery. CHG is an antiseptic soap that kills germs and continues to kill germs even after washing.   DO NOT use if you have an allergy to chlorhexidine /CHG or antibacterial soaps. If your skin becomes reddened or irritated, stop using the CHG and notify one of our RNs at [phone].   Please shower with the CHG soap starting 4 days before surgery using the following schedule:     Please keep in mind the following:  DO NOT shave, including legs and underarms, starting the day of your first shower.   You may shave your face at any point before/day of surgery.  Place clean sheets on your bed the day you start using CHG soap. Use a clean washcloth (not used since being washed) for each shower. DO NOT sleep with pets once you start using the CHG.   CHG Shower Instructions:  If you choose to wash your hair and private area, wash first with your normal shampoo/soap.  After you use shampoo/soap, rinse your hair and body thoroughly to remove shampoo/soap residue.  Turn the water  OFF and apply about 3 tablespoons (45 ml) of CHG soap  to a [employer].  Apply CHG soap ONLY FROM YOUR NECK DOWN TO YOUR TOES (washing for 3-5 minutes)  DO NOT use CHG soap on face, private areas, open wounds, or sores.  Pay special attention to the area where your surgery is being performed.  If you are having back surgery, having someone wash your back for you may be helpful. Wait 2 minutes after CHG soap is applied, then you may rinse off the CHG soap.  Pat dry with a clean towel  Put on clean clothes/pajamas   If you choose to wear lotion, please use ONLY the CHG-compatible lotions on  the back of this paper.     Additional instructions for the day of surgery: DO NOT APPLY any lotions, deodorants, cologne, or perfumes.   Put on clean/comfortable clothes.  Brush your teeth.  Ask your nurse before applying any prescription medications to the skin.      CHG Compatible Lotions   Aveeno Moisturizing lotion  Cetaphil Moisturizing Cream  Cetaphil Moisturizing Lotion  Clairol Herbal Essence Moisturizing Lotion, Dry Skin  Clairol Herbal Essence Moisturizing Lotion, Extra Dry Skin  Clairol Herbal Essence Moisturizing Lotion, Normal Skin  Curel Age Defying Therapeutic Moisturizing Lotion with Alpha Hydroxy  Curel Extreme Care Body Lotion  Curel Soothing Hands Moisturizing Hand Lotion  Curel Therapeutic Moisturizing Cream, Fragrance-Free  Curel Therapeutic Moisturizing Lotion, Fragrance-Free  Curel Therapeutic Moisturizing Lotion, Original Formula  Eucerin Daily Replenishing Lotion  Eucerin Dry Skin Therapy Plus Alpha Hydroxy Crme  Eucerin Dry Skin Therapy Plus Alpha Hydroxy Lotion  Eucerin Original Crme  Eucerin Original Lotion  Eucerin Plus Crme Eucerin Plus Lotion  Eucerin TriLipid Replenishing Lotion  Keri Anti-Bacterial Hand Lotion  Keri Deep Conditioning Original Lotion Dry Skin Formula Softly Scented  Keri Deep Conditioning Original Lotion, Fragrance Free Sensitive Skin Formula  Keri Lotion Fast Absorbing Fragrance Free Sensitive Skin Formula  Keri Lotion Fast Absorbing Softly Scented Dry Skin Formula  Keri Original Lotion  Keri Skin Renewal Lotion Keri Silky Smooth Lotion  Keri Silky Smooth Sensitive Skin Lotion  Nivea Body Creamy Conditioning Oil  Nivea Body Extra Enriched Lotion  Nivea Body Original Lotion  Nivea Body Sheer Moisturizing Lotion Nivea Crme  Nivea Skin Firming Lotion  NutraDerm 30 Skin Lotion  NutraDerm Skin Lotion  NutraDerm Therapeutic Skin Cream  NutraDerm Therapeutic Skin Lotion  ProShield Protective Hand Cream  Provon  moisturizing lotion   PATIENT SIGNATURE_________________________________  NURSE SIGNATURE__________________________________  ________________________________________________________________________    Kathy Duran  An incentive spirometer is a tool that can help keep your lungs clear and active. This tool measures how well you are filling your lungs with each breath. Taking long deep breaths may help reverse or decrease the chance of developing breathing (pulmonary) problems (especially infection) following: A long period of time when you are unable to move or be active. BEFORE THE PROCEDURE  If the spirometer includes an indicator to show your best effort, your nurse or respiratory therapist will set it to a desired goal. If possible, sit up straight or lean slightly forward. Try not to slouch. Hold the incentive spirometer in an upright position. INSTRUCTIONS FOR USE  Sit on the edge of your bed if possible, or sit up as far as you can in bed or on a chair. Hold the incentive spirometer in an upright position. Breathe out normally. Place the mouthpiece in your mouth and seal your lips tightly around it. Breathe in slowly and as deeply as possible, raising the piston or the  ball toward the top of the column. Hold your breath for 3-5 seconds or for as long as possible. Allow the piston or ball to fall to the bottom of the column. Remove the mouthpiece from your mouth and breathe out normally. Rest for a few seconds and repeat Steps 1 through 7 at least 10 times every 1-2 hours when you are awake. Take your time and take a few normal breaths between deep breaths. The spirometer may include an indicator to show your best effort. Use the indicator as a goal to work toward during each repetition. After each set of 10 deep breaths, practice coughing to be sure your lungs are clear. If you have an incision (the cut made at the time of surgery), support your incision when coughing by  placing a pillow or rolled up towels firmly against it. Once you are able to get out of bed, walk around indoors and cough well. You may stop using the incentive spirometer when instructed by your caregiver.  RISKS AND COMPLICATIONS Take your time so you do not get dizzy or light-headed. If you are in pain, you may need to take or ask for pain medication before doing incentive spirometry. It is harder to take a deep breath if you are having pain. AFTER USE Rest and breathe slowly and easily. It can be helpful to keep track of a log of your progress. Your caregiver can provide you with a simple table to help with this. If you are using the spirometer at home, follow these instructions: SEEK MEDICAL CARE IF:  You are having difficultly using the spirometer. You have trouble using the spirometer as often as instructed. Your pain medication is not giving enough relief while using the spirometer. You develop fever of 100.5 F (38.1 C) or higher. SEEK IMMEDIATE MEDICAL CARE IF:  You cough up bloody sputum that had not been present before. You develop fever of 102 F (38.9 C) or greater. You develop worsening pain at or near the incision site. MAKE SURE YOU:  Understand these instructions. Will watch your condition. Will get help right away if you are not doing well or get worse. Document Released: 11/19/2006 Document Revised: 10/01/2011 Document Reviewed: 01/20/2007 [facility] Patient Information 2014 [place], [place].

## 2024-02-19 NOTE — Progress Notes (Addendum)
  Date of COVID positive in last 90 days:  No  PCP - Ardell Manly, MD (note in [facility]) Cardiologist - Vinie Maxcy, MD Rheumatologist - Mardy Blas  Cardiac clearance in Epic dated 02-11-24  Chest x-ray - N/A EKG - 02-11-24 Epic Stress Test - 09-27-20 epic ECHO - 10-04-20 Epic Cardiac Cath - N/A Pacemaker/ICD device last checked: N/A Spinal Cord Stimulator: N/A Cardiac CT - 04-26-21 Epic  Bowel Prep - N/A  Sleep Study - Yes, neg sleep apnea CPAP - No  Prediabetes Fasting Blood Sugar -  Checks Blood Sugar- does not check   Last dose of GLP1 agonist-  N/A GLP1 instructions:  Do not take after     Last dose of SGLT-2 inhibitors-  N/A SGLT-2 instructions:  Do not take after    Blood Thinner Instructions:  N/A Aspirin  Instructions: Last Dose:  Activity level:  Difficulty climbing stairs due to severe knee pain.  Able to perform activities of daily living without stopping and without symptoms of chest pain or shortness of breath.  Anesthesia review:  Diastolic dysfunction, LVH, hx of atypical chest pain, HTN, fibromyalgia, rheumatoid arthritis  BP elevated at preop 171/82 and on recheck 168/85.  Patient denied headache, chest pain or SOB.  States that she was in a lot of pain at preop.  Will monitor at home and call PCP if it remains elevated.  Patient denies shortness of breath, fever, cough and chest pain at PAT appointment  Patient verbalized understanding of instructions that were given to them at the PAT appointment. Patient was also instructed that they will need to review over the PAT instructions again at home before surgery.

## 2024-02-25 ENCOUNTER — Other Ambulatory Visit: Payer: Self-pay

## 2024-02-25 ENCOUNTER — Encounter (HOSPITAL_COMMUNITY): Payer: Self-pay

## 2024-02-25 ENCOUNTER — Encounter (HOSPITAL_COMMUNITY)
Admission: RE | Admit: 2024-02-25 | Discharge: 2024-02-25 | Disposition: A | Discharge status: Home / Self Care | Source: Ambulatory Visit | Attending: Orthopedic Surgery | Admitting: Orthopedic Surgery

## 2024-02-25 VITALS — BP 168/85 | HR 86 | Temp 98.2°F | Resp 16 | Ht 65.0 in | Wt 208.2 lb

## 2024-02-25 DIAGNOSIS — Z87891 Personal history of nicotine dependence: Secondary | ICD-10-CM | POA: Diagnosis not present

## 2024-02-25 DIAGNOSIS — I10 Essential (primary) hypertension: Secondary | ICD-10-CM | POA: Diagnosis not present

## 2024-02-25 DIAGNOSIS — J45909 Unspecified asthma, uncomplicated: Secondary | ICD-10-CM | POA: Insufficient documentation

## 2024-02-25 DIAGNOSIS — M3500 Sicca syndrome, unspecified: Secondary | ICD-10-CM | POA: Insufficient documentation

## 2024-02-25 DIAGNOSIS — M1711 Unilateral primary osteoarthritis, right knee: Secondary | ICD-10-CM | POA: Diagnosis not present

## 2024-02-25 DIAGNOSIS — Z01818 Encounter for other preprocedural examination: Secondary | ICD-10-CM | POA: Diagnosis present

## 2024-02-25 DIAGNOSIS — Z01812 Encounter for preprocedural laboratory examination: Secondary | ICD-10-CM | POA: Insufficient documentation

## 2024-02-25 HISTORY — DX: Anemia, unspecified: D64.9

## 2024-02-25 LAB — CBC
HCT: 38.7 % (ref 36.0–46.0)
Hemoglobin: 12.2 g/dL (ref 12.0–15.0)
MCH: 28.1 pg (ref 26.0–34.0)
MCHC: 31.5 g/dL (ref 30.0–36.0)
MCV: 89.2 fL (ref 80.0–100.0)
Platelets: 355 K/uL (ref 150–400)
RBC: 4.34 MIL/uL (ref 3.87–5.11)
RDW: 16.9 % — ABNORMAL HIGH (ref 11.5–15.5)
WBC: 5.3 K/uL (ref 4.0–10.5)
nRBC: 0 % (ref 0.0–0.2)

## 2024-02-25 LAB — BASIC METABOLIC PANEL WITH GFR
Anion gap: 10 (ref 5–15)
BUN: 16 mg/dL (ref 8–23)
CO2: 22 mmol/L (ref 22–32)
Calcium: 9.8 mg/dL (ref 8.9–10.3)
Chloride: 109 mmol/L (ref 98–111)
Creatinine, Ser: 0.57 mg/dL (ref 0.44–1.00)
GFR, Estimated: >60 mL/min (ref >60)
Glucose, Bld: 96 mg/dL (ref 70–99)
Potassium: 3.8 mmol/L (ref 3.5–5.1)
Sodium: 141 mmol/L (ref 135–145)

## 2024-02-25 LAB — SURGICAL PCR SCREEN
MRSA, PCR: NEGATIVE
Staphylococcus aureus: NEGATIVE

## 2024-02-25 NOTE — H&P (Signed)
 TOTAL KNEE ADMISSION H&P  Patient is being admitted for right total knee arthroplasty.  Therapy Plans: HHPT likely then OPPT Disposition: Home with husband (has neighbors who can help) Planned DVT Prophylaxis: aspirin  81mg  BID DME needed: none PCP: Cardio: Dr. Mona - clearance received TXA: IV Allergies: augmentin - rash (tolerates amoxicillin ), NSAIDs- itching, latex - itching, Anesthesia Concerns: none BMI: 34.8 Last HgbA1c: pre-diabetic   Other: - Ice machine at hospital - Tolerates ibuprofen - may try toradol - flexeril  10, oxycodone , tylenol  - no hx of VTE or cancer - hx of left TKA with Dr. Melodi  Will order OT at hospital   had a long discussion about SNF - we do not recommend this and she understands Daughter was not able to take time off of work and husband cannot provide much physical help  Subjective:  Chief Complaint:right knee pain.  HPI: Kathy Duran, 78 y.o. female, has a history of pain and functional disability in the right knee due to arthritis and has failed non-surgical conservative treatments for greater than 12 weeks to includeNSAID's and/or analgesics, corticosteriod injections, and activity modification.  Onset of symptoms was gradual, starting 2 years ago with gradually worsening course since that time. The patient noted no past surgery on the right knee(s).  Patient currently rates pain in the right knee(s) at 8 out of 10 with activity. Patient has worsening of pain with activity and weight bearing, pain that interferes with activities of daily living, and pain with passive range of motion.  Patient has evidence of joint space narrowing by imaging studies. There is no active infection.  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   Primary osteoarthritis of left knee 11/16/2019   Unilateral primary osteoarthritis, left knee 05/25/2019   Trochanteric bursitis, left hip 03/07/2017    Lymphedema 12/06/2016   Bilateral bunions 08/21/2016   Seasonal allergies 07/06/2016   Asthma 07/06/2016   Sjogren's syndrome (HCC) 07/06/2016   Bilateral primary osteoarthritis of knee 07/06/2016   Osteoarthritis of both hands 07/06/2016   Myofascial muscle pain 07/06/2016   DDD lumbar spine with spinal stenosis 07/06/2016   Abnormality of gait 06/26/2016   Chronic pain of right ankle 06/26/2016   H/O bilateral hip replacements 02/24/2016   Varicose veins 04/20/2015   Lipidemia 04/20/2015   Rheumatoid factor positive 11/20/2013   Back pain with left-sided sciatica 11/20/2013   HTN (hypertension) 11/20/2013   GERD (gastroesophageal reflux disease) 11/20/2013   Bladder spasm 11/20/2013   Hyperlipidemia 11/20/2013   Bilateral swelling of feet    Bruises easily    Past Medical History:  Diagnosis Date   Anemia    Anxiety    Arthritis    left hip   Asthma    Inhalers for tx   Bilateral primary osteoarthritis of knee 07/06/2016   Moderate   Bilateral swelling of feet    bilateral ankle and feet edema   Bruises easily    Complication of anesthesia    sometimes needs oxygen  when waking uo due to asthma, pain block caused paralysis   DDD (degenerative disc disease), lumbar 07/06/2016   Fibromyalgia    GERD (gastroesophageal reflux disease)    Hypertension    Myofacial muscle pain 07/06/2016   Osteoarthritis of both hands 07/06/2016   Pre-diabetes    Seasonal allergies 07/06/2016   Sinus complaint    Sjogren's syndrome (HCC)    dry eyes   Tenonitis    Urinary  incontinence    occ. an issue wears pads    Past Surgical History:  Procedure Laterality Date   ABDOMINAL HYSTERECTOMY  1986   BACK SURGERY  1985   BREAST CYST ASPIRATION     BREAST SURGERY Right    cyst drainage   CARPAL TUNNEL RELEASE Right 2001   KNEE SURGERY  1991   REVERSE SHOULDER ARTHROPLASTY Left 03/01/2022   Procedure: REVERSE SHOULDER ARTHROPLASTY;  Surgeon: Melita Drivers, MD;  Location: [facility];   Service: Orthopedics;  Laterality: Left;    SHOULDER SURGERY Right    right shoulder tendon and rotator cuff repair   TOTAL HIP ARTHROPLASTY Right 2006   TOTAL HIP ARTHROPLASTY Left 02/24/2016   Procedure: LEFT TOTAL HIP ARTHROPLASTY ANTERIOR APPROACH;  Surgeon: Lonni CINDERELLA Poli, MD;  Location: [facility];  Service: Orthopedics;  Laterality: Left;   TOTAL KNEE ARTHROPLASTY Left 11/16/2019   Procedure: TOTAL KNEE ARTHROPLASTY;  Surgeon: Melodi Lerner, MD;  Location: [facility];  Service: Orthopedics;  Laterality: Left;     No current facility-administered medications for this encounter.   Current Outpatient Medications  Medication Sig Dispense Refill Last Dose/Taking   acetaminophen  (TYLENOL ) 650 MG CR tablet Take 650 mg by mouth 2 (two) times daily.   Taking   albuterol  (PROVENTIL ) (2.5 MG/3ML) 0.083% nebulizer solution Take 2.5 mg by nebulization every 4 (four) hours as needed for shortness of breath.   Taking As Needed   albuterol  (VENTOLIN  HFA) 108 (90 Base) MCG/ACT inhaler Inhale 1 puff into the lungs every 4 (four) hours as needed for wheezing or shortness of breath (Asthma).   Taking As Needed   atorvastatin  (LIPITOR) 20 MG tablet Take 20 mg by mouth daily.   Taking   cetirizine (ZYRTEC) 10 MG tablet Take 10 mg by mouth daily as needed for allergies (in th fall).   Taking As Needed   estradiol (VIVELLE-DOT) 0.1 MG/24HR patch Place 1 patch onto the skin 2 (two) times a week. Sundays & Wednesdays.   Taking   fluticasone-salmeterol (WIXELA INHUB) 250-50 MCG/ACT AEPB Inhale 1 puff into the lungs in the morning and at bedtime.   Taking   folic acid (FOLVITE) 1 MG tablet Take 1 mg by mouth daily.   Taking   gabapentin  (NEURONTIN ) 300 MG capsule Take 300 mg by mouth at bedtime as needed (muscle pain/pain).   Taking As Needed   inFLIXimab  (REMICADE ) 100 MG injection Inject 100 mg into the skin every 6 (six) weeks.   Taking   losartan  (COZAAR ) 100 MG tablet Take 50 mg by mouth daily.    Taking   methotrexate (RHEUMATREX) 2.5 MG tablet Take 20 mg by mouth once a week. Takes 8 tabs weekly   Taking   mometasone  (NASONEX ) 50 MCG/ACT nasal spray Place 2 sprays into the nose daily as needed (Congestion).   Taking As Needed   Multiple Vitamin (MULTIVITAMIN WITH MINERALS) TABS tablet Take 1 tablet by mouth daily.   Taking   Multiple Vitamins-Minerals (HAIR SKIN NAILS PO) Take 1 tablet by mouth daily.   Taking   ondansetron  (ZOFRAN ) 4 MG tablet Take 1 tablet (4 mg total) by mouth every 8 (eight) hours as needed for nausea or vomiting. 10 tablet 0 Taking As Needed   pantoprazole  (PROTONIX ) 40 MG tablet Take 1 tablet (40 mg total) by mouth daily. 30 tablet 2 Taking   RESTASIS  0.05 % ophthalmic emulsion Place 1 drop into both eyes daily as needed (Dry eyes).   Taking As Needed  YUVAFEM 10 MCG TABS vaginal tablet Place 10 mcg vaginally 2 (two) times a week. Tuesdays & Thursdays.   Taking   zolpidem  (AMBIEN ) 10 MG tablet Take 10 mg by mouth at bedtime as needed for sleep.   Taking As Needed   Allergies  Allergen Reactions   Clavulanic Acid    Escitalopram Itching   Losartan  Potassium-Hctz Other (See Comments)    HEADACHES   Sulfur Dioxide Other (See Comments)    Other Reaction(s): Not available   Augmentin  [Amoxicillin -Pot Clavulanate] Rash   Diclofenac-Misoprostol Itching    Arthrotec   Latex Itching   Naproxen Nausea Only   Sulfa Antibiotics Rash    Social History   Tobacco Use   Smoking status: Former    Current packs/day: 0.00    Average packs/day: 0.1 packs/day for 15.0 years (1.5 ttl pk-yrs)    Types: Cigarettes    Start date: 11/20/1968    Quit date: 11/21/1983    Years since quitting: 40.2   Smokeless tobacco: Never  Substance Use Topics   Alcohol use: No    Family History  Problem Relation Age of Onset   Arrhythmia Mother    Stroke Mother    Hyperlipidemia Mother    Diabetes Mother    Hypertension Mother    Heart attack Father    Diabetes Father    Cancer  Father    Hypertension Father    Hyperlipidemia Maternal Grandmother    Hypertension Maternal Grandmother    Heart attack Paternal Grandfather    Stroke Paternal Grandfather    Lung disease Brother    Diabetes Sister    Dementia Sister    Allergies Daughter    Healthy Daughter      Review of Systems  Constitutional:  Negative for chills and fever.  Respiratory:  Negative for cough and shortness of breath.   Cardiovascular:  Negative for chest pain.  Gastrointestinal:  Negative for nausea and vomiting.  Musculoskeletal:  Positive for arthralgias.     Objective:  Physical Exam Well nourished and well developed. General: Alert and oriented x3, cooperative and pleasant, no acute distress.  Musculoskeletal: Right knee exam: No palpable effusion, warmth erythema Slight flexion contracture with flexion in the seated position over 100 degrees Tenderness over the medial and anterior aspect the knee Stable medial and lateral collateral ligaments  Left knee exam: Healed anterior incision without warmth or erythema Slight flexion contracture with flexion to 100 degrees with terminal tightness in the seated position No significant lower extremity edema, erythema or calf tenderness  Vital signs in last 24 hours: Temp:  [98.2 F (36.8 C)] 98.2 F (36.8 C) (08/05 1335) Pulse Rate:  [86] 86 (08/05 1335) Resp:  [16] 16 (08/05 1335) BP: (171)/(82) 171/82 (08/05 1335) SpO2:  [99 %] 99 % (08/05 1335) Weight:  [94.4 kg] 94.4 kg (08/05 1335)  Labs:   Estimated body mass index is 34.65 kg/m as calculated from the following:   Height as of 02/25/24: 5' 5 (1.651 m).   Weight as of 02/25/24: 94.4 kg.   Imaging Review Plain radiographs demonstrate severe degenerative joint disease of the right knee(s). The overall alignment isneutral. The bone quality appears to be adequate for age and reported activity level.      Assessment/Plan:  End stage arthritis, right knee   The patient  history, physical examination, clinical judgment of the provider and imaging studies are consistent with end stage degenerative joint disease of the right knee(s) and total knee arthroplasty is deemed  medically necessary. The treatment options including medical management, injection therapy arthroscopy and arthroplasty were discussed at length. The risks and benefits of total knee arthroplasty were presented and reviewed. The risks due to aseptic loosening, infection, stiffness, patella tracking problems, thromboembolic complications and other imponderables were discussed. The patient acknowledged the explanation, agreed to proceed with the plan and consent was signed. Patient is being admitted for inpatient treatment for surgery, pain control, PT, OT, prophylactic antibiotics, VTE prophylaxis, progressive ambulation and ADL's and discharge planning. The patient is planning to be discharged home.     Patient's anticipated LOS is less than 2 midnights, meeting these requirements: - Younger than 23 - Lives within 1 hour of care - Has a competent adult at home to recover with post-op recover - NO history of  - Chronic pain requiring opiods  - Diabetes  - Coronary Artery Disease  - Heart failure  - Heart attack  - Stroke  - DVT/VTE  - Cardiac arrhythmia  - Respiratory Failure/COPD  - Renal failure  - Anemia  - Advanced Liver disease  Rosina Calin, PA-C Orthopedic Surgery [facility] [place] [phone]

## 2024-02-26 NOTE — Progress Notes (Signed)
 Anesthesia Chart Review   Case: [id number] Date/Time: 03/03/24 0935   Procedure: ARTHROPLASTY, KNEE, TOTAL (Right: Knee)   Anesthesia type: Spinal   Diagnosis: Primary osteoarthritis of right knee [M17.11]   Pre-op diagnosis: Right knee osteoarthritis   Location: [facility] ROOM 09 / [facility]   Surgeons: Ernie Cough, MD       DISCUSSION:78 y.o. former smoker with h/o HTN, asthma, Sjogren's, right knee OA scheduled for above procedure 03/03/2024 with Dr. Cough Ernie.   Pt follows with cardiology.  H/o chronic bilateral lower extremity edema, diastolic dysfunction, atypical chest pain. Last seen 02/11/24. Per notes euvolemic, well compensated, no anginal symptoms, BP well controlled.   Echocardiogram in 2022 showed EF 60 to 65%, moderate septal hypertrophy, mild concentric LVH, G1 DD   Lexiscan  Myoview  in 2022 was low risk, no evidence of ischemia or infarction. Coronary calcium  score in 04/2021 was 0   Per cardiology preoperative evaluation 02/11/24, According to the Revised Cardiac Risk Index (RCRI), her Perioperative Risk of Major Cardiac Event is (%): 0.4. Her Functional Capacity in METs is: 3.97 according to the Duke Activity Status Index (DASI). She is unable to complete greater than 4 METS in the setting of right knee pain.  She denies any new cardiac symptoms. Cardiac workup to date overall reassuring. Reviewed with Dr. Mona, primary cardiologist, who agrees patient would be at acceptable risk for the planned procedure without further cardiovascular testing. I will route this recommendation to the requesting party via Epic fax function.  VS: BP (!) 168/85   Pulse 86   Temp 36.8 C (Oral)   Resp 16   Ht 5' 5 (1.651 m)   Wt 94.4 kg   SpO2 99%   BMI 34.65 kg/m   PROVIDERS: Ransom Other, MD is PCP   Mona Kent, MD is Cardiologist  LABS: Labs reviewed: Acceptable for surgery. (all labs ordered are listed, but only abnormal results are displayed)  Labs Reviewed  CBC -  Abnormal; Notable for the following components:      Result Value   RDW 16.9 (*)    All other components within normal limits  SURGICAL PCR SCREEN  BASIC METABOLIC PANEL WITH GFR     IMAGES:   EKG:   CV: Echo 10/04/2020 1. Moderate septal hypertrophy with otherwise mild concentric LVH. Left  ventricular ejection fraction, by estimation, is 60 to 65%. The left  ventricle has normal function. The left ventricle has no regional wall  motion abnormalities. There is moderate  asymmetric left ventricular hypertrophy. Left ventricular diastolic  parameters are consistent with Grade I diastolic dysfunction (impaired  relaxation).   2. Right ventricular systolic function is normal. The right ventricular  size is normal. There is mildly elevated pulmonary artery systolic  pressure.   3. The mitral valve is normal in structure. No evidence of mitral valve  regurgitation. No evidence of mitral stenosis.   4. The aortic valve is tricuspid. Aortic valve regurgitation is mild. No  aortic stenosis is present. Aortic regurgitation PHT measures 344 msec.   5. The inferior vena cava is dilated in size with <50% respiratory  variability, suggesting right atrial pressure of 15 mmHg.   Myocardial Perfusion 09/27/2020  The left ventricular ejection fraction is normal (55-65%). Nuclear stress EF: 65%. There was no ST segment deviation noted during stress. The study is normal. This is a low risk study.   Normal stress nuclear study with no ischemia or infarction.  Gated ejection fraction 65% with normal wall motion. Past  Medical History:  Diagnosis Date   Anemia    Anxiety    Arthritis    left hip   Asthma    Inhalers for tx   Bilateral primary osteoarthritis of knee 07/06/2016   Moderate   Bilateral swelling of feet    bilateral ankle and feet edema   Bruises easily    Complication of anesthesia    sometimes needs oxygen  when waking uo due to asthma, pain block caused paralysis   DDD  (degenerative disc disease), lumbar 07/06/2016   Fibromyalgia    GERD (gastroesophageal reflux disease)    Hypertension    Myofacial muscle pain 07/06/2016   Osteoarthritis of both hands 07/06/2016   Pre-diabetes    Seasonal allergies 07/06/2016   Sinus complaint    Sjogren's syndrome (HCC)    dry eyes   Tenonitis    Urinary incontinence    occ. an issue wears pads    Past Surgical History:  Procedure Laterality Date   ABDOMINAL HYSTERECTOMY  1986   BACK SURGERY  1985   BREAST CYST ASPIRATION     BREAST SURGERY Right    cyst drainage   CARPAL TUNNEL RELEASE Right 2001   KNEE SURGERY  1991   REVERSE SHOULDER ARTHROPLASTY Left 03/01/2022   Procedure: REVERSE SHOULDER ARTHROPLASTY;  Surgeon: Melita Drivers, MD;  Location: [facility];  Service: Orthopedics;  Laterality: Left;    SHOULDER SURGERY Right    right shoulder tendon and rotator cuff repair   TOTAL HIP ARTHROPLASTY Right 2006   TOTAL HIP ARTHROPLASTY Left 02/24/2016   Procedure: LEFT TOTAL HIP ARTHROPLASTY ANTERIOR APPROACH;  Surgeon: Lonni CINDERELLA Poli, MD;  Location: [facility];  Service: Orthopedics;  Laterality: Left;   TOTAL KNEE ARTHROPLASTY Left 11/16/2019   Procedure: TOTAL KNEE ARTHROPLASTY;  Surgeon: Melodi Lerner, MD;  Location: [facility];  Service: Orthopedics;  Laterality: Left;     MEDICATIONS:  acetaminophen  (TYLENOL ) 650 MG CR tablet   albuterol  (PROVENTIL ) (2.5 MG/3ML) 0.083% nebulizer solution   albuterol  (VENTOLIN  HFA) 108 (90 Base) MCG/ACT inhaler   atorvastatin  (LIPITOR) 20 MG tablet   cetirizine (ZYRTEC) 10 MG tablet   estradiol (VIVELLE-DOT) 0.1 MG/24HR patch   fluticasone-salmeterol (WIXELA INHUB) 250-50 MCG/ACT AEPB   folic acid (FOLVITE) 1 MG tablet   gabapentin  (NEURONTIN ) 300 MG capsule   inFLIXimab  (REMICADE ) 100 MG injection   losartan  (COZAAR ) 100 MG tablet   methotrexate (RHEUMATREX) 2.5 MG tablet   mometasone  (NASONEX ) 50 MCG/ACT nasal spray   Multiple Vitamin (MULTIVITAMIN  WITH MINERALS) TABS tablet   Multiple Vitamins-Minerals (HAIR SKIN NAILS PO)   ondansetron  (ZOFRAN ) 4 MG tablet   pantoprazole  (PROTONIX ) 40 MG tablet   RESTASIS  0.05 % ophthalmic emulsion   YUVAFEM 10 MCG TABS vaginal tablet   zolpidem  (AMBIEN ) 10 MG tablet   No current facility-administered medications for this encounter.     Harlene Hoots Ward, PA-C [facility] [phone]

## 2024-02-26 NOTE — Anesthesia Preprocedure Evaluation (Addendum)
 Anesthesia Evaluation  Patient identified by MRN, date of birth, ID band Patient awake    Reviewed: Allergy & Precautions, NPO status , Patient's Chart, lab work & pertinent test results  Airway Mallampati: II  TM Distance: >3 FB Neck ROM: Full    Dental  (+) Dental Advisory Given   Pulmonary asthma , former smoker   breath sounds clear to auscultation       Cardiovascular hypertension, Pt. on medications  Rhythm:Regular Rate:Normal     Neuro/Psych  Neuromuscular disease    GI/Hepatic Neg liver ROS,GERD  ,,  Endo/Other  negative endocrine ROS    Renal/GU negative Renal ROS     Musculoskeletal  (+) Arthritis ,  Fibromyalgia -  Abdominal   Peds  Hematology  (+) Blood dyscrasia, anemia   Anesthesia Other Findings   Reproductive/Obstetrics                              Anesthesia Physical Anesthesia Plan  ASA: 3  Anesthesia Plan: Spinal   Post-op Pain Management: Ofirmev  IV (intra-op)*   Induction:   PONV Risk Score and Plan: 2 and Propofol  infusion, Dexamethasone , Ondansetron  and Treatment may vary due to age or medical condition  Airway Management Planned: Natural Airway and Simple Face Mask  Additional Equipment:   Intra-op Plan:   Post-operative Plan:   Informed Consent: I have reviewed the patients History and Physical, chart, labs and discussed the procedure including the risks, benefits and alternatives for the proposed anesthesia with the patient or authorized representative who has indicated his/her understanding and acceptance.     Dental advisory given  Plan Discussed with: CRNA  Anesthesia Plan Comments:          Anesthesia Quick Evaluation

## 2024-03-03 ENCOUNTER — Encounter (HOSPITAL_COMMUNITY)
Admission: RE | Disposition: A | Discharge status: Home / Self Care | Payer: Self-pay | Source: Home / Self Care | Attending: Orthopedic Surgery

## 2024-03-03 ENCOUNTER — Observation Stay (HOSPITAL_COMMUNITY)
Admission: RE | Admit: 2024-03-03 | Discharge: 2024-03-05 | Disposition: A | Discharge status: Home / Self Care | Attending: Orthopedic Surgery | Admitting: Orthopedic Surgery

## 2024-03-03 ENCOUNTER — Other Ambulatory Visit: Payer: Self-pay

## 2024-03-03 ENCOUNTER — Ambulatory Visit (HOSPITAL_COMMUNITY): Payer: Self-pay | Admitting: Medical

## 2024-03-03 ENCOUNTER — Ambulatory Visit (HOSPITAL_COMMUNITY): Admitting: Certified Registered Nurse Anesthetist

## 2024-03-03 ENCOUNTER — Encounter (HOSPITAL_COMMUNITY): Payer: Self-pay | Admitting: Orthopedic Surgery

## 2024-03-03 DIAGNOSIS — Z79899 Other long term (current) drug therapy: Secondary | ICD-10-CM | POA: Diagnosis not present

## 2024-03-03 DIAGNOSIS — Z7982 Long term (current) use of aspirin: Secondary | ICD-10-CM | POA: Diagnosis not present

## 2024-03-03 DIAGNOSIS — Z87891 Personal history of nicotine dependence: Secondary | ICD-10-CM

## 2024-03-03 DIAGNOSIS — J45909 Unspecified asthma, uncomplicated: Secondary | ICD-10-CM

## 2024-03-03 DIAGNOSIS — I10 Essential (primary) hypertension: Secondary | ICD-10-CM | POA: Diagnosis not present

## 2024-03-03 DIAGNOSIS — Z91040 Latex allergy status: Secondary | ICD-10-CM | POA: Diagnosis not present

## 2024-03-03 DIAGNOSIS — Z96643 Presence of artificial hip joint, bilateral: Secondary | ICD-10-CM | POA: Diagnosis not present

## 2024-03-03 DIAGNOSIS — M25561 Pain in right knee: Secondary | ICD-10-CM | POA: Diagnosis present

## 2024-03-03 DIAGNOSIS — G8918 Other acute postprocedural pain: Secondary | ICD-10-CM | POA: Diagnosis not present

## 2024-03-03 DIAGNOSIS — M1711 Unilateral primary osteoarthritis, right knee: Secondary | ICD-10-CM

## 2024-03-03 DIAGNOSIS — Z96651 Presence of right artificial knee joint: Principal | ICD-10-CM

## 2024-03-03 HISTORY — PX: TOTAL KNEE ARTHROPLASTY: SHX125

## 2024-03-03 SURGERY — ARTHROPLASTY, KNEE, TOTAL
Anesthesia: General | Site: Knee | Laterality: Right

## 2024-03-03 MED ORDER — AMISULPRIDE (ANTIEMETIC) 5 MG/2ML IV SOLN: 10.0000 mg | Freq: Once | INTRAVENOUS | Status: DC | PRN

## 2024-03-03 MED ORDER — METOCLOPRAMIDE HCL 5 MG/ML IJ SOLN: 5.0000 mg | Freq: Three times a day (TID) | INTRAMUSCULAR | Status: DC | PRN

## 2024-03-03 MED ORDER — ONDANSETRON HCL 4 MG/2ML IJ SOLN
INTRAMUSCULAR | Status: AC
  Filled 2024-03-03: qty 2

## 2024-03-03 MED ORDER — FENTANYL CITRATE (PF) 100 MCG/2ML IJ SOLN
INTRAMUSCULAR | Status: AC
Start: 2024-03-03 — End: 2024-03-03
  Filled 2024-03-03: qty 2

## 2024-03-03 MED ORDER — ONDANSETRON HCL 4 MG PO TABS: 4.0000 mg | ORAL_TABLET | Freq: Four times a day (QID) | ORAL | Status: DC | PRN

## 2024-03-03 MED ORDER — PROPOFOL 500 MG/50ML IV EMUL
INTRAVENOUS | Status: DC | PRN
Start: 2024-03-03 — End: 2024-03-03
  Administered 2024-03-03 (×2): 75 ug/kg/min via INTRAVENOUS

## 2024-03-03 MED ORDER — FOLIC ACID 1 MG PO TABS
1.0000 mg | ORAL_TABLET | Freq: Every day | ORAL | Status: DC
  Administered 2024-03-04 – 2024-03-05 (×3): 1 mg via ORAL
  Filled 2024-03-03 (×2): qty 1

## 2024-03-03 MED ORDER — MOMETASONE FURO-FORMOTEROL FUM 200-5 MCG/ACT IN AERO
2.0000 | INHALATION_SPRAY | Freq: Two times a day (BID) | RESPIRATORY_TRACT | Status: DC
  Administered 2024-03-04 – 2024-03-05 (×5): 2 via RESPIRATORY_TRACT
  Filled 2024-03-03: qty 8.8

## 2024-03-03 MED ORDER — MIDAZOLAM HCL 2 MG/2ML IJ SOLN
1.0000 mg | Freq: Once | INTRAMUSCULAR | Status: DC
  Filled 2024-03-03: qty 2

## 2024-03-03 MED ORDER — OXYCODONE HCL 5 MG PO TABS
5.0000 mg | ORAL_TABLET | ORAL | Status: DC | PRN
  Administered 2024-03-04 (×2): 10 mg via ORAL
  Filled 2024-03-03: qty 2

## 2024-03-03 MED ORDER — HYDROMORPHONE HCL 1 MG/ML IJ SOLN
INTRAMUSCULAR | Status: AC
Start: 2024-03-03 — End: 2024-03-03
  Filled 2024-03-03: qty 1

## 2024-03-03 MED ORDER — ONDANSETRON HCL 4 MG/2ML IJ SOLN: 4.0000 mg | Freq: Four times a day (QID) | INTRAMUSCULAR | Status: DC | PRN

## 2024-03-03 MED ORDER — FENTANYL CITRATE PF 50 MCG/ML IJ SOSY
PREFILLED_SYRINGE | INTRAMUSCULAR | Status: AC
  Filled 2024-03-03: qty 2

## 2024-03-03 MED ORDER — POVIDONE-IODINE 10 % EX SWAB
2.0000 | Freq: Once | CUTANEOUS | Status: AC
  Administered 2024-03-03 (×2): 2 via TOPICAL

## 2024-03-03 MED ORDER — PANTOPRAZOLE SODIUM 40 MG PO TBEC
40.0000 mg | DELAYED_RELEASE_TABLET | Freq: Every day | ORAL | Status: DC
  Administered 2024-03-04 – 2024-03-05 (×3): 40 mg via ORAL
  Filled 2024-03-03 (×2): qty 1

## 2024-03-03 MED ORDER — CEFAZOLIN SODIUM-DEXTROSE 2-4 GM/100ML-% IV SOLN
2.0000 g | INTRAVENOUS | Status: AC
  Administered 2024-03-03 (×2): 2 g via INTRAVENOUS
  Filled 2024-03-03: qty 100

## 2024-03-03 MED ORDER — ACETAMINOPHEN 10 MG/ML IV SOLN
INTRAVENOUS | Status: AC
Start: 2024-03-03 — End: 2024-03-03
  Filled 2024-03-03: qty 100

## 2024-03-03 MED ORDER — KETOROLAC TROMETHAMINE 15 MG/ML IJ SOLN
7.5000 mg | Freq: Four times a day (QID) | INTRAMUSCULAR | Status: AC
  Administered 2024-03-03 (×4): 7.5 mg via INTRAVENOUS
  Filled 2024-03-03 (×2): qty 1

## 2024-03-03 MED ORDER — HYDROMORPHONE HCL 1 MG/ML IJ SOLN
0.5000 mg | INTRAMUSCULAR | Status: AC | PRN
  Administered 2024-03-03 (×8): 0.5 mg via INTRAVENOUS

## 2024-03-03 MED ORDER — KETOROLAC TROMETHAMINE 30 MG/ML IJ SOLN
INTRAMUSCULAR | Status: DC | PRN
  Administered 2024-03-03 (×2): 61 mL

## 2024-03-03 MED ORDER — LOSARTAN POTASSIUM 50 MG PO TABS
50.0000 mg | ORAL_TABLET | Freq: Every day | ORAL | Status: DC
  Administered 2024-03-04 – 2024-03-05 (×3): 50 mg via ORAL
  Filled 2024-03-03 (×2): qty 1

## 2024-03-03 MED ORDER — TRANEXAMIC ACID-NACL 1000-0.7 MG/100ML-% IV SOLN
1000.0000 mg | INTRAVENOUS | Status: AC
  Administered 2024-03-03 (×2): 1000 mg via INTRAVENOUS
  Filled 2024-03-03: qty 100

## 2024-03-03 MED ORDER — CYCLOBENZAPRINE HCL 10 MG PO TABS
10.0000 mg | ORAL_TABLET | Freq: Three times a day (TID) | ORAL | Status: DC | PRN
  Administered 2024-03-03 – 2024-03-05 (×5): 10 mg via ORAL
  Filled 2024-03-03 (×4): qty 1

## 2024-03-03 MED ORDER — 0.9 % SODIUM CHLORIDE (POUR BTL) OPTIME
TOPICAL | Status: DC | PRN
  Administered 2024-03-03 (×2): 1000 mL

## 2024-03-03 MED ORDER — ORAL CARE MOUTH RINSE: 15.0000 mL | Freq: Once | OROMUCOSAL | Status: AC

## 2024-03-03 MED ORDER — FENTANYL CITRATE (PF) 100 MCG/2ML IJ SOLN
INTRAMUSCULAR | Status: DC | PRN
  Administered 2024-03-03 (×5): 25 ug via INTRAVENOUS
  Administered 2024-03-03 (×2): 50 ug via INTRAVENOUS
  Administered 2024-03-03 (×2): 25 ug via INTRAVENOUS
  Administered 2024-03-03 (×3): 50 ug via INTRAVENOUS
  Administered 2024-03-03: 25 ug via INTRAVENOUS
  Administered 2024-03-03: 50 ug via INTRAVENOUS

## 2024-03-03 MED ORDER — SODIUM CHLORIDE 0.9 % IV SOLN: INTRAVENOUS | Status: DC

## 2024-03-03 MED ORDER — ALUM & MAG HYDROXIDE-SIMETH 200-200-20 MG/5ML PO SUSP: 30.0000 mL | ORAL | Status: DC | PRN

## 2024-03-03 MED ORDER — ROPIVACAINE HCL 5 MG/ML IJ SOLN
INTRAMUSCULAR | Status: DC | PRN
Start: 2024-03-03 — End: 2024-03-03
  Administered 2024-03-03 (×2): 20 mL via PERINEURAL

## 2024-03-03 MED ORDER — ACETAMINOPHEN 10 MG/ML IV SOLN
INTRAVENOUS | Status: DC | PRN
  Administered 2024-03-03 (×2): 1000 mg via INTRAVENOUS

## 2024-03-03 MED ORDER — PHENOL 1.4 % MT LIQD
1.0000 | OROMUCOSAL | Status: DC | PRN
Start: 2024-03-03 — End: 2024-03-05

## 2024-03-03 MED ORDER — CHLORHEXIDINE GLUCONATE 0.12 % MT SOLN
15.0000 mL | Freq: Once | OROMUCOSAL | Status: AC
  Administered 2024-03-03 (×2): 15 mL via OROMUCOSAL

## 2024-03-03 MED ORDER — BISACODYL 10 MG RE SUPP: 10.0000 mg | Freq: Every day | RECTAL | Status: DC | PRN

## 2024-03-03 MED ORDER — OXYCODONE HCL 5 MG PO TABS
5.0000 mg | ORAL_TABLET | Freq: Once | ORAL | Status: AC | PRN
  Administered 2024-03-03 (×2): 5 mg via ORAL

## 2024-03-03 MED ORDER — OXYCODONE HCL 5 MG PO TABS
ORAL_TABLET | ORAL | Status: AC
  Filled 2024-03-03: qty 1

## 2024-03-03 MED ORDER — SODIUM CHLORIDE 0.9 % IR SOLN
Status: DC | PRN
  Administered 2024-03-03 (×2): 1000 mL

## 2024-03-03 MED ORDER — MENTHOL 3 MG MT LOZG: 1.0000 | LOZENGE | OROMUCOSAL | Status: DC | PRN

## 2024-03-03 MED ORDER — PROPOFOL 10 MG/ML IV BOLUS
INTRAVENOUS | Status: DC | PRN
  Administered 2024-03-03: 30 mg via INTRAVENOUS
  Administered 2024-03-03: 20 mg via INTRAVENOUS
  Administered 2024-03-03: 30 mg via INTRAVENOUS
  Administered 2024-03-03: 20 mg via INTRAVENOUS
  Administered 2024-03-03 (×2): 100 mg via INTRAVENOUS

## 2024-03-03 MED ORDER — HYDROMORPHONE HCL 1 MG/ML IJ SOLN
0.5000 mg | INTRAMUSCULAR | Status: DC | PRN
  Administered 2024-03-03 (×2): 1 mg via INTRAVENOUS
  Filled 2024-03-03: qty 1

## 2024-03-03 MED ORDER — ZOLPIDEM TARTRATE 5 MG PO TABS
5.0000 mg | ORAL_TABLET | Freq: Every evening | ORAL | Status: DC | PRN
  Administered 2024-03-04 (×2): 5 mg via ORAL
  Filled 2024-03-03 (×2): qty 1

## 2024-03-03 MED ORDER — LACTATED RINGERS IV SOLN: INTRAVENOUS | Status: DC

## 2024-03-03 MED ORDER — FENTANYL CITRATE (PF) 100 MCG/2ML IJ SOLN
INTRAMUSCULAR | Status: AC
  Filled 2024-03-03: qty 2

## 2024-03-03 MED ORDER — PHENYLEPHRINE 80 MCG/ML (10ML) SYRINGE FOR IV PUSH (FOR BLOOD PRESSURE SUPPORT)
PREFILLED_SYRINGE | INTRAVENOUS | Status: AC
  Filled 2024-03-03: qty 10

## 2024-03-03 MED ORDER — DEXAMETHASONE SODIUM PHOSPHATE 10 MG/ML IJ SOLN
10.0000 mg | Freq: Once | INTRAMUSCULAR | Status: AC
  Administered 2024-03-04 (×2): 10 mg via INTRAVENOUS
  Filled 2024-03-03: qty 1

## 2024-03-03 MED ORDER — DIPHENHYDRAMINE HCL 12.5 MG/5ML PO ELIX
12.5000 mg | ORAL_SOLUTION | ORAL | Status: DC | PRN
  Administered 2024-03-03 (×2): 25 mg via ORAL
  Filled 2024-03-03: qty 10

## 2024-03-03 MED ORDER — FENTANYL CITRATE PF 50 MCG/ML IJ SOSY
50.0000 ug | PREFILLED_SYRINGE | Freq: Once | INTRAMUSCULAR | Status: AC
  Administered 2024-03-03 (×2): 50 ug via INTRAVENOUS
  Filled 2024-03-03: qty 2

## 2024-03-03 MED ORDER — PROPOFOL 1000 MG/100ML IV EMUL
INTRAVENOUS | Status: AC
  Filled 2024-03-03: qty 100

## 2024-03-03 MED ORDER — METOCLOPRAMIDE HCL 5 MG PO TABS: 5.0000 mg | ORAL_TABLET | Freq: Three times a day (TID) | ORAL | Status: DC | PRN

## 2024-03-03 MED ORDER — POLYETHYLENE GLYCOL 3350 17 G PO PACK
17.0000 g | PACK | Freq: Two times a day (BID) | ORAL | Status: DC
  Administered 2024-03-03 – 2024-03-05 (×7): 17 g via ORAL
  Filled 2024-03-03 (×4): qty 1

## 2024-03-03 MED ORDER — ATORVASTATIN CALCIUM 20 MG PO TABS
20.0000 mg | ORAL_TABLET | Freq: Every day | ORAL | Status: DC
  Administered 2024-03-04 – 2024-03-05 (×3): 20 mg via ORAL
  Filled 2024-03-03 (×2): qty 1

## 2024-03-03 MED ORDER — DEXAMETHASONE SODIUM PHOSPHATE 10 MG/ML IJ SOLN
8.0000 mg | Freq: Once | INTRAMUSCULAR | Status: AC
  Administered 2024-03-03 (×2): 8 mg via INTRAVENOUS

## 2024-03-03 MED ORDER — ACETAMINOPHEN 500 MG PO TABS
1000.0000 mg | ORAL_TABLET | Freq: Four times a day (QID) | ORAL | Status: DC
  Administered 2024-03-03 – 2024-03-05 (×12): 1000 mg via ORAL
  Filled 2024-03-03 (×7): qty 2

## 2024-03-03 MED ORDER — TRANEXAMIC ACID-NACL 1000-0.7 MG/100ML-% IV SOLN
1000.0000 mg | Freq: Once | INTRAVENOUS | Status: AC
  Administered 2024-03-03 (×2): 1000 mg via INTRAVENOUS
  Filled 2024-03-03: qty 100

## 2024-03-03 MED ORDER — METHOTREXATE SODIUM 2.5 MG PO TABS: 20.0000 mg | ORAL_TABLET | ORAL | Status: DC

## 2024-03-03 MED ORDER — GABAPENTIN 300 MG PO CAPS
300.0000 mg | ORAL_CAPSULE | Freq: Every evening | ORAL | Status: DC | PRN
  Administered 2024-03-04 (×4): 300 mg via ORAL
  Filled 2024-03-03 (×2): qty 1

## 2024-03-03 MED ORDER — CLONIDINE HCL (ANALGESIA) 100 MCG/ML EP SOLN
EPIDURAL | Status: DC | PRN
Start: 2024-03-03 — End: 2024-03-03
  Administered 2024-03-03 (×2): 50 ug

## 2024-03-03 MED ORDER — PHENYLEPHRINE 80 MCG/ML (10ML) SYRINGE FOR IV PUSH (FOR BLOOD PRESSURE SUPPORT)
PREFILLED_SYRINGE | INTRAVENOUS | Status: DC | PRN
  Administered 2024-03-03 (×4): 120 ug via INTRAVENOUS

## 2024-03-03 MED ORDER — DEXAMETHASONE SODIUM PHOSPHATE 10 MG/ML IJ SOLN
INTRAMUSCULAR | Status: AC
Start: 2024-03-03 — End: 2024-03-03
  Filled 2024-03-03: qty 1

## 2024-03-03 MED ORDER — OXYCODONE HCL 5 MG PO TABS
10.0000 mg | ORAL_TABLET | ORAL | Status: DC | PRN
  Filled 2024-03-03: qty 2

## 2024-03-03 MED ORDER — CEFAZOLIN SODIUM-DEXTROSE 2-4 GM/100ML-% IV SOLN
2.0000 g | Freq: Four times a day (QID) | INTRAVENOUS | Status: AC
  Administered 2024-03-03 (×4): 2 g via INTRAVENOUS
  Filled 2024-03-03 (×2): qty 100

## 2024-03-03 MED ORDER — ONDANSETRON HCL 4 MG/2ML IJ SOLN
INTRAMUSCULAR | Status: DC | PRN
  Administered 2024-03-03 (×2): 4 mg via INTRAVENOUS

## 2024-03-03 MED ORDER — FENTANYL CITRATE PF 50 MCG/ML IJ SOSY
PREFILLED_SYRINGE | INTRAMUSCULAR | Status: AC
  Filled 2024-03-03: qty 1

## 2024-03-03 MED ORDER — FENTANYL CITRATE PF 50 MCG/ML IJ SOSY
25.0000 ug | PREFILLED_SYRINGE | INTRAMUSCULAR | Status: DC | PRN
  Administered 2024-03-03 (×6): 50 ug via INTRAVENOUS

## 2024-03-03 MED ORDER — SENNA 8.6 MG PO TABS
2.0000 | ORAL_TABLET | Freq: Every day | ORAL | Status: DC
  Administered 2024-03-03 – 2024-03-04 (×4): 17.2 mg via ORAL
  Filled 2024-03-03 (×2): qty 2

## 2024-03-03 MED ORDER — OXYCODONE HCL 5 MG/5ML PO SOLN: 5.0000 mg | Freq: Once | ORAL | Status: AC | PRN

## 2024-03-03 MED ORDER — ASPIRIN 81 MG PO CHEW
81.0000 mg | CHEWABLE_TABLET | Freq: Two times a day (BID) | ORAL | Status: DC
  Administered 2024-03-03 – 2024-03-05 (×7): 81 mg via ORAL
  Filled 2024-03-03 (×5): qty 1

## 2024-03-03 SURGICAL SUPPLY — 44 items
ATTUNE MED ANAT PAT 32 KNEE (Knees) IMPLANT
BAG COUNTER SPONGE SURGICOUNT (BAG) IMPLANT
BAG ZIPLOCK 12X15 (MISCELLANEOUS) ×1 IMPLANT
BASEPLATE TIB CMT FB PCKT SZ4 (Stem) IMPLANT
BLADE SAW SGTL 11.0X1.19X90.0M (BLADE) IMPLANT
BLADE SAW SGTL 13.0X1.19X90.0M (BLADE) ×1 IMPLANT
BNDG ELASTIC 6INX 5YD STR LF (GAUZE/BANDAGES/DRESSINGS) ×1 IMPLANT
BOWL SMART MIX CTS (DISPOSABLE) ×1 IMPLANT
CEMENT HV SMART SET (Cement) ×2 IMPLANT
COMPONENT FEM CMT ATTN NRW 4RT (Joint) IMPLANT
COVER SURGICAL LIGHT HANDLE (MISCELLANEOUS) ×1 IMPLANT
CUFF TRNQT CYL 34X4.125X (TOURNIQUET CUFF) ×1 IMPLANT
DERMABOND ADVANCED .7 DNX12 (GAUZE/BANDAGES/DRESSINGS) ×1 IMPLANT
DRAPE U-SHAPE 47X51 STRL (DRAPES) ×1 IMPLANT
DRESSING AQUACEL AG SP 3.5X10 (GAUZE/BANDAGES/DRESSINGS) ×1 IMPLANT
DURAPREP 26ML APPLICATOR (WOUND CARE) ×2 IMPLANT
ELECT REM PT RETURN 15FT ADLT (MISCELLANEOUS) ×1 IMPLANT
GLOVE BIO SURGEON STRL SZ 6 (GLOVE) ×1 IMPLANT
GLOVE BIOGEL PI IND STRL 6.5 (GLOVE) ×1 IMPLANT
GLOVE BIOGEL PI IND STRL 7.5 (GLOVE) ×1 IMPLANT
GLOVE ORTHO TXT STRL SZ7.5 (GLOVE) ×2 IMPLANT
GOWN STRL REUS W/ TWL LRG LVL3 (GOWN DISPOSABLE) ×2 IMPLANT
INSERT TIB MED ATTUNE 4 7 RT (Insert) IMPLANT
KIT TURNOVER KIT A (KITS) ×1 IMPLANT
MANIFOLD NEPTUNE II (INSTRUMENTS) ×1 IMPLANT
NDL SAFETY ECLIPSE 18X1.5 (NEEDLE) IMPLANT
NS IRRIG 1000ML POUR BTL (IV SOLUTION) ×1 IMPLANT
PACK TOTAL KNEE CUSTOM (KITS) ×2 IMPLANT
PENCIL SMOKE EVACUATOR (MISCELLANEOUS) ×1 IMPLANT
PIN FIX SIGMA LCS THRD HI (PIN) IMPLANT
PROTECTOR NERVE ULNAR (MISCELLANEOUS) ×1 IMPLANT
SET HNDPC FAN SPRY TIP SCT (DISPOSABLE) ×1 IMPLANT
SET PAD KNEE POSITIONER (MISCELLANEOUS) ×1 IMPLANT
SPIKE FLUID TRANSFER (MISCELLANEOUS) ×2 IMPLANT
SUT MNCRL AB 4-0 PS2 18 (SUTURE) ×1 IMPLANT
SUT STRATAFIX PDS+ 0 24IN (SUTURE) ×1 IMPLANT
SUT VIC AB 1 CT1 36 (SUTURE) ×1 IMPLANT
SUT VIC AB 2-0 CT1 TAPERPNT 27 (SUTURE) ×2 IMPLANT
SYR 3ML LL SCALE MARK (SYRINGE) ×1 IMPLANT
TOWEL GREEN STERILE FF (TOWEL DISPOSABLE) ×1 IMPLANT
TRAY FOLEY MTR SLVR 16FR STAT (SET/KITS/TRAYS/PACK) ×1 IMPLANT
TUBE SUCTION HIGH CAP CLEAR NV (SUCTIONS) ×1 IMPLANT
WATER STERILE IRR 1000ML POUR (IV SOLUTION) ×2 IMPLANT
WRAP KNEE MAXI GEL POST OP (GAUZE/BANDAGES/DRESSINGS) ×1 IMPLANT

## 2024-03-03 NOTE — Anesthesia Procedure Notes (Signed)
 Procedure Name: LMA Insertion Date/Time: 03/03/2024 9:49 AM  Performed by: Judythe Tanda Aran, CRNAPre-anesthesia Checklist: Emergency Drugs available, Patient identified, Suction available and Patient being monitored Patient Re-evaluated:Patient Re-evaluated prior to induction Oxygen  Delivery Method: Circle system utilized Preoxygenation: Pre-oxygenation with 100% oxygen  Induction Type: IV induction Ventilation: Mask ventilation without difficulty LMA: LMA inserted LMA Size: 4.0 Number of attempts: 1 Placement Confirmation: positive ETCO2 and breath sounds checked- equal and bilateral Tube secured with: Tape Dental Injury: Teeth and Oropharynx as per pre-operative assessment

## 2024-03-03 NOTE — Discharge Instructions (Signed)

## 2024-03-03 NOTE — Anesthesia Postprocedure Evaluation (Signed)
 Anesthesia Post Note  Patient: Kathy Duran  Procedure(s) Performed: ARTHROPLASTY, KNEE, TOTAL (Right: Knee)     Patient location during evaluation: PACU Anesthesia Type: General Level of consciousness: awake and alert Pain management: pain level controlled Vital Signs Assessment: post-procedure vital signs reviewed and stable Respiratory status: spontaneous breathing, nonlabored ventilation, respiratory function stable and patient connected to nasal cannula oxygen  Cardiovascular status: blood pressure returned to baseline and stable Postop Assessment: no apparent nausea or vomiting Anesthetic complications: no   No notable events documented.  Last Vitals:  Vitals:   03/03/24 1347 03/03/24 1359  BP:  133/68  Pulse: 80 76  Resp: 13 16  Temp:  36.9 C  SpO2: 97% 99%    Last Pain:  Vitals:   03/03/24 1606  TempSrc:   PainSc: 5                  Epifanio Lamar BRAVO

## 2024-03-03 NOTE — Transfer of Care (Signed)
 Immediate Anesthesia Transfer of Care Note  Patient: Kathy Duran  Procedure(s) Performed: ARTHROPLASTY, KNEE, TOTAL (Right: Knee)  Patient Location: PACU  Anesthesia Type:General  Level of Consciousness: awake and patient cooperative  Airway & Oxygen  Therapy: Patient Spontanous Breathing and Patient connected to nasal cannula oxygen   Post-op Assessment: Report given to RN and Post -op Vital signs reviewed and stable  Post vital signs: Reviewed and stable  Last Vitals:  Vitals Value Taken Time  BP 147/116 03/03/24 11:23  Temp    Pulse 82 03/03/24 11:25  Resp 12 03/03/24 11:25  SpO2 97 % 03/03/24 11:25  Vitals shown include unfiled device data.  Last Pain:  Vitals:   03/03/24 0853  TempSrc:   PainSc: 0-No pain         Complications: No notable events documented.

## 2024-03-03 NOTE — Interval H&P Note (Signed)
 History and Physical Interval Note:  03/03/2024 8:16 AM  Kathy Duran  has presented today for surgery, with the diagnosis of Right knee osteoarthritis.  The various methods of treatment have been discussed with the patient and family. After consideration of risks, benefits and other options for treatment, the patient has consented to  Procedure(s): ARTHROPLASTY, KNEE, TOTAL (Right) as a surgical intervention.  The patient's history has been reviewed, patient examined, no change in status, stable for surgery.  I have reviewed the patient's chart and labs.  Questions were answered to the patient's satisfaction.     Kathy Duran

## 2024-03-03 NOTE — Anesthesia Procedure Notes (Signed)
 Anesthesia Regional Block: Adductor canal block   Pre-Anesthetic Checklist: , timeout performed,  Correct Patient, Correct Site, Correct Laterality,  Correct Procedure, Correct Position, site marked,  Risks and benefits discussed,  Surgical consent,  Pre-op evaluation,  At surgeon's request and post-op pain management  Laterality: Right  Prep: chloraprep       Needles:  Injection technique: Single-shot  Needle Type: Echogenic Needle     Needle Length: 9cm  Needle Gauge: 21     Additional Needles:   Procedures:,,,, ultrasound used (permanent image in chart),,    Narrative:  Start time: 03/03/2024 8:43 AM End time: 03/03/2024 8:48 AM Injection made incrementally with aspirations every 5 mL.  Performed by: Personally  Anesthesiologist: Epifanio Charleston, MD

## 2024-03-03 NOTE — Op Note (Signed)
 NAME:  Kathy Duran                      MEDICAL RECORD NO.:  [id number]                             FACILITY:  [facility]      PHYSICIAN:  Donnice BIRCH. Ernie, M.D.  DATE OF BIRTH:  October 24, 1945      DATE OF PROCEDURE:  03/03/2024                                     OPERATIVE REPORT         PREOPERATIVE DIAGNOSIS:  Right knee osteoarthritis.      POSTOPERATIVE DIAGNOSIS:  Right knee osteoarthritis.      FINDINGS:  The patient was noted to have complete loss of cartilage and   bone-on-bone arthritis with associated osteophytes in the medial and patellofemoral compartments of   the knee with a significant synovitis and associated effusion.  The patient had failed months of conservative treatment including medications, injection therapy, activity modification.     PROCEDURE:  Right total knee replacement.      COMPONENTS USED:  DePuy Attune FB CR MS knee   system, a size 4N femur, 4 tibia, size 7 mm CR MS AOX insert, and 32 anatomic patellar   button.      SURGEON:  Donnice BIRCH. Ernie, M.D.      ASSISTANT:  Randine Shuford, PA-C.      ANESTHESIA:  Regional and Spinal.      SPECIMENS:  None.      COMPLICATION:  None.      DRAINS:  None.  EBL: <200 cc      TOURNIQUET TIME:  27 min at 225 mmHg     The patient was stable to the recovery room.      INDICATION FOR PROCEDURE:  Kathy Duran is a 78 y.o. female patient of   mine.  The patient had been seen, evaluated, and treated for months conservatively in the   office with medication, activity modification, and injections.  The patient had   radiographic changes of bone-on-bone arthritis with endplate sclerosis and osteophytes noted.  Based on the radiographic changes and failed conservative measures, the patient   decided to proceed with definitive treatment, total knee replacement.  Risks of infection, DVT, component failure, need for revision surgery, neurovascular injury were reviewed in the office setting.  The postop course was  reviewed stressing the efforts to maximize post-operative satisfaction and function.  Consent was obtained for benefit of pain   relief.      PROCEDURE IN DETAIL:  The patient was brought to the operative theater.   Once adequate anesthesia, preoperative antibiotics, 2 gm of Ancef ,1 gm of Tranexamic Acid , and 10 mg of Decadron  administered, the patient was positioned supine with a right thigh tourniquet placed.  The  right lower extremity was prepped and draped in sterile fashion.  A time-   out was performed identifying the patient, planned procedure, and the appropriate extremity.      The right lower extremity was placed in the [facility] leg holder.  The leg was   exsanguinated, tourniquet elevated to 225 mmHg.  A midline incision was   made followed by median parapatellar arthrotomy.  Following initial   exposure, attention was  first directed to the patella.  Precut   measurement was noted to be 19 mm.  I resected down to 13 mm and used a   32 anatomic patellar button to restore patellar height as well as cover the cut surface.      The lug holes were drilled and a metal shim was placed to protect the   patella from retractors and saw blade during the procedure.      At this point, attention was now directed to the femur.  The femoral   canal was opened with a drill, irrigated to try to prevent fat emboli.  An   intramedullary rod was passed at 3 degrees valgus, 9 mm of bone was   resected off the distal femur.  Following this resection, the tibia was   subluxated anteriorly.  Using the extramedullary guide, 2 mm of bone was resected off   the proximal medial tibia.  We confirmed the gap would be   stable medially and laterally with a size 5 spacer block as well as confirmed that the tibial cut was perpendicular in the coronal plane, checking with an alignment rod.      Once this was done, I sized the femur to be a size 4 in the anterior-   posterior dimension, chose a narrow component  based on medial and   lateral dimension.  The size 4 rotation block was then pinned in   position anterior referenced using the C-clamp to set rotation.  The   anterior, posterior, and  chamfer cuts were made without difficulty nor   notching making certain that I was along the anterior cortex to help   with flexion gap stability.      The final box cut was made off the lateral aspect of distal femur.      At this point, the tibia was sized to be a size 4.  The size 4 tray was   then pinned in position through the medial third of the tubercle,   drilled, and keel punched.  Trial reduction was now carried with a 4 femur,  4 tibia, a size 7 mm CR insert, and the 32 anatomic patella botton.  The knee was brought to full extension with good flexion stability with the patella   tracking through the trochlea without application of pressure.  Given   all these findings the trial components removed.  Final components were   opened and cement was mixed.  The knee was irrigated with normal saline solution and pulse lavage.  The synovial lining was   then injected with 30 cc of 0.25% Marcaine  with epinephrine , 1 cc of Toradol  and 30 cc of NS for a total of 61 cc.     Final implants were then cemented onto cleaned and dried cut surfaces of bone with the knee brought to extension with a size 7 mm CR trial insert.      Once the cement had fully cured, excess cement was removed   throughout the knee.  I confirmed that I was satisfied with the range of   motion and stability, and the final size 7 mm CR MS AOX insert was chosen.  It was   placed into the knee.      The tourniquet had been let down at 27 minutes.  No significant   hemostasis was required.  The extensor mechanism was then reapproximated using #1 Vicryl and #1 Stratafix sutures with the knee   in flexion.  The  remaining wound was closed with 2-0 Vicryl and running 4-0 Monocryl.   The knee was cleaned, dried, dressed sterilely using  Dermabond and   Aquacel dressing.  The patient was then   brought to recovery room in stable condition, tolerating the procedure   well.   Please note that Physician Assistant, Randine Ricks, PA-C was present for the entirety of the case, and was utilized for pre-operative positioning, peri-operative retractor management, general facilitation of the procedure and for primary wound closure at the end of the case.              Donnice CORDOBA Ernie, M.D.    03/03/2024 8:38 AM

## 2024-03-03 NOTE — Evaluation (Signed)
 Physical Therapy Evaluation Patient Details Name: Kathy Duran MRN: [id number] DOB: 08-21-1945 Today's Date: 03/03/2024  History of Present Illness  78 yo female presents to therapy s/p R TKA on 03/03/2024 due to failure of conservative measures. 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  Clinical Impression      Kathy Duran is a 78 y.o. female POD 0 s/p R TKA. Patient reports mod I with mobility at baseline. Patient is now limited by functional impairments (see PT problem list below) and requires min A for bed mobility and CGA and cues for transfers. Patient was able to ambulate 8 feet with RW and CGA level of assist. Patient instructed in exercise to facilitate ROM and circulation to manage edema. Patient will benefit from continued skilled PT interventions to address impairments and progress towards PLOF. Acute PT will follow to progress mobility and stair training in preparation for safe discharge home with family support and [facility] services. PT arrived 1631 and pt eating a meal, spouse present, PT returned at 1654 to complete Eval.    If plan is discharge home, recommend the following: A little help with walking and/or transfers;A little help with bathing/dressing/bathroom;Assistance with cooking/housework;Assist for transportation;Help with stairs or ramp for entrance   Can travel by private vehicle        Equipment Recommendations Rolling walker (2 wheels)  Recommendations for Other Services       Functional Status Assessment Patient has had a recent decline in their functional status and demonstrates the ability to make significant improvements in function in a reasonable and predictable amount of time.     Precautions / Restrictions Precautions Precautions: Knee;Fall Restrictions Weight Bearing Restrictions Per Provider Order: No       Mobility  Bed Mobility Overal bed mobility: Needs Assistance Bed Mobility: Supine to Sit     Supine to sit: Min assist, HOB elevated     General bed mobility comments: cues, use of hospital bed and min A    Transfers Overall transfer level: Needs assistance Equipment used: Rolling walker (2 wheels) Transfers: Sit to/from Stand Sit to Stand: Contact guard assist           General transfer comment: min cues and pt able to push to stand    Ambulation/Gait Ambulation/Gait assistance: Contact guard assist Gait Distance (Feet): 8 Feet Assistive device: Rolling walker (2 wheels) Gait Pattern/deviations: Step-to pattern, Decreased stance time - right, Antalgic, Trunk flexed Gait velocity: decreased     General Gait Details: B UE support at RW to offload R LE in stance phase, pt is aprehensive about increasing pain levels with mobiltiy, cues for safety, posture and encouragement  Stairs            Wheelchair Mobility     Tilt Bed    Modified Rankin (Stroke Patients Only)       Balance Overall balance assessment: Needs assistance Sitting-balance support: Feet supported Sitting balance-Leahy Scale: Good     Standing balance support: Bilateral upper extremity supported, During functional activity, Reliant on assistive device for balance Standing balance-Leahy Scale: Poor                               Pertinent Vitals/Pain Pain Assessment Pain Assessment: 0-10 Pain Score: 7  Pain Location: R knee  and LE Pain Descriptors / Indicators: Aching, Constant, Discomfort, Dull, Grimacing, Operative site guarding, Tender Pain Intervention(s): Limited activity within patient's tolerance, Monitored during session, Premedicated before session, Repositioned, Ice applied    Home Living Family/patient expects to be discharged to:: Private residence Living Arrangements: Spouse/significant other Available Help at Discharge: Family Type of Home: House Home  Access: Stairs to enter Entrance Stairs-Rails: Right Entrance Stairs-Number of Steps: 2   Home Layout: One level Home Equipment: Cane - single point;Shower seat;Toilet riser ([employer]) Additional Comments: ice man machine    Prior Function Prior Level of Function : Independent/Modified Independent             Mobility Comments: pt mod I with 3 ww, limited community navigation mod I for all ADLs and self care tasks pt is no longer driving       Extremity/Trunk Assessment        Lower Extremity Assessment Lower Extremity Assessment: RLE deficits/detail RLE Deficits / Details: ankle DF/PF 5/5; SLR , 10 degree lag RLE Sensation: WNL    Cervical / Trunk Assessment Cervical / Trunk Assessment: Normal  Communication   Communication Communication: No apparent difficulties    Cognition Arousal: Alert Behavior During Therapy: WFL for tasks assessed/performed   PT - Cognitive impairments: No apparent impairments                         Following commands: Intact       Cueing       General Comments      Exercises Total Joint Exercises Ankle Circles/Pumps: AROM, Both, 10 reps   Assessment/Plan    PT Assessment Patient needs continued PT services  PT Problem List Decreased strength;Decreased range of motion;Decreased activity tolerance;Decreased balance;Decreased mobility;Decreased coordination;Pain       PT Treatment Interventions DME instruction;Gait training;Stair training;Functional mobility training;Therapeutic activities;Therapeutic exercise;Balance training;Neuromuscular re-education;Patient/family education;Modalities    PT Goals (Current goals can be found in the Care Plan section)  Acute Rehab PT Goals Patient Stated Goal: to be able to be more active and continue to be instramental with church events PT Goal Formulation: With patient Time For Goal Achievement: 03/17/24 Potential to Achieve Goals: Good    Frequency 7X/week      Co-evaluation               AM-PAC PT 6 Clicks Mobility  Outcome Measure Help needed turning from your back to your side while in a flat bed without using bedrails?: A Little Help needed moving from lying on your back to sitting on the side of a flat bed without using bedrails?: A Little Help needed moving to and from a bed to a chair (including a wheelchair)?: A Little Help needed standing up from a chair using your arms (e.g., wheelchair or bedside chair)?: A Little Help needed to walk in hospital room?: A Little Help needed climbing 3-5 steps with a railing? : A Lot 6 Click Score: 17    End of Session Equipment Utilized During Treatment: Gait belt Activity Tolerance: Patient tolerated treatment well Patient left: in chair;with call bell/phone within reach;with chair alarm set Nurse Communication: Mobility status PT Visit Diagnosis: Unsteadiness on feet (R26.81);Other abnormalities of gait and mobility (R26.89);Muscle weakness (generalized) (M62.81);Difficulty in walking, not elsewhere classified (R26.2);Pain Pain - Right/Left: Right Pain - part of body: Knee;Leg    Time: 8345-8284 PT Time Calculation (min) (ACUTE ONLY): 21 min   Charges:   PT Evaluation $PT Eval Low Complexity: 1  Low   PT General Charges $$ ACUTE PT VISIT: 1 Visit         [place], PT Acute Rehab   [place] VEAR Drone 03/03/2024, 6:06 PM

## 2024-03-03 NOTE — Care Plan (Signed)
 Ortho Bundle Case Management Note  Patient Details  Name: Kathy Duran MRN: [id number] Date of Birth: [birth year]  RT TKA on 03/03/24  DCP: Home with husband DME: RW, ordered through [facility] PT: HHPT([facility]) w/ transition to OPPT                   DME Arranged:  Walker rolling DME Agency:  [employer]  HH Arranged:    HH Agency:     Additional Comments: Please contact me with any questions of if this plan should need to change.  Burnard Dross, Case Manager [facility] [phone]  Ext. [phone]   03/03/2024, 8:05 AM

## 2024-03-04 ENCOUNTER — Encounter (HOSPITAL_COMMUNITY): Payer: Self-pay | Admitting: Orthopedic Surgery

## 2024-03-04 DIAGNOSIS — Z96643 Presence of artificial hip joint, bilateral: Secondary | ICD-10-CM | POA: Diagnosis not present

## 2024-03-04 DIAGNOSIS — I10 Essential (primary) hypertension: Secondary | ICD-10-CM | POA: Diagnosis not present

## 2024-03-04 DIAGNOSIS — Z79899 Other long term (current) drug therapy: Secondary | ICD-10-CM | POA: Diagnosis not present

## 2024-03-04 DIAGNOSIS — M1711 Unilateral primary osteoarthritis, right knee: Secondary | ICD-10-CM | POA: Diagnosis not present

## 2024-03-04 DIAGNOSIS — J45909 Unspecified asthma, uncomplicated: Secondary | ICD-10-CM | POA: Diagnosis not present

## 2024-03-04 DIAGNOSIS — Z7982 Long term (current) use of aspirin: Secondary | ICD-10-CM | POA: Diagnosis not present

## 2024-03-04 DIAGNOSIS — Z87891 Personal history of nicotine dependence: Secondary | ICD-10-CM | POA: Diagnosis not present

## 2024-03-04 DIAGNOSIS — Z91040 Latex allergy status: Secondary | ICD-10-CM | POA: Diagnosis not present

## 2024-03-04 LAB — BASIC METABOLIC PANEL WITH GFR
Anion gap: 8 (ref 5–15)
BUN: 13 mg/dL (ref 8–23)
CO2: 22 mmol/L (ref 22–32)
Calcium: 8.5 mg/dL — ABNORMAL LOW (ref 8.9–10.3)
Chloride: 107 mmol/L (ref 98–111)
Creatinine, Ser: 0.61 mg/dL (ref 0.44–1.00)
GFR, Estimated: >60 mL/min (ref >60)
Glucose, Bld: 149 mg/dL — ABNORMAL HIGH (ref 70–99)
Potassium: 3.9 mmol/L (ref 3.5–5.1)
Sodium: 137 mmol/L (ref 135–145)

## 2024-03-04 LAB — CBC
HCT: 30 % — ABNORMAL LOW (ref 36.0–46.0)
Hemoglobin: 9.3 g/dL — ABNORMAL LOW (ref 12.0–15.0)
MCH: 27.8 pg (ref 26.0–34.0)
MCHC: 31 g/dL (ref 30.0–36.0)
MCV: 89.8 fL (ref 80.0–100.0)
Platelets: 266 K/uL (ref 150–400)
RBC: 3.34 MIL/uL — ABNORMAL LOW (ref 3.87–5.11)
RDW: 17 % — ABNORMAL HIGH (ref 11.5–15.5)
WBC: 8.3 K/uL (ref 4.0–10.5)
nRBC: 0 % (ref 0.0–0.2)

## 2024-03-04 MED ORDER — KETOROLAC TROMETHAMINE 15 MG/ML IJ SOLN
7.5000 mg | Freq: Four times a day (QID) | INTRAMUSCULAR | Status: AC
  Administered 2024-03-04 (×2): 7.5 mg via INTRAVENOUS
  Filled 2024-03-04: qty 1

## 2024-03-04 MED ORDER — HYDROMORPHONE HCL 2 MG PO TABS
1.0000 mg | ORAL_TABLET | ORAL | Status: DC | PRN
  Administered 2024-03-04 – 2024-03-05 (×8): 2 mg via ORAL
  Filled 2024-03-04 (×5): qty 1

## 2024-03-04 MED ORDER — HYDROMORPHONE HCL 2 MG PO TABS
2.0000 mg | ORAL_TABLET | ORAL | Status: DC | PRN
  Administered 2024-03-05: 2 mg via ORAL
  Filled 2024-03-04: qty 1

## 2024-03-04 NOTE — Progress Notes (Signed)
 Physical Therapy Treatment Patient Details Name: Kathy Duran MRN: [id number] DOB: March 23, 1946 Today's Date: 03/04/2024   History of Present Illness 78 yo female presents to therapy s/p R TKA on 03/03/2024 due to failure of conservative measures. 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    PT Comments  Pt premedicated with dilaudid , pt reports improved pain control compared to toradol  that she had this morning. Pt tolerated increased ambulation distance of 20' with RW, distance limited by fatigue and pain.     If plan is discharge home, recommend the following: A little help with walking and/or transfers;A little help with bathing/dressing/bathroom;Assistance with cooking/housework;Assist for transportation;Help with stairs or ramp for entrance   Can travel by private vehicle        Equipment Recommendations  Rolling walker (2 wheels)    Recommendations for Other Services       Precautions / Restrictions Precautions Precautions: Knee;Fall Precaution Booklet Issued: Yes (comment) Recall of Precautions/Restrictions: Intact Precaution/Restrictions Comments: reviewed no pillow under knee Restrictions Weight Bearing Restrictions Per Provider Order: No     Mobility  Bed Mobility Overal bed mobility: Needs Assistance Bed Mobility: Sit to Supine     Supine to sit: Modified independent (Device/Increase time), HOB elevated, Used rails Sit to supine: Used rails, Min assist   General bed mobility comments: min A for RLE into bed    Transfers Overall transfer level: Needs assistance Equipment used: Rolling walker (2 wheels) Transfers: Sit to/from Stand Sit to Stand: From elevated surface, Contact guard assist           General transfer comment: VCs hand placement    Ambulation/Gait Ambulation/Gait assistance: Contact guard  assist Gait Distance (Feet): 20 Feet Assistive device: Rolling walker (2 wheels) Gait Pattern/deviations: Step-to pattern, Decreased stance time - right, Antalgic, Trunk flexed Gait velocity: decreased     General Gait Details: VCs sequencing and posture, distance limited by R knee pain (pt premedicated) and fatigue   Stairs             Wheelchair Mobility     Tilt Bed    Modified Rankin (Stroke Patients Only)       Balance Overall balance assessment: Needs assistance Sitting-balance support: Feet supported Sitting balance-Leahy Scale: Good     Standing balance support: Bilateral upper extremity supported, During functional activity, Reliant on assistive device for balance Standing balance-Leahy Scale: Poor                              Communication Communication Communication: No apparent difficulties  Cognition Arousal: Alert Behavior During Therapy: WFL for tasks assessed/performed   PT - Cognitive impairments: No apparent impairments                         Following commands: Intact      Cueing    Exercises Total Joint Exercises Ankle Circles/Pumps: AROM, Both, 10 reps  Knee Flexion: AAROM, Right, 10 reps, Seated Goniometric ROM: 5- 50* AAROM R knee    General Comments        Pertinent Vitals/Pain Pain Assessment Pain Score: 4  Pain Location: R knee with walking Pain Descriptors / Indicators: Aching, Discomfort, Grimacing, Operative site guarding Pain Intervention(s): Limited activity within patient's tolerance, Monitored during session, Premedicated before  session, Ice applied, Repositioned    Home Living                          Prior Function            PT Goals (current goals can now be found in the care plan section) Acute Rehab PT Goals Patient Stated Goal: to be able to be more active and continue to be instramental with church events PT Goal Formulation: With patient Time For Goal Achievement:  03/17/24 Potential to Achieve Goals: Good Progress towards PT goals: Progressing toward goals    Frequency    7X/week      PT Plan      Co-evaluation              AM-PAC PT 6 Clicks Mobility   Outcome Measure  Help needed turning from your back to your side while in a flat bed without using bedrails?: A Little Help needed moving from lying on your back to sitting on the side of a flat bed without using bedrails?: A Little Help needed moving to and from a bed to a chair (including a wheelchair)?: A Little Help needed standing up from a chair using your arms (e.g., wheelchair or bedside chair)?: A Little Help needed to walk in hospital room?: A Little Help needed climbing 3-5 steps with a railing? : A Lot 6 Click Score: 17    End of Session Equipment Utilized During Treatment: Gait belt Activity Tolerance: Patient tolerated treatment well Patient left: with call bell/phone within reach;in bed;with SCD's reapplied;with bed alarm set Nurse Communication: Mobility status PT Visit Diagnosis: Unsteadiness on feet (R26.81);Other abnormalities of gait and mobility (R26.89);Muscle weakness (generalized) (M62.81);Difficulty in walking, not elsewhere classified (R26.2);Pain Pain - Right/Left: Right Pain - part of body: Knee     Time: 1410-1434 PT Time Calculation (min) (ACUTE ONLY): 24 min  Charges:    $Gait Training: 8-22 mins $Therapeutic Activity: 8-22 mins PT General Charges $$ ACUTE PT VISIT: 1 Visit                     Sylvan Delon Copp PT 03/04/2024  Acute Rehabilitation Services  Office [phone]

## 2024-03-04 NOTE — TOC Transition Note (Signed)
 Transition of Care [facility]) - Discharge Note   Patient Details  Name: Kathy Duran MRN: [id number] Date of Birth: October 18, 1945  Transition of Care [facility]) CM/SW Contact:  Alfonse JONELLE Rex, RN Phone Number: 03/04/2024, 10:27 AM   Clinical Narrative:  Met with patient at bedside to review dc therapy and home equipment needs, pt confirmed HHPT ([facility]) , RW delivered to bedside by [employer]. MOON completed. No TOC needs.      Final next level of care: Home w Home Health Services Barriers to Discharge: No Barriers Identified   Patient Goals and CMS Choice Patient states their goals for this hospitalization and ongoing recovery are:: return home          Discharge Placement                       Discharge Plan and Services Additional resources added to the After Visit Summary for                  DME Arranged: Walker rolling DME Agency: [employer]                  Social Drivers of Health (SDOH) Interventions SDOH Screenings   Food Insecurity: No Food Insecurity (03/03/2024)  Housing: Low Risk  (03/03/2024)  Transportation Needs: No Transportation Needs (03/03/2024)  Utilities: Not At Risk (03/03/2024)  Financial Resource Strain: Low Risk  (06/28/2022)   Received from [facility]  Social Connections: Socially Isolated (03/03/2024)  Tobacco Use: Medium Risk (03/03/2024)     Readmission Risk Interventions     No data to display

## 2024-03-04 NOTE — Progress Notes (Signed)
 Subjective: 1 Day Post-Op Procedure(s) (LRB): ARTHROPLASTY, KNEE, TOTAL (Right) Patient reports pain as moderate. Patient seen in rounds for Dr. Ernie. Patient is well, and has had no acute complaints or problems. No acute events overnight. Foley catheter removed. Patient ambulated 8 feet with PT.  We will start therapy today.   Objective: Vital signs in last 24 hours: Temp:  [97 F (36.1 C)-98.5 F (36.9 C)] 98.2 F (36.8 C) (08/13 0538) Pulse Rate:  [71-88] 82 (08/13 0538) Resp:  [9-17] 16 (08/13 0538) BP: (119-182)/(55-116) 119/55 (08/13 0538) SpO2:  [89 %-100 %] 100 % (08/13 0538) Weight:  [94.4 kg-95 kg] 95 kg (08/12 1600)  Intake/Output from previous day:  Intake/Output Summary (Last 24 hours) at 03/04/2024 0801 Last data filed at 03/04/2024 0600 Gross per 24 hour  Intake 3030.71 ml  Output 1200 ml  Net 1830.71 ml     Intake/Output this shift: No intake/output data recorded.  Labs: Recent Labs    03/04/24 0349  HGB 9.3*   Recent Labs    03/04/24 0349  WBC 8.3  RBC 3.34*  HCT 30.0*  PLT 266   Recent Labs    03/04/24 0349  NA 137  K 3.9  CL 107  CO2 22  BUN 13  CREATININE 0.61  GLUCOSE 149*  CALCIUM  8.5*   No results for input(s): LABPT, INR in the last 72 hours.  Exam: General - Patient is Alert and Oriented Extremity - Neurologically intact Sensation intact distally Intact pulses distally Dorsiflexion/Plantar flexion intact Dressing - dressing C/D/I Motor Function - intact, moving foot and toes well on exam.   Past Medical History:  Diagnosis Date   Anemia    Anxiety    Arthritis    left hip   Asthma    Inhalers for tx   Bilateral primary osteoarthritis of knee 07/06/2016   Moderate   Bilateral swelling of feet    bilateral ankle and feet edema   Bruises easily    Complication of anesthesia    sometimes needs oxygen  when waking uo due to asthma, pain block caused paralysis   DDD (degenerative disc disease), lumbar  07/06/2016   Fibromyalgia    GERD (gastroesophageal reflux disease)    Hypertension    Myofacial muscle pain 07/06/2016   Osteoarthritis of both hands 07/06/2016   Pre-diabetes    Seasonal allergies 07/06/2016   Sinus complaint    Sjogren's syndrome (HCC)    dry eyes   Tenonitis    Urinary incontinence    occ. an issue wears pads    Assessment/Plan: 1 Day Post-Op Procedure(s) (LRB): ARTHROPLASTY, KNEE, TOTAL (Right) Principal Problem:   S/P total knee arthroplasty, right  Estimated body mass index is 34.85 kg/m as calculated from the following:   Height as of this encounter: 5' 5 (1.651 m).   Weight as of this encounter: 95 kg. Advance diet Up with therapy D/C IV fluids   Patient's anticipated LOS is less than 2 midnights, meeting these requirements: - Younger than 17 - Lives within 1 hour of care - Has a competent adult at home to recover with post-op recover - NO history of  - Chronic pain requiring opiods  - Diabetes  - Coronary Artery Disease  - Heart failure  - Heart attack  - Stroke  - DVT/VTE  - Cardiac arrhythmia  - Respiratory Failure/COPD  - Renal failure  - Anemia  - Advanced Liver disease     DVT Prophylaxis - Aspirin  Weight bearing as tolerated.  Hgb stable at 9.3 this AM  She is having difficulty with pain, but also notes itching which she feels is related to her pain medication  Will d/c oxycodone  and switch to dilaudid  Will add 2 doses toradol  today to help with pain  Up with PT, but anticipate she will require another day in order to progress to level needed at home. She has minimal help from her spouse.    Rosina Calin, PA-C Orthopedic Surgery [phone] 03/04/2024, 8:01 AM

## 2024-03-04 NOTE — Progress Notes (Signed)
 Physical Therapy Treatment Patient Details Name: Kathy Duran MRN: [id number] DOB: [birth year] Today's Date: 03/04/2024   History of Present Illness 78 yo female presents to therapy s/p R TKA on 03/03/2024 due to failure of conservative measures. 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    PT Comments  Pt ambulated 14' with RW, distance limited by 9/10 pain R knee, pt was premedicated. Instructed pt in TKA HEP. Pt puts forth good effort, pain is primary factor limiting activity tolerance.     If plan is discharge home, recommend the following: A little help with walking and/or transfers;A little help with bathing/dressing/bathroom;Assistance with cooking/housework;Assist for transportation;Help with stairs or ramp for entrance   Can travel by private vehicle        Equipment Recommendations  Rolling walker (2 wheels)    Recommendations for Other Services       Precautions / Restrictions Precautions Precautions: Knee;Fall Precaution Booklet Issued: Yes (comment) Recall of Precautions/Restrictions: Intact Precaution/Restrictions Comments: reviewed no pillow under knee Restrictions Weight Bearing Restrictions Per Provider Order: No     Mobility  Bed Mobility Overal bed mobility: Modified Independent Bed Mobility: Supine to Sit     Supine to sit: Modified independent (Device/Increase time), HOB elevated, Used rails     General bed mobility comments: used gait belt as RLE lifter, increased time    Transfers Overall transfer level: Needs assistance Equipment used: Rolling walker (2 wheels) Transfers: Sit to/from Stand Sit to Stand: From elevated surface, Min assist           General transfer comment: min A to power up    Ambulation/Gait Ambulation/Gait assistance: Contact guard assist Gait Distance (Feet): 14  Feet Assistive device: Rolling walker (2 wheels) Gait Pattern/deviations: Step-to pattern, Decreased stance time - right, Antalgic, Trunk flexed Gait velocity: decreased     General Gait Details: VCs sequencing and posture, distance limited by 9/10 R knee pain (pt premedicated)   Stairs             Wheelchair Mobility     Tilt Bed    Modified Rankin (Stroke Patients Only)       Balance Overall balance assessment: Needs assistance Sitting-balance support: Feet supported Sitting balance-Leahy Scale: Good     Standing balance support: Bilateral upper extremity supported, During functional activity, Reliant on assistive device for balance Standing balance-Leahy Scale: Poor                              Communication Communication Communication: No apparent difficulties  Cognition Arousal: Alert Behavior During Therapy: WFL for tasks assessed/performed   PT - Cognitive impairments: No apparent impairments                         Following commands: Intact      Cueing    Exercises Total Joint Exercises Ankle Circles/Pumps: AROM, Both, 10 reps Quad Sets: AROM, Both, 5 reps, Supine Short Arc Quad: AAROM, Right, 5 reps, Supine Heel Slides: AAROM, Right, 5 reps, Supine Hip ABduction/ADduction: AAROM, Right, 5 reps, Supine Straight Leg Raises: AAROM, Right, 5 reps, Supine Knee Flexion: AAROM, Right, 10 reps, Seated Goniometric ROM: 5- 50* AAROM R knee    General Comments        Pertinent Vitals/Pain Pain  Assessment Pain Score: 9  Pain Location: R knee with walking Pain Descriptors / Indicators: Aching, Constant, Discomfort, Dull, Grimacing, Operative site guarding, Tender Pain Intervention(s): Limited activity within patient's tolerance, Monitored during session, Premedicated before session, Repositioned, Ice applied    Home Living                          Prior Function            PT Goals (current goals can now be  found in the care plan section) Acute Rehab PT Goals Patient Stated Goal: to be able to be more active and continue to be instramental with church events PT Goal Formulation: With patient Time For Goal Achievement: 03/17/24 Potential to Achieve Goals: Good Progress towards PT goals: Progressing toward goals    Frequency    7X/week      PT Plan      Co-evaluation              AM-PAC PT 6 Clicks Mobility   Outcome Measure  Help needed turning from your back to your side while in a flat bed without using bedrails?: A Little Help needed moving from lying on your back to sitting on the side of a flat bed without using bedrails?: A Little Help needed moving to and from a bed to a chair (including a wheelchair)?: A Little Help needed standing up from a chair using your arms (e.g., wheelchair or bedside chair)?: A Little Help needed to walk in hospital room?: A Little Help needed climbing 3-5 steps with a railing? : A Lot 6 Click Score: 17    End of Session Equipment Utilized During Treatment: Gait belt Activity Tolerance: Patient tolerated treatment well Patient left: in chair;with call bell/phone within reach;with chair alarm set;with family/visitor present Nurse Communication: Mobility status PT Visit Diagnosis: Unsteadiness on feet (R26.81);Other abnormalities of gait and mobility (R26.89);Muscle weakness (generalized) (M62.81);Difficulty in walking, not elsewhere classified (R26.2);Pain Pain - Right/Left: Right Pain - part of body: Knee     Time: 9096-9067 PT Time Calculation (min) (ACUTE ONLY): 29 min  Charges:    $Gait Training: 8-22 mins $Therapeutic Exercise: 8-22 mins PT General Charges $$ ACUTE PT VISIT: 1 Visit                     Sylvan Nest Kistler PT 03/04/2024  Acute Rehabilitation Services  Office [phone]

## 2024-03-04 NOTE — Care Management Obs Status (Signed)
 MEDICARE OBSERVATION STATUS NOTIFICATION   Patient Details  Name: Kathy Duran MRN: [id number] Date of Birth: [birth year]   Medicare Observation Status Notification Given:  Yes    Alfonse JONELLE Rex, RN 03/04/2024, 9:29 AM

## 2024-03-05 ENCOUNTER — Other Ambulatory Visit (HOSPITAL_COMMUNITY): Payer: Self-pay

## 2024-03-05 DIAGNOSIS — Z7982 Long term (current) use of aspirin: Secondary | ICD-10-CM | POA: Diagnosis not present

## 2024-03-05 DIAGNOSIS — Z96643 Presence of artificial hip joint, bilateral: Secondary | ICD-10-CM | POA: Diagnosis not present

## 2024-03-05 DIAGNOSIS — Z79899 Other long term (current) drug therapy: Secondary | ICD-10-CM | POA: Diagnosis not present

## 2024-03-05 DIAGNOSIS — I10 Essential (primary) hypertension: Secondary | ICD-10-CM | POA: Diagnosis not present

## 2024-03-05 DIAGNOSIS — Z87891 Personal history of nicotine dependence: Secondary | ICD-10-CM | POA: Diagnosis not present

## 2024-03-05 DIAGNOSIS — J45909 Unspecified asthma, uncomplicated: Secondary | ICD-10-CM | POA: Diagnosis not present

## 2024-03-05 DIAGNOSIS — M1711 Unilateral primary osteoarthritis, right knee: Secondary | ICD-10-CM | POA: Diagnosis not present

## 2024-03-05 DIAGNOSIS — Z91040 Latex allergy status: Secondary | ICD-10-CM | POA: Diagnosis not present

## 2024-03-05 LAB — CBC
HCT: 28.5 % — ABNORMAL LOW (ref 36.0–46.0)
Hemoglobin: 9.1 g/dL — ABNORMAL LOW (ref 12.0–15.0)
MCH: 28.8 pg (ref 26.0–34.0)
MCHC: 31.9 g/dL (ref 30.0–36.0)
MCV: 90.2 fL (ref 80.0–100.0)
Platelets: 253 K/uL (ref 150–400)
RBC: 3.16 MIL/uL — ABNORMAL LOW (ref 3.87–5.11)
RDW: 17.7 % — ABNORMAL HIGH (ref 11.5–15.5)
WBC: 5.9 K/uL (ref 4.0–10.5)
nRBC: 0 % (ref 0.0–0.2)

## 2024-03-05 MED ORDER — CYCLOBENZAPRINE HCL 5 MG PO TABS
5.0000 mg | ORAL_TABLET | Freq: Three times a day (TID) | ORAL | 0 refills | Status: AC | PRN
  Filled 2024-03-05: qty 40, 14d supply, fill #0

## 2024-03-05 MED ORDER — ASPIRIN 81 MG PO CHEW
81.0000 mg | CHEWABLE_TABLET | Freq: Two times a day (BID) | ORAL | 0 refills | Status: AC
  Filled 2024-03-05: qty 56, 28d supply, fill #0

## 2024-03-05 MED ORDER — SENNA 8.6 MG PO TABS
2.0000 | ORAL_TABLET | Freq: Every day | ORAL | 0 refills | Status: AC
  Filled 2024-03-05: qty 28, 14d supply, fill #0

## 2024-03-05 MED ORDER — HYDROMORPHONE HCL 2 MG PO TABS
2.0000 mg | ORAL_TABLET | ORAL | 0 refills | Status: AC | PRN
  Filled 2024-03-05: qty 42, 7d supply, fill #0

## 2024-03-05 MED ORDER — POLYETHYLENE GLYCOL 3350 17 GM/SCOOP PO POWD
17.0000 g | Freq: Two times a day (BID) | ORAL | 0 refills | Status: AC
  Filled 2024-03-05: qty 238, 7d supply, fill #0

## 2024-03-05 NOTE — Progress Notes (Signed)
 Removed IV-CDI. Reviewed d/c paperwork with patient and answered questions. TOC meds and walker delivered to her room. Wheeled stable patient and belongings to main entrance where she was picked up by her husband.

## 2024-03-05 NOTE — Progress Notes (Signed)
   Subjective: 2 Days Post-Op Procedure(s) (LRB): ARTHROPLASTY, KNEE, TOTAL (Right) Patient reports pain as mild.   Patient seen in rounds for Dr. Ernie. Patient is well, and has had no acute complaints or problems. No acute events overnight. She ambulated 20 feet with PT yesterday.  We will start therapy today.   Objective: Vital signs in last 24 hours: Temp:  [98.3 F (36.8 C)-99.3 F (37.4 C)] 99 F (37.2 C) (08/14 0620) Pulse Rate:  [82-87] 83 (08/14 0620) Resp:  [18] 18 (08/14 0620) BP: (120-146)/(61-73) 146/70 (08/14 0620) SpO2:  [98 %-100 %] 98 % (08/14 0620)  Intake/Output from previous day:  Intake/Output Summary (Last 24 hours) at 03/05/2024 0924 Last data filed at 03/05/2024 9077 Gross per 24 hour  Intake 960 ml  Output 1350 ml  Net -390 ml     Intake/Output this shift: Total I/O In: 240 [P.O.:240] Out: -   Labs: Recent Labs    03/04/24 0349 03/05/24 0342  HGB 9.3* 9.1*   Recent Labs    03/04/24 0349 03/05/24 0342  WBC 8.3 5.9  RBC 3.34* 3.16*  HCT 30.0* 28.5*  PLT 266 253   Recent Labs    03/04/24 0349  NA 137  K 3.9  CL 107  CO2 22  BUN 13  CREATININE 0.61  GLUCOSE 149*  CALCIUM  8.5*   No results for input(s): LABPT, INR in the last 72 hours.  Exam: General - Patient is Alert and Oriented Extremity - Neurologically intact Sensation intact distally Intact pulses distally Dorsiflexion/Plantar flexion intact Dressing - dressing C/D/I Motor Function - intact, moving foot and toes well on exam.   Past Medical History:  Diagnosis Date   Anemia    Anxiety    Arthritis    left hip   Asthma    Inhalers for tx   Bilateral primary osteoarthritis of knee 07/06/2016   Moderate   Bilateral swelling of feet    bilateral ankle and feet edema   Bruises easily    Complication of anesthesia    sometimes needs oxygen  when waking uo due to asthma, pain block caused paralysis   DDD (degenerative disc disease), lumbar 07/06/2016    Fibromyalgia    GERD (gastroesophageal reflux disease)    Hypertension    Myofacial muscle pain 07/06/2016   Osteoarthritis of both hands 07/06/2016   Pre-diabetes    Seasonal allergies 07/06/2016   Sinus complaint    Sjogren's syndrome (HCC)    dry eyes   Tenonitis    Urinary incontinence    occ. an issue wears pads    Assessment/Plan: 2 Days Post-Op Procedure(s) (LRB): ARTHROPLASTY, KNEE, TOTAL (Right) Principal Problem:   S/P total knee arthroplasty, right  Estimated body mass index is 34.85 kg/m as calculated from the following:   Height as of this encounter: 5' 5 (1.651 m).   Weight as of this encounter: 95 kg. Advance diet Up with therapy D/C IV fluids    DVT Prophylaxis - Aspirin  Weight bearing as tolerated.  Hgb stable at 9.1 this AM  Pain decently well controlled with hydromorphone  Reduced itching  Up with PT [facility] for d/c today, but depends on progress with PT.  Rosina Calin, PA-C Orthopedic Surgery [phone] 03/05/2024, 9:24 AM

## 2024-03-05 NOTE — Plan of Care (Signed)
   Problem: Education: Goal: Knowledge of General Education information will improve Description Including pain rating scale, medication(s)/side effects and non-pharmacologic comfort measures Outcome: Progressing   Problem: Health Behavior/Discharge Planning: Goal: Ability to manage health-related needs will improve Outcome: Progressing

## 2024-03-05 NOTE — Progress Notes (Signed)
 Discharge medications delivered to patient at bedside [facility]

## 2024-03-05 NOTE — Progress Notes (Signed)
 Physical Therapy Treatment Patient Details Name: Kathy Duran MRN: [id number] DOB: 1945-12-23 Today's Date: 03/05/2024   History of Present Illness 78 yo female presents to therapy s/p R TKA on 03/03/2024 due to failure of conservative measures. 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    PT Comments  Pt is progressing with activity tolerance, she ambulated 73' with RW, distance limited by R knee pain. Reviewed TKA HEP, pt unable to tolerate long arc quad 2* pain but tolerated all other exercises. Will plan to do stair training this afternoon, then I expect she'll be ready to DC home from a PT standpoint.     If plan is discharge home, recommend the following: A little help with walking and/or transfers;A little help with bathing/dressing/bathroom;Assistance with cooking/housework;Assist for transportation;Help with stairs or ramp for entrance   Can travel by private vehicle        Equipment Recommendations  Rolling walker (2 wheels)    Recommendations for Other Services       Precautions / Restrictions Precautions Precautions: Knee;Fall Precaution Booklet Issued: Yes (comment) Recall of Precautions/Restrictions: Intact Precaution/Restrictions Comments: reviewed no pillow under knee Restrictions Weight Bearing Restrictions Per Provider Order: No RLE Weight Bearing Per Provider Order: Weight bearing as tolerated     Mobility  Bed Mobility Overal bed mobility: Modified Independent Bed Mobility: Supine to Sit     Supine to sit: Modified independent (Device/Increase time), HOB elevated, Used rails     General bed mobility comments: gait belt used as RLE lifter    Transfers Overall transfer level: Needs assistance Equipment used: Rolling walker (2 wheels) Transfers: Sit to/from Stand Sit to Stand: From elevated surface, Contact  guard assist           General transfer comment: VCs hand placement    Ambulation/Gait Ambulation/Gait assistance: Supervision Gait Distance (Feet): 34 Feet Assistive device: Rolling walker (2 wheels) Gait Pattern/deviations: Step-to pattern, Decreased stance time - right, Antalgic, Trunk flexed Gait velocity: decreased     General Gait Details: VCs sequencing and posture, distance limited by R knee pain (pt premedicated) and fatigue   Stairs             Wheelchair Mobility     Tilt Bed    Modified Rankin (Stroke Patients Only)       Balance Overall balance assessment: Needs assistance Sitting-balance support: Feet supported Sitting balance-Leahy Scale: Good     Standing balance support: Bilateral upper extremity supported, During functional activity, Reliant on assistive device for balance Standing balance-Leahy Scale: Poor                              Communication Communication Communication: No apparent difficulties  Cognition Arousal: Alert Behavior During Therapy: WFL for tasks assessed/performed   PT - Cognitive impairments: No apparent impairments                         Following commands: Intact      Cueing    Exercises Total Joint Exercises Ankle Circles/Pumps: AROM, Both, 10 reps Quad Sets: AROM, Both, 5 reps, Supine Short Arc Quad: AAROM, Right, 5 reps, Supine Heel Slides: AAROM, Right, 5 reps, Supine Hip ABduction/ADduction: AAROM, Right, 5 reps, Supine Straight Leg Raises: AAROM, Right, 5 reps, Supine  Knee Flexion: AAROM, Right, Seated, 5 reps Goniometric ROM: 5-50* AAROM R knee    General Comments        Pertinent Vitals/Pain Pain Assessment Pain Score: 6  Pain Location: R knee with walking Pain Descriptors / Indicators: Aching, Discomfort, Grimacing, Operative site guarding Pain Intervention(s): Limited activity within patient's tolerance, Monitored during session, Premedicated before session,  Repositioned, Ice applied    Home Living                          Prior Function            PT Goals (current goals can now be found in the care plan section) Acute Rehab PT Goals Patient Stated Goal: to be able to be more active and continue to be instramental with church events PT Goal Formulation: With patient Time For Goal Achievement: 03/17/24 Potential to Achieve Goals: Good Progress towards PT goals: Progressing toward goals    Frequency    7X/week      PT Plan      Co-evaluation              AM-PAC PT 6 Clicks Mobility   Outcome Measure  Help needed turning from your back to your side while in a flat bed without using bedrails?: A Little Help needed moving from lying on your back to sitting on the side of a flat bed without using bedrails?: A Little Help needed moving to and from a bed to a chair (including a wheelchair)?: A Little Help needed standing up from a chair using your arms (e.g., wheelchair or bedside chair)?: None Help needed to walk in hospital room?: None Help needed climbing 3-5 steps with a railing? : A Little 6 Click Score: 20    End of Session Equipment Utilized During Treatment: Gait belt Activity Tolerance: Patient tolerated treatment well Patient left: with call bell/phone within reach;in bed;with bed alarm set;with family/visitor present Nurse Communication: Mobility status PT Visit Diagnosis: Unsteadiness on feet (R26.81);Other abnormalities of gait and mobility (R26.89);Muscle weakness (generalized) (M62.81);Difficulty in walking, not elsewhere classified (R26.2);Pain Pain - Right/Left: Right Pain - part of body: Knee     Time: 9083-9052 PT Time Calculation (min) (ACUTE ONLY): 31 min  Charges:    $Gait Training: 8-22 mins $Therapeutic Exercise: 8-22 mins PT General Charges $$ ACUTE PT VISIT: 1 Visit                     Sylvan Nest Kistler PT 03/05/2024  Acute Rehabilitation Services  Office  [phone]

## 2024-03-05 NOTE — Progress Notes (Signed)
 Physical Therapy Treatment Patient Details Name: Kathy Duran MRN: [id number] DOB: 1946/03/08 Today's Date: 03/05/2024   History of Present Illness 78 yo female presents to therapy s/p R TKA on 03/03/2024 due to failure of conservative measures. 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    PT Comments  Pt ascended 2 stairs backwards with spouse providing min A to steady RW. Pt attempted forwards and sideways techniques but was unable to ascend a step 2* pain with weightbearing through RLE. Pt stated she and spouse can manage her care at home, a neighbor will also be present for assisting her into the home. From a PT standpoint she is ready to DC home.     If plan is discharge home, recommend the following: A little help with walking and/or transfers;A little help with bathing/dressing/bathroom;Assistance with cooking/housework;Assist for transportation;Help with stairs or ramp for entrance   Can travel by private vehicle        Equipment Recommendations  Rolling walker (2 wheels)    Recommendations for Other Services       Precautions / Restrictions Precautions Precautions: Knee;Fall Precaution Booklet Issued: Yes (comment) Recall of Precautions/Restrictions: Intact Precaution/Restrictions Comments: reviewed no pillow under knee Restrictions Weight Bearing Restrictions Per Provider Order: No RLE Weight Bearing Per Provider Order: Weight bearing as tolerated     Mobility  Bed Mobility Overal bed mobility: Modified Independent Bed Mobility: Supine to Sit     Supine to sit: Modified independent (Device/Increase time), HOB elevated, Used rails     General bed mobility comments: up in recliner    Transfers Overall transfer level: Needs assistance Equipment used: Rolling walker (2 wheels) Transfers: Sit to/from Stand Sit to Stand:  From elevated surface, Supervision           General transfer comment: VCs hand placement    Ambulation/Gait Ambulation/Gait assistance: Supervision Gait Distance (Feet): 32 Feet Assistive device: Rolling walker (2 wheels) Gait Pattern/deviations: Step-to pattern, Decreased stance time - right, Antalgic, Trunk flexed Gait velocity: decreased     General Gait Details: VCs sequencing and posture, distance limited by R knee pain (pt premedicated) and fatigue   Stairs Stairs: Yes Stairs assistance: Contact guard assist, Min assist Stair Management: No rails, Step to pattern, With walker, Backwards Number of Stairs: 2 General stair comments: attempted forwards and sideways but pt couldn't step up 2* pain with WBing on RLE. Pt performed backwards techinique  with min A from spouse to steady RW, no buckling. Pt quite fatigued and reported significant pain but no buckling nor loss of balance.   Wheelchair Mobility     Tilt Bed    Modified Rankin (Stroke Patients Only)       Balance Overall balance assessment: Needs assistance Sitting-balance support: Feet supported Sitting balance-Leahy Scale: Good     Standing balance support: Bilateral upper extremity supported, During functional activity, Reliant on assistive device for balance Standing balance-Leahy Scale: Poor                              Communication Communication Communication: No apparent difficulties  Cognition Arousal: Alert Behavior During Therapy: WFL for tasks assessed/performed   PT - Cognitive impairments: No apparent impairments  Following commands: Intact      Cueing    Exercises Total Joint Exercises Ankle Circles/Pumps: AROM, Both, 20 reps, Supine Quad Sets: AROM, Both, 5 reps, Supine Short Arc Quad: AAROM, Right, 5 reps, Supine Heel Slides: AAROM, Right, 5 reps, Supine Hip ABduction/ADduction: AAROM, Right, 5 reps, Supine Straight Leg Raises:  AAROM, Right, 5 reps, Supine Knee Flexion: AAROM, Right, Seated, 5 reps Goniometric ROM: 5-50* AAROM R knee    General Comments        Pertinent Vitals/Pain Pain Assessment Pain Score: 7  Pain Location: R knee with walking Pain Descriptors / Indicators: Aching, Discomfort, Grimacing, Operative site guarding Pain Intervention(s): Limited activity within patient's tolerance, Monitored during session, Premedicated before session, Ice applied    Home Living                          Prior Function            PT Goals (current goals can now be found in the care plan section) Acute Rehab PT Goals Patient Stated Goal: to be able to be more active and continue to be instrumental with church events PT Goal Formulation: With patient Time For Goal Achievement: 03/17/24 Potential to Achieve Goals: Good Progress towards PT goals: Progressing toward goals    Frequency    7X/week      PT Plan      Co-evaluation              AM-PAC PT 6 Clicks Mobility   Outcome Measure  Help needed turning from your back to your side while in a flat bed without using bedrails?: A Little Help needed moving from lying on your back to sitting on the side of a flat bed without using bedrails?: A Little Help needed moving to and from a bed to a chair (including a wheelchair)?: A Little Help needed standing up from a chair using your arms (e.g., wheelchair or bedside chair)?: None Help needed to walk in hospital room?: None Help needed climbing 3-5 steps with a railing? : A Little 6 Click Score: 20    End of Session Equipment Utilized During Treatment: Gait belt Activity Tolerance: Patient tolerated treatment well;Patient limited by pain Patient left: with call bell/phone within reach;with family/visitor present;in chair;with chair alarm set Nurse Communication: Mobility status PT Visit Diagnosis: Unsteadiness on feet (R26.81);Other abnormalities of gait and mobility  (R26.89);Muscle weakness (generalized) (M62.81);Difficulty in walking, not elsewhere classified (R26.2);Pain Pain - Right/Left: Right Pain - part of body: Knee     Time: 1333-1415 PT Time Calculation (min) (ACUTE ONLY): 42 min  Charges:    $Gait Training: 8-22 mins $Therapeutic Activity: 23-37 mins PT General Charges $$ ACUTE PT VISIT: 1 Visit                     Sylvan Nest Kistler PT 03/05/2024  Acute Rehabilitation Services  Office [phone]

## 2024-03-05 NOTE — Plan of Care (Signed)
  Problem: Education: Goal: Knowledge of General Education information will improve Description: Including pain rating scale, medication(s)/side effects and non-pharmacologic comfort measures 03/05/2024 0945 by Delores Kirsch, RN Outcome: Adequate for Discharge 03/05/2024 0945 by Delores Kirsch, RN Outcome: Progressing   Problem: Health Behavior/Discharge Planning: Goal: Ability to manage health-related needs will improve 03/05/2024 0945 by Delores Kirsch, RN Outcome: Adequate for Discharge 03/05/2024 0945 by Delores Kirsch, RN Outcome: Progressing   Problem: Clinical Measurements: Goal: Ability to maintain clinical measurements within normal limits will improve Outcome: Adequate for Discharge Goal: Will remain free from infection Outcome: Adequate for Discharge Goal: Diagnostic test results will improve Outcome: Adequate for Discharge Goal: Respiratory complications will improve Outcome: Adequate for Discharge Goal: Cardiovascular complication will be avoided Outcome: Adequate for Discharge   Problem: Activity: Goal: Risk for activity intolerance will decrease Outcome: Adequate for Discharge   Problem: Nutrition: Goal: Adequate nutrition will be maintained Outcome: Adequate for Discharge   Problem: Coping: Goal: Level of anxiety will decrease Outcome: Adequate for Discharge   Problem: Elimination: Goal: Will not experience complications related to bowel motility Outcome: Adequate for Discharge Goal: Will not experience complications related to urinary retention Outcome: Adequate for Discharge   Problem: Pain Managment: Goal: General experience of comfort will improve and/or be controlled Outcome: Adequate for Discharge   Problem: Safety: Goal: Ability to remain free from injury will improve Outcome: Adequate for Discharge   Problem: Skin Integrity: Goal: Risk for impaired skin integrity will decrease Outcome: Adequate for Discharge   Problem: Education: Goal:  Knowledge of the prescribed therapeutic regimen will improve Outcome: Adequate for Discharge Goal: Individualized Educational Video(s) Outcome: Adequate for Discharge   Problem: Activity: Goal: Ability to avoid complications of mobility impairment will improve Outcome: Adequate for Discharge Goal: Range of joint motion will improve Outcome: Adequate for Discharge   Problem: Clinical Measurements: Goal: Postoperative complications will be avoided or minimized Outcome: Adequate for Discharge   Problem: Pain Management: Goal: Pain level will decrease with appropriate interventions Outcome: Adequate for Discharge   Problem: Skin Integrity: Goal: Will show signs of wound healing Outcome: Adequate for Discharge

## 2024-03-06 DIAGNOSIS — M48061 Spinal stenosis, lumbar region without neurogenic claudication: Secondary | ICD-10-CM | POA: Diagnosis not present

## 2024-03-06 DIAGNOSIS — Z471 Aftercare following joint replacement surgery: Secondary | ICD-10-CM | POA: Diagnosis not present

## 2024-03-06 DIAGNOSIS — M19041 Primary osteoarthritis, right hand: Secondary | ICD-10-CM | POA: Diagnosis not present

## 2024-03-06 DIAGNOSIS — I10 Essential (primary) hypertension: Secondary | ICD-10-CM | POA: Diagnosis not present

## 2024-03-06 DIAGNOSIS — M3500 Sicca syndrome, unspecified: Secondary | ICD-10-CM | POA: Diagnosis not present

## 2024-03-06 DIAGNOSIS — M069 Rheumatoid arthritis, unspecified: Secondary | ICD-10-CM | POA: Diagnosis not present

## 2024-03-06 DIAGNOSIS — Z96653 Presence of artificial knee joint, bilateral: Secondary | ICD-10-CM | POA: Diagnosis not present

## 2024-03-06 DIAGNOSIS — M19042 Primary osteoarthritis, left hand: Secondary | ICD-10-CM | POA: Diagnosis not present

## 2024-03-06 DIAGNOSIS — J45909 Unspecified asthma, uncomplicated: Secondary | ICD-10-CM | POA: Diagnosis not present

## 2024-03-08 DIAGNOSIS — M19042 Primary osteoarthritis, left hand: Secondary | ICD-10-CM | POA: Diagnosis not present

## 2024-03-08 DIAGNOSIS — M48061 Spinal stenosis, lumbar region without neurogenic claudication: Secondary | ICD-10-CM | POA: Diagnosis not present

## 2024-03-08 DIAGNOSIS — Z96653 Presence of artificial knee joint, bilateral: Secondary | ICD-10-CM | POA: Diagnosis not present

## 2024-03-08 DIAGNOSIS — I10 Essential (primary) hypertension: Secondary | ICD-10-CM | POA: Diagnosis not present

## 2024-03-08 DIAGNOSIS — Z471 Aftercare following joint replacement surgery: Secondary | ICD-10-CM | POA: Diagnosis not present

## 2024-03-08 DIAGNOSIS — M069 Rheumatoid arthritis, unspecified: Secondary | ICD-10-CM | POA: Diagnosis not present

## 2024-03-08 DIAGNOSIS — M19041 Primary osteoarthritis, right hand: Secondary | ICD-10-CM | POA: Diagnosis not present

## 2024-03-08 DIAGNOSIS — M3500 Sicca syndrome, unspecified: Secondary | ICD-10-CM | POA: Diagnosis not present

## 2024-03-08 DIAGNOSIS — J45909 Unspecified asthma, uncomplicated: Secondary | ICD-10-CM | POA: Diagnosis not present

## 2024-03-10 DIAGNOSIS — M48061 Spinal stenosis, lumbar region without neurogenic claudication: Secondary | ICD-10-CM | POA: Diagnosis not present

## 2024-03-10 DIAGNOSIS — M069 Rheumatoid arthritis, unspecified: Secondary | ICD-10-CM | POA: Diagnosis not present

## 2024-03-10 DIAGNOSIS — Z96653 Presence of artificial knee joint, bilateral: Secondary | ICD-10-CM | POA: Diagnosis not present

## 2024-03-10 DIAGNOSIS — M19042 Primary osteoarthritis, left hand: Secondary | ICD-10-CM | POA: Diagnosis not present

## 2024-03-10 DIAGNOSIS — M3500 Sicca syndrome, unspecified: Secondary | ICD-10-CM | POA: Diagnosis not present

## 2024-03-10 DIAGNOSIS — I10 Essential (primary) hypertension: Secondary | ICD-10-CM | POA: Diagnosis not present

## 2024-03-10 DIAGNOSIS — J45909 Unspecified asthma, uncomplicated: Secondary | ICD-10-CM | POA: Diagnosis not present

## 2024-03-10 DIAGNOSIS — Z471 Aftercare following joint replacement surgery: Secondary | ICD-10-CM | POA: Diagnosis not present

## 2024-03-10 DIAGNOSIS — M19041 Primary osteoarthritis, right hand: Secondary | ICD-10-CM | POA: Diagnosis not present

## 2024-03-10 NOTE — Discharge Summary (Signed)
 Patient ID: Kathy Duran MRN: [id number] DOB/AGE: 1945/08/22 78 y.o.  Admit date: 03/03/2024 Discharge date: 03/05/2024  Admission Diagnoses:  Right knee osteoarthritis  Discharge Diagnoses:  Principal Problem:   S/P total knee arthroplasty, right   Past Medical History:  Diagnosis Date   Anemia    Anxiety    Arthritis    left hip   Asthma    Inhalers for tx   Bilateral primary osteoarthritis of knee 07/06/2016   Moderate   Bilateral swelling of feet    bilateral ankle and feet edema   Bruises easily    Complication of anesthesia    sometimes needs oxygen  when waking uo due to asthma, pain block caused paralysis   DDD (degenerative disc disease), lumbar 07/06/2016   Fibromyalgia    GERD (gastroesophageal reflux disease)    Hypertension    Myofacial muscle pain 07/06/2016   Osteoarthritis of both hands 07/06/2016   Pre-diabetes    Seasonal allergies 07/06/2016   Sinus complaint    Sjogren's syndrome (HCC)    dry eyes   Tenonitis    Urinary incontinence    occ. an issue wears pads    Surgeries: Procedure(s): ARTHROPLASTY, KNEE, TOTAL on 03/03/2024   Consultants:   Discharged Condition: Improved  Hospital Course: Kathy Duran is an 78 y.o. female who was admitted 03/03/2024 for operative treatment ofS/P total knee arthroplasty, right. Patient has severe unremitting pain that affects sleep, daily activities, and work/hobbies. After pre-op clearance the patient was taken to the operating room on 03/03/2024 and underwent  Procedure(s): ARTHROPLASTY, KNEE, TOTAL.    Patient was given perioperative antibiotics:  Anti-infectives (From admission, onward)    Start     Dose/Rate Route Frequency Ordered Stop   03/03/24 1600  ceFAZolin  (ANCEF ) IVPB 2g/100 mL premix        2 g 200 mL/hr over 30 Minutes Intravenous Every 6 hours 03/03/24 1407 03/03/24 2145   03/03/24 0745  ceFAZolin  (ANCEF ) IVPB 2g/100 mL premix        2 g 200 mL/hr over 30 Minutes Intravenous On call  to O.R. 03/03/24 9260 03/03/24 0958        Patient was given sequential compression devices, early ambulation, and chemoprophylaxis to prevent DVT. Patient worked with PT and was meeting their goals regarding safe ambulation and transfers.  Patient benefited maximally from hospital stay and there were no complications.    Recent vital signs: No data found.   Recent laboratory studies: No results for input(s): WBC, HGB, HCT, PLT, NA, K, CL, CO2, BUN, CREATININE, GLUCOSE, INR, CALCIUM  in the last 72 hours.  Invalid input(s): PT, 2   Discharge Medications:   Allergies as of 03/05/2024       Reactions   Clavulanic Acid    Escitalopram Itching   Sulfur Dioxide Other (See Comments)   Other Reaction(s): Not available   Augmentin  [amoxicillin -pot Clavulanate] Rash   Diclofenac-misoprostol Itching   Arthrotec   Latex Itching   Naproxen Nausea Only   Sulfa Antibiotics Rash        Medication List     TAKE these medications    acetaminophen  650 MG CR tablet Commonly known as: TYLENOL  Take 650 mg by mouth 2 (two) times daily.   albuterol  (2.5 MG/3ML) 0.083% nebulizer solution Commonly known as: PROVENTIL  Take 2.5 mg by nebulization every 4 (four) hours as needed for shortness of breath.   albuterol  108 (90 Base) MCG/ACT inhaler Commonly known as: VENTOLIN  HFA Inhale 1 puff into the  lungs every 4 (four) hours as needed for wheezing or shortness of breath (Asthma).   Aspirin  Low Dose 81 MG chewable tablet Generic drug: aspirin  Chew 1 tablet (81 mg total) by mouth 2 (two) times daily for 28 days.   atorvastatin  20 MG tablet Commonly known as: LIPITOR Take 20 mg by mouth daily.   cetirizine 10 MG tablet Commonly known as: ZYRTEC Take 10 mg by mouth daily as needed for allergies (in th fall).   cyclobenzaprine  5 MG tablet Commonly known as: FLEXERIL  Take 1 tablet (5 mg total) by mouth 3 (three) times daily as needed for muscle spasms.    estradiol 0.1 MG/24HR patch Commonly known as: VIVELLE-DOT Place 1 patch onto the skin 2 (two) times a week. Sundays & Wednesdays.   folic acid  1 MG tablet Commonly known as: FOLVITE  Take 1 mg by mouth daily.   gabapentin  300 MG capsule Commonly known as: NEURONTIN  Take 300 mg by mouth at bedtime as needed (muscle pain/pain).   HAIR SKIN NAILS PO Take 1 tablet by mouth daily.   HYDROmorphone  2 MG tablet Commonly known as: DILAUDID  Take 1 tablet (2 mg total) by mouth every 4 (four) hours as needed for severe pain (pain score 7-10).   losartan  100 MG tablet Commonly known as: COZAAR  Take 50 mg by mouth daily.   methotrexate  2.5 MG tablet Commonly known as: RHEUMATREX Take 20 mg by mouth once a week. Takes 8 tabs weekly   mometasone  50 MCG/ACT nasal spray Commonly known as: NASONEX  Place 2 sprays into the nose daily as needed (Congestion).   multivitamin with minerals Tabs tablet Take 1 tablet by mouth daily.   ondansetron  4 MG tablet Commonly known as: Zofran  Take 1 tablet (4 mg total) by mouth every 8 (eight) hours as needed for nausea or vomiting.   pantoprazole  40 MG tablet Commonly known as: PROTONIX  Take 1 tablet (40 mg total) by mouth daily.   polyethylene glycol powder 17 GM/SCOOP powder Commonly known as: GLYCOLAX /MIRALAX  Take 17 grams dissolved in liquid by mouth 2 (two) times daily.   Remicade  100 MG injection Generic drug: inFLIXimab  Inject 100 mg into the skin every 6 (six) weeks.   Restasis  0.05 % ophthalmic emulsion Generic drug: cycloSPORINE  Place 1 drop into both eyes daily as needed (Dry eyes).   senna 8.6 MG Tabs tablet Commonly known as: SENOKOT Take 2 tablets (17.2 mg total) by mouth at bedtime for 14 days.   Wixela Inhub 250-50 MCG/ACT Aepb Generic drug: fluticasone-salmeterol Inhale 1 puff into the lungs in the morning and at bedtime.   Yuvafem 10 MCG Tabs vaginal tablet Generic drug: Estradiol Place 10 mcg vaginally 2 (two) times  a week. Tuesdays & Thursdays.   zolpidem  10 MG tablet Commonly known as: AMBIEN  Take 10 mg by mouth at bedtime as needed for sleep.               Discharge Care Instructions  (From admission, onward)           Start     Ordered   03/05/24 0000  Change dressing       Comments: Maintain surgical dressing until follow up in the clinic. If the edges start to pull up, may reinforce with tape. If the dressing is no longer working, may remove and cover with gauze and tape, but must keep the area dry and clean.  Call with any questions or concerns.   03/05/24 9071  Diagnostic Studies: No results found.  Disposition: Discharge disposition: 01-Home or Self Care       Discharge Instructions     Call MD / Call 911   Complete by: As directed    If you experience chest pain or shortness of breath, CALL 911 and be transported to the hospital emergency room.  If you develope a fever above 101 F, pus (white drainage) or increased drainage or redness at the wound, or calf pain, call your surgeon's office.   Change dressing   Complete by: As directed    Maintain surgical dressing until follow up in the clinic. If the edges start to pull up, may reinforce with tape. If the dressing is no longer working, may remove and cover with gauze and tape, but must keep the area dry and clean.  Call with any questions or concerns.   Constipation Prevention   Complete by: As directed    Drink plenty of fluids.  Prune juice may be helpful.  You may use a stool softener, such as Colace (over the counter) 100 mg twice a day.  Use MiraLax  (over the counter) for constipation as needed.   Diet - low sodium heart healthy   Complete by: As directed    Increase activity slowly as tolerated   Complete by: As directed    Weight bearing as tolerated with assist device (walker, cane, etc) as directed, use it as long as suggested by your surgeon or therapist, typically at least 4-6 weeks.    Post-operative opioid taper instructions:   Complete by: As directed    POST-OPERATIVE OPIOID TAPER INSTRUCTIONS: It is important to wean off of your opioid medication as soon as possible. If you do not need pain medication after your surgery it is ok to stop day one. Opioids include: Codeine , Hydrocodone (Norco, Vicodin), Oxycodone (Percocet, oxycontin ) and hydromorphone  amongst others.  Long term and even short term use of opiods can cause: Increased pain response Dependence Constipation Depression Respiratory depression And more.  Withdrawal symptoms can include Flu like symptoms Nausea, vomiting And more Techniques to manage these symptoms Hydrate well Eat regular healthy meals Stay active Use relaxation techniques(deep breathing, meditating, yoga) Do Not substitute Alcohol to help with tapering If you have been on opioids for less than two weeks and do not have pain than it is ok to stop all together.  Plan to wean off of opioids This plan should start within one week post op of your joint replacement. Maintain the same interval or time between taking each dose and first decrease the dose.  Cut the total daily intake of opioids by one tablet each day Next start to increase the time between doses. The last dose that should be eliminated is the evening dose.      TED hose   Complete by: As directed    Use stockings (TED hose) for 2 weeks on both leg(s).  You may remove them at night for sleeping.        Follow-up Information     Ernie Cough, MD. Go on 03/18/2024.   Specialty: Orthopedic Surgery Why: You are scheduled for a post op appointment on Wednesday 03/18/24 at 10:45am Contact information: [address] [place] [address] [phone]                  Signed: Rosina JONELLE Duran 03/10/2024, 9:02 AM

## 2024-03-11 DIAGNOSIS — M19041 Primary osteoarthritis, right hand: Secondary | ICD-10-CM | POA: Diagnosis not present

## 2024-03-11 DIAGNOSIS — J45909 Unspecified asthma, uncomplicated: Secondary | ICD-10-CM | POA: Diagnosis not present

## 2024-03-11 DIAGNOSIS — M48061 Spinal stenosis, lumbar region without neurogenic claudication: Secondary | ICD-10-CM | POA: Diagnosis not present

## 2024-03-11 DIAGNOSIS — M19042 Primary osteoarthritis, left hand: Secondary | ICD-10-CM | POA: Diagnosis not present

## 2024-03-11 DIAGNOSIS — Z471 Aftercare following joint replacement surgery: Secondary | ICD-10-CM | POA: Diagnosis not present

## 2024-03-11 DIAGNOSIS — M3500 Sicca syndrome, unspecified: Secondary | ICD-10-CM | POA: Diagnosis not present

## 2024-03-11 DIAGNOSIS — M069 Rheumatoid arthritis, unspecified: Secondary | ICD-10-CM | POA: Diagnosis not present

## 2024-03-11 DIAGNOSIS — I10 Essential (primary) hypertension: Secondary | ICD-10-CM | POA: Diagnosis not present

## 2024-03-11 DIAGNOSIS — Z96653 Presence of artificial knee joint, bilateral: Secondary | ICD-10-CM | POA: Diagnosis not present

## 2024-03-13 DIAGNOSIS — M48061 Spinal stenosis, lumbar region without neurogenic claudication: Secondary | ICD-10-CM | POA: Diagnosis not present

## 2024-03-13 DIAGNOSIS — M3500 Sicca syndrome, unspecified: Secondary | ICD-10-CM | POA: Diagnosis not present

## 2024-03-13 DIAGNOSIS — I10 Essential (primary) hypertension: Secondary | ICD-10-CM | POA: Diagnosis not present

## 2024-03-13 DIAGNOSIS — M069 Rheumatoid arthritis, unspecified: Secondary | ICD-10-CM | POA: Diagnosis not present

## 2024-03-13 DIAGNOSIS — M19041 Primary osteoarthritis, right hand: Secondary | ICD-10-CM | POA: Diagnosis not present

## 2024-03-13 DIAGNOSIS — Z96653 Presence of artificial knee joint, bilateral: Secondary | ICD-10-CM | POA: Diagnosis not present

## 2024-03-13 DIAGNOSIS — M19042 Primary osteoarthritis, left hand: Secondary | ICD-10-CM | POA: Diagnosis not present

## 2024-03-13 DIAGNOSIS — Z471 Aftercare following joint replacement surgery: Secondary | ICD-10-CM | POA: Diagnosis not present

## 2024-03-13 DIAGNOSIS — J45909 Unspecified asthma, uncomplicated: Secondary | ICD-10-CM | POA: Diagnosis not present

## 2024-03-16 DIAGNOSIS — M48061 Spinal stenosis, lumbar region without neurogenic claudication: Secondary | ICD-10-CM | POA: Diagnosis not present

## 2024-03-16 DIAGNOSIS — M069 Rheumatoid arthritis, unspecified: Secondary | ICD-10-CM | POA: Diagnosis not present

## 2024-03-16 DIAGNOSIS — M3500 Sicca syndrome, unspecified: Secondary | ICD-10-CM | POA: Diagnosis not present

## 2024-03-16 DIAGNOSIS — M19041 Primary osteoarthritis, right hand: Secondary | ICD-10-CM | POA: Diagnosis not present

## 2024-03-16 DIAGNOSIS — I10 Essential (primary) hypertension: Secondary | ICD-10-CM | POA: Diagnosis not present

## 2024-03-16 DIAGNOSIS — Z471 Aftercare following joint replacement surgery: Secondary | ICD-10-CM | POA: Diagnosis not present

## 2024-03-16 DIAGNOSIS — M19042 Primary osteoarthritis, left hand: Secondary | ICD-10-CM | POA: Diagnosis not present

## 2024-03-16 DIAGNOSIS — Z96653 Presence of artificial knee joint, bilateral: Secondary | ICD-10-CM | POA: Diagnosis not present

## 2024-03-16 DIAGNOSIS — J45909 Unspecified asthma, uncomplicated: Secondary | ICD-10-CM | POA: Diagnosis not present

## 2024-03-17 DIAGNOSIS — M069 Rheumatoid arthritis, unspecified: Secondary | ICD-10-CM | POA: Diagnosis not present

## 2024-03-17 DIAGNOSIS — M19041 Primary osteoarthritis, right hand: Secondary | ICD-10-CM | POA: Diagnosis not present

## 2024-03-17 DIAGNOSIS — Z471 Aftercare following joint replacement surgery: Secondary | ICD-10-CM | POA: Diagnosis not present

## 2024-03-17 DIAGNOSIS — I10 Essential (primary) hypertension: Secondary | ICD-10-CM | POA: Diagnosis not present

## 2024-03-17 DIAGNOSIS — M3500 Sicca syndrome, unspecified: Secondary | ICD-10-CM | POA: Diagnosis not present

## 2024-03-17 DIAGNOSIS — M48061 Spinal stenosis, lumbar region without neurogenic claudication: Secondary | ICD-10-CM | POA: Diagnosis not present

## 2024-03-17 DIAGNOSIS — Z96653 Presence of artificial knee joint, bilateral: Secondary | ICD-10-CM | POA: Diagnosis not present

## 2024-03-17 DIAGNOSIS — J45909 Unspecified asthma, uncomplicated: Secondary | ICD-10-CM | POA: Diagnosis not present

## 2024-03-17 DIAGNOSIS — M19042 Primary osteoarthritis, left hand: Secondary | ICD-10-CM | POA: Diagnosis not present

## 2024-03-20 DIAGNOSIS — M48061 Spinal stenosis, lumbar region without neurogenic claudication: Secondary | ICD-10-CM | POA: Diagnosis not present

## 2024-03-20 DIAGNOSIS — M19042 Primary osteoarthritis, left hand: Secondary | ICD-10-CM | POA: Diagnosis not present

## 2024-03-20 DIAGNOSIS — M19041 Primary osteoarthritis, right hand: Secondary | ICD-10-CM | POA: Diagnosis not present

## 2024-03-20 DIAGNOSIS — M3500 Sicca syndrome, unspecified: Secondary | ICD-10-CM | POA: Diagnosis not present

## 2024-03-20 DIAGNOSIS — J45909 Unspecified asthma, uncomplicated: Secondary | ICD-10-CM | POA: Diagnosis not present

## 2024-03-20 DIAGNOSIS — I10 Essential (primary) hypertension: Secondary | ICD-10-CM | POA: Diagnosis not present

## 2024-03-20 DIAGNOSIS — M069 Rheumatoid arthritis, unspecified: Secondary | ICD-10-CM | POA: Diagnosis not present

## 2024-03-20 DIAGNOSIS — Z96653 Presence of artificial knee joint, bilateral: Secondary | ICD-10-CM | POA: Diagnosis not present

## 2024-03-20 DIAGNOSIS — Z471 Aftercare following joint replacement surgery: Secondary | ICD-10-CM | POA: Diagnosis not present

## 2024-03-24 DIAGNOSIS — M48061 Spinal stenosis, lumbar region without neurogenic claudication: Secondary | ICD-10-CM | POA: Diagnosis not present

## 2024-03-24 DIAGNOSIS — M19042 Primary osteoarthritis, left hand: Secondary | ICD-10-CM | POA: Diagnosis not present

## 2024-03-24 DIAGNOSIS — I10 Essential (primary) hypertension: Secondary | ICD-10-CM | POA: Diagnosis not present

## 2024-03-24 DIAGNOSIS — M3500 Sicca syndrome, unspecified: Secondary | ICD-10-CM | POA: Diagnosis not present

## 2024-03-24 DIAGNOSIS — J45909 Unspecified asthma, uncomplicated: Secondary | ICD-10-CM | POA: Diagnosis not present

## 2024-03-24 DIAGNOSIS — M19041 Primary osteoarthritis, right hand: Secondary | ICD-10-CM | POA: Diagnosis not present

## 2024-03-24 DIAGNOSIS — Z96653 Presence of artificial knee joint, bilateral: Secondary | ICD-10-CM | POA: Diagnosis not present

## 2024-03-24 DIAGNOSIS — M069 Rheumatoid arthritis, unspecified: Secondary | ICD-10-CM | POA: Diagnosis not present

## 2024-03-24 DIAGNOSIS — Z471 Aftercare following joint replacement surgery: Secondary | ICD-10-CM | POA: Diagnosis not present

## 2024-03-27 DIAGNOSIS — M069 Rheumatoid arthritis, unspecified: Secondary | ICD-10-CM | POA: Diagnosis not present

## 2024-03-27 DIAGNOSIS — M3500 Sicca syndrome, unspecified: Secondary | ICD-10-CM | POA: Diagnosis not present

## 2024-03-27 DIAGNOSIS — M19042 Primary osteoarthritis, left hand: Secondary | ICD-10-CM | POA: Diagnosis not present

## 2024-03-27 DIAGNOSIS — Z96653 Presence of artificial knee joint, bilateral: Secondary | ICD-10-CM | POA: Diagnosis not present

## 2024-03-27 DIAGNOSIS — J45909 Unspecified asthma, uncomplicated: Secondary | ICD-10-CM | POA: Diagnosis not present

## 2024-03-27 DIAGNOSIS — Z471 Aftercare following joint replacement surgery: Secondary | ICD-10-CM | POA: Diagnosis not present

## 2024-03-27 DIAGNOSIS — I10 Essential (primary) hypertension: Secondary | ICD-10-CM | POA: Diagnosis not present

## 2024-03-27 DIAGNOSIS — M48061 Spinal stenosis, lumbar region without neurogenic claudication: Secondary | ICD-10-CM | POA: Diagnosis not present

## 2024-03-27 DIAGNOSIS — M19041 Primary osteoarthritis, right hand: Secondary | ICD-10-CM | POA: Diagnosis not present

## 2024-03-31 DIAGNOSIS — M3500 Sicca syndrome, unspecified: Secondary | ICD-10-CM | POA: Diagnosis not present

## 2024-03-31 DIAGNOSIS — M48061 Spinal stenosis, lumbar region without neurogenic claudication: Secondary | ICD-10-CM | POA: Diagnosis not present

## 2024-03-31 DIAGNOSIS — J45909 Unspecified asthma, uncomplicated: Secondary | ICD-10-CM | POA: Diagnosis not present

## 2024-03-31 DIAGNOSIS — M19041 Primary osteoarthritis, right hand: Secondary | ICD-10-CM | POA: Diagnosis not present

## 2024-03-31 DIAGNOSIS — I10 Essential (primary) hypertension: Secondary | ICD-10-CM | POA: Diagnosis not present

## 2024-03-31 DIAGNOSIS — Z471 Aftercare following joint replacement surgery: Secondary | ICD-10-CM | POA: Diagnosis not present

## 2024-03-31 DIAGNOSIS — Z96653 Presence of artificial knee joint, bilateral: Secondary | ICD-10-CM | POA: Diagnosis not present

## 2024-03-31 DIAGNOSIS — M19042 Primary osteoarthritis, left hand: Secondary | ICD-10-CM | POA: Diagnosis not present

## 2024-03-31 DIAGNOSIS — M069 Rheumatoid arthritis, unspecified: Secondary | ICD-10-CM | POA: Diagnosis not present

## 2024-04-03 DIAGNOSIS — M19042 Primary osteoarthritis, left hand: Secondary | ICD-10-CM | POA: Diagnosis not present

## 2024-04-03 DIAGNOSIS — M19041 Primary osteoarthritis, right hand: Secondary | ICD-10-CM | POA: Diagnosis not present

## 2024-04-03 DIAGNOSIS — M48061 Spinal stenosis, lumbar region without neurogenic claudication: Secondary | ICD-10-CM | POA: Diagnosis not present

## 2024-04-03 DIAGNOSIS — Z471 Aftercare following joint replacement surgery: Secondary | ICD-10-CM | POA: Diagnosis not present

## 2024-04-03 DIAGNOSIS — J45909 Unspecified asthma, uncomplicated: Secondary | ICD-10-CM | POA: Diagnosis not present

## 2024-04-03 DIAGNOSIS — I10 Essential (primary) hypertension: Secondary | ICD-10-CM | POA: Diagnosis not present

## 2024-04-03 DIAGNOSIS — Z96653 Presence of artificial knee joint, bilateral: Secondary | ICD-10-CM | POA: Diagnosis not present

## 2024-04-03 DIAGNOSIS — M069 Rheumatoid arthritis, unspecified: Secondary | ICD-10-CM | POA: Diagnosis not present

## 2024-04-03 DIAGNOSIS — M3500 Sicca syndrome, unspecified: Secondary | ICD-10-CM | POA: Diagnosis not present

## 2024-04-06 DIAGNOSIS — M25561 Pain in right knee: Secondary | ICD-10-CM | POA: Diagnosis not present

## 2024-04-09 DIAGNOSIS — M25561 Pain in right knee: Secondary | ICD-10-CM | POA: Diagnosis not present

## 2024-04-15 DIAGNOSIS — Z5189 Encounter for other specified aftercare: Secondary | ICD-10-CM | POA: Diagnosis not present

## 2024-04-17 DIAGNOSIS — M25561 Pain in right knee: Secondary | ICD-10-CM | POA: Diagnosis not present

## 2024-04-20 DIAGNOSIS — M25561 Pain in right knee: Secondary | ICD-10-CM | POA: Diagnosis not present

## 2024-04-27 DIAGNOSIS — M25561 Pain in right knee: Secondary | ICD-10-CM | POA: Diagnosis not present

## 2024-04-30 DIAGNOSIS — M25561 Pain in right knee: Secondary | ICD-10-CM | POA: Diagnosis not present

## 2024-05-04 DIAGNOSIS — M25561 Pain in right knee: Secondary | ICD-10-CM | POA: Diagnosis not present

## 2024-05-05 DIAGNOSIS — Z96651 Presence of right artificial knee joint: Secondary | ICD-10-CM | POA: Diagnosis not present

## 2024-05-05 DIAGNOSIS — M069 Rheumatoid arthritis, unspecified: Secondary | ICD-10-CM | POA: Diagnosis not present

## 2024-05-05 DIAGNOSIS — I10 Essential (primary) hypertension: Secondary | ICD-10-CM | POA: Diagnosis not present

## 2024-05-05 DIAGNOSIS — E782 Mixed hyperlipidemia: Secondary | ICD-10-CM | POA: Diagnosis not present

## 2024-05-05 DIAGNOSIS — Z23 Encounter for immunization: Secondary | ICD-10-CM | POA: Diagnosis not present

## 2024-05-05 DIAGNOSIS — R7303 Prediabetes: Secondary | ICD-10-CM | POA: Diagnosis not present

## 2024-05-05 DIAGNOSIS — M3500 Sicca syndrome, unspecified: Secondary | ICD-10-CM | POA: Diagnosis not present

## 2024-05-05 DIAGNOSIS — F411 Generalized anxiety disorder: Secondary | ICD-10-CM | POA: Diagnosis not present

## 2024-05-05 DIAGNOSIS — J4540 Moderate persistent asthma, uncomplicated: Secondary | ICD-10-CM | POA: Diagnosis not present

## 2024-05-06 DIAGNOSIS — M25561 Pain in right knee: Secondary | ICD-10-CM | POA: Diagnosis not present

## 2024-05-12 DIAGNOSIS — N952 Postmenopausal atrophic vaginitis: Secondary | ICD-10-CM | POA: Diagnosis not present

## 2024-05-12 DIAGNOSIS — Z79890 Hormone replacement therapy (postmenopausal): Secondary | ICD-10-CM | POA: Diagnosis not present

## 2024-05-13 DIAGNOSIS — M25561 Pain in right knee: Secondary | ICD-10-CM | POA: Diagnosis not present

## 2024-05-13 DIAGNOSIS — M059 Rheumatoid arthritis with rheumatoid factor, unspecified: Secondary | ICD-10-CM | POA: Diagnosis not present

## 2024-05-15 DIAGNOSIS — M25561 Pain in right knee: Secondary | ICD-10-CM | POA: Diagnosis not present

## 2024-05-19 ENCOUNTER — Emergency Department (HOSPITAL_BASED_OUTPATIENT_CLINIC_OR_DEPARTMENT_OTHER)
Admission: EM | Admit: 2024-05-19 | Discharge: 2024-05-19 | Discharge status: Against Medical Advice | Attending: Emergency Medicine | Admitting: Emergency Medicine

## 2024-05-19 ENCOUNTER — Other Ambulatory Visit: Payer: Self-pay

## 2024-05-19 DIAGNOSIS — R030 Elevated blood-pressure reading, without diagnosis of hypertension: Secondary | ICD-10-CM | POA: Diagnosis not present

## 2024-05-19 DIAGNOSIS — R519 Headache, unspecified: Secondary | ICD-10-CM | POA: Diagnosis not present

## 2024-05-19 DIAGNOSIS — E669 Obesity, unspecified: Secondary | ICD-10-CM | POA: Diagnosis not present

## 2024-05-19 DIAGNOSIS — M059 Rheumatoid arthritis with rheumatoid factor, unspecified: Secondary | ICD-10-CM | POA: Diagnosis not present

## 2024-05-19 DIAGNOSIS — Z5321 Procedure and treatment not carried out due to patient leaving prior to being seen by health care provider: Secondary | ICD-10-CM | POA: Insufficient documentation

## 2024-05-19 DIAGNOSIS — I10 Essential (primary) hypertension: Secondary | ICD-10-CM | POA: Diagnosis not present

## 2024-05-19 DIAGNOSIS — M79642 Pain in left hand: Secondary | ICD-10-CM | POA: Diagnosis not present

## 2024-05-19 DIAGNOSIS — M79641 Pain in right hand: Secondary | ICD-10-CM | POA: Diagnosis not present

## 2024-05-19 DIAGNOSIS — Z6834 Body mass index (BMI) 34.0-34.9, adult: Secondary | ICD-10-CM | POA: Diagnosis not present

## 2024-05-19 DIAGNOSIS — Z8739 Personal history of other diseases of the musculoskeletal system and connective tissue: Secondary | ICD-10-CM | POA: Diagnosis not present

## 2024-05-19 NOTE — ED Triage Notes (Signed)
 Report HTN and headache x 1 weeks. No changes to meds for HTN. Sent from PCP.

## 2024-05-20 DIAGNOSIS — I10 Essential (primary) hypertension: Secondary | ICD-10-CM | POA: Diagnosis not present

## 2024-05-20 DIAGNOSIS — R519 Headache, unspecified: Secondary | ICD-10-CM | POA: Diagnosis not present

## 2024-05-21 ENCOUNTER — Other Ambulatory Visit (HOSPITAL_COMMUNITY): Payer: Self-pay | Admitting: Internal Medicine

## 2024-05-21 DIAGNOSIS — R519 Headache, unspecified: Secondary | ICD-10-CM

## 2024-05-22 DIAGNOSIS — Z96652 Presence of left artificial knee joint: Secondary | ICD-10-CM | POA: Diagnosis not present

## 2024-05-22 DIAGNOSIS — Z471 Aftercare following joint replacement surgery: Secondary | ICD-10-CM | POA: Diagnosis not present

## 2024-05-22 DIAGNOSIS — Z96651 Presence of right artificial knee joint: Secondary | ICD-10-CM | POA: Diagnosis not present

## 2024-05-25 ENCOUNTER — Other Ambulatory Visit: Payer: Self-pay | Admitting: Internal Medicine

## 2024-05-25 ENCOUNTER — Ambulatory Visit (HOSPITAL_COMMUNITY)
Admission: RE | Admit: 2024-05-25 | Discharge: 2024-05-25 | Disposition: A | Discharge status: Home / Self Care | Source: Ambulatory Visit | Attending: Internal Medicine | Admitting: Internal Medicine

## 2024-05-25 DIAGNOSIS — R519 Headache, unspecified: Secondary | ICD-10-CM | POA: Insufficient documentation

## 2024-05-25 DIAGNOSIS — Z1231 Encounter for screening mammogram for malignant neoplasm of breast: Secondary | ICD-10-CM

## 2024-06-03 DIAGNOSIS — I10 Essential (primary) hypertension: Secondary | ICD-10-CM | POA: Diagnosis not present

## 2024-06-16 ENCOUNTER — Ambulatory Visit

## 2024-06-16 DIAGNOSIS — Z96652 Presence of left artificial knee joint: Secondary | ICD-10-CM | POA: Diagnosis not present

## 2024-06-23 DIAGNOSIS — M25561 Pain in right knee: Secondary | ICD-10-CM | POA: Diagnosis not present

## 2024-06-23 DIAGNOSIS — M25661 Stiffness of right knee, not elsewhere classified: Secondary | ICD-10-CM | POA: Diagnosis not present

## 2024-06-23 DIAGNOSIS — G8929 Other chronic pain: Secondary | ICD-10-CM | POA: Diagnosis not present

## 2024-06-25 DIAGNOSIS — G8929 Other chronic pain: Secondary | ICD-10-CM | POA: Diagnosis not present

## 2024-06-25 DIAGNOSIS — M25561 Pain in right knee: Secondary | ICD-10-CM | POA: Diagnosis not present

## 2024-06-25 DIAGNOSIS — M25661 Stiffness of right knee, not elsewhere classified: Secondary | ICD-10-CM | POA: Diagnosis not present

## 2024-07-07 ENCOUNTER — Ambulatory Visit
Admission: RE | Admit: 2024-07-07 | Discharge: 2024-07-07 | Disposition: A | Source: Ambulatory Visit | Attending: Internal Medicine | Admitting: Internal Medicine

## 2024-07-07 DIAGNOSIS — Z1231 Encounter for screening mammogram for malignant neoplasm of breast: Secondary | ICD-10-CM

## 2024-07-13 ENCOUNTER — Other Ambulatory Visit: Payer: Self-pay | Admitting: Internal Medicine

## 2024-07-13 DIAGNOSIS — R928 Other abnormal and inconclusive findings on diagnostic imaging of breast: Secondary | ICD-10-CM

## 2024-07-29 ENCOUNTER — Ambulatory Visit
Admission: RE | Admit: 2024-07-29 | Discharge: 2024-07-29 | Disposition: A | Discharge status: Home / Self Care | Source: Ambulatory Visit | Attending: Internal Medicine | Admitting: Internal Medicine

## 2024-07-29 DIAGNOSIS — R928 Other abnormal and inconclusive findings on diagnostic imaging of breast: Secondary | ICD-10-CM
